# Patient Record
Sex: Female | Born: 1975 | Race: Black or African American | Hispanic: No | Marital: Single | Smoking: Never smoker
Health system: Southern US, Community
[De-identification: ages and names within clinical notes are randomized; demographics above are authoritative.]

## PROBLEM LIST (undated history)

## (undated) DIAGNOSIS — G4730 Sleep apnea, unspecified: Secondary | ICD-10-CM

## (undated) DIAGNOSIS — H05119 Granuloma of unspecified orbit: Secondary | ICD-10-CM

## (undated) DIAGNOSIS — R87629 Unspecified abnormal cytological findings in specimens from vagina: Secondary | ICD-10-CM

## (undated) DIAGNOSIS — I10 Essential (primary) hypertension: Secondary | ICD-10-CM

## (undated) DIAGNOSIS — T7840XA Allergy, unspecified, initial encounter: Secondary | ICD-10-CM

## (undated) DIAGNOSIS — H547 Unspecified visual loss: Secondary | ICD-10-CM

## (undated) DIAGNOSIS — G43909 Migraine, unspecified, not intractable, without status migrainosus: Secondary | ICD-10-CM

## (undated) HISTORY — PX: TRACHEOSTOMY: SUR1362

## (undated) HISTORY — DX: Migraine, unspecified, not intractable, without status migrainosus: G43.909

## (undated) HISTORY — DX: Allergy, unspecified, initial encounter: T78.40XA

## (undated) HISTORY — DX: Granuloma of unspecified orbit: H05.119

## (undated) HISTORY — PX: CSF SHUNT: SHX92

## (undated) HISTORY — PX: BRAIN SURGERY: SHX531

## (undated) HISTORY — DX: Unspecified abnormal cytological findings in specimens from vagina: R87.629

## (undated) HISTORY — DX: Essential (primary) hypertension: I10

## (undated) HISTORY — DX: Unspecified visual loss: H54.7

## (undated) HISTORY — DX: Sleep apnea, unspecified: G47.30

---

## 2015-05-15 DIAGNOSIS — G932 Benign intracranial hypertension: Secondary | ICD-10-CM | POA: Insufficient documentation

## 2015-08-14 DIAGNOSIS — H5440 Blindness, one eye, unspecified eye: Secondary | ICD-10-CM | POA: Insufficient documentation

## 2015-09-21 DIAGNOSIS — G932 Benign intracranial hypertension: Secondary | ICD-10-CM | POA: Insufficient documentation

## 2015-10-25 DIAGNOSIS — Z982 Presence of cerebrospinal fluid drainage device: Secondary | ICD-10-CM | POA: Insufficient documentation

## 2015-11-21 ENCOUNTER — Ambulatory Visit (INDEPENDENT_AMBULATORY_CARE_PROVIDER_SITE_OTHER): Payer: Self-pay | Admitting: Physician Assistant

## 2015-11-21 ENCOUNTER — Encounter: Payer: Self-pay | Admitting: Physician Assistant

## 2015-11-21 VITALS — BP 138/90 | HR 71 | Temp 97.0°F | Ht 70.0 in | Wt 362.1 lb

## 2015-11-21 DIAGNOSIS — J309 Allergic rhinitis, unspecified: Secondary | ICD-10-CM

## 2015-11-21 DIAGNOSIS — J0100 Acute maxillary sinusitis, unspecified: Secondary | ICD-10-CM

## 2015-11-21 DIAGNOSIS — G932 Benign intracranial hypertension: Secondary | ICD-10-CM

## 2015-11-21 DIAGNOSIS — Z982 Presence of cerebrospinal fluid drainage device: Secondary | ICD-10-CM

## 2015-11-21 DIAGNOSIS — G43809 Other migraine, not intractable, without status migrainosus: Secondary | ICD-10-CM

## 2015-11-21 DIAGNOSIS — Z6841 Body Mass Index (BMI) 40.0 and over, adult: Secondary | ICD-10-CM

## 2015-11-21 DIAGNOSIS — I10 Essential (primary) hypertension: Secondary | ICD-10-CM

## 2015-11-21 MED ORDER — PROPRANOLOL HCL ER BEADS 120 MG PO CP24: 120.0000 mg | ORAL_CAPSULE | Freq: Every day | ORAL | 11 refills | Status: DC

## 2015-11-21 MED ORDER — AMITRIPTYLINE HCL 50 MG PO TABS: 50.0000 mg | ORAL_TABLET | Freq: Every day | ORAL | 11 refills | Status: DC

## 2015-11-21 MED ORDER — AMOXICILLIN 500 MG PO CAPS: 1000.0000 mg | ORAL_CAPSULE | Freq: Two times a day (BID) | ORAL | 0 refills | Status: DC

## 2015-11-21 NOTE — Progress Notes (Signed)
Oct 4 surg and neuro   BP 138/90   Pulse 71   Temp 97 F (36.1 C) (Oral)   Ht 5\' 10"  (1.778 m)   Wt (!) 362 lb 2 oz (164.3 kg)   BMI 51.96 kg/m    Subjective:    Patient ID: Vanessa [name], female    DOB: [birth year], 40 y.o.   MRN: [id number]  Vanessa [name] is a 40 y.o. female presenting on 11/21/2015 for Medication refills and Discuss recent surgery (shunt placement)  HPI Patient here to be established as new patient at [facility].  This patient is known to me from [facility].  She returns to the neurosurgeon and neurologist on October 4 for postop visit. Shunt was place October 24, 2015. Feeling much better overall. Blood pressure was very good upon leaving the hospital.  Her headaches have decreased in number and in severity. She states that she still has some discomfort over the healing lesions on her scalp from the surgery.   She is having pain over the left maxillary sinus N/A. She had a tooth removed several weeks ago the pain had improved. She is having a blocked feeling in that side and up to her ear. She denies any other fever chills nausea vomiting. She denies any cough or wheezing. She has no nausea vomiting or diarrhea.  All of her medical conditions and medications are reviewed today and brought up-to-date. We'll refill anything that she needs refilling at this time. There are no other complaints  Relevant past medical, surgical, family and social history reviewed and updated as indicated. Interim medical history since our last visit reviewed. Allergies and medications reviewed and updated.   Data reviewed from any sources in EPIC.  Review of Systems  Constitutional: Negative.  Negative for activity change, fatigue and fever.  HENT: Positive for facial swelling, sinus pressure and sore throat.   Eyes: Negative.   Respiratory: Negative.  Negative for cough.   Cardiovascular: Negative.  Negative for chest pain.  Gastrointestinal:  Negative.  Negative for abdominal pain.  Endocrine: Negative.   Genitourinary: Negative.  Negative for dysuria.  Musculoskeletal: Negative.   Skin: Negative.   Neurological: Negative.     Per HPI unless specifically indicated above  Social History   Social History  . Marital status: Single    Spouse name: N/A  . Number of children: N/A  . Years of education: N/A   Occupational History  . Not on file.   Social History Main Topics  . Smoking status: Never Smoker  . Smokeless tobacco: Never Used  . Alcohol use No  . Drug use: No  . Sexual activity: Not on file   Other Topics Concern  . Not on file   Social History Narrative  . No narrative on file    Past Surgical History:  Procedure Laterality Date  . CSF SHUNT      Family History  Problem Relation Age of Onset  . Pulmonary Hypertension Mother   . Congestive Heart Failure Mother   . Hypertension Sister       Medication List       Accurate as of 11/21/15  3:15 PM. Always use your most recent med list.          acetaZOLAMIDE 250 MG tablet Commonly known as:  DIAMOX Take 750 mg by mouth 2 (two) times daily.   amitriptyline 50 MG tablet Commonly known as:  ELAVIL Take 1 tablet (50 mg total) by mouth daily.  amLODipine 10 MG tablet Commonly known as:  NORVASC Take 10 mg by mouth daily.   amoxicillin 500 MG capsule Commonly known as:  AMOXIL Take 2 capsules (1,000 mg total) by mouth 2 (two) times daily.   diclofenac 50 MG tablet Commonly known as:  CATAFLAM Take 50 mg by mouth 3 (three) times daily as needed.   gabapentin 300 MG capsule Commonly known as:  NEURONTIN Take 300 mg by mouth at bedtime.   lisinopril 40 MG tablet Commonly known as:  PRINIVIL,ZESTRIL Take 40 mg by mouth daily.   loratadine 10 MG tablet Commonly known as:  CLARITIN Take 10 mg by mouth daily.   propranolol 120 MG 24 hr capsule Commonly known as:  INNOPRAN XL Take 1 capsule (120 mg total) by mouth daily.     zaleplon 5 MG capsule Commonly known as:  SONATA Take 5 mg by mouth at bedtime as needed for sleep.          Objective:    BP 138/90   Pulse 71   Temp 97 F (36.1 C) (Oral)   Ht 5\' 10"  (1.778 m)   Wt (!) 362 lb 2 oz (164.3 kg)   BMI 51.96 kg/m   Allergies  Allergen Reactions  . Peanut Oil Anaphylaxis and Other (See Comments)    update  . Iodine-131 Other (See Comments)    update   Wt Readings from Last 3 Encounters:  11/21/15 (!) 362 lb 2 oz (164.3 kg)    Physical Exam  Constitutional: She is oriented to person, place, and time. She appears well-developed and well-nourished.  HENT:  Head: Normocephalic and atraumatic.  Right Ear: Tympanic membrane and external ear normal. No middle ear effusion.  Left Ear: Tympanic membrane and external ear normal.  No middle ear effusion.  Nose: Mucosal edema present. No rhinorrhea. Right sinus exhibits no maxillary sinus tenderness. Left sinus exhibits maxillary sinus tenderness.  Mouth/Throat: Uvula is midline. Posterior oropharyngeal erythema present.  Eyes: Conjunctivae and EOM are normal. Pupils are equal, round, and reactive to light. Right eye exhibits no discharge. Left eye exhibits no discharge.  Neck: Normal range of motion.  Cardiovascular: Normal rate, regular rhythm and normal heart sounds.   Pulmonary/Chest: Effort normal and breath sounds normal. No respiratory distress. She has no wheezes.  Abdominal: Soft.  Lymphadenopathy:    She has no cervical adenopathy.  Neurological: She is alert and oriented to person, place, and time.  Skin: Skin is warm and dry.  Psychiatric: She has a normal mood and affect.  Nursing note and vitals reviewed.       Assessment & Plan:   1. IIH (idiopathic intracranial hypertension) Continue to care with her neurologist and neurosurgeon.  2. S/P VP shunt  3. Essential hypertension DASH diet information is given. Continue all maintenance medications  4. Allergic rhinitis,  unspecified allergic rhinitis type  5. Acute maxillary sinusitis, recurrence not specified - amoxicillin (AMOXIL) 500 MG capsule; Take 2 capsules (1,000 mg total) by mouth 2 (two) times daily.  Dispense: 40 capsule; Refill: 0  6. Other migraine without status migrainosus, not intractable - propranolol (INNOPRAN XL) 120 MG 24 hr capsule; Take 1 capsule (120 mg total) by mouth daily.  Dispense: 30 capsule; Refill: 11 - amitriptyline (ELAVIL) 50 MG tablet; Take 1 tablet (50 mg total) by mouth daily.  Dispense: 30 tablet; Refill: 11  7. Morbid obesity with BMI of 50.0-59.9, adult (HCC) DASH diet information is given   Continue all other maintenance medications  as listed above. Educational handout given for DASH diet  Follow up plan: Return in about 3 months (around 02/20/2016) for recheck.  Terald Sleeper PA-C [facility] [address]  [place], [place] [address] [phone]   11/21/2015, 3:15 PM

## 2015-11-21 NOTE — Patient Instructions (Signed)
DASH Eating Plan  DASH stands for "Dietary Approaches to Stop Hypertension." The DASH eating plan is a healthy eating plan that has been shown to reduce high blood pressure (hypertension). Additional health benefits may include reducing the risk of type 2 diabetes mellitus, heart disease, and stroke. The DASH eating plan may also help with weight loss.  WHAT DO I NEED TO KNOW ABOUT THE DASH EATING PLAN?  For the DASH eating plan, you will follow these general guidelines:  · Choose foods with a percent daily value for sodium of less than 5% (as listed on the food label).  · Use salt-free seasonings or herbs instead of table salt or sea salt.  · Check with your health care provider or pharmacist before using salt substitutes.  · Eat lower-sodium products, often labeled as "lower sodium" or "no salt added."  · Eat fresh foods.  · Eat more vegetables, fruits, and low-fat dairy products.  · Choose whole grains. Look for the word "whole" as the first word in the ingredient list.  · Choose fish and skinless chicken or turkey more often than red meat. Limit fish, poultry, and meat to 6 oz (170 g) each day.  · Limit sweets, desserts, sugars, and sugary drinks.  · Choose heart-healthy fats.  · Limit cheese to 1 oz (28 g) per day.  · Eat more home-cooked food and less restaurant, buffet, and fast food.  · Limit fried foods.  · Cook foods using methods other than frying.  · Limit canned vegetables. If you do use them, rinse them well to decrease the sodium.  · When eating at a restaurant, ask that your food be prepared with less salt, or no salt if possible.  WHAT FOODS CAN I EAT?  Seek help from a dietitian for individual calorie needs.  Grains  Whole grain or whole wheat bread. Brown rice. Whole grain or whole wheat pasta. Quinoa, bulgur, and whole grain cereals. Low-sodium cereals. Corn or whole wheat flour tortillas. Whole grain cornbread. Whole grain crackers. Low-sodium crackers.  Vegetables  Fresh or frozen vegetables  (raw, steamed, roasted, or grilled). Low-sodium or reduced-sodium tomato and vegetable juices. Low-sodium or reduced-sodium tomato sauce and paste. Low-sodium or reduced-sodium canned vegetables.   Fruits  All fresh, canned (in natural juice), or frozen fruits.  Meat and Other Protein Products  Ground beef (85% or leaner), grass-fed beef, or beef trimmed of fat. Skinless chicken or turkey. Ground chicken or turkey. Pork trimmed of fat. All fish and seafood. Eggs. Dried beans, peas, or lentils. Unsalted nuts and seeds. Unsalted canned beans.  Dairy  Low-fat dairy products, such as skim or 1% milk, 2% or reduced-fat cheeses, low-fat ricotta or cottage cheese, or plain low-fat yogurt. Low-sodium or reduced-sodium cheeses.  Fats and Oils  Tub margarines without trans fats. Light or reduced-fat mayonnaise and salad dressings (reduced sodium). Avocado. Safflower, olive, or canola oils. Natural peanut or almond butter.  Other  Unsalted popcorn and pretzels.  The items listed above may not be a complete list of recommended foods or beverages. Contact your dietitian for more options.  WHAT FOODS ARE NOT RECOMMENDED?  Grains  White bread. White pasta. White rice. Refined cornbread. Bagels and croissants. Crackers that contain trans fat.  Vegetables  Creamed or fried vegetables. Vegetables in a cheese sauce. Regular canned vegetables. Regular canned tomato sauce and paste. Regular tomato and vegetable juices.  Fruits  Dried fruits. Canned fruit in light or heavy syrup. Fruit juice.  Meat and Other Protein   Products  Fatty cuts of meat. Ribs, chicken wings, bacon, sausage, bologna, salami, chitterlings, fatback, hot dogs, bratwurst, and packaged luncheon meats. Salted nuts and seeds. Canned beans with salt.  Dairy  Whole or 2% milk, cream, half-and-half, and cream cheese. Whole-fat or sweetened yogurt. Full-fat cheeses or blue cheese. Nondairy creamers and whipped toppings. Processed cheese, cheese spreads, or cheese  curds.  Condiments  Onion and garlic salt, seasoned salt, table salt, and sea salt. Canned and packaged gravies. Worcestershire sauce. Tartar sauce. Barbecue sauce. Teriyaki sauce. Soy sauce, including reduced sodium. Steak sauce. Fish sauce. Oyster sauce. Cocktail sauce. Horseradish. Ketchup and mustard. Meat flavorings and tenderizers. Bouillon cubes. Hot sauce. Tabasco sauce. Marinades. Taco seasonings. Relishes.  Fats and Oils  Butter, stick margarine, lard, shortening, ghee, and bacon fat. Coconut, palm kernel, or palm oils. Regular salad dressings.  Other  Pickles and olives. Salted popcorn and pretzels.  The items listed above may not be a complete list of foods and beverages to avoid. Contact your dietitian for more information.  WHERE CAN I FIND MORE INFORMATION?  National Heart, Lung, and Blood Institute: www.nhlbi.nih.gov/health/health-topics/topics/dash/     This information is not intended to replace advice given to you by your health care provider. Make sure you discuss any questions you have with your health care provider.     Document Released: 01/31/2011 Document Revised: 03/04/2014 Document Reviewed: 12/16/2012  Elsevier Interactive Patient Education ©2016 Elsevier Inc.

## 2015-12-05 ENCOUNTER — Telehealth: Payer: Self-pay | Admitting: Physician Assistant

## 2015-12-05 DIAGNOSIS — J0100 Acute maxillary sinusitis, unspecified: Secondary | ICD-10-CM

## 2015-12-05 MED ORDER — AMOXICILLIN 500 MG PO CAPS: 1000.0000 mg | ORAL_CAPSULE | Freq: Two times a day (BID) | ORAL | 0 refills | Status: DC

## 2015-12-05 NOTE — Telephone Encounter (Signed)
Patient wants to know if there is something she can take for the cough along with amoxicillin. Patient states she has tried Robitussin and mucinex and it has not helped. Please advise

## 2015-12-05 NOTE — Telephone Encounter (Signed)
Amoxicillin 500 mg 2 tabs po BID 10 days, #40.

## 2015-12-05 NOTE — Telephone Encounter (Signed)
Patient aware that Rx has been sent to pharmacy 

## 2015-12-05 NOTE — Telephone Encounter (Signed)
Patient called with complaints of cough, nasal congestion and dyspnea.  Patient has taken robitussin, dayquil and Nyquil with no relief.  Patient would like for something to be sent to pharmacy in [facility]

## 2016-02-21 ENCOUNTER — Ambulatory Visit: Payer: Self-pay | Admitting: Physician Assistant

## 2016-02-28 ENCOUNTER — Ambulatory Visit: Payer: Self-pay | Admitting: Physician Assistant

## 2016-03-04 ENCOUNTER — Encounter: Payer: Self-pay | Admitting: Physician Assistant

## 2016-03-04 ENCOUNTER — Ambulatory Visit (INDEPENDENT_AMBULATORY_CARE_PROVIDER_SITE_OTHER): Payer: Self-pay | Admitting: Physician Assistant

## 2016-03-04 VITALS — BP 130/79 | HR 87 | Temp 98.0°F | Ht 70.0 in | Wt 347.0 lb

## 2016-03-04 DIAGNOSIS — E282 Polycystic ovarian syndrome: Secondary | ICD-10-CM | POA: Insufficient documentation

## 2016-03-04 DIAGNOSIS — G4459 Other complicated headache syndrome: Secondary | ICD-10-CM

## 2016-03-04 DIAGNOSIS — I10 Essential (primary) hypertension: Secondary | ICD-10-CM | POA: Insufficient documentation

## 2016-03-04 DIAGNOSIS — E1159 Type 2 diabetes mellitus with other circulatory complications: Secondary | ICD-10-CM | POA: Insufficient documentation

## 2016-03-04 DIAGNOSIS — G932 Benign intracranial hypertension: Secondary | ICD-10-CM

## 2016-03-04 MED ORDER — OXYCODONE HCL 5 MG PO TABS: 5.0000 mg | ORAL_TABLET | Freq: Four times a day (QID) | ORAL | 0 refills | Status: AC | PRN

## 2016-03-04 MED ORDER — GABAPENTIN 300 MG PO CAPS: 300.0000 mg | ORAL_CAPSULE | Freq: Every day | ORAL | 11 refills | Status: DC

## 2016-03-04 MED ORDER — AMLODIPINE BESYLATE 10 MG PO TABS: 10.0000 mg | ORAL_TABLET | Freq: Every day | ORAL | 11 refills | Status: DC

## 2016-03-04 MED ORDER — METFORMIN HCL 500 MG PO TABS: 500.0000 mg | ORAL_TABLET | Freq: Two times a day (BID) | ORAL | 11 refills | Status: DC

## 2016-03-04 MED ORDER — LORATADINE 10 MG PO TABS: 10.0000 mg | ORAL_TABLET | Freq: Every day | ORAL | 11 refills | Status: DC

## 2016-03-04 NOTE — Patient Instructions (Signed)
DASH Eating Plan DASH stands for "Dietary Approaches to Stop Hypertension." The DASH eating plan is a healthy eating plan that has been shown to reduce high blood pressure (hypertension). Additional health benefits may include reducing the risk of type 2 diabetes mellitus, heart disease, and stroke. The DASH eating plan may also help with weight loss. What do I need to know about the DASH eating plan? For the DASH eating plan, you will follow these general guidelines:  Choose foods with less than 150 milligrams of sodium per serving (as listed on the food label).  Use salt-free seasonings or herbs instead of table salt or sea salt.  Check with your health care provider or pharmacist before using salt substitutes.  Eat lower-sodium products. These are often labeled as "low-sodium" or "no salt added."  Eat fresh foods. Avoid eating a lot of canned foods.  Eat more vegetables, fruits, and low-fat dairy products.  Choose whole grains. Look for the word "whole" as the first word in the ingredient list.  Choose fish and skinless chicken or turkey more often than red meat. Limit fish, poultry, and meat to 6 oz (170 g) each day.  Limit sweets, desserts, sugars, and sugary drinks.  Choose heart-healthy fats.  Eat more home-cooked food and less restaurant, buffet, and fast food.  Limit fried foods.  Do not fry foods. Cook foods using methods such as baking, boiling, grilling, and broiling instead.  When eating at a restaurant, ask that your food be prepared with less salt, or no salt if possible. What foods can I eat? Seek help from a dietitian for individual calorie needs. Grains  Whole grain or whole wheat bread. Brown rice. Whole grain or whole wheat pasta. Quinoa, bulgur, and whole grain cereals. Low-sodium cereals. Corn or whole wheat flour tortillas. Whole grain cornbread. Whole grain crackers. Low-sodium crackers. Vegetables  Fresh or frozen vegetables (raw, steamed, roasted, or  grilled). Low-sodium or reduced-sodium tomato and vegetable juices. Low-sodium or reduced-sodium tomato sauce and paste. Low-sodium or reduced-sodium canned vegetables. Fruits  All fresh, canned (in natural juice), or frozen fruits. Meat and Other Protein Products  Ground beef (85% or leaner), grass-fed beef, or beef trimmed of fat. Skinless chicken or turkey. Ground chicken or turkey. Pork trimmed of fat. All fish and seafood. Eggs. Dried beans, peas, or lentils. Unsalted nuts and seeds. Unsalted canned beans. Dairy  Low-fat dairy products, such as skim or 1% milk, 2% or reduced-fat cheeses, low-fat ricotta or cottage cheese, or plain low-fat yogurt. Low-sodium or reduced-sodium cheeses. Fats and Oils  Tub margarines without trans fats. Light or reduced-fat mayonnaise and salad dressings (reduced sodium). Avocado. Safflower, olive, or canola oils. Natural peanut or almond butter. Other  Unsalted popcorn and pretzels. The items listed above may not be a complete list of recommended foods or beverages. Contact your dietitian for more options.  What foods are not recommended? Grains  White bread. White pasta. White rice. Refined cornbread. Bagels and croissants. Crackers that contain trans fat. Vegetables  Creamed or fried vegetables. Vegetables in a cheese sauce. Regular canned vegetables. Regular canned tomato sauce and paste. Regular tomato and vegetable juices. Fruits  Canned fruit in light or heavy syrup. Fruit juice. Meat and Other Protein Products  Fatty cuts of meat. Ribs, chicken wings, bacon, sausage, bologna, salami, chitterlings, fatback, hot dogs, bratwurst, and packaged luncheon meats. Salted nuts and seeds. Canned beans with salt. Dairy  Whole or 2% milk, cream, half-and-half, and cream cheese. Whole-fat or sweetened yogurt. Full-fat cheeses   or blue cheese. Nondairy creamers and whipped toppings. Processed cheese, cheese spreads, or cheese curds. Condiments  Onion and garlic  salt, seasoned salt, table salt, and sea salt. Canned and packaged gravies. Worcestershire sauce. Tartar sauce. Barbecue sauce. Teriyaki sauce. Soy sauce, including reduced sodium. Steak sauce. Fish sauce. Oyster sauce. Cocktail sauce. Horseradish. Ketchup and mustard. Meat flavorings and tenderizers. Bouillon cubes. Hot sauce. Tabasco sauce. Marinades. Taco seasonings. Relishes. Fats and Oils  Butter, stick margarine, lard, shortening, ghee, and bacon fat. Coconut, palm kernel, or palm oils. Regular salad dressings. Other  Pickles and olives. Salted popcorn and pretzels. The items listed above may not be a complete list of foods and beverages to avoid. Contact your dietitian for more information.  Where can I find more information? National Heart, Lung, and Blood Institute: www.nhlbi.nih.gov/health/health-topics/topics/dash/ This information is not intended to replace advice given to you by your health care provider. Make sure you discuss any questions you have with your health care provider. Document Released: 01/31/2011 Document Revised: 07/20/2015 Document Reviewed: 12/16/2012 Elsevier Interactive Patient Education  2017 Elsevier Inc.  

## 2016-03-04 NOTE — Progress Notes (Signed)
BP 130/79   Pulse 87   Temp 98 F (36.7 C) (Oral)   Ht 5\' 10"  (1.778 m)   Wt (!) 347 lb (157.4 kg)   BMI 49.79 kg/m    Subjective:    Patient ID: Vanessa [name], female    DOB: Dec 08, 1975, 41 y.o.   MRN: [id number]  HPI: Vanessa [name] is a 41 y.o. female presenting on 03/04/2016 for Follow-up (Since seen last in hospital twice )  This patient comes in for periodic recheck on medications and conditions. All medications are reviewed today. There are no reports of any problems with the medications. All of the medical conditions are reviewed and updated.  Lab work is reviewed and will be ordered as medically necessary.   Patient developed a Staphylococcus infection in the shunt from her head. She has this in place for her pseudotumor cerebri and extra fluid.  She had to be hospitalized for it and is still on Ancef through a PICC line. She has 1 more dose to take. After this is commonly plan to replace the shunt. She'll be seen her neurologist and neurosurgeon in coming weeks. All of her medications are reviewed today.  Relevant past medical, surgical, family and social history reviewed and updated as indicated. Allergies and medications reviewed and updated.  Past Medical History:  Diagnosis Date  . Hypertension   . Migraines   . Pseudotumor (inflammatory) of orbit   . Vision loss     Past Surgical History:  Procedure Laterality Date  . CSF SHUNT      Review of Systems  Constitutional: Positive for fatigue. Negative for activity change and fever.  HENT: Negative.   Eyes: Negative.   Respiratory: Negative.  Negative for cough.   Cardiovascular: Negative.  Negative for chest pain.  Gastrointestinal: Negative.  Negative for abdominal pain.  Endocrine: Negative.   Genitourinary: Negative.  Negative for dysuria.  Musculoskeletal: Negative.   Skin: Negative.   Neurological: Positive for headaches. Negative for weakness.  Hematological: Negative.   Psychiatric/Behavioral:  Negative.     Allergies as of 03/04/2016      Reactions   Metrizamide Other (See Comments), Itching, Photosensitivity, Shortness Of Breath, Swelling   update Migraine instantly   Peanut Oil Anaphylaxis, Other (See Comments)   update   Iodine-131 Other (See Comments)   update      Medication List       Accurate as of 03/04/16  9:24 PM. Always use your most recent med list.          acetaZOLAMIDE 250 MG tablet Commonly known as:  DIAMOX Take 750 mg by mouth 2 (two) times daily.   amitriptyline 50 MG tablet Commonly known as:  ELAVIL Take 1 tablet (50 mg total) by mouth daily.   amLODipine 10 MG tablet Commonly known as:  NORVASC Take 1 tablet (10 mg total) by mouth daily.   ceFAZolin 1 g injection Commonly known as:  ANCEF Inject into the vein.   diphenhydrAMINE 25 mg capsule Commonly known as:  BENADRYL Take by mouth.   gabapentin 300 MG capsule Commonly known as:  NEURONTIN Take 1 capsule (300 mg total) by mouth at bedtime.   lisinopril 40 MG tablet Commonly known as:  PRINIVIL,ZESTRIL Take 40 mg by mouth daily.   loratadine 10 MG tablet Commonly known as:  CLARITIN Take 1 tablet (10 mg total) by mouth daily.   metFORMIN 500 MG tablet Commonly known as:  GLUCOPHAGE Take 1 tablet (500 mg total) by mouth  2 (two) times daily with a meal.   ondansetron 4 MG tablet Commonly known as:  ZOFRAN Take by mouth.   oxyCODONE 5 MG immediate release tablet Commonly known as:  Oxy IR/ROXICODONE Take 1 tablet (5 mg total) by mouth every 6 (six) hours as needed for severe pain.   zaleplon 5 MG capsule Commonly known as:  SONATA Take 5 mg by mouth at bedtime as needed for sleep.          Objective:    BP 130/79   Pulse 87   Temp 98 F (36.7 C) (Oral)   Ht 5\' 10"  (1.778 m)   Wt (!) 347 lb (157.4 kg)   BMI 49.79 kg/m   Allergies  Allergen Reactions  . Metrizamide Other (See Comments), Itching, Photosensitivity, Shortness Of Breath and Swelling     update Migraine instantly  . Peanut Oil Anaphylaxis and Other (See Comments)    update  . Iodine-131 Other (See Comments)    update    Physical Exam  Constitutional: She is oriented to person, place, and time. She appears well-developed and well-nourished.  HENT:  Head: Normocephalic and atraumatic.  Right Ear: Tympanic membrane, external ear and ear canal normal.  Left Ear: Tympanic membrane, external ear and ear canal normal.  Nose: Nose normal. No rhinorrhea.  Mouth/Throat: Oropharynx is clear and moist and mucous membranes are normal. No oropharyngeal exudate or posterior oropharyngeal erythema.  Eyes: Conjunctivae and EOM are normal. Pupils are equal, round, and reactive to light.  Neck: Normal range of motion. Neck supple.  Cardiovascular: Normal rate, regular rhythm, normal heart sounds and intact distal pulses.   Pulmonary/Chest: Effort normal and breath sounds normal.  Abdominal: Soft. Bowel sounds are normal.  Neurological: She is alert and oriented to person, place, and time. She has normal reflexes.  Skin: Skin is warm and dry. No rash noted.  Psychiatric: She has a normal mood and affect. Her behavior is normal. Judgment and thought content normal.  Nursing note and vitals reviewed.   No results found for this or any previous visit.    Assessment & Plan:   1. IIH (idiopathic intracranial hypertension)  2. Pseudotumor cerebri  3. Morbid obesity ([place])  4. Essential hypertension - amLODipine (NORVASC) 10 MG tablet; Take 1 tablet (10 mg total) by mouth daily.  Dispense: 30 tablet; Refill: 11  5. Other complicated headache syndrome - ceFAZolin (ANCEF) 1 g injection; Inject into the vein. - ondansetron (ZOFRAN) 4 MG tablet; Take by mouth. - gabapentin (NEURONTIN) 300 MG capsule; Take 1 capsule (300 mg total) by mouth at bedtime.  Dispense: 30 capsule; Refill: 11 - oxyCODONE (OXY IR/ROXICODONE) 5 MG immediate release tablet; Take 1 tablet (5 mg total) by mouth every  6 (six) hours as needed for severe pain.  Dispense: 75 tablet; Refill: 0  6. PCOS (polycystic ovarian syndrome) - metFORMIN (GLUCOPHAGE) 500 MG tablet; Take 1 tablet (500 mg total) by mouth 2 (two) times daily with a meal.  Dispense: 60 tablet; Refill: 11   Continue all other maintenance medications as listed above.  Follow up plan: Return in about 3 months (around 06/02/2016) for recheck.  No orders of the defined types were placed in this encounter.   Educational handout given for migraine  Terald Sleeper PA-C [facility] [address]  [place], [place] [address] [phone]   03/04/2016, 9:24 PM

## 2016-03-19 ENCOUNTER — Ambulatory Visit: Payer: Self-pay | Admitting: Physician Assistant

## 2016-04-24 ENCOUNTER — Ambulatory Visit (INDEPENDENT_AMBULATORY_CARE_PROVIDER_SITE_OTHER): Payer: Self-pay | Admitting: Physician Assistant

## 2016-04-24 ENCOUNTER — Encounter: Payer: Self-pay | Admitting: Physician Assistant

## 2016-04-24 VITALS — BP 152/88 | HR 88 | Temp 97.7°F | Ht 70.0 in | Wt 336.4 lb

## 2016-04-24 DIAGNOSIS — K047 Periapical abscess without sinus: Secondary | ICD-10-CM

## 2016-04-24 DIAGNOSIS — I10 Essential (primary) hypertension: Secondary | ICD-10-CM

## 2016-04-24 DIAGNOSIS — Z930 Tracheostomy status: Secondary | ICD-10-CM

## 2016-04-24 DIAGNOSIS — L03211 Cellulitis of face: Secondary | ICD-10-CM

## 2016-04-24 MED ORDER — OXYCODONE HCL 5 MG PO TABS: 5.0000 mg | ORAL_TABLET | Freq: Four times a day (QID) | ORAL | 0 refills | Status: DC | PRN

## 2016-04-24 NOTE — Patient Instructions (Addendum)
How to Minimize Scarring After Surgery Scarring is a risk of any surgery that involves cutting the skin (an incision). However, every person scars differently. Factors that affect how you scar include:  Which surgery technique was used.  Where the incision was made on your body.  Your overall health.  Your age.  Your skin. You can reduce scarring by following instructions from your health care provider for care at home after surgery.This includes keeping your incision clean, moist, and protected from the sun. How to minimize scarring after surgery Right After Surgery   Follow instructions from your health care provider about how to take care of your incision. Make sure you:  Wash your hands with soap and water before you change your bandage (dressing). If soap and water are not available, use hand sanitizer.  Change your dressing one time each dayor as told by your health care provider.  Keep your incision clean by gently washing it with soap and water as told by your health care provider. This will help to prevent infection.  If directed, apply antibiotic ointment or petroleum jelly to the incision to keep it moist until it heals fully. You may need to moisten two times per day for about 2 weeks.  Leave stitches (sutures), skin glue, or adhesive strips in place. These skin closures may need to be in place for 2 weeks or longer. If adhesive strip edges start to loosen and curl up, you may trim the loose edges. Do not remove adhesive strips completely unless your health care provider tells you to do that.  Avoid touching or manipulating your incision unless needed. Wash your hands thoroughly before and after you touch your incision.  Get sutures taken out at the scheduled time.  Follow all restrictions, such as limits on exercise or work. What you should do and should not do will depend on where your incision is located. After Your Skin Has Healed   Keep your scar protected from  the sun. Cover the scar with sunscreen that has an SPF (sun protection factor) of 30 or higher. Do not put sunscreen on your scar until it has healed.  Gently massage the scar using a circular motion. This will help to minimize the appearance of the scar. Do this only after the incision has closed and all of the sutures have been removed.  Remember that the scar may appear lighter or darker than your normal skin color. This difference in color should even out with time.  If your scar does not fade or go away with time and you do not like how it looks, consider talking with a plastic surgeon or a dermatologist.  Keep all follow-up visits as told by your health care provider. This is important. Contact a health care provider if:  Your sutures come out before your health care provider said they would.  You have more redness, swelling, or pain around your incision.  You have more fluid or blood coming from your incision.  Your incision feels warm to the touch.  You have pus or a bad smell coming from your incision.  You have a fever.  You think that you are having a reaction to the antibiotic ointment. Get help right away if:  You have bleeding from the incision that does not stop. This information is not intended to replace advice given to you by your health care provider. Make sure you discuss any questions you have with your health care provider. Document Released: 08/01/2009 Document Revised:  07/20/2015 Document Reviewed: 09/14/2014 Elsevier Interactive Patient Education  2017 [employer]. Scarring in

## 2016-04-29 DIAGNOSIS — L03211 Cellulitis of face: Secondary | ICD-10-CM | POA: Insufficient documentation

## 2016-04-29 DIAGNOSIS — Z930 Tracheostomy status: Secondary | ICD-10-CM | POA: Insufficient documentation

## 2016-04-29 NOTE — Progress Notes (Signed)
BP (!) 152/88   Pulse 88   Temp 97.7 F (36.5 C) (Oral)   Ht 5\' 10"  (1.778 m)   Wt (!) 336 lb 6.4 oz (152.6 kg)   BMI 48.27 kg/m    Subjective:    Patient ID: Vanessa [name], female    DOB: 1975-09-28, 41 y.o.   MRN: [id number]  HPI: Vanessa [name] is a 41 y.o. female presenting on 04/24/2016 for Hospitalization Follow-up (Patient was in [facility] for 13 days due to an infected wisdom tooth that was causing facial swelling. Patient had to have a trach for 1 week. ) Patient reports she is doing much better since her admission to [facility]. She is still following up with infectious disease and pulmonology. She states that her trach is supposed to heal on its own. She has completed all of her medications as directed. In addition she will  be seen her neurologist in follow-up very soon too.   Relevant past medical, surgical, family and social history reviewed and updated as indicated. Allergies and medications reviewed and updated.  Past Medical History:  Diagnosis Date  . Hypertension   . Migraines   . Pseudotumor (inflammatory) of orbit   . Vision loss     Past Surgical History:  Procedure Laterality Date  . CSF SHUNT      Review of Systems  Constitutional: Negative.  Negative for activity change, fatigue and fever.  HENT: Negative for trouble swallowing and voice change.   Eyes: Negative.   Respiratory: Negative.  Negative for cough, choking, chest tightness and wheezing.   Cardiovascular: Negative.  Negative for chest pain and palpitations.  Gastrointestinal: Negative.  Negative for abdominal pain.  Endocrine: Negative.   Genitourinary: Negative.  Negative for dysuria.  Musculoskeletal: Negative.   Skin: Negative.   Neurological: Negative.     Allergies as of 04/24/2016      Reactions   Metrizamide Other (See Comments), Itching, Photosensitivity, Shortness Of Breath, Swelling   update Migraine instantly   Peanut Oil Anaphylaxis, Other (See Comments)   update     Iodine-131 Other (See Comments)   update      Medication List       Accurate as of 04/24/16 11:59 PM. Always use your most recent med list.          acetaZOLAMIDE 250 MG tablet Commonly known as:  DIAMOX Take 750 mg by mouth 2 (two) times daily.   amitriptyline 50 MG tablet Commonly known as:  ELAVIL Take 1 tablet (50 mg total) by mouth daily.   amLODipine 10 MG tablet Commonly known as:  NORVASC Take 1 tablet (10 mg total) by mouth daily.   diphenhydrAMINE 25 mg capsule Commonly known as:  BENADRYL Take by mouth.   gabapentin 300 MG capsule Commonly known as:  NEURONTIN Take 1 capsule (300 mg total) by mouth at bedtime.   lisinopril 40 MG tablet Commonly known as:  PRINIVIL,ZESTRIL Take 40 mg by mouth daily.   loratadine 10 MG tablet Commonly known as:  CLARITIN Take 1 tablet (10 mg total) by mouth daily.   metFORMIN 500 MG tablet Commonly known as:  GLUCOPHAGE Take 1 tablet (500 mg total) by mouth 2 (two) times daily with a meal.   ondansetron 4 MG tablet Commonly known as:  ZOFRAN Take by mouth.   oxyCODONE 5 MG immediate release tablet Commonly known as:  ROXICODONE Take 1 tablet (5 mg total) by mouth every 6 (six) hours as needed for severe pain.  zaleplon 5 MG capsule Commonly known as:  SONATA Take 5 mg by mouth at bedtime as needed for sleep.          Objective:    BP (!) 152/88   Pulse 88   Temp 97.7 F (36.5 C) (Oral)   Ht 5\' 10"  (1.778 m)   Wt (!) 336 lb 6.4 oz (152.6 kg)   BMI 48.27 kg/m   Allergies  Allergen Reactions  . Metrizamide Other (See Comments), Itching, Photosensitivity, Shortness Of Breath and Swelling    update Migraine instantly  . Peanut Oil Anaphylaxis and Other (See Comments)    update  . Iodine-131 Other (See Comments)    update    Physical Exam  Constitutional: She is oriented to person, place, and time. She appears well-developed and well-nourished.  HENT:  Head: Normocephalic and atraumatic.     Tracheostomy to close on its own  Eyes: Conjunctivae and EOM are normal. Pupils are equal, round, and reactive to light.  Cardiovascular: Normal rate, regular rhythm, normal heart sounds and intact distal pulses.   Pulmonary/Chest: Effort normal and breath sounds normal.  Abdominal: Soft. Bowel sounds are normal.  Neurological: She is alert and oriented to person, place, and time. She has normal reflexes.  Skin: Skin is warm and dry. No rash noted.  Psychiatric: She has a normal mood and affect. Her behavior is normal. Judgment and thought content normal.    No results found for this or any previous visit.    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 Dental abscess Complete course of antibiotic  3. Cellulitis diffuse, face See above  4. Essential hypertension Continue medications   Continue all other maintenance medications as listed above.  Follow up plan: Return in about 6 months (around 10/22/2016) for recheck.  Educational handout given for scarring information  Terald Sleeper PA-C [place] [address]  [place], [place] [address] [phone]   04/29/2016, 8:16 AM

## 2016-06-17 ENCOUNTER — Ambulatory Visit (INDEPENDENT_AMBULATORY_CARE_PROVIDER_SITE_OTHER): Payer: Self-pay | Admitting: Physician Assistant

## 2016-06-17 ENCOUNTER — Encounter: Payer: Self-pay | Admitting: Physician Assistant

## 2016-06-17 VITALS — BP 149/86 | HR 88 | Temp 97.9°F | Ht 70.0 in | Wt 349.0 lb

## 2016-06-17 DIAGNOSIS — E8881 Metabolic syndrome: Secondary | ICD-10-CM | POA: Insufficient documentation

## 2016-06-17 DIAGNOSIS — G932 Benign intracranial hypertension: Secondary | ICD-10-CM

## 2016-06-17 DIAGNOSIS — I10 Essential (primary) hypertension: Secondary | ICD-10-CM

## 2016-06-17 DIAGNOSIS — Z6841 Body Mass Index (BMI) 40.0 and over, adult: Secondary | ICD-10-CM

## 2016-06-17 MED ORDER — LISINOPRIL 20 MG PO TABS: 40.0000 mg | ORAL_TABLET | Freq: Every day | ORAL | 3 refills | Status: DC

## 2016-06-17 MED ORDER — OXYCODONE HCL 5 MG PO TABS: 5.0000 mg | ORAL_TABLET | Freq: Four times a day (QID) | ORAL | 0 refills | Status: DC | PRN

## 2016-06-17 MED ORDER — POTASSIUM CHLORIDE CRYS ER 20 MEQ PO TBCR: 20.0000 meq | EXTENDED_RELEASE_TABLET | Freq: Every day | ORAL | 3 refills | Status: DC

## 2016-06-17 NOTE — Patient Instructions (Signed)
Idiopathic Intracranial Hypertension Idiopathic intracranial hypertension (IIH) is a condition that increases pressure around the brain. The fluid that surrounds the brain and spinal cord (cerebrospinal fluid, CSF) increases and causes the pressure. Idiopathic means that the cause of this condition is not known. IIH affects the brain and spinal cord (is a neurological disorder). If this condition is not treated, it can cause vision loss or blindness. What increases the risk? You are more likely to develop this condition if:  You are severely overweight (obese).  You are a woman who has not gone through menopause.  You take certain medicines, such as birth control or steroids.  What are the signs or symptoms? Symptoms of IIH include:  Headaches. This is the most common symptom.  Pain in the shoulders or neck.  Nausea and vomiting.  A "rushing water" or pulsing sound within the ears (pulsatile tinnitus).  Double vision.  Blurred vision.  Brief episodes of complete vision loss.  How is this diagnosed? This condition may be diagnosed based on:  Your symptoms.  Your medical history.  CT scan of the brain.  MRI of the brain.  Magnetic resonance venogram (MRV) to check veins in the brain.  Diagnostic lumbar puncture. This is a procedure to remove and examine a sample of cerebrospinal fluid. This procedure can determine whether too much fluid may be causing IIH.  A thorough eye exam to check for swelling or nerve damage in the eyes.  How is this treated? Treatment for this condition depends on your symptoms. The goal of treatment is to decrease the pressure around your brain. Common treatments include:  Medicines to decrease the production of spinal fluid and lower the pressure within your skull.  Medicines to prevent or treat headaches.  Surgery to place drains (shunts) in your brain to remove excess fluid.  Lumbar puncture to remove excess cerebrospinal  fluid.  Follow these instructions at home:  If you are overweight or obese, work with your health care provider to lose weight.  Take over-the-counter and prescription medicines only as told by your health care provider.  Do not drive or use heavy machinery while taking medicines that can make you sleepy.  Keep all follow-up visits as told by your health care provider. This is important. Contact a health care provider if:  You have changes in your vision, such as: ? Double vision. ? Not being able to see colors (color vision). Get help right away if:  You have any of the following symptoms and they get worse or do not get better. ? Headaches. ? Nausea. ? Vomiting. ? Vision changes or difficulty seeing. Summary  Idiopathic intracranial hypertension (IIH) is a condition that increases pressure around the brain. The cause is not known (is idiopathic).  The most common symptom of IIH is headaches.  Treatment may include medicines or surgery to relieve the pressure on your brain. This information is not intended to replace advice given to you by your health care provider. Make sure you discuss any questions you have with your health care provider. Document Released: 04/22/2001 Document Revised: 01/03/2016 Document Reviewed: 01/03/2016 Elsevier Interactive Patient Education  2017 [employer] Inc.  

## 2016-06-18 NOTE — Progress Notes (Signed)
BP (!) 149/86   Pulse 88   Temp 97.9 F (36.6 C) (Oral)   Ht 5\' 10"  (1.778 m)   Wt (!) 349 lb (158.3 kg)   BMI 50.08 kg/m    Subjective:    Patient ID: Vanessa [name], female    DOB: [birth year], 41 y.o.   MRN: [id number]  HPI: Vanessa [name] is a 41 y.o. 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 Patient has been followed by her neurologist and neurosurgeon. The shunt that was placed for her pseudotumor cerebri has gotten infected and she became septic. It was removed completely to eliminate all the infection. She is planning to have it replaced or have some other procedure performed by them. She'll be seeing them again in about one month. At this time she still had severe headaches. There is still vision loss. Earlier in the month she did have a disability hearing. I do think that she would qualify quite easily due to her complicated medical history..   All medications are reviewed today. There are no reports of any problems with the medications. All of the medical conditions are reviewed and updated.  Lab work is reviewed and will be ordered as medically necessary. There are no new problems reported with today's visit.   Relevant past medical, surgical, family and social history reviewed and updated as indicated. Allergies and medications reviewed and updated.  Past Medical History:  Diagnosis Date  . Hypertension   . Migraines   . Pseudotumor (inflammatory) of orbit   . Vision loss     Past Surgical History:  Procedure Laterality Date  . CSF SHUNT      Review of Systems  Constitutional: Negative.  Negative for activity change, chills, fatigue, fever and unexpected weight change.  HENT: Negative.   Eyes: Positive for pain.  Respiratory: Negative.  Negative for  cough, shortness of breath and wheezing.   Cardiovascular: Negative.  Negative for chest pain and leg swelling.  Gastrointestinal: Negative.  Negative for abdominal pain.  Endocrine: Negative.   Genitourinary: Negative.  Negative for dysuria.  Musculoskeletal: Negative.   Skin: Negative.   Neurological: Positive for headaches. Negative for dizziness, tremors, syncope and speech difficulty.    Allergies as of 06/17/2016      Reactions   Metrizamide Other (See Comments), Itching, Photosensitivity, Shortness Of Breath, Swelling   update Migraine instantly   Peanut Oil Anaphylaxis, Other (See Comments)   update   Iodine-131 Other (See Comments)   update      Medication List       Accurate as of 06/17/16 11:59 PM. Always use your most recent med list.          acetaZOLAMIDE 250 MG tablet Commonly known as:  DIAMOX Take 750 mg by mouth 2 (two) times daily.   amitriptyline 50 MG tablet Commonly known as:  ELAVIL Take 1 tablet (50 mg total) by mouth daily.   amLODipine 10 MG tablet Commonly known as:  NORVASC Take 1 tablet (10 mg total) by mouth daily.   diphenhydrAMINE 25 mg capsule Commonly known as:  BENADRYL Take by mouth.   gabapentin 300 MG capsule Commonly known as:  NEURONTIN Take 1 capsule (300 mg total) by mouth at bedtime.   lisinopril 20 MG tablet Commonly known as:  PRINIVIL,ZESTRIL Take 2 tablets (40 mg total) by mouth  daily.   loratadine 10 MG tablet Commonly known as:  CLARITIN Take 1 tablet (10 mg total) by mouth daily.   metFORMIN 500 MG tablet Commonly known as:  GLUCOPHAGE Take 1 tablet (500 mg total) by mouth 2 (two) times daily with a meal.   ondansetron 4 MG tablet Commonly known as:  ZOFRAN Take by mouth.   oxyCODONE 5 MG immediate release tablet Commonly known as:  ROXICODONE Take 1 tablet (5 mg total) by mouth every 6 (six) hours as needed for severe pain.   potassium chloride SA 20 MEQ tablet Commonly known as:   K-DUR,KLOR-CON Take 1 tablet (20 mEq total) by mouth daily.   zaleplon 5 MG capsule Commonly known as:  SONATA Take 5 mg by mouth at bedtime as needed for sleep.          Objective:    BP (!) 149/86   Pulse 88   Temp 97.9 F (36.6 C) (Oral)   Ht 5\' 10"  (1.778 m)   Wt (!) 349 lb (158.3 kg)   BMI 50.08 kg/m   Allergies  Allergen Reactions  . Metrizamide Other (See Comments), Itching, Photosensitivity, Shortness Of Breath and Swelling    update Migraine instantly  . Peanut Oil Anaphylaxis and Other (See Comments)    update  . Iodine-131 Other (See Comments)    update    Physical Exam  Constitutional: She is oriented to person, place, and time. She appears well-developed and well-nourished.  HENT:  Head: Normocephalic and atraumatic.  Eyes: Conjunctivae and EOM are normal. Pupils are equal, round, and reactive to light.  Cardiovascular: Normal rate, regular rhythm, normal heart sounds and intact distal pulses.   Pulmonary/Chest: Effort normal and breath sounds normal.  Abdominal: Soft. Bowel sounds are normal.  Neurological: She is alert and oriented to person, place, and time. She has normal reflexes.  Skin: Skin is warm and dry. No rash noted.  Psychiatric: She has a normal mood and affect. Her behavior is normal. Judgment and thought content normal.    No results found for this or any previous visit.    Assessment & Plan:   1. Pseudotumor cerebri - oxyCODONE (ROXICODONE) 5 MG immediate release tablet; Take 1 tablet (5 mg total) by mouth every 6 (six) hours as needed for severe pain.  Dispense: 60 tablet; Refill: 0  2. IIH (idiopathic intracranial hypertension)  3. Essential hypertension - lisinopril (PRINIVIL,ZESTRIL) 20 MG tablet; Take 2 tablets (40 mg total) by mouth daily.  Dispense: 180 tablet; Refill: 3 - potassium chloride SA (K-DUR,KLOR-CON) 20 MEQ tablet; Take 1 tablet (20 mEq total) by mouth daily.  Dispense: 30 tablet; Refill: 3  4. Morbid obesity  with BMI of 50.0-59.9, adult ([place])  5. Metabolic syndrome   Current Outpatient Prescriptions:  .  acetaZOLAMIDE (DIAMOX) 250 MG tablet, Take 750 mg by mouth 2 (two) times daily., Disp: , Rfl:  .  amitriptyline (ELAVIL) 50 MG tablet, Take 1 tablet (50 mg total) by mouth daily., Disp: 30 tablet, Rfl: 11 .  amLODipine (NORVASC) 10 MG tablet, Take 1 tablet (10 mg total) by mouth daily., Disp: 30 tablet, Rfl: 11 .  diphenhydrAMINE (BENADRYL) 25 mg capsule, Take by mouth., Disp: , Rfl:  .  gabapentin (NEURONTIN) 300 MG capsule, Take 1 capsule (300 mg total) by mouth at bedtime., Disp: 30 capsule, Rfl: 11 .  lisinopril (PRINIVIL,ZESTRIL) 20 MG tablet, Take 2 tablets (40 mg total) by mouth daily., Disp: 180 tablet, Rfl: 3 .  loratadine (CLARITIN) 10  MG tablet, Take 1 tablet (10 mg total) by mouth daily., Disp: 30 tablet, Rfl: 11 .  metFORMIN (GLUCOPHAGE) 500 MG tablet, Take 1 tablet (500 mg total) by mouth 2 (two) times daily with a meal., Disp: 60 tablet, Rfl: 11 .  ondansetron (ZOFRAN) 4 MG tablet, Take by mouth., Disp: , Rfl:  .  oxyCODONE (ROXICODONE) 5 MG immediate release tablet, Take 1 tablet (5 mg total) by mouth every 6 (six) hours as needed for severe pain., Disp: 60 tablet, Rfl: 0 .  zaleplon (SONATA) 5 MG capsule, Take 5 mg by mouth at bedtime as needed for sleep., Disp: , Rfl:  .  potassium chloride SA (K-DUR,KLOR-CON) 20 MEQ tablet, Take 1 tablet (20 mEq total) by mouth daily., Disp: 30 tablet, Rfl: 3  Continue all other maintenance medications as listed above.  Follow up plan: Return in about 2 months (around 08/17/2016) for recheck.  Educational handout given for bronchitis   Terald Sleeper PA-C [place] [address]  [place], [place] [address] [phone]   06/18/2016, 10:17 AM

## 2016-07-17 ENCOUNTER — Telehealth: Payer: Self-pay | Admitting: Physician Assistant

## 2016-07-17 MED ORDER — DOXYCYCLINE HYCLATE 100 MG PO TABS: 100.0000 mg | ORAL_TABLET | Freq: Two times a day (BID) | ORAL | 0 refills | Status: DC

## 2016-07-17 MED ORDER — BENZONATATE 200 MG PO CAPS: 200.0000 mg | ORAL_CAPSULE | Freq: Two times a day (BID) | ORAL | 0 refills | Status: DC | PRN

## 2016-07-17 NOTE — Telephone Encounter (Signed)
What symptoms do you have? Bronchitis. Wants antibiotic and cough syrup  How long have you been sick? Two days  Have you been seen for this problem? In the past  If your provider decides to give you a prescription, which pharmacy would you like for it to be sent to? [facility] in [place].   Patient informed that this information will be sent to the clinical staff for review and that they should receive a follow up call.

## 2016-07-17 NOTE — Telephone Encounter (Signed)
Patient aware.

## 2016-08-14 ENCOUNTER — Other Ambulatory Visit: Payer: Self-pay | Admitting: Physician Assistant

## 2016-08-14 DIAGNOSIS — G932 Benign intracranial hypertension: Secondary | ICD-10-CM

## 2016-08-14 MED ORDER — OXYCODONE HCL 5 MG PO TABS: 5.0000 mg | ORAL_TABLET | Freq: Four times a day (QID) | ORAL | 0 refills | Status: DC | PRN

## 2016-08-19 ENCOUNTER — Ambulatory Visit: Payer: Self-pay | Admitting: Physician Assistant

## 2016-08-20 ENCOUNTER — Telehealth: Payer: Self-pay | Admitting: Physician Assistant

## 2016-08-20 ENCOUNTER — Encounter: Payer: Self-pay | Admitting: Physician Assistant

## 2016-08-20 NOTE — Telephone Encounter (Signed)
Detailed message left for patient to please call us back to reschedule appointment.

## 2016-09-30 ENCOUNTER — Ambulatory Visit (INDEPENDENT_AMBULATORY_CARE_PROVIDER_SITE_OTHER): Payer: Self-pay | Admitting: Physician Assistant

## 2016-09-30 ENCOUNTER — Encounter: Payer: Self-pay | Admitting: Physician Assistant

## 2016-09-30 DIAGNOSIS — G4459 Other complicated headache syndrome: Secondary | ICD-10-CM

## 2016-09-30 DIAGNOSIS — I10 Essential (primary) hypertension: Secondary | ICD-10-CM

## 2016-09-30 DIAGNOSIS — G932 Benign intracranial hypertension: Secondary | ICD-10-CM

## 2016-09-30 MED ORDER — OXYCODONE HCL 5 MG PO TABS: 5.0000 mg | ORAL_TABLET | Freq: Four times a day (QID) | ORAL | 0 refills | Status: DC | PRN

## 2016-09-30 MED ORDER — AMLODIPINE BESYLATE 10 MG PO TABS: 10.0000 mg | ORAL_TABLET | Freq: Every day | ORAL | 11 refills | Status: DC

## 2016-09-30 MED ORDER — GABAPENTIN 400 MG PO CAPS: 400.0000 mg | ORAL_CAPSULE | Freq: Two times a day (BID) | ORAL | 11 refills | Status: DC

## 2016-09-30 NOTE — Patient Instructions (Signed)
In a few days you may receive a survey in the mail or online from [employer] regarding your visit with us today. Please take a moment to fill this out. Your feedback is very important to our whole office. It can help us better understand your needs as well as improve your experience and satisfaction. Thank you for taking your time to complete it. We care about you.  Dyllin Gulley, PA-C  

## 2016-10-01 NOTE — Progress Notes (Signed)
BP (!) 162/89   Pulse 97   Temp (!) 97.4 F (36.3 C) (Oral)   Ht 5\' 10"  (1.778 m)   Wt (!) 357 lb 12.8 oz (162.3 kg)   BMI 51.34 kg/m    Subjective:    Patient ID: Vanessa [name], female    DOB: 1976/01/03, 41 y.o.   MRN: [id number]  HPI: Vanessa [name] is a 41 y.o. female presenting on 09/30/2016 for Follow-up (3 month)  This patient comes in for periodic recheck on medications and conditions including Pseudotumor cerebri with complicated headache syndrome. She was supposed to have a procedure performed by the neurologist and neurosurgeon. She was trying to get a with the cost of it through [facility]. There is been a delay in the funds coming through. She's had a great increase in her headaches about 2 weeks ago. She had been doing okay up until then. She has been out of amlodipine for about 1 week. I told her that having her blood pressure go up advanced to her headache. We will get her medications sent in. She had done very well with only taking 60 tablets of oxycodone over the past 2-3 months. We will refill the medication today..   All medications are reviewed today. There are no reports of any problems with the medications. All of the medical conditions are reviewed and updated.  Lab work is reviewed and will be ordered as medically necessary. There are no new problems reported with today's visit.   Relevant past medical, surgical, family and social history reviewed and updated as indicated. Allergies and medications reviewed and updated.  Past Medical History:  Diagnosis Date  . Hypertension   . Migraines   . Pseudotumor (inflammatory) of orbit   . Vision loss     Past Surgical History:  Procedure Laterality Date  . CSF SHUNT      Review of Systems  Constitutional: Negative.  Negative for activity change, fatigue and fever.  HENT: Negative.   Eyes: Negative.   Respiratory: Negative.  Negative for cough, shortness of breath and wheezing.   Cardiovascular:  Negative.  Negative for chest pain, palpitations and leg swelling.  Gastrointestinal: Negative.  Negative for abdominal pain.  Endocrine: Negative.   Genitourinary: Negative.  Negative for dysuria.  Musculoskeletal: Negative.   Skin: Negative.   Neurological: Positive for dizziness and headaches. Negative for tremors, syncope and weakness.    Allergies as of 09/30/2016      Reactions   Metrizamide Other (See Comments), Itching, Photosensitivity, Shortness Of Breath, Swelling   update Migraine instantly   Peanut Oil Anaphylaxis, Other (See Comments)   update   Iodine-131 Other (See Comments)   update      Medication List       Accurate as of 09/30/16 11:59 PM. Always use your most recent med list.          acetaZOLAMIDE 250 MG tablet Commonly known as:  DIAMOX Take 750 mg by mouth 2 (two) times daily.   amitriptyline 50 MG tablet Commonly known as:  ELAVIL Take 1 tablet (50 mg total) by mouth daily.   amLODipine 10 MG tablet Commonly known as:  NORVASC Take 1 tablet (10 mg total) by mouth daily.   diphenhydrAMINE 25 mg capsule Commonly known as:  BENADRYL Take by mouth.   gabapentin 400 MG capsule Commonly known as:  NEURONTIN Take 1 capsule (400 mg total) by mouth 2 (two) times daily.   lisinopril 20 MG tablet Commonly known as:  PRINIVIL,ZESTRIL Take 2 tablets (40 mg total) by mouth daily.   loratadine 10 MG tablet Commonly known as:  CLARITIN Take 1 tablet (10 mg total) by mouth daily.   metFORMIN 500 MG tablet Commonly known as:  GLUCOPHAGE Take 1 tablet (500 mg total) by mouth 2 (two) times daily with a meal.   ondansetron 4 MG tablet Commonly known as:  ZOFRAN Take by mouth.   oxyCODONE 5 MG immediate release tablet Commonly known as:  ROXICODONE Take 1 tablet (5 mg total) by mouth every 6 (six) hours as needed for severe pain.   potassium chloride SA 20 MEQ tablet Commonly known as:  K-DUR,KLOR-CON Take 1 tablet (20 mEq total) by mouth daily.     zaleplon 5 MG capsule Commonly known as:  SONATA Take 5 mg by mouth at bedtime as needed for sleep.          Objective:    BP (!) 162/89   Pulse 97   Temp (!) 97.4 F (36.3 C) (Oral)   Ht 5\' 10"  (1.778 m)   Wt (!) 357 lb 12.8 oz (162.3 kg)   BMI 51.34 kg/m   Allergies  Allergen Reactions  . Metrizamide Other (See Comments), Itching, Photosensitivity, Shortness Of Breath and Swelling    update Migraine instantly  . Peanut Oil Anaphylaxis and Other (See Comments)    update  . Iodine-131 Other (See Comments)    update    Physical Exam  Constitutional: She is oriented to person, place, and time. She appears well-developed and well-nourished.  Obese   HENT:  Head: Normocephalic and atraumatic.  Eyes: Pupils are equal, round, and reactive to light. Conjunctivae and EOM are normal.  Cardiovascular: Normal rate, regular rhythm, normal heart sounds and intact distal pulses.   Pulmonary/Chest: Effort normal and breath sounds normal.  Abdominal: Soft. Bowel sounds are normal.  Neurological: She is alert and oriented to person, place, and time. She has normal reflexes.  Skin: Skin is warm and dry. No rash noted.  Psychiatric: She has a normal mood and affect. Her behavior is normal. Judgment and thought content normal.    No results found for this or any previous visit.    Assessment & Plan:   1. Essential hypertension - amLODipine (NORVASC) 10 MG tablet; Take 1 tablet (10 mg total) by mouth daily.  Dispense: 30 tablet; Refill: 11  2. Other complicated headache syndrome - gabapentin (NEURONTIN) 400 MG capsule; Take 1 capsule (400 mg total) by mouth 2 (two) times daily.  Dispense: 60 capsule; Refill: 11  3. Pseudotumor cerebri - oxyCODONE (ROXICODONE) 5 MG immediate release tablet; Take 1 tablet (5 mg total) by mouth every 6 (six) hours as needed for severe pain.  Dispense: 60 tablet; Refill: 0   Current Outpatient Prescriptions:  .  acetaZOLAMIDE (DIAMOX) 250 MG  tablet, Take 750 mg by mouth 2 (two) times daily., Disp: , Rfl:  .  amitriptyline (ELAVIL) 50 MG tablet, Take 1 tablet (50 mg total) by mouth daily., Disp: 30 tablet, Rfl: 11 .  amLODipine (NORVASC) 10 MG tablet, Take 1 tablet (10 mg total) by mouth daily., Disp: 30 tablet, Rfl: 11 .  diphenhydrAMINE (BENADRYL) 25 mg capsule, Take by mouth., Disp: , Rfl:  .  gabapentin (NEURONTIN) 400 MG capsule, Take 1 capsule (400 mg total) by mouth 2 (two) times daily., Disp: 60 capsule, Rfl: 11 .  lisinopril (PRINIVIL,ZESTRIL) 20 MG tablet, Take 2 tablets (40 mg total) by mouth daily., Disp: 180 tablet, Rfl: 3 .  loratadine (CLARITIN) 10 MG tablet, Take 1 tablet (10 mg total) by mouth daily., Disp: 30 tablet, Rfl: 11 .  metFORMIN (GLUCOPHAGE) 500 MG tablet, Take 1 tablet (500 mg total) by mouth 2 (two) times daily with a meal., Disp: 60 tablet, Rfl: 11 .  ondansetron (ZOFRAN) 4 MG tablet, Take by mouth., Disp: , Rfl:  .  oxyCODONE (ROXICODONE) 5 MG immediate release tablet, Take 1 tablet (5 mg total) by mouth every 6 (six) hours as needed for severe pain., Disp: 60 tablet, Rfl: 0 .  potassium chloride SA (K-DUR,KLOR-CON) 20 MEQ tablet, Take 1 tablet (20 mEq total) by mouth daily., Disp: 30 tablet, Rfl: 3 .  zaleplon (SONATA) 5 MG capsule, Take 5 mg by mouth at bedtime as needed for sleep., Disp: , Rfl:   Continue all other maintenance medications as listed above.  Follow up plan: Return in about 2 months (around 11/30/2016) for recheck.  Educational handout given for [place] PA-C [place] [address]  [place], [place] [address] [phone]   10/01/2016, 9:58 AM

## 2016-10-22 ENCOUNTER — Ambulatory Visit: Payer: Self-pay | Admitting: Physician Assistant

## 2016-12-02 ENCOUNTER — Encounter: Payer: Self-pay | Admitting: Physician Assistant

## 2016-12-02 ENCOUNTER — Ambulatory Visit (INDEPENDENT_AMBULATORY_CARE_PROVIDER_SITE_OTHER): Payer: Self-pay | Admitting: Physician Assistant

## 2016-12-02 VITALS — BP 182/102 | HR 89 | Temp 98.7°F | Ht 70.0 in | Wt 357.4 lb

## 2016-12-02 DIAGNOSIS — G932 Benign intracranial hypertension: Secondary | ICD-10-CM

## 2016-12-02 DIAGNOSIS — I10 Essential (primary) hypertension: Secondary | ICD-10-CM

## 2016-12-02 MED ORDER — ACETAZOLAMIDE 250 MG PO TABS: 750.0000 mg | ORAL_TABLET | Freq: Two times a day (BID) | ORAL | 5 refills | Status: DC

## 2016-12-02 MED ORDER — OXYCODONE HCL 5 MG PO TABS: 5.0000 mg | ORAL_TABLET | Freq: Four times a day (QID) | ORAL | 0 refills | Status: DC | PRN

## 2016-12-02 NOTE — Progress Notes (Signed)
BP (!) 182/102   Pulse 89   Temp 98.7 F (37.1 C) (Oral)   Ht 5\' 10"  (1.778 m)   Wt (!) 357 lb 6.4 oz (162.1 kg)   BMI 51.28 kg/m    Subjective:    Patient ID: Vanessa [name], female    DOB: Oct 06, 1975, 41 y.o.   MRN: [id number]  HPI: Vanessa [name] is a 41 y.o. female presenting on 12/02/2016 for Follow-up (2 month ) This patient comes in for periodic recheck on medications and conditions including pseudotumor cerebri, headache, hypertension. She will be having her procedure reperformed through [facility]. They may be able to open up the vessel, if not she will have to have a shunt placed again. When she tried to get her Diamox at her retail pharmacy it was over $300. I have printed a prescription for her to take to a local pharmacy to check on cash price.   All medications are reviewed today. There are no reports of any problems with the medications. All of the medical conditions are reviewed and updated.  Lab work is reviewed and will be ordered as medically necessary. There are no new problems reported with today's visit.    Relevant past medical, surgical, family and social history reviewed and updated as indicated. Allergies and medications reviewed and updated.  Past Medical History:  Diagnosis Date  . Hypertension   . Migraines   . Pseudotumor (inflammatory) of orbit   . Vision loss     Past Surgical History:  Procedure Laterality Date  . CSF SHUNT      Review of Systems  Constitutional: Positive for fatigue. Negative for activity change and fever.  HENT: Negative.   Eyes: Negative.   Respiratory: Negative.  Negative for cough.   Cardiovascular: Negative.  Negative for chest pain, palpitations and leg swelling.  Gastrointestinal: Negative.  Negative for abdominal pain.  Endocrine: Negative.   Genitourinary: Negative.  Negative for dysuria.  Musculoskeletal: Negative.   Skin: Negative.   Neurological: Positive for headaches.    Allergies  as of 12/02/2016      Reactions   Metrizamide Other (See Comments), Itching, Photosensitivity, Shortness Of Breath, Swelling   update Migraine instantly   Peanut Oil Anaphylaxis, Other (See Comments)   update   Iodine-131 Other (See Comments)   update      Medication List       Accurate as of 12/02/16 12:25 PM. Always use your most recent med list.          acetaZOLAMIDE 250 MG tablet Commonly known as:  DIAMOX Take 3 tablets (750 mg total) by mouth 2 (two) times daily.   amitriptyline 50 MG tablet Commonly known as:  ELAVIL Take 1 tablet (50 mg total) by mouth daily.   amLODipine 10 MG tablet Commonly known as:  NORVASC Take 1 tablet (10 mg total) by mouth daily.   diphenhydrAMINE 25 mg capsule Commonly known as:  BENADRYL Take by mouth.   gabapentin 400 MG capsule Commonly known as:  NEURONTIN Take 1 capsule (400 mg total) by mouth 2 (two) times daily.   lisinopril 20 MG tablet Commonly known as:  PRINIVIL,ZESTRIL Take 2 tablets (40 mg total) by mouth daily.   loratadine 10 MG tablet Commonly known as:  CLARITIN Take 1 tablet (10 mg total) by mouth daily.   metFORMIN 500 MG tablet Commonly known as:  GLUCOPHAGE Take 1 tablet (500 mg total) by mouth 2 (two) times daily with a meal.  ondansetron 4 MG tablet Commonly known as:  ZOFRAN Take by mouth.   oxyCODONE 5 MG immediate release tablet Commonly known as:  ROXICODONE Take 1 tablet (5 mg total) by mouth every 6 (six) hours as needed for severe pain.   potassium chloride SA 20 MEQ tablet Commonly known as:  K-DUR,KLOR-CON Take 1 tablet (20 mEq total) by mouth daily.   zaleplon 5 MG capsule Commonly known as:  SONATA Take 5 mg by mouth at bedtime as needed for sleep.          Objective:    BP (!) 182/102   Pulse 89   Temp 98.7 F (37.1 C) (Oral)   Ht 5\' 10"  (1.778 m)   Wt (!) 357 lb 6.4 oz (162.1 kg)   BMI 51.28 kg/m   Allergies  Allergen Reactions  . Metrizamide Other (See Comments),  Itching, Photosensitivity, Shortness Of Breath and Swelling    update Migraine instantly  . Peanut Oil Anaphylaxis and Other (See Comments)    update  . Iodine-131 Other (See Comments)    update    Physical Exam  Constitutional: She is oriented to person, place, and time. She appears well-developed and well-nourished.  HENT:  Head: Normocephalic and atraumatic.  Right Ear: Tympanic membrane, external ear and ear canal normal.  Left Ear: Tympanic membrane, external ear and ear canal normal.  Nose: Nose normal. No rhinorrhea.  Mouth/Throat: Oropharynx is clear and moist and mucous membranes are normal. No oropharyngeal exudate or posterior oropharyngeal erythema.  Eyes: Pupils are equal, round, and reactive to light. Conjunctivae and EOM are normal.  Neck: Normal range of motion. Neck supple.  Cardiovascular: Normal rate, regular rhythm, normal heart sounds and intact distal pulses.   Pulmonary/Chest: Effort normal and breath sounds normal.  Abdominal: Soft. Bowel sounds are normal.  Neurological: She is alert and oriented to person, place, and time. She has normal reflexes.  Skin: Skin is warm and dry. No rash noted.  Psychiatric: She has a normal mood and affect. Her behavior is normal. Judgment and thought content normal.  Nursing note and vitals reviewed.   No results found for this or any previous visit.    Assessment & Plan:   1. Pseudotumor cerebri - oxyCODONE (ROXICODONE) 5 MG immediate release tablet; Take 1 tablet (5 mg total) by mouth every 6 (six) hours as needed for severe pain.  Dispense: 60 tablet; Refill: 0  2. Essential hypertension  3. IIH (idiopathic intracranial hypertension) Take prescription to [facility] [place] for cash price Her local pharmacy which is a retail chain, cost was $300 acetaZOLAMIDE (DIAMOX) 250 MG tablet; Take 3 tablets (750 mg total) by mouth 2 (two) times daily.  Dispense: 90 tablet; Refill: 5    Current Outpatient  Prescriptions:  .  acetaZOLAMIDE (DIAMOX) 250 MG tablet, Take 3 tablets (750 mg total) by mouth 2 (two) times daily., Disp: 90 tablet, Rfl: 5 .  amitriptyline (ELAVIL) 50 MG tablet, Take 1 tablet (50 mg total) by mouth daily., Disp: 30 tablet, Rfl: 11 .  amLODipine (NORVASC) 10 MG tablet, Take 1 tablet (10 mg total) by mouth daily., Disp: 30 tablet, Rfl: 11 .  diphenhydrAMINE (BENADRYL) 25 mg capsule, Take by mouth., Disp: , Rfl:  .  gabapentin (NEURONTIN) 400 MG capsule, Take 1 capsule (400 mg total) by mouth 2 (two) times daily., Disp: 60 capsule, Rfl: 11 .  lisinopril (PRINIVIL,ZESTRIL) 20 MG tablet, Take 2 tablets (40 mg total) by mouth daily., Disp: 180  tablet, Rfl: 3 .  loratadine (CLARITIN) 10 MG tablet, Take 1 tablet (10 mg total) by mouth daily., Disp: 30 tablet, Rfl: 11 .  metFORMIN (GLUCOPHAGE) 500 MG tablet, Take 1 tablet (500 mg total) by mouth 2 (two) times daily with a meal., Disp: 60 tablet, Rfl: 11 .  ondansetron (ZOFRAN) 4 MG tablet, Take by mouth., Disp: , Rfl:  .  oxyCODONE (ROXICODONE) 5 MG immediate release tablet, Take 1 tablet (5 mg total) by mouth every 6 (six) hours as needed for severe pain., Disp: 60 tablet, Rfl: 0 .  potassium chloride SA (K-DUR,KLOR-CON) 20 MEQ tablet, Take 1 tablet (20 mEq total) by mouth daily., Disp: 30 tablet, Rfl: 3 .  zaleplon (SONATA) 5 MG capsule, Take 5 mg by mouth at bedtime as needed for sleep., Disp: , Rfl:  Continue all other maintenance medications as listed above.  Follow up plan: Return in about 3 months (around 03/04/2017) for recheck.  Educational handout given for [place] PA-C [place] [address]  [place], [place] [address] [phone]   12/02/2016, 12:25 PM

## 2016-12-02 NOTE — Patient Instructions (Signed)
In a few days you may receive a survey in the mail or online from [employer] regarding your visit with us today. Please take a moment to fill this out. Your feedback is very important to our whole office. It can help us better understand your needs as well as improve your experience and satisfaction. Thank you for taking your time to complete it. We care about you.  Evanee Lubrano, PA-C  

## 2017-02-07 ENCOUNTER — Other Ambulatory Visit: Payer: Self-pay | Admitting: Physician Assistant

## 2017-02-07 DIAGNOSIS — G932 Benign intracranial hypertension: Secondary | ICD-10-CM

## 2017-02-07 MED ORDER — OXYCODONE HCL 5 MG PO TABS: 5.0000 mg | ORAL_TABLET | Freq: Four times a day (QID) | ORAL | 0 refills | Status: DC | PRN

## 2017-03-04 ENCOUNTER — Ambulatory Visit (INDEPENDENT_AMBULATORY_CARE_PROVIDER_SITE_OTHER): Payer: Self-pay | Admitting: Physician Assistant

## 2017-03-04 ENCOUNTER — Encounter: Payer: Self-pay | Admitting: Physician Assistant

## 2017-03-04 VITALS — BP 151/90 | HR 85 | Temp 97.3°F | Ht 70.0 in | Wt 355.4 lb

## 2017-03-04 DIAGNOSIS — G932 Benign intracranial hypertension: Secondary | ICD-10-CM

## 2017-03-04 DIAGNOSIS — I10 Essential (primary) hypertension: Secondary | ICD-10-CM

## 2017-03-04 MED ORDER — OXYCODONE HCL 10 MG PO TABS
10.0000 mg | ORAL_TABLET | Freq: Four times a day (QID) | ORAL | 0 refills | Status: DC | PRN
Start: 2017-03-04 — End: 2017-04-28

## 2017-03-04 MED ORDER — ACETAZOLAMIDE 250 MG PO TABS: 500.0000 mg | ORAL_TABLET | Freq: Two times a day (BID) | ORAL | 11 refills | Status: DC

## 2017-03-04 MED ORDER — LORATADINE 10 MG PO TABS: 10.0000 mg | ORAL_TABLET | Freq: Every day | ORAL | 11 refills | Status: DC

## 2017-03-04 NOTE — Progress Notes (Signed)
BP (!) 151/90   Pulse 85   Temp (!) 97.3 F (36.3 C) (Oral)   Ht 5\' 10"  (1.778 m)   Wt (!) 355 lb 6.4 oz (161.2 kg)   BMI 50.99 kg/m    Subjective:    Patient ID: Vanessa [name], female    DOB: July 29, 1975, 42 y.o.   MRN: [id number]  HPI: Vanessa [name] is a 42 y.o. female presenting on 03/04/2017 for Follow-up (3 month )  This patient comes in for periodic recheck on medications and conditions including hypertension, pseudotumor cerebri, idiopathic intracranial hypertension. She will be having the shunt placed again next week.  Some of her medications do need refilling.  She does have [place].  She is able to get her medications.  All medications are reviewed today. There are no reports of any problems with the medications. All of the medical conditions are reviewed and updated.  Lab work is reviewed and will be ordered as medically necessary. There are no new problems reported with today's visit.   Relevant past medical, surgical, family and social history reviewed and updated as indicated. Allergies and medications reviewed and updated.  Past Medical History:  Diagnosis Date  . Hypertension   . Migraines   . Pseudotumor (inflammatory) of orbit   . Vision loss     Past Surgical History:  Procedure Laterality Date  . CSF SHUNT      Review of Systems  Constitutional: Negative.  Negative for activity change, fatigue and fever.  HENT: Negative.   Eyes: Negative.   Respiratory: Negative.  Negative for cough, shortness of breath and wheezing.   Cardiovascular: Negative.  Negative for chest pain, palpitations and leg swelling.  Gastrointestinal: Negative.  Negative for abdominal pain.  Endocrine: Negative.   Genitourinary: Negative.  Negative for dysuria.  Musculoskeletal: Negative.   Skin: Negative.   Neurological: Positive for headaches.    Allergies as of 03/04/2017      Reactions   Metrizamide Other (See Comments), Itching, Photosensitivity, Shortness Of  Breath, Swelling   update Migraine instantly   Mushroom Extract Complex Itching   Throat itching with cough   Peanut Oil Anaphylaxis, Other (See Comments)   update   Peanut-containing Drug Products Itching   Itching throat with cough   Iodine-131 Other (See Comments)   update      Medication List        Accurate as of 03/04/17 11:01 AM. Always use your most recent med list.          acetaZOLAMIDE 250 MG tablet Commonly known as:  DIAMOX Take 2 tablets (500 mg total) by mouth 2 (two) times daily.   amitriptyline 50 MG tablet Commonly known as:  ELAVIL Take 1 tablet (50 mg total) by mouth daily.   amLODipine 10 MG tablet Commonly known as:  NORVASC Take 1 tablet (10 mg total) by mouth daily.   diphenhydrAMINE 25 mg capsule Commonly known as:  BENADRYL Take by mouth.   gabapentin 400 MG capsule Commonly known as:  NEURONTIN Take 1 capsule (400 mg total) by mouth 2 (two) times daily.   lisinopril 20 MG tablet Commonly known as:  PRINIVIL,ZESTRIL Take 2 tablets (40 mg total) by mouth daily.   loratadine 10 MG tablet Commonly known as:  CLARITIN Take 1 tablet (10 mg total) by mouth daily.   metFORMIN 500 MG tablet Commonly known as:  GLUCOPHAGE Take 1 tablet (500 mg total) by mouth 2 (two) times daily with a meal.   ondansetron  4 MG tablet Commonly known as:  ZOFRAN Take by mouth.   Oxycodone HCl 10 MG Tabs Commonly known as:  ROXICODONE Take 1 tablet (10 mg total) by mouth every 6 (six) hours as needed for severe pain.   potassium chloride SA 20 MEQ tablet Commonly known as:  K-DUR,KLOR-CON Take 1 tablet (20 mEq total) by mouth daily.   zaleplon 5 MG capsule Commonly known as:  SONATA Take 5 mg by mouth at bedtime as needed for sleep.          Objective:    BP (!) 151/90   Pulse 85   Temp (!) 97.3 F (36.3 C) (Oral)   Ht 5\' 10"  (1.778 m)   Wt (!) 355 lb 6.4 oz (161.2 kg)   BMI 50.99 kg/m   Allergies  Allergen Reactions  . Metrizamide Other  (See Comments), Itching, Photosensitivity, Shortness Of Breath and Swelling    update Migraine instantly  . Mushroom Extract Complex Itching    Throat itching with cough  . Peanut Oil Anaphylaxis and Other (See Comments)    update  . Peanut-Containing Drug Products Itching    Itching throat with cough  . Iodine-131 Other (See Comments)    update    Physical Exam  Constitutional: She is oriented to person, place, and time. She appears well-developed and well-nourished.  HENT:  Head: Normocephalic and atraumatic.  Eyes: Conjunctivae and EOM are normal. Pupils are equal, round, and reactive to light.  Cardiovascular: Normal rate, regular rhythm, normal heart sounds and intact distal pulses.  Pulmonary/Chest: Effort normal and breath sounds normal.  Abdominal: Soft. Bowel sounds are normal.  Neurological: She is alert and oriented to person, place, and time. She has normal reflexes.  Skin: Skin is warm and dry. No rash noted.  Psychiatric: She has a normal mood and affect. Her behavior is normal. Judgment and thought content normal.  Nursing note and vitals reviewed.   No results found for this or any previous visit.    Assessment & Plan:   1. IIH (idiopathic intracranial hypertension) - acetaZOLAMIDE (DIAMOX) 250 MG tablet; Take 2 tablets (500 mg total) by mouth 2 (two) times daily.  Dispense: 120 tablet; Refill: 11  2. Pseudotumor cerebri - Oxycodone HCl 10 MG TABS; Take 1 tablet (10 mg total) by mouth every 6 (six) hours as needed for severe pain.  Dispense: 120 tablet; Refill: 0    Current Outpatient Medications:  .  acetaZOLAMIDE (DIAMOX) 250 MG tablet, Take 2 tablets (500 mg total) by mouth 2 (two) times daily., Disp: 120 tablet, Rfl: 11 .  amitriptyline (ELAVIL) 50 MG tablet, Take 1 tablet (50 mg total) by mouth daily., Disp: 30 tablet, Rfl: 11 .  amLODipine (NORVASC) 10 MG tablet, Take 1 tablet (10 mg total) by mouth daily., Disp: 30 tablet, Rfl: 11 .  diphenhydrAMINE  (BENADRYL) 25 mg capsule, Take by mouth., Disp: , Rfl:  .  gabapentin (NEURONTIN) 400 MG capsule, Take 1 capsule (400 mg total) by mouth 2 (two) times daily., Disp: 60 capsule, Rfl: 11 .  lisinopril (PRINIVIL,ZESTRIL) 20 MG tablet, Take 2 tablets (40 mg total) by mouth daily., Disp: 180 tablet, Rfl: 3 .  loratadine (CLARITIN) 10 MG tablet, Take 1 tablet (10 mg total) by mouth daily., Disp: 30 tablet, Rfl: 11 .  metFORMIN (GLUCOPHAGE) 500 MG tablet, Take 1 tablet (500 mg total) by mouth 2 (two) times daily with a meal., Disp: 60 tablet, Rfl: 11 .  ondansetron (ZOFRAN) 4 MG tablet, Take  by mouth., Disp: , Rfl:  .  Oxycodone HCl 10 MG TABS, Take 1 tablet (10 mg total) by mouth every 6 (six) hours as needed for severe pain., Disp: 120 tablet, Rfl: 0 .  potassium chloride SA (K-DUR,KLOR-CON) 20 MEQ tablet, Take 1 tablet (20 mEq total) by mouth daily., Disp: 30 tablet, Rfl: 3 .  zaleplon (SONATA) 5 MG capsule, Take 5 mg by mouth at bedtime as needed for sleep., Disp: , Rfl:  Continue all other maintenance medications as listed above.  Follow up plan: Return in about 3 months (around 06/02/2017) for recheck.  Educational handout given for [place] PA-C [place] [address]  [place], [place] [address] [phone]   03/04/2017, 11:01 AM

## 2017-03-04 NOTE — Patient Instructions (Signed)
In a few days you may receive a survey in the mail or online from [employer] regarding your visit with us today. Please take a moment to fill this out. Your feedback is very important to our whole office. It can help us better understand your needs as well as improve your experience and satisfaction. Thank you for taking your time to complete it. We care about you.  Kynzie Polgar, PA-C  

## 2017-03-14 MED ORDER — GENERIC EXTERNAL MEDICATION
Status: DC
Start: ? — End: 2017-03-14

## 2017-03-14 MED ORDER — AMLODIPINE BESYLATE 10 MG PO TABS
10.00 | ORAL_TABLET | ORAL | Status: DC
Start: ? — End: 2017-03-14

## 2017-03-14 MED ORDER — HYDROCODONE-ACETAMINOPHEN 5-325 MG PO TABS
ORAL_TABLET | ORAL | Status: DC
Start: ? — End: 2017-03-14

## 2017-03-14 MED ORDER — METFORMIN HCL 500 MG PO TABS
500.00 | ORAL_TABLET | ORAL | Status: DC
Start: 2017-03-12 — End: 2017-03-14

## 2017-03-14 MED ORDER — ONDANSETRON HCL 4 MG/2ML IJ SOLN
4.00 | INTRAMUSCULAR | Status: DC
Start: ? — End: 2017-03-14

## 2017-03-14 MED ORDER — DIPHENHYDRAMINE HCL 50 MG/ML IJ SOLN
12.50 | INTRAMUSCULAR | Status: DC
Start: ? — End: 2017-03-14

## 2017-03-14 MED ORDER — SODIUM CHLORIDE 0.9 % IV SOLN
INTRAVENOUS | Status: DC
Start: ? — End: 2017-03-14

## 2017-03-14 MED ORDER — DOCUSATE SODIUM 100 MG PO CAPS
100.00 | ORAL_CAPSULE | ORAL | Status: DC
Start: 2017-03-12 — End: 2017-03-14

## 2017-03-14 MED ORDER — ACETAZOLAMIDE 250 MG PO TABS
750.00 | ORAL_TABLET | ORAL | Status: DC
Start: 2017-03-12 — End: 2017-03-14

## 2017-03-14 MED ORDER — FENTANYL CITRATE (PF) 2500 MCG/50ML IJ SOLN
INTRAMUSCULAR | Status: DC
Start: ? — End: 2017-03-14

## 2017-03-14 MED ORDER — SENNA-DOCUSATE SODIUM 8.6-50 MG PO TABS
ORAL_TABLET | ORAL | Status: DC
Start: 2017-03-12 — End: 2017-03-14

## 2017-04-28 ENCOUNTER — Telehealth: Payer: Self-pay | Admitting: Physician Assistant

## 2017-04-28 ENCOUNTER — Other Ambulatory Visit: Payer: Self-pay | Admitting: Physician Assistant

## 2017-04-28 ENCOUNTER — Ambulatory Visit (INDEPENDENT_AMBULATORY_CARE_PROVIDER_SITE_OTHER): Payer: Self-pay | Admitting: Physician Assistant

## 2017-04-28 ENCOUNTER — Encounter: Payer: Self-pay | Admitting: Physician Assistant

## 2017-04-28 VITALS — BP 158/96 | HR 88 | Temp 97.9°F | Ht 70.0 in | Wt 362.8 lb

## 2017-04-28 DIAGNOSIS — G932 Benign intracranial hypertension: Secondary | ICD-10-CM

## 2017-04-28 DIAGNOSIS — G4459 Other complicated headache syndrome: Secondary | ICD-10-CM

## 2017-04-28 DIAGNOSIS — N760 Acute vaginitis: Secondary | ICD-10-CM

## 2017-04-28 DIAGNOSIS — B9689 Other specified bacterial agents as the cause of diseases classified elsewhere: Secondary | ICD-10-CM

## 2017-04-28 DIAGNOSIS — Z982 Presence of cerebrospinal fluid drainage device: Secondary | ICD-10-CM

## 2017-04-28 MED ORDER — OXYCODONE HCL 10 MG PO TABS: 10.0000 mg | ORAL_TABLET | Freq: Four times a day (QID) | ORAL | 0 refills | Status: DC | PRN

## 2017-04-28 MED ORDER — FLUCONAZOLE 150 MG PO TABS: 150.0000 mg | ORAL_TABLET | Freq: Once | ORAL | 0 refills | Status: AC

## 2017-04-28 NOTE — Telephone Encounter (Signed)
Sent!

## 2017-04-28 NOTE — Telephone Encounter (Signed)
See below, I contacted [facility] and they have cancelled this prescription.  Can you resend to [facility] please, I've added them as the pharmacy.

## 2017-04-28 NOTE — Progress Notes (Signed)
BP (!) 158/96   Pulse 88   Temp 97.9 F (36.6 C) (Oral)   Ht 5\' 10"  (1.778 m)   Wt (!) 362 lb 12.8 oz (164.6 kg)   BMI 52.06 kg/m    Subjective:    Patient ID: Vanessa [name], female    DOB: 1975-10-02, 42 y.o.   MRN: [id number]  HPI: Vanessa [name] is a 42 y.o. female presenting on 04/28/2017 for Medication Refill and Er follow up Patient comes in today for recheck on medications.  She also had treatment for BV started yesterday by the urgent care.  She had her shunt placement a couple weeks ago at [facility].  She reports that she has had no complications from this one.  She does need referral to neurology in [place].  She is currently on [place].  She does have a history of idiopathic intracranial hypertension, blindness of right eye, shunt placement, history of infected shunt placement.   Past Medical History:  Diagnosis Date  . Hypertension   . Migraines   . Pseudotumor (inflammatory) of orbit   . Vision loss    Relevant past medical, surgical, family and social history reviewed and updated as indicated. Interim medical history since our last visit reviewed. Allergies and medications reviewed and updated. DATA REVIEWED: CHART IN EPIC  Family History reviewed for pertinent findings.  Review of Systems  Constitutional: Negative.   HENT: Negative.   Eyes: Negative.   Respiratory: Negative.   Gastrointestinal: Positive for abdominal pain. Negative for diarrhea.  Genitourinary: Negative.     Allergies as of 04/28/2017      Reactions   Metrizamide Other (See Comments), Itching, Photosensitivity, Shortness Of Breath, Swelling   update Migraine instantly   Mushroom Extract Complex Itching   Throat itching with cough   Peanut Oil Anaphylaxis, Other (See Comments)   update   Peanut-containing Drug Products Itching   Itching throat with cough   Iodine-131 Other (See Comments)   update      Medication List        Accurate as of 04/28/17  9:48 AM. Always use your  most recent med list.          acetaZOLAMIDE 250 MG tablet Commonly known as:  DIAMOX Take 2 tablets (500 mg total) by mouth 2 (two) times daily.   amitriptyline 50 MG tablet Commonly known as:  ELAVIL Take 1 tablet (50 mg total) by mouth daily.   amLODipine 10 MG tablet Commonly known as:  NORVASC Take 1 tablet (10 mg total) by mouth daily.   diphenhydrAMINE 25 mg capsule Commonly known as:  BENADRYL Take by mouth.   fluconazole 150 MG tablet Commonly known as:  DIFLUCAN Take 1 tablet (150 mg total) by mouth once for 1 dose.   gabapentin 400 MG capsule Commonly known as:  NEURONTIN Take 1 capsule (400 mg total) by mouth 2 (two) times daily.   lisinopril 20 MG tablet Commonly known as:  PRINIVIL,ZESTRIL Take 2 tablets (40 mg total) by mouth daily.   loratadine 10 MG tablet Commonly known as:  CLARITIN Take 1 tablet (10 mg total) by mouth daily.   metFORMIN 500 MG tablet Commonly known as:  GLUCOPHAGE Take 1 tablet (500 mg total) by mouth 2 (two) times daily with a meal.   metroNIDAZOLE 500 MG tablet Commonly known as:  FLAGYL   ondansetron 4 MG tablet Commonly known as:  ZOFRAN Take by mouth.   Oxycodone HCl 10 MG Tabs Take 1 tablet (10 mg  total) by mouth every 6 (six) hours as needed.   potassium chloride SA 20 MEQ tablet Commonly known as:  K-DUR,KLOR-CON Take 1 tablet (20 mEq total) by mouth daily.   zaleplon 5 MG capsule Commonly known as:  SONATA Take 5 mg by mouth at bedtime as needed for sleep.          Objective:    BP (!) 158/96   Pulse 88   Temp 97.9 F (36.6 C) (Oral)   Ht 5\' 10"  (1.778 m)   Wt (!) 362 lb 12.8 oz (164.6 kg)   BMI 52.06 kg/m   Allergies  Allergen Reactions  . Metrizamide Other (See Comments), Itching, Photosensitivity, Shortness Of Breath and Swelling    update Migraine instantly  . Mushroom Extract Complex Itching    Throat itching with cough  . Peanut Oil Anaphylaxis and Other (See Comments)    update  .  Peanut-Containing Drug Products Itching    Itching throat with cough  . Iodine-131 Other (See Comments)    update    Wt Readings from Last 3 Encounters:  04/28/17 (!) 362 lb 12.8 oz (164.6 kg)  03/04/17 (!) 355 lb 6.4 oz (161.2 kg)  12/02/16 (!) 357 lb 6.4 oz (162.1 kg)    Physical Exam  Constitutional: She is oriented to person, place, and time. She appears well-developed and well-nourished. She appears distressed.  HENT:  Head: Normocephalic and atraumatic.  Eyes: Conjunctivae and EOM are normal. Pupils are equal, round, and reactive to light.  Cardiovascular: Normal rate, regular rhythm, normal heart sounds and intact distal pulses.  Pulmonary/Chest: Effort normal and breath sounds normal.  Abdominal: Soft. Bowel sounds are normal.  Neurological: She is alert and oriented to person, place, and time. She has normal reflexes.  Skin: Skin is warm and dry. No rash noted.  Psychiatric: She has a normal mood and affect. Her behavior is normal. Judgment and thought content normal.    No results found for this or any previous visit.    Assessment & Plan:   1. IIH (idiopathic intracranial hypertension) - Ambulatory referral to Neurology  2. S/P VP shunt - Ambulatory referral to Neurology  3. Other complicated headache syndrome - Ambulatory referral to Neurology  4. BV (bacterial vaginosis)  5. Pseudotumor cerebri - Oxycodone HCl 10 MG TABS; Take 1 tablet (10 mg total) by mouth every 6 (six) hours as needed.  Dispense: 120 tablet; Refill: 0   Continue all other maintenance medications as listed above.  Follow up plan: Recheck 3 months  Educational handout given for [place] PA-C [place] [address]  [place], [place] [address] [phone]   04/28/2017, 9:48 AM

## 2017-04-28 NOTE — Telephone Encounter (Signed)
She never told me And I said Vanessa [name] in our conversation.

## 2017-04-28 NOTE — Telephone Encounter (Signed)
Patient aware that prescription sent to [facility]

## 2017-05-21 DIAGNOSIS — Z0289 Encounter for other administrative examinations: Secondary | ICD-10-CM

## 2017-05-30 ENCOUNTER — Telehealth: Payer: Self-pay | Admitting: Physician Assistant

## 2017-05-30 NOTE — Telephone Encounter (Signed)
Pt aware FMLA papers have been faxed

## 2017-06-03 ENCOUNTER — Encounter: Payer: Self-pay | Admitting: Physician Assistant

## 2017-06-03 ENCOUNTER — Ambulatory Visit (INDEPENDENT_AMBULATORY_CARE_PROVIDER_SITE_OTHER): Payer: Self-pay | Admitting: Physician Assistant

## 2017-06-03 VITALS — BP 158/93 | HR 83 | Temp 97.2°F | Ht 70.0 in | Wt 360.2 lb

## 2017-06-03 DIAGNOSIS — G932 Benign intracranial hypertension: Secondary | ICD-10-CM

## 2017-06-03 DIAGNOSIS — I10 Essential (primary) hypertension: Secondary | ICD-10-CM

## 2017-06-03 MED ORDER — HYDROCHLOROTHIAZIDE 25 MG PO TABS: 25.0000 mg | ORAL_TABLET | Freq: Every day | ORAL | 3 refills | Status: DC

## 2017-06-03 MED ORDER — OXYCODONE HCL 10 MG PO TABS: 10.0000 mg | ORAL_TABLET | Freq: Four times a day (QID) | ORAL | 0 refills | Status: DC | PRN

## 2017-06-03 NOTE — Progress Notes (Signed)
BP (!) 158/93   Pulse 83   Temp (!) 97.2 F (36.2 C) (Oral)   Ht 5\' 10"  (1.778 m)   Wt (!) 360 lb 3.2 oz (163.4 kg)   BMI 51.68 kg/m    Subjective:    Patient ID: Vanessa [name], female    DOB: 07-24-1975, 42 y.o.   MRN: [id number]  HPI: Vanessa [name] is a 42 y.o. female presenting on 06/03/2017 for Follow-up (3 month )  This patient comes in for 84-month recheck on her medications.  She is several months status post shunt placement.  The neurosurgeon has released her at this time.  She has a neurology appointment in May.  The ophthalmologist has found her right eye to have some glaucoma.  She has been fitted for new glasses.  She states that she only has a few headaches now at this time.  She still has some elevated blood pressure.  We will make some adjustments to this.  And we are going to reduce her oxycodone to only 2 daily.  She will try to stretch those out as best she can.  We will plan to recheck her in a couple of months.   Past Medical History:  Diagnosis Date  . Hypertension   . Migraines   . Pseudotumor (inflammatory) of orbit   . Vision loss    Relevant past medical, surgical, family and social history reviewed and updated as indicated. Interim medical history since our last visit reviewed. Allergies and medications reviewed and updated. DATA REVIEWED: CHART IN EPIC  Family History reviewed for pertinent findings.  Review of Systems  Constitutional: Negative.   HENT: Negative.   Eyes: Negative.   Respiratory: Negative.   Gastrointestinal: Negative.   Genitourinary: Negative.     Allergies as of 06/03/2017      Reactions   Metrizamide Other (See Comments), Itching, Photosensitivity, Shortness Of Breath, Swelling   update Migraine instantly   Mushroom Extract Complex Itching   Throat itching with cough   Peanut Oil Anaphylaxis, Other (See Comments)   update   Peanut-containing Drug Products Itching   Itching throat with cough   Diflucan In Dextrose  [fluconazole In Dextrose] Hives   Iodine-131 Other (See Comments)   update      Medication List        Accurate as of 06/03/17  2:01 PM. Always use your most recent med list.          acetaZOLAMIDE 250 MG tablet Commonly known as:  DIAMOX Take 2 tablets (500 mg total) by mouth 2 (two) times daily.   amitriptyline 50 MG tablet Commonly known as:  ELAVIL Take 1 tablet (50 mg total) by mouth daily.   amLODipine 10 MG tablet Commonly known as:  NORVASC Take 1 tablet (10 mg total) by mouth daily.   diphenhydrAMINE 25 mg capsule Commonly known as:  BENADRYL Take by mouth.   gabapentin 400 MG capsule Commonly known as:  NEURONTIN Take 1 capsule (400 mg total) by mouth 2 (two) times daily.   hydrochlorothiazide 25 MG tablet Commonly known as:  HYDRODIURIL Take 1 tablet (25 mg total) by mouth daily.   lisinopril 20 MG tablet Commonly known as:  PRINIVIL,ZESTRIL Take 2 tablets (40 mg total) by mouth daily.   loratadine 10 MG tablet Commonly known as:  CLARITIN Take 1 tablet (10 mg total) by mouth daily.   metFORMIN 500 MG tablet Commonly known as:  GLUCOPHAGE Take 1 tablet (500 mg total) by mouth 2 (  two) times daily with a meal.   ondansetron 4 MG tablet Commonly known as:  ZOFRAN Take by mouth.   Oxycodone HCl 10 MG Tabs Take 1 tablet (10 mg total) by mouth every 6 (six) hours as needed.   potassium chloride SA 20 MEQ tablet Commonly known as:  K-DUR,KLOR-CON Take 1 tablet (20 mEq total) by mouth daily.          Objective:    BP (!) 158/93   Pulse 83   Temp (!) 97.2 F (36.2 C) (Oral)   Ht 5\' 10"  (1.778 m)   Wt (!) 360 lb 3.2 oz (163.4 kg)   BMI 51.68 kg/m   Allergies  Allergen Reactions  . Metrizamide Other (See Comments), Itching, Photosensitivity, Shortness Of Breath and Swelling    update Migraine instantly  . Mushroom Extract Complex Itching    Throat itching with cough  . Peanut Oil Anaphylaxis and Other (See Comments)    update  .  Peanut-Containing Drug Products Itching    Itching throat with cough  . Diflucan In Dextrose [Fluconazole In Dextrose] Hives  . Iodine-131 Other (See Comments)    update    Wt Readings from Last 3 Encounters:  06/03/17 (!) 360 lb 3.2 oz (163.4 kg)  04/28/17 (!) 362 lb 12.8 oz (164.6 kg)  03/04/17 (!) 355 lb 6.4 oz (161.2 kg)    Physical Exam  Constitutional: She is oriented to person, place, and time. She appears well-developed and well-nourished.  HENT:  Head: Normocephalic and atraumatic.  Eyes: Pupils are equal, round, and reactive to light. Conjunctivae and EOM are normal.  Cardiovascular: Normal rate, regular rhythm, normal heart sounds and intact distal pulses.  Pulmonary/Chest: Effort normal and breath sounds normal.  Abdominal: Soft. Bowel sounds are normal.  Neurological: She is alert and oriented to person, place, and time. She has normal reflexes.  Skin: Skin is warm and dry. No rash noted.  Psychiatric: She has a normal mood and affect. Her behavior is normal. Judgment and thought content normal.    No results found for this or any previous visit.    Assessment & Plan:   1. Pseudotumor cerebri - Oxycodone HCl 10 MG TABS; Take 1 tablet (10 mg total) by mouth every 6 (six) hours as needed.  Dispense: 120 tablet; Refill: 0  2. Essential hypertension - hydrochlorothiazide (HYDRODIURIL) 25 MG tablet; Take 1 tablet (25 mg total) by mouth daily.  Dispense: 90 tablet; Refill: 3    Continue all other maintenance medications as listed above.  Follow up plan: Return in about 2 months (around 08/03/2017) for recheck.  Educational handout given for [place] PA-C [facility] [address]  [place], [place] [address] [phone]   06/03/2017, 2:01 PM

## 2017-08-04 ENCOUNTER — Ambulatory Visit (INDEPENDENT_AMBULATORY_CARE_PROVIDER_SITE_OTHER): Payer: Self-pay | Admitting: Physician Assistant

## 2017-08-04 ENCOUNTER — Encounter: Payer: Self-pay | Admitting: Physician Assistant

## 2017-08-04 VITALS — BP 139/77 | HR 89 | Temp 97.7°F | Ht 70.0 in | Wt 355.2 lb

## 2017-08-04 DIAGNOSIS — G4459 Other complicated headache syndrome: Secondary | ICD-10-CM

## 2017-08-04 DIAGNOSIS — M542 Cervicalgia: Secondary | ICD-10-CM

## 2017-08-04 DIAGNOSIS — E8881 Metabolic syndrome: Secondary | ICD-10-CM

## 2017-08-04 DIAGNOSIS — E282 Polycystic ovarian syndrome: Secondary | ICD-10-CM

## 2017-08-04 DIAGNOSIS — G43809 Other migraine, not intractable, without status migrainosus: Secondary | ICD-10-CM

## 2017-08-04 DIAGNOSIS — I10 Essential (primary) hypertension: Secondary | ICD-10-CM

## 2017-08-04 DIAGNOSIS — L905 Scar conditions and fibrosis of skin: Secondary | ICD-10-CM

## 2017-08-04 DIAGNOSIS — G932 Benign intracranial hypertension: Secondary | ICD-10-CM

## 2017-08-04 LAB — BAYER DCA HB A1C WAIVED: HB A1C (BAYER DCA - WAIVED): 8.2 % — ABNORMAL HIGH (ref <7.0)

## 2017-08-04 MED ORDER — AMITRIPTYLINE HCL 50 MG PO TABS: 50.0000 mg | ORAL_TABLET | Freq: Every day | ORAL | 11 refills | Status: DC

## 2017-08-04 MED ORDER — LISINOPRIL 40 MG PO TABS: 40.0000 mg | ORAL_TABLET | Freq: Every day | ORAL | 3 refills | Status: DC

## 2017-08-04 MED ORDER — OXYCODONE HCL 10 MG PO TABS: 10.0000 mg | ORAL_TABLET | Freq: Four times a day (QID) | ORAL | 0 refills | Status: DC | PRN

## 2017-08-04 MED ORDER — AMLODIPINE BESYLATE 10 MG PO TABS: 10.0000 mg | ORAL_TABLET | Freq: Every day | ORAL | 11 refills | Status: DC

## 2017-08-04 MED ORDER — POTASSIUM CHLORIDE CRYS ER 20 MEQ PO TBCR: 20.0000 meq | EXTENDED_RELEASE_TABLET | Freq: Every day | ORAL | 3 refills | Status: DC

## 2017-08-04 MED ORDER — METFORMIN HCL 500 MG PO TABS: 500.0000 mg | ORAL_TABLET | Freq: Two times a day (BID) | ORAL | 11 refills | Status: DC

## 2017-08-04 MED ORDER — GABAPENTIN 400 MG PO CAPS: 400.0000 mg | ORAL_CAPSULE | Freq: Two times a day (BID) | ORAL | 11 refills | Status: DC

## 2017-08-04 MED ORDER — ACETAZOLAMIDE 250 MG PO TABS: 500.0000 mg | ORAL_TABLET | Freq: Two times a day (BID) | ORAL | 11 refills | Status: DC

## 2017-08-04 MED ORDER — HYDROCHLOROTHIAZIDE 25 MG PO TABS: 25.0000 mg | ORAL_TABLET | Freq: Every day | ORAL | 3 refills | Status: DC

## 2017-08-04 MED ORDER — DEXTROMETHORPHAN-GUAIFENESIN 10-100 MG/5ML PO LIQD: 5.0000 mL | Freq: Four times a day (QID) | ORAL | 1 refills | Status: DC | PRN

## 2017-08-04 MED ORDER — LORATADINE 10 MG PO TABS: 10.0000 mg | ORAL_TABLET | Freq: Every day | ORAL | 11 refills | Status: DC

## 2017-08-04 NOTE — Progress Notes (Signed)
 BP 139/77   Pulse 89   Temp 97.7 F (36.5 C) (Oral)   Ht 5' 10" (1.778 m)   Wt (!) 355 lb 3.2 oz (161.1 kg)   BMI 50.97 kg/m    Subjective:    Patient ID: Vanessa [name], female    DOB: 01/28/1976, 42 y.o.   MRN: [id number]  HPI: Vanessa [name] is a 42 y.o. female presenting on 08/04/2017 for Hypertension (2 month follow up )  This patient comes in for periodic recheck on medications and conditions including **hypertension, idiopathic, migraine, PCOS, pseudotumor cerebri, [facility] and scar tissue from her recent surgery.  She does need to go to ear nose and throat because it bothers her neck.  Feeling well overall her numbers seem to be improving.  And she is losing weight.  She has been still followed by neurosurgeon and neurology.  She also is bulging.  She commended her on all of these efforts and we will recheck her sleep.  All*.   All medications are reviewed today. There are no reports of any problems with the medications. All of the medical conditions are reviewed and updated.  Lab work is reviewed and will be ordered as medically necessary. There are no new problems reported with today's visit.   Past Medical History:  Diagnosis Date  . Hypertension   . Migraines   . Pseudotumor (inflammatory) of orbit   . Vision loss    Relevant past medical, surgical, family and social history reviewed and updated as indicated. Interim medical history since our last visit reviewed. Allergies and medications reviewed and updated. DATA REVIEWED: CHART IN EPIC  Family History reviewed for pertinent findings.  Review of Systems  Constitutional: Negative.  Negative for activity change, fatigue and fever.  HENT: Negative.   Eyes: Negative.   Respiratory: Negative.  Negative for cough.   Cardiovascular: Negative.  Negative for chest pain.  Gastrointestinal: Negative.  Negative for abdominal pain.  Endocrine: Negative.   Genitourinary: Negative.  Negative for dysuria.    Musculoskeletal: Negative.   Skin: Negative.   Neurological: Negative.     Allergies as of 08/04/2017      Reactions   Metrizamide Other (See Comments), Itching, Photosensitivity, Shortness Of Breath, Swelling   update Migraine instantly   Mushroom Extract Complex Itching   Throat itching with cough   Peanut Oil Anaphylaxis, Other (See Comments)   update   Peanut-containing Drug Products Itching   Itching throat with cough   Diflucan In Dextrose [fluconazole In Dextrose] Hives   Iodine-131 Other (See Comments)   update      Medication List        Accurate as of 08/04/17  1:51 PM. Always use your most recent med list.          acetaZOLAMIDE 250 MG tablet Commonly known as:  DIAMOX Take 2 tablets (500 mg total) by mouth 2 (two) times daily.   amitriptyline 50 MG tablet Commonly known as:  ELAVIL Take 1 tablet (50 mg total) by mouth daily.   amLODipine 10 MG tablet Commonly known as:  NORVASC Take 1 tablet (10 mg total) by mouth daily.   dextromethorphan-guaiFENesin 10-100 MG/5ML liquid Commonly known as:  TUSSIN DM Take 5 mLs by mouth every 6 (six) hours as needed for cough.   diphenhydrAMINE 25 mg capsule Commonly known as:  BENADRYL Take by mouth.   FLUoxetine 20 MG capsule Commonly known as:  PROZAC TAKE 1 CAPSULE (20 MG) BY ORAL ROUTE ONCE DAILY   IN THE EVENING FOR 30 DAYS   gabapentin 400 MG capsule Commonly known as:  NEURONTIN Take 1 capsule (400 mg total) by mouth 2 (two) times daily.   hydrochlorothiazide 25 MG tablet Commonly known as:  HYDRODIURIL Take 1 tablet (25 mg total) by mouth daily.   lisinopril 40 MG tablet Commonly known as:  PRINIVIL,ZESTRIL Take 1 tablet (40 mg total) by mouth daily.   loratadine 10 MG tablet Commonly known as:  CLARITIN Take 1 tablet (10 mg total) by mouth daily.   metFORMIN 500 MG tablet Commonly known as:  GLUCOPHAGE Take 1 tablet (500 mg total) by mouth 2 (two) times daily with a meal.   ondansetron 4 MG  tablet Commonly known as:  ZOFRAN Take by mouth.   Oxycodone HCl 10 MG Tabs Take 1 tablet (10 mg total) by mouth every 6 (six) hours as needed.   potassium chloride SA 20 MEQ tablet Commonly known as:  K-DUR,KLOR-CON Take 1 tablet (20 mEq total) by mouth daily.          Objective:    BP 139/77   Pulse 89   Temp 97.7 F (36.5 C) (Oral)   Ht 5' 10" (1.778 m)   Wt (!) 355 lb 3.2 oz (161.1 kg)   BMI 50.97 kg/m   Allergies  Allergen Reactions  . Metrizamide Other (See Comments), Itching, Photosensitivity, Shortness Of Breath and Swelling    update Migraine instantly  . Mushroom Extract Complex Itching    Throat itching with cough  . Peanut Oil Anaphylaxis and Other (See Comments)    update  . Peanut-Containing Drug Products Itching    Itching throat with cough  . Diflucan In Dextrose [Fluconazole In Dextrose] Hives  . Iodine-131 Other (See Comments)    update    Wt Readings from Last 3 Encounters:  08/04/17 (!) 355 lb 3.2 oz (161.1 kg)  06/03/17 (!) 360 lb 3.2 oz (163.4 kg)  04/28/17 (!) 362 lb 12.8 oz (164.6 kg)    Physical Exam  Constitutional: She is oriented to person, place, and time. She appears well-developed and well-nourished.  HENT:  Head: Normocephalic and atraumatic.  Eyes: Pupils are equal, round, and reactive to light. Conjunctivae and EOM are normal.  Cardiovascular: Normal rate, regular rhythm, normal heart sounds and intact distal pulses.  Pulmonary/Chest: Effort normal and breath sounds normal.  Abdominal: Soft. Bowel sounds are normal.  Neurological: She is alert and oriented to person, place, and time. She has normal reflexes.  Skin: Skin is warm and dry. No rash noted.  Psychiatric: She has a normal mood and affect. Her behavior is normal. Judgment and thought content normal.    Results for orders placed or performed in visit on 08/04/17  Bayer DCA Hb A1c Waived  Result Value Ref Range   HB A1C (BAYER DCA - WAIVED) 8.2 (H) <7.0 %        Assessment & Plan:   1. Essential hypertension - lisinopril (PRINIVIL,ZESTRIL) 40 MG tablet; Take 1 tablet (40 mg total) by mouth daily.  Dispense: 90 tablet; Refill: 3 - hydrochlorothiazide (HYDRODIURIL) 25 MG tablet; Take 1 tablet (25 mg total) by mouth daily.  Dispense: 90 tablet; Refill: 3 - amLODipine (NORVASC) 10 MG tablet; Take 1 tablet (10 mg total) by mouth daily.  Dispense: 30 tablet; Refill: 11 - potassium chloride SA (K-DUR,KLOR-CON) 20 MEQ tablet; Take 1 tablet (20 mEq total) by mouth daily.  Dispense: 30 tablet; Refill: 3 - CMP14+EGFR - Bayer DCA Hb A1c Waived    2. IIH (idiopathic intracranial hypertension) - acetaZOLAMIDE (DIAMOX) 250 MG tablet; Take 2 tablets (500 mg total) by mouth 2 (two) times daily.  Dispense: 120 tablet; Refill: 11  3. Other migraine without status migrainosus, not intractable - amitriptyline (ELAVIL) 50 MG tablet; Take 1 tablet (50 mg total) by mouth daily.  Dispense: 30 tablet; Refill: 11  4. Other complicated headache syndrome - gabapentin (NEURONTIN) 400 MG capsule; Take 1 capsule (400 mg total) by mouth 2 (two) times daily.  Dispense: 60 capsule; Refill: 11  5. PCOS (polycystic ovarian syndrome) - metFORMIN (GLUCOPHAGE) 500 MG tablet; Take 1 tablet (500 mg total) by mouth 2 (two) times daily with a meal.  Dispense: 60 tablet; Refill: 11  6. Pseudotumor cerebri - Oxycodone HCl 10 MG TABS; Take 1 tablet (10 mg total) by mouth every 6 (six) hours as needed.  Dispense: 120 tablet; Refill: 0  7. Metabolic syndrome - GXQ11+HERD - Bayer DCA Hb A1c Waived  8. Scar tissue - Ambulatory referral to ENT  9. Neck pain - Ambulatory referral to ENT   Continue all other maintenance medications as listed above.  Follow up plan: Return in about 3 months (around 11/04/2017).  Educational handout given for [place] PA-C [place] [address]  [place], [place] [address] [phone]   08/04/2017, 1:51 PM

## 2017-08-05 LAB — CMP14+EGFR
ALK PHOS: 50 IU/L (ref 39–117)
ALT: 21 IU/L (ref 0–32)
AST: 18 IU/L (ref 0–40)
Albumin/Globulin Ratio: 1.2 (ref 1.2–2.2)
Albumin: 4 g/dL (ref 3.5–5.5)
BILIRUBIN TOTAL: 0.5 mg/dL (ref 0.0–1.2)
BUN/Creatinine Ratio: 10 (ref 9–23)
BUN: 9 mg/dL (ref 6–24)
CHLORIDE: 104 mmol/L (ref 96–106)
CO2: 20 mmol/L (ref 20–29)
CREATININE: 0.93 mg/dL (ref 0.57–1.00)
Calcium: 8.9 mg/dL (ref 8.7–10.2)
GFR calc Af Amer: 88 mL/min/{1.73_m2} (ref >59)
GFR calc non Af Amer: 77 mL/min/{1.73_m2} (ref >59)
GLUCOSE: 143 mg/dL — AB (ref 65–99)
Globulin, Total: 3.4 g/dL (ref 1.5–4.5)
Potassium: 3.6 mmol/L (ref 3.5–5.2)
Sodium: 139 mmol/L (ref 134–144)
Total Protein: 7.4 g/dL (ref 6.0–8.5)

## 2017-08-06 ENCOUNTER — Other Ambulatory Visit: Payer: Self-pay | Admitting: Physician Assistant

## 2017-08-06 DIAGNOSIS — E282 Polycystic ovarian syndrome: Secondary | ICD-10-CM

## 2017-08-06 MED ORDER — METFORMIN HCL 500 MG PO TABS: 1000.0000 mg | ORAL_TABLET | Freq: Two times a day (BID) | ORAL | 11 refills | Status: DC

## 2017-11-04 ENCOUNTER — Ambulatory Visit: Payer: Self-pay | Admitting: Physician Assistant

## 2017-11-05 ENCOUNTER — Ambulatory Visit (INDEPENDENT_AMBULATORY_CARE_PROVIDER_SITE_OTHER): Payer: Medicaid - Out of State | Admitting: Physician Assistant

## 2017-11-05 ENCOUNTER — Encounter: Payer: Self-pay | Admitting: Physician Assistant

## 2017-11-05 VITALS — BP 138/85 | HR 90 | Temp 96.9°F | Ht 70.0 in | Wt 361.8 lb

## 2017-11-05 DIAGNOSIS — G4733 Obstructive sleep apnea (adult) (pediatric): Secondary | ICD-10-CM | POA: Diagnosis not present

## 2017-11-05 DIAGNOSIS — G4459 Other complicated headache syndrome: Secondary | ICD-10-CM | POA: Diagnosis not present

## 2017-11-05 DIAGNOSIS — G932 Benign intracranial hypertension: Secondary | ICD-10-CM

## 2017-11-05 MED ORDER — GABAPENTIN 400 MG PO CAPS: 400.0000 mg | ORAL_CAPSULE | Freq: Two times a day (BID) | ORAL | 11 refills | Status: DC

## 2017-11-05 MED ORDER — GABAPENTIN 400 MG PO CAPS: 400.0000 mg | ORAL_CAPSULE | Freq: Two times a day (BID) | ORAL | 5 refills | Status: DC

## 2017-11-05 MED ORDER — OXYCODONE HCL 10 MG PO TABS: 10.0000 mg | ORAL_TABLET | Freq: Four times a day (QID) | ORAL | 0 refills | Status: DC | PRN

## 2017-11-05 NOTE — Progress Notes (Signed)
BP 138/85   Pulse 90   Temp (!) 96.9 F (36.1 C) (Oral)   Ht 5\' 10"  (1.778 m)   Wt (!) 361 lb 12.8 oz (164.1 kg)   BMI 51.91 kg/m    Subjective:    Patient ID: Vanessa [name], female    DOB: 1976-01-01, 42 y.o.   MRN: [id number]  HPI: Vanessa [name] is a 42 y.o. female presenting on 11/05/2017 for Hypertension (3 month follow up )  Patient comes in for 10-month recheck on her chronic medical conditions.  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  She has had corrective surgery a couple of times now.  She does feel like her headaches have gotten better.  She has been able to only use one prescription of oxycodone over the past 3 months.  She does need a refill today.  She is taking her medication appropriately and doing very well with that.  Her blood pressure is starting to improve also she got diagnosed with sleep apnea and is wearing a CPAP now.  She has had some visual changes and will be going to a neuro-ophthalmologist in the near future.   Past Medical History:  Diagnosis Date  . Hypertension   . Migraines   . Pseudotumor (inflammatory) of orbit   . Vision loss    Relevant past medical, surgical, family and social history reviewed and updated as indicated. Interim medical history since our last visit reviewed. Allergies and medications reviewed and updated. DATA REVIEWED: CHART IN EPIC  Family History reviewed for pertinent findings.  Review of Systems  Constitutional: Negative.   HENT: Negative.   Eyes: Negative.   Respiratory: Negative.   Gastrointestinal: Negative.   Genitourinary: Negative.   Neurological: Positive for headaches. Negative for seizures, syncope, weakness and numbness.    Allergies as of 11/05/2017      Reactions   Metrizamide Other (See Comments), Itching, Photosensitivity, Shortness Of Breath, Swelling   update Migraine instantly   Mushroom Extract Complex Itching   Throat itching with cough   Peanut Oil  Anaphylaxis, Other (See Comments)   update   Peanut-containing Drug Products Itching   Itching throat with cough   Diflucan In Dextrose [fluconazole In Dextrose] Hives   Iodine-131 Other (See Comments)   update      Medication List        Accurate as of 11/05/17  9:26 AM. Always use your most recent med list.          acetaZOLAMIDE 250 MG tablet Commonly known as:  DIAMOX Take 2 tablets (500 mg total) by mouth 2 (two) times daily.   amitriptyline 50 MG tablet Commonly known as:  ELAVIL Take 1 tablet (50 mg total) by mouth daily.   amLODipine 10 MG tablet Commonly known as:  NORVASC Take 1 tablet (10 mg total) by mouth daily.   dextromethorphan-guaiFENesin 10-100 MG/5ML liquid Commonly known as:  ROBITUSSIN-DM Take 5 mLs by mouth every 6 (six) hours as needed for cough.   diphenhydrAMINE 25 mg capsule Commonly known as:  BENADRYL Take by mouth.   FLUoxetine 20 MG capsule Commonly known as:  PROZAC TAKE 1 CAPSULE (20 MG) BY ORAL ROUTE ONCE DAILY IN THE EVENING FOR 30 DAYS   gabapentin 400 MG capsule Commonly known as:  NEURONTIN Take 1 capsule (400 mg total) by mouth 2 (two) times daily.   hydrochlorothiazide 25 MG tablet Commonly known as:  HYDRODIURIL Take 1 tablet (25 mg total) by mouth daily.  lisinopril 40 MG tablet Commonly known as:  PRINIVIL,ZESTRIL Take 1 tablet (40 mg total) by mouth daily.   loratadine 10 MG tablet Commonly known as:  CLARITIN Take 1 tablet (10 mg total) by mouth daily.   metFORMIN 500 MG tablet Commonly known as:  GLUCOPHAGE Take 2 tablets (1,000 mg total) by mouth 2 (two) times daily with a meal.   ondansetron 4 MG tablet Commonly known as:  ZOFRAN Take by mouth.   Oxycodone HCl 10 MG Tabs Take 1 tablet (10 mg total) by mouth every 6 (six) hours as needed.   potassium chloride SA 20 MEQ tablet Commonly known as:  K-DUR,KLOR-CON Take 1 tablet (20 mEq total) by mouth daily.          Objective:    BP 138/85    Pulse 90   Temp (!) 96.9 F (36.1 C) (Oral)   Ht 5\' 10"  (1.778 m)   Wt (!) 361 lb 12.8 oz (164.1 kg)   BMI 51.91 kg/m   Allergies  Allergen Reactions  . Metrizamide Other (See Comments), Itching, Photosensitivity, Shortness Of Breath and Swelling    update Migraine instantly  . Mushroom Extract Complex Itching    Throat itching with cough  . Peanut Oil Anaphylaxis and Other (See Comments)    update  . Peanut-Containing Drug Products Itching    Itching throat with cough  . Diflucan In Dextrose [Fluconazole In Dextrose] Hives  . Iodine-131 Other (See Comments)    update    Wt Readings from Last 3 Encounters:  11/05/17 (!) 361 lb 12.8 oz (164.1 kg)  08/04/17 (!) 355 lb 3.2 oz (161.1 kg)  06/03/17 (!) 360 lb 3.2 oz (163.4 kg)    Physical Exam  Constitutional: She is oriented to person, place, and time. She appears well-developed and well-nourished.  HENT:  Head: Normocephalic and atraumatic.  Eyes: Pupils are equal, round, and reactive to light. Conjunctivae and EOM are normal.  Cardiovascular: Normal rate, regular rhythm, normal heart sounds and intact distal pulses.  Pulmonary/Chest: Effort normal and breath sounds normal.  Abdominal: Soft. Bowel sounds are normal.  Neurological: She is alert and oriented to person, place, and time. She has normal reflexes.  Skin: Skin is warm and dry. No rash noted.  Psychiatric: She has a normal mood and affect. Her behavior is normal. Judgment and thought content normal.        Assessment & Plan:   1. Other complicated headache syndrome - Oxycodone HCl 10 MG TABS; Take 1 tablet (10 mg total) by mouth every 6 (six) hours as needed.  Dispense: 120 tablet; Refill: 0 - gabapentin (NEURONTIN) 400 MG capsule; Take 1 capsule (400 mg total) by mouth 2 (two) times daily.  Dispense: 60 capsule; Refill: 5  2. Pseudotumor cerebri - Oxycodone HCl 10 MG TABS; Take 1 tablet (10 mg total) by mouth every 6 (six) hours as needed.  Dispense: 120  tablet; Refill: 0  3. Obstructive sleep apnea syndrome Continue medications for all other health problems and use CPAP regularly   Continue all other maintenance medications as listed above.  Follow up plan: Recheck in 3 months  Educational handout given for [place] PA-C [facility] [address]  [place], [place] [address] [phone]   11/05/2017, 9:26 AM

## 2017-11-10 ENCOUNTER — Encounter: Payer: Self-pay | Admitting: Physician Assistant

## 2017-12-04 ENCOUNTER — Other Ambulatory Visit: Payer: Self-pay | Admitting: *Deleted

## 2017-12-04 DIAGNOSIS — G4459 Other complicated headache syndrome: Secondary | ICD-10-CM

## 2017-12-04 DIAGNOSIS — E282 Polycystic ovarian syndrome: Secondary | ICD-10-CM

## 2017-12-04 DIAGNOSIS — G932 Benign intracranial hypertension: Secondary | ICD-10-CM

## 2017-12-04 DIAGNOSIS — G43809 Other migraine, not intractable, without status migrainosus: Secondary | ICD-10-CM

## 2017-12-04 DIAGNOSIS — I10 Essential (primary) hypertension: Secondary | ICD-10-CM

## 2017-12-04 MED ORDER — HYDROCHLOROTHIAZIDE 25 MG PO TABS: 25.0000 mg | ORAL_TABLET | Freq: Every day | ORAL | 0 refills | Status: DC

## 2017-12-04 MED ORDER — AMLODIPINE BESYLATE 10 MG PO TABS: 10.0000 mg | ORAL_TABLET | Freq: Every day | ORAL | 0 refills | Status: DC

## 2017-12-04 MED ORDER — LORATADINE 10 MG PO TABS: 10.0000 mg | ORAL_TABLET | Freq: Every day | ORAL | 0 refills | Status: DC

## 2017-12-04 MED ORDER — POTASSIUM CHLORIDE CRYS ER 20 MEQ PO TBCR: 20.0000 meq | EXTENDED_RELEASE_TABLET | Freq: Every day | ORAL | 0 refills | Status: DC

## 2017-12-04 MED ORDER — LISINOPRIL 40 MG PO TABS: 40.0000 mg | ORAL_TABLET | Freq: Every day | ORAL | 0 refills | Status: DC

## 2017-12-04 NOTE — Telephone Encounter (Signed)
Fax from [facility] - pt has started with mail order

## 2017-12-04 NOTE — Telephone Encounter (Signed)
Gabapentin refill x6 on 11/05/17 Amitriptyline was refilled on [id number] Metformin was refilled on 08/06/2017 for the year Diamox is filled on 08/04/2017 for the year

## 2017-12-08 MED ORDER — GABAPENTIN 400 MG PO CAPS: 400.0000 mg | ORAL_CAPSULE | Freq: Two times a day (BID) | ORAL | 5 refills | Status: DC

## 2017-12-08 MED ORDER — ACETAZOLAMIDE 250 MG PO TABS: 500.0000 mg | ORAL_TABLET | Freq: Two times a day (BID) | ORAL | 11 refills | Status: DC

## 2017-12-08 MED ORDER — AMITRIPTYLINE HCL 50 MG PO TABS: 50.0000 mg | ORAL_TABLET | Freq: Every day | ORAL | 11 refills | Status: DC

## 2017-12-08 MED ORDER — METFORMIN HCL 500 MG PO TABS: 1000.0000 mg | ORAL_TABLET | Freq: Two times a day (BID) | ORAL | 11 refills | Status: DC

## 2017-12-27 ENCOUNTER — Other Ambulatory Visit: Payer: Self-pay | Admitting: Physician Assistant

## 2017-12-27 DIAGNOSIS — I10 Essential (primary) hypertension: Secondary | ICD-10-CM

## 2018-01-07 ENCOUNTER — Other Ambulatory Visit: Payer: Self-pay | Admitting: Physician Assistant

## 2018-01-07 ENCOUNTER — Telehealth: Payer: Self-pay | Admitting: *Deleted

## 2018-01-07 MED ORDER — ATORVASTATIN CALCIUM 10 MG PO TABS: 10.0000 mg | ORAL_TABLET | Freq: Every day | ORAL | 3 refills | Status: DC

## 2018-01-07 NOTE — Telephone Encounter (Signed)
I have [facility] at[facility]atin 10 mg [facility], please let patient know she should be expecting it.

## 2018-01-07 NOTE — Telephone Encounter (Signed)
Aware.  Script was done.

## 2018-01-07 NOTE — Telephone Encounter (Signed)
TC from [facility] Medication consideration Statin therapy - is patient intolerant to statins or allergic to statins as to not being prescribed one since she has Dx of DM Please advise and call [facility] for their documentation

## 2018-01-14 ENCOUNTER — Encounter: Payer: Self-pay | Admitting: Physician Assistant

## 2018-01-14 ENCOUNTER — Ambulatory Visit (INDEPENDENT_AMBULATORY_CARE_PROVIDER_SITE_OTHER): Payer: Self-pay | Admitting: Physician Assistant

## 2018-01-14 VITALS — BP 142/84 | HR 100 | Temp 97.6°F | Ht 70.0 in | Wt 360.4 lb

## 2018-01-14 DIAGNOSIS — G4459 Other complicated headache syndrome: Secondary | ICD-10-CM

## 2018-01-14 DIAGNOSIS — G932 Benign intracranial hypertension: Secondary | ICD-10-CM

## 2018-01-14 DIAGNOSIS — G43809 Other migraine, not intractable, without status migrainosus: Secondary | ICD-10-CM

## 2018-01-14 DIAGNOSIS — I10 Essential (primary) hypertension: Secondary | ICD-10-CM

## 2018-01-14 DIAGNOSIS — M542 Cervicalgia: Secondary | ICD-10-CM

## 2018-01-14 MED ORDER — OXYCODONE HCL 10 MG PO TABS: 10.0000 mg | ORAL_TABLET | Freq: Four times a day (QID) | ORAL | 0 refills | Status: DC | PRN

## 2018-01-14 MED ORDER — METHYLPREDNISOLONE ACETATE 80 MG/ML IJ SUSP
80.0000 mg | Freq: Once | INTRAMUSCULAR | Status: AC
  Administered 2018-01-14: 80 mg via INTRAMUSCULAR

## 2018-01-14 MED ORDER — RIZATRIPTAN BENZOATE 10 MG PO TABS: 10.0000 mg | ORAL_TABLET | ORAL | 2 refills | Status: DC | PRN

## 2018-01-14 MED ORDER — CYCLOBENZAPRINE HCL 10 MG PO TABS: 10.0000 mg | ORAL_TABLET | Freq: Three times a day (TID) | ORAL | 0 refills | Status: DC | PRN

## 2018-01-17 DIAGNOSIS — G43909 Migraine, unspecified, not intractable, without status migrainosus: Secondary | ICD-10-CM | POA: Insufficient documentation

## 2018-01-17 NOTE — Progress Notes (Signed)
BP (!) 142/84   Pulse 100   Temp 97.6 F (36.4 C) (Oral)   Ht '5\' 10"'$  (1.778 m)   Wt (!) 360 lb 6.4 oz (163.5 kg)   BMI 51.71 kg/m    Subjective:    Patient ID: Vanessa [name], female    DOB: [birth year], 42 y.o.   MRN: [id number]  HPI: Vanessa [name] is a 42 y.o. female presenting on 01/14/2018 for Headache (x 3 weeks)  This patient comes in for periodic recheck on medications and conditions including pseudotumor cerebri, IIH, migraine, cervical pain.   All medications are reviewed today. There are no reports of any problems with the medications. All of the medical conditions are reviewed and updated.  Lab work is reviewed and will be ordered as medically necessary. There are no new problems reported with today's visit.   Past Medical History:  Diagnosis Date  . Hypertension   . Migraines   . Pseudotumor (inflammatory) of orbit   . Vision loss    Relevant past medical, surgical, family and social history reviewed and updated as indicated. Interim medical history since our last visit reviewed. Allergies and medications reviewed and updated. DATA REVIEWED: CHART IN EPIC  Family History reviewed for pertinent findings.  Review of Systems  Constitutional: Positive for fatigue.  HENT: Negative.   Eyes: Negative.   Respiratory: Negative.   Cardiovascular: Negative.   Gastrointestinal: Negative.   Genitourinary: Negative.   Neurological: Positive for headaches. Negative for dizziness, syncope and weakness.    Allergies as of 01/14/2018      Reactions   Metrizamide Other (See Comments), Itching, Photosensitivity, Shortness Of Breath, Swelling   update Migraine instantly   Mushroom Extract Complex Itching   Throat itching with cough   Peanut Oil Anaphylaxis, Other (See Comments)   update   Peanut-containing Drug Products Itching   Itching throat with cough   Diflucan In Dextrose [fluconazole In Dextrose] Hives   Iodine-131 Other (See Comments)   update        Medication List        Accurate as of 01/14/18 11:59 PM. Always use your most recent med list.          acetaZOLAMIDE 250 MG tablet Commonly known as:  DIAMOX Take 2 tablets (500 mg total) by mouth 2 (two) times daily.   amitriptyline 50 MG tablet Commonly known as:  ELAVIL Take 1 tablet (50 mg total) by mouth daily.   amLODipine 10 MG tablet Commonly known as:  NORVASC Take 1 tablet (10 mg total) by mouth daily.   atorvastatin 10 MG tablet Commonly known as:  LIPITOR Take 1 tablet (10 mg total) by mouth daily.   cyclobenzaprine 10 MG tablet Commonly known as:  FLEXERIL Take 1 tablet (10 mg total) by mouth 3 (three) times daily as needed for muscle spasms.   dextromethorphan-guaiFENesin 10-100 MG/5ML liquid Commonly known as:  ROBITUSSIN-DM Take 5 mLs by mouth every 6 (six) hours as needed for cough.   diphenhydrAMINE 25 mg capsule Commonly known as:  BENADRYL Take by mouth.   FLUoxetine 20 MG capsule Commonly known as:  PROZAC TAKE 1 CAPSULE (20 MG) BY ORAL ROUTE ONCE DAILY IN THE EVENING FOR 30 DAYS   gabapentin 400 MG capsule Commonly known as:  NEURONTIN Take 1 capsule (400 mg total) by mouth 2 (two) times daily.   hydrochlorothiazide 25 MG tablet Commonly known as:  HYDRODIURIL Take 1 tablet (25 mg total) by mouth daily.   lisinopril 40 MG  tablet Commonly known as:  PRINIVIL,ZESTRIL Take 1 tablet (40 mg total) by mouth daily.   loratadine 10 MG tablet Commonly known as:  CLARITIN Take 1 tablet (10 mg total) by mouth daily.   metFORMIN 500 MG tablet Commonly known as:  GLUCOPHAGE Take 2 tablets (1,000 mg total) by mouth 2 (two) times daily with a meal.   ondansetron 4 MG tablet Commonly known as:  ZOFRAN Take by mouth.   Oxycodone HCl 10 MG Tabs Take 1 tablet (10 mg total) by mouth every 6 (six) hours as needed.   potassium chloride SA 20 MEQ tablet Commonly known as:  K-DUR,KLOR-CON Take 1 tablet (20 mEq total) by mouth daily.    rizatriptan 10 MG tablet Commonly known as:  MAXALT Take 1 tablet (10 mg total) by mouth as needed for migraine. May repeat in 2 hours if needed   topiramate 25 MG tablet Commonly known as:  TOPAMAX          Objective:    BP (!) 142/84   Pulse 100   Temp 97.6 F (36.4 C) (Oral)   Ht '5\' 10"'$  (1.778 m)   Wt (!) 360 lb 6.4 oz (163.5 kg)   BMI 51.71 kg/m   Allergies  Allergen Reactions  . Metrizamide Other (See Comments), Itching, Photosensitivity, Shortness Of Breath and Swelling    update Migraine instantly  . Mushroom Extract Complex Itching    Throat itching with cough  . Peanut Oil Anaphylaxis and Other (See Comments)    update  . Peanut-Containing Drug Products Itching    Itching throat with cough  . Diflucan In Dextrose [Fluconazole In Dextrose] Hives  . Iodine-131 Other (See Comments)    update    Wt Readings from Last 3 Encounters:  01/14/18 (!) 360 lb 6.4 oz (163.5 kg)  11/05/17 (!) 361 lb 12.8 oz (164.1 kg)  08/04/17 (!) 355 lb 3.2 oz (161.1 kg)    Physical Exam  Constitutional: She is oriented to person, place, and time. She appears well-developed and well-nourished.  HENT:  Head: Normocephalic and atraumatic.  Eyes: Pupils are equal, round, and reactive to light. Conjunctivae and EOM are normal.  Cardiovascular: Normal rate, regular rhythm, normal heart sounds and intact distal pulses.  Pulmonary/Chest: Effort normal and breath sounds normal.  Abdominal: Soft. Bowel sounds are normal.  Neurological: She is alert and oriented to person, place, and time. She has normal reflexes.  Skin: Skin is warm and dry. No rash noted.  Psychiatric: She has a normal mood and affect. Her behavior is normal. Judgment and thought content normal.    Results for orders placed or performed in visit on 08/04/17  CMP14+EGFR  Result Value Ref Range   Glucose 143 (H) 65 - 99 mg/dL   BUN 9 6 - 24 mg/dL   Creatinine, Ser 0.93 0.57 - 1.00 mg/dL   GFR calc non Af Amer 77 >59  mL/min/1.73   GFR calc Af Amer 88 >59 mL/min/1.73   BUN/Creatinine Ratio 10 9 - 23   Sodium 139 134 - 144 mmol/L   Potassium 3.6 3.5 - 5.2 mmol/L   Chloride 104 96 - 106 mmol/L   CO2 20 20 - 29 mmol/L   Calcium 8.9 8.7 - 10.2 mg/dL   Total Protein 7.4 6.0 - 8.5 g/dL   Albumin 4.0 3.5 - 5.5 g/dL   Globulin, Total 3.4 1.5 - 4.5 g/dL   Albumin/Globulin Ratio 1.2 1.2 - 2.2   Bilirubin Total 0.5 0.0 - 1.2 mg/dL  Alkaline Phosphatase 50 39 - 117 IU/L   AST 18 0 - 40 IU/L   ALT 21 0 - 32 IU/L  Bayer DCA Hb A1c Waived  Result Value Ref Range   HB A1C (BAYER DCA - WAIVED) 8.2 (H) <7.0 %      Assessment & Plan:   1. Pseudotumor cerebri - Oxycodone HCl 10 MG TABS; Take 1 tablet (10 mg total) by mouth every 6 (six) hours as needed.  Dispense: 120 tablet; Refill: 0  2. IIH (idiopathic intracranial hypertension)  3. Essential hypertension  4. Other migraine without status migrainosus, not intractable - topiramate (TOPAMAX) 25 MG tablet - rizatriptan (MAXALT) 10 MG tablet; Take 1 tablet (10 mg total) by mouth as needed for migraine. May repeat in 2 hours if needed  Dispense: 10 tablet; Refill: 2 - methylPREDNISolone acetate (DEPO-MEDROL) injection 80 mg  5. Cervical pain - methylPREDNISolone acetate (DEPO-MEDROL) injection 80 mg - cyclobenzaprine (FLEXERIL) 10 MG tablet; Take 1 tablet (10 mg total) by mouth 3 (three) times daily as needed for muscle spasms.  Dispense: 30 tablet; Refill: 0  6. Other complicated headache syndrome - Oxycodone HCl 10 MG TABS; Take 1 tablet (10 mg total) by mouth every 6 (six) hours as needed.  Dispense: 120 tablet; Refill: 0   Continue all other maintenance medications as listed above.  Follow up plan: Return in about 4 weeks (around 02/11/2018) for recheck.  Educational handout given for [place] PA-C [facility] [address]  [place], [place] [address] [phone]   01/17/2018, 4:31 PM

## 2018-02-11 ENCOUNTER — Ambulatory Visit: Payer: Medicaid - Out of State | Admitting: Physician Assistant

## 2018-02-16 ENCOUNTER — Other Ambulatory Visit: Payer: Self-pay | Admitting: Physician Assistant

## 2018-02-16 DIAGNOSIS — I10 Essential (primary) hypertension: Secondary | ICD-10-CM

## 2018-03-31 ENCOUNTER — Ambulatory Visit (INDEPENDENT_AMBULATORY_CARE_PROVIDER_SITE_OTHER): Payer: Self-pay | Admitting: Physician Assistant

## 2018-03-31 ENCOUNTER — Encounter: Payer: Self-pay | Admitting: Physician Assistant

## 2018-03-31 VITALS — BP 155/88 | HR 100 | Temp 97.1°F | Ht 70.0 in | Wt 364.2 lb

## 2018-03-31 DIAGNOSIS — M542 Cervicalgia: Secondary | ICD-10-CM

## 2018-03-31 DIAGNOSIS — G4459 Other complicated headache syndrome: Secondary | ICD-10-CM

## 2018-03-31 DIAGNOSIS — G932 Benign intracranial hypertension: Secondary | ICD-10-CM

## 2018-03-31 DIAGNOSIS — G43809 Other migraine, not intractable, without status migrainosus: Secondary | ICD-10-CM

## 2018-03-31 DIAGNOSIS — I10 Essential (primary) hypertension: Secondary | ICD-10-CM

## 2018-03-31 DIAGNOSIS — E119 Type 2 diabetes mellitus without complications: Secondary | ICD-10-CM

## 2018-03-31 MED ORDER — LISINOPRIL 40 MG PO TABS: 40.0000 mg | ORAL_TABLET | Freq: Every day | ORAL | 0 refills | Status: DC

## 2018-03-31 MED ORDER — FLUOXETINE HCL 20 MG PO CAPS: ORAL_CAPSULE | ORAL | 11 refills | Status: DC

## 2018-03-31 MED ORDER — HYDROCHLOROTHIAZIDE 25 MG PO TABS: 25.0000 mg | ORAL_TABLET | Freq: Every day | ORAL | 3 refills | Status: DC

## 2018-03-31 MED ORDER — RIZATRIPTAN BENZOATE 10 MG PO TABS: 10.0000 mg | ORAL_TABLET | ORAL | 11 refills | Status: DC | PRN

## 2018-03-31 MED ORDER — AMLODIPINE BESYLATE 10 MG PO TABS: 10.0000 mg | ORAL_TABLET | Freq: Every day | ORAL | 3 refills | Status: DC

## 2018-03-31 MED ORDER — OXYCODONE HCL 10 MG PO TABS: 10.0000 mg | ORAL_TABLET | Freq: Four times a day (QID) | ORAL | 0 refills | Status: DC | PRN

## 2018-03-31 MED ORDER — CYCLOBENZAPRINE HCL 10 MG PO TABS: 10.0000 mg | ORAL_TABLET | Freq: Three times a day (TID) | ORAL | 0 refills | Status: DC | PRN

## 2018-03-31 MED ORDER — GABAPENTIN 400 MG PO CAPS: 400.0000 mg | ORAL_CAPSULE | Freq: Two times a day (BID) | ORAL | 5 refills | Status: DC

## 2018-03-31 MED ORDER — LORATADINE 10 MG PO TABS: 10.0000 mg | ORAL_TABLET | Freq: Every day | ORAL | 0 refills | Status: DC

## 2018-03-31 MED ORDER — POTASSIUM CHLORIDE CRYS ER 20 MEQ PO TBCR: 20.0000 meq | EXTENDED_RELEASE_TABLET | Freq: Every day | ORAL | 3 refills | Status: DC

## 2018-03-31 NOTE — Progress Notes (Addendum)
Glucometer #1, test strips 100, lancets 100. Prn refills DX; diabetes mellitus without complications    BP (!) 098/11   Pulse 100   Temp (!) 97.1 F (36.2 C) (Oral)   Ht '5\' 10"'$  (1.778 m)   Wt (!) 364 lb 3.2 oz (165.2 kg)   BMI 52.26 kg/m    Subjective:    Patient ID: Vanessa [name], female    DOB: [birth year], 43 y.o.   MRN: [id number]  HPI: Vanessa [name] is a 43 y.o. female presenting on 03/31/2018 for Hypertension (3 month ) and Medication Refill  This patient comes in for periodic recheck on medications and conditions including hypertension, migraine, diabetes, pseudotumor cerebri.  She is still seeing neurology and opthalmalogy.   All medications are reviewed today. There are no reports of any problems with the medications. All of the medical conditions are reviewed and updated.  Lab work is reviewed and will be ordered as medically necessary. There are no new problems reported with today's visit.  This patient returns for a 3 month recheck on narcotic use for chronic headache and medication refills  Patient currently taking oxycodone and gabapentin. Behavior- normal Medication side effects- none Any concerns- no  PMP AWARE website reviewed: Yes Any suspicious activity on PMP Aware: Yes   Past Medical History:  Diagnosis Date  . Hypertension   . Migraines   . Pseudotumor (inflammatory) of orbit   . Vision loss    Relevant past medical, surgical, family and social history reviewed and updated as indicated. Interim medical history since our last visit reviewed. Allergies and medications reviewed and updated. DATA REVIEWED: CHART IN EPIC  Family History reviewed for pertinent findings.  Review of Systems  Constitutional: Negative.  Negative for activity change, fatigue and fever.  HENT: Negative.   Eyes: Negative.   Respiratory: Negative.  Negative for cough.   Cardiovascular: Negative.  Negative for chest pain.  Gastrointestinal: Negative.  Negative for abdominal  pain.  Endocrine: Negative.   Genitourinary: Negative.  Negative for dysuria.  Musculoskeletal: Negative.   Skin: Negative.   Neurological: Negative.     Allergies as of 03/31/2018      Reactions   Metrizamide Other (See Comments), Itching, Photosensitivity, Shortness Of Breath, Swelling   update Migraine instantly   Mushroom Extract Complex Itching   Throat itching with cough   Peanut Oil Anaphylaxis, Other (See Comments)   update   Peanut-containing Drug Products Itching   Itching throat with cough   Diflucan In Dextrose [fluconazole In Dextrose] Hives   Iodine-131 Other (See Comments)   update      Medication List       Accurate as of March 31, 2018 11:59 PM. Always use your most recent med list.        acetaZOLAMIDE 250 MG tablet Commonly known as:  DIAMOX Take 2 tablets (500 mg total) by mouth 2 (two) times daily.   amitriptyline 50 MG tablet Commonly known as:  ELAVIL Take 1 tablet (50 mg total) by mouth daily.   amLODipine 10 MG tablet Commonly known as:  NORVASC Take 1 tablet (10 mg total) by mouth daily.   atorvastatin 10 MG tablet Commonly known as:  LIPITOR Take 1 tablet (10 mg total) by mouth daily.   cyclobenzaprine 10 MG tablet Commonly known as:  FLEXERIL Take 1 tablet (10 mg total) by mouth 3 (three) times daily as needed for muscle spasms.   FLUoxetine 20 MG capsule Commonly known as:  PROZAC TAKE 1 CAPSULE (20  MG) BY ORAL ROUTE ONCE DAILY IN THE EVENING FOR 30 DAYS   gabapentin 400 MG capsule Commonly known as:  NEURONTIN Take 1 capsule (400 mg total) by mouth 2 (two) times daily.   hydrochlorothiazide 25 MG tablet Commonly known as:  HYDRODIURIL Take 1 tablet (25 mg total) by mouth daily.   lisinopril 40 MG tablet Commonly known as:  PRINIVIL,ZESTRIL Take 1 tablet (40 mg total) by mouth daily.   loratadine 10 MG tablet Commonly known as:  CLARITIN Take 1 tablet (10 mg total) by mouth daily.   metFORMIN 500 MG tablet Commonly  known as:  GLUCOPHAGE Take 2 tablets (1,000 mg total) by mouth 2 (two) times daily with a meal.   ondansetron 4 MG tablet Commonly known as:  ZOFRAN Take by mouth.   Oxycodone HCl 10 MG Tabs Take 1 tablet (10 mg total) by mouth every 6 (six) hours as needed.   potassium chloride SA 20 MEQ tablet Commonly known as:  K-DUR,KLOR-CON Take 1 tablet (20 mEq total) by mouth daily.   rizatriptan 10 MG tablet Commonly known as:  MAXALT Take 1 tablet (10 mg total) by mouth as needed for migraine. May repeat in 2 hours if needed   topiramate 25 MG tablet Commonly known as:  TOPAMAX            Durable Medical Equipment  (From admission, onward)         Start     Ordered   03/31/18 0000  DME Other see comment    Comments:  Glucometer #1, test strips 100, lancets 100. Prn refills DX; diabetes mellitus without complications   55/[birth year]/41 1539             Objective:    BP (!) 155/88   Pulse 100   Temp (!) 97.1 F (36.2 C) (Oral)   Ht '5\' 10"'$  (1.778 m)   Wt (!) 364 lb 3.2 oz (165.2 kg)   BMI 52.26 kg/m   Allergies  Allergen Reactions  . Metrizamide Other (See Comments), Itching, Photosensitivity, Shortness Of Breath and Swelling    update Migraine instantly  . Mushroom Extract Complex Itching    Throat itching with cough  . Peanut Oil Anaphylaxis and Other (See Comments)    update  . Peanut-Containing Drug Products Itching    Itching throat with cough  . Diflucan In Dextrose [Fluconazole In Dextrose] Hives  . Iodine-131 Other (See Comments)    update    Wt Readings from Last 3 Encounters:  03/31/18 (!) 364 lb 3.2 oz (165.2 kg)  01/14/18 (!) 360 lb 6.4 oz (163.5 kg)  11/05/17 (!) 361 lb 12.8 oz (164.1 kg)    Physical Exam Constitutional:      Appearance: She is well-developed.  HENT:     Head: Normocephalic and atraumatic.  Eyes:     Conjunctiva/sclera: Conjunctivae normal.     Pupils: Pupils are equal, round, and reactive to light.  Cardiovascular:      Rate and Rhythm: Normal rate and regular rhythm.     Heart sounds: Normal heart sounds.  Pulmonary:     Effort: Pulmonary effort is normal.     Breath sounds: Normal breath sounds.  Abdominal:     General: Bowel sounds are normal.     Palpations: Abdomen is soft.  Skin:    General: Skin is warm and dry.     Findings: No rash.  Neurological:     Mental Status: She is alert and oriented to person,  place, and time.     Deep Tendon Reflexes: Reflexes are normal and symmetric.  Psychiatric:        Behavior: Behavior normal.        Thought Content: Thought content normal.        Judgment: Judgment normal.     Results for orders placed or performed in visit on 08/04/17  CMP14+EGFR  Result Value Ref Range   Glucose 143 (H) 65 - 99 mg/dL   BUN 9 6 - 24 mg/dL   Creatinine, Ser 0.93 0.57 - 1.00 mg/dL   GFR calc non Af Amer 77 >59 mL/min/1.73   GFR calc Af Amer 88 >59 mL/min/1.73   BUN/Creatinine Ratio 10 9 - 23   Sodium 139 134 - 144 mmol/L   Potassium 3.6 3.5 - 5.2 mmol/L   Chloride 104 96 - 106 mmol/L   CO2 20 20 - 29 mmol/L   Calcium 8.9 8.7 - 10.2 mg/dL   Total Protein 7.4 6.0 - 8.5 g/dL   Albumin 4.0 3.5 - 5.5 g/dL   Globulin, Total 3.4 1.5 - 4.5 g/dL   Albumin/Globulin Ratio 1.2 1.2 - 2.2   Bilirubin Total 0.5 0.0 - 1.2 mg/dL   Alkaline Phosphatase 50 39 - 117 IU/L   AST 18 0 - 40 IU/L   ALT 21 0 - 32 IU/L  Bayer DCA Hb A1c Waived  Result Value Ref Range   HB A1C (BAYER DCA - WAIVED) 8.2 (H) <7.0 %      Assessment & Plan:   1. Essential hypertension - potassium chloride SA (K-DUR,KLOR-CON) 20 MEQ tablet; Take 1 tablet (20 mEq total) by mouth daily.  Dispense: 90 tablet; Refill: 3 - lisinopril (PRINIVIL,ZESTRIL) 40 MG tablet; Take 1 tablet (40 mg total) by mouth daily.  Dispense: 90 tablet; Refill: 0 - amLODipine (NORVASC) 10 MG tablet; Take 1 tablet (10 mg total) by mouth daily.  Dispense: 90 tablet; Refill: 3 - hydrochlorothiazide (HYDRODIURIL) 25 MG tablet; Take 1  tablet (25 mg total) by mouth daily.  Dispense: 90 tablet; Refill: 3  2. Other complicated headache syndrome - gabapentin (NEURONTIN) 400 MG capsule; Take 1 capsule (400 mg total) by mouth 2 (two) times daily.  Dispense: 60 capsule; Refill: 5 - Oxycodone HCl 10 MG TABS; Take 1 tablet (10 mg total) by mouth every 6 (six) hours as needed.  Dispense: 120 tablet; Refill: 0  3. Other migraine without status migrainosus, not intractable - rizatriptan (MAXALT) 10 MG tablet; Take 1 tablet (10 mg total) by mouth as needed for migraine. May repeat in 2 hours if needed  Dispense: 10 tablet; Refill: 11  4. Pseudotumor cerebri - Oxycodone HCl 10 MG TABS; Take 1 tablet (10 mg total) by mouth every 6 (six) hours as needed.  Dispense: 120 tablet; Refill: 0  5. Cervical pain - cyclobenzaprine (FLEXERIL) 10 MG tablet; Take 1 tablet (10 mg total) by mouth 3 (three) times daily as needed for muscle spasms.  Dispense: 60 tablet; Refill: 0  6. Diabetes mellitus without complication ([place]) - DME Other see comment   Continue all other maintenance medications as listed above.  Follow up plan: Return in about 3 months (around 06/29/2018) for recheck.  Educational handout given for [place] PA-C [facility] [address]  [place], [place] [address] [phone]   04/03/2018, 12:19 PM

## 2018-04-03 ENCOUNTER — Telehealth: Payer: Self-pay | Admitting: Physician Assistant

## 2018-04-03 NOTE — Telephone Encounter (Signed)
Returned patient's phone call.  Patient states that her blood sugars have been in the 300-400 range since 03/31/2018 with an average of 339.  She has been taking metformin and wants to know what else she can do to get her BS down

## 2018-04-03 NOTE — Telephone Encounter (Signed)
PT has called states when testing her sugar level it has been 300-400 level lowest it has been is 285.   PT is taking Metformin currently   Pt is wanting to know what else can be done to get sugar levels down.   Pharmacy: [facility]

## 2018-04-03 NOTE — Telephone Encounter (Signed)
Patient aware of recommendations.  

## 2018-04-03 NOTE — Telephone Encounter (Signed)
Diet modification, increase water intake. Make an appointment for reevaluation and adjustment of medications.

## 2018-04-06 ENCOUNTER — Other Ambulatory Visit: Payer: Self-pay | Admitting: Physician Assistant

## 2018-04-06 ENCOUNTER — Telehealth: Payer: Self-pay | Admitting: Physician Assistant

## 2018-04-06 MED ORDER — SITAGLIPTIN PHOSPHATE 50 MG PO TABS: 50.0000 mg | ORAL_TABLET | Freq: Every day | ORAL | 2 refills | Status: DC

## 2018-04-06 NOTE — Telephone Encounter (Signed)
Patient aware.

## 2018-04-06 NOTE — Telephone Encounter (Signed)
lmtcb-cb 2/10

## 2018-04-06 NOTE — Telephone Encounter (Signed)
Add [place] one daily. Script sent to pharmacy. A1c will need to be rechecked on regular time

## 2018-04-06 NOTE — Telephone Encounter (Signed)
Patient states that's her BS have been fluctuating between 138-400 range with increasing water intake and low carb diet.  Higher first thing in the morning.  Patient also states that she went to the hospital because BS were so elevated and that when checked at hospital it was 138 and her machine said 179.  Patient bought new glucometer and checked in front of pharmacist and BS was 309 at that time. Patient wants to know what to do

## 2018-04-15 ENCOUNTER — Other Ambulatory Visit: Payer: Self-pay | Admitting: Physician Assistant

## 2018-04-15 DIAGNOSIS — G4459 Other complicated headache syndrome: Secondary | ICD-10-CM

## 2018-04-17 ENCOUNTER — Other Ambulatory Visit: Payer: Self-pay | Admitting: Physician Assistant

## 2018-04-17 MED ORDER — GLUCOSE BLOOD VI STRP: ORAL_STRIP | 12 refills | Status: DC

## 2018-04-17 NOTE — Telephone Encounter (Signed)
Test strips sent.  Patient aware  

## 2018-04-17 NOTE — Telephone Encounter (Signed)
What is the name of the medication? One Touch Ultra 2 test strips. Having to check blood sugar more  Have you contacted your pharmacy to request a refill? NO  Which pharmacy would you like this sent to? [facility] [place]   Patient notified that their request is being sent to the clinical staff for review and that they should receive a call once it is complete. If they do not receive a call within 24 hours they can check with their pharmacy or our office.

## 2018-05-05 ENCOUNTER — Other Ambulatory Visit: Payer: Self-pay | Admitting: Physician Assistant

## 2018-05-05 DIAGNOSIS — G4459 Other complicated headache syndrome: Secondary | ICD-10-CM

## 2018-05-05 MED ORDER — GABAPENTIN 400 MG PO CAPS: 400.0000 mg | ORAL_CAPSULE | Freq: Two times a day (BID) | ORAL | 5 refills | Status: DC

## 2018-05-15 ENCOUNTER — Other Ambulatory Visit: Payer: Self-pay | Admitting: Physician Assistant

## 2018-05-15 DIAGNOSIS — E282 Polycystic ovarian syndrome: Secondary | ICD-10-CM

## 2018-05-22 ENCOUNTER — Telehealth: Payer: Self-pay | Admitting: Physician Assistant

## 2018-05-22 ENCOUNTER — Other Ambulatory Visit: Payer: Self-pay | Admitting: Physician Assistant

## 2018-05-22 MED ORDER — CETIRIZINE HCL 10 MG PO TABS: 10.0000 mg | ORAL_TABLET | Freq: Every day | ORAL | 11 refills | Status: DC

## 2018-05-22 MED ORDER — MONTELUKAST SODIUM 10 MG PO TABS: 10.0000 mg | ORAL_TABLET | Freq: Every day | ORAL | 3 refills | Status: DC

## 2018-05-22 MED ORDER — CEFDINIR 300 MG PO CAPS: 300.0000 mg | ORAL_CAPSULE | Freq: Two times a day (BID) | ORAL | 0 refills | Status: DC

## 2018-05-22 NOTE — Telephone Encounter (Signed)
Omnicef, zyrtec and singulair sent for allergy and infection

## 2018-05-22 NOTE — Telephone Encounter (Signed)
[facility]  SWITCHBOARD  SICK CALL SCREENING   05/22/2018  Vanessa [name]    OAC:[id number]    DOB:[birth year]  Best Contact Telephone Number: [phone]    3. Symptoms: Cough, chest tightness like she needs to cough up something  2. Symptom Onset: 05/20/2018  3. Have you traveled any over the past 14 days? NO  4.   Have you been in recent contact with someone that has tested positive for COVID-19? NO  5.   Which pharmacy would you use today if given a prescription? [facility]   Follow-Up Plan Vanessa [name] was informed that this information will be given to a clinical staff member to review and that they should receive a follow-up telephone call within 24 hours. she was advised to call back if she develops any new symptoms or if her current symptoms worsen.   Screened by: Sandra Cockayne

## 2018-06-14 ENCOUNTER — Other Ambulatory Visit: Payer: Self-pay | Admitting: Physician Assistant

## 2018-06-14 DIAGNOSIS — E282 Polycystic ovarian syndrome: Secondary | ICD-10-CM

## 2018-06-27 ENCOUNTER — Other Ambulatory Visit: Payer: Self-pay | Admitting: Physician Assistant

## 2018-06-27 DIAGNOSIS — G43809 Other migraine, not intractable, without status migrainosus: Secondary | ICD-10-CM

## 2018-06-29 ENCOUNTER — Other Ambulatory Visit: Payer: Self-pay

## 2018-06-29 ENCOUNTER — Encounter: Payer: Self-pay | Admitting: Physician Assistant

## 2018-06-29 ENCOUNTER — Ambulatory Visit (INDEPENDENT_AMBULATORY_CARE_PROVIDER_SITE_OTHER): Payer: Medicaid - Out of State | Admitting: Physician Assistant

## 2018-06-29 VITALS — BP 122/74 | HR 91 | Temp 97.9°F | Ht 70.0 in | Wt 362.8 lb

## 2018-06-29 DIAGNOSIS — E282 Polycystic ovarian syndrome: Secondary | ICD-10-CM

## 2018-06-29 DIAGNOSIS — G4459 Other complicated headache syndrome: Secondary | ICD-10-CM | POA: Diagnosis not present

## 2018-06-29 DIAGNOSIS — G932 Benign intracranial hypertension: Secondary | ICD-10-CM

## 2018-06-29 DIAGNOSIS — L03115 Cellulitis of right lower limb: Secondary | ICD-10-CM

## 2018-06-29 DIAGNOSIS — E119 Type 2 diabetes mellitus without complications: Secondary | ICD-10-CM

## 2018-06-29 DIAGNOSIS — I10 Essential (primary) hypertension: Secondary | ICD-10-CM

## 2018-06-29 LAB — BAYER DCA HB A1C WAIVED: HB A1C (BAYER DCA - WAIVED): 7.4 % — ABNORMAL HIGH (ref <7.0)

## 2018-06-29 MED ORDER — OXYCODONE HCL 10 MG PO TABS: 10.0000 mg | ORAL_TABLET | Freq: Four times a day (QID) | ORAL | 0 refills | Status: DC | PRN

## 2018-06-29 MED ORDER — HALOBETASOL PROPIONATE 0.05 % EX CREA: TOPICAL_CREAM | Freq: Two times a day (BID) | CUTANEOUS | 5 refills | Status: DC

## 2018-06-29 MED ORDER — FUROSEMIDE 20 MG PO TABS: 20.0000 mg | ORAL_TABLET | Freq: Every day | ORAL | 6 refills | Status: DC

## 2018-06-29 MED ORDER — METFORMIN HCL 500 MG PO TABS: 1000.0000 mg | ORAL_TABLET | Freq: Two times a day (BID) | ORAL | 3 refills | Status: DC

## 2018-06-29 MED ORDER — LISINOPRIL 40 MG PO TABS: 40.0000 mg | ORAL_TABLET | Freq: Every day | ORAL | 1 refills | Status: DC

## 2018-06-29 MED ORDER — GABAPENTIN 100 MG PO CAPS: 100.0000 mg | ORAL_CAPSULE | Freq: Two times a day (BID) | ORAL | 3 refills | Status: DC

## 2018-06-29 MED ORDER — CEPHALEXIN 500 MG PO CAPS: 500.0000 mg | ORAL_CAPSULE | Freq: Four times a day (QID) | ORAL | 0 refills | Status: DC

## 2018-06-29 MED ORDER — SITAGLIPTIN PHOSPHATE 50 MG PO TABS: 50.0000 mg | ORAL_TABLET | Freq: Every day | ORAL | 1 refills | Status: DC

## 2018-07-01 LAB — CMP14+EGFR
ALT: 21 IU/L (ref 0–32)
AST: 20 IU/L (ref 0–40)
Albumin/Globulin Ratio: 1.3 (ref 1.2–2.2)
Albumin: 4.3 g/dL (ref 3.8–4.8)
Alkaline Phosphatase: 47 IU/L (ref 39–117)
BUN/Creatinine Ratio: 16 (ref 9–23)
BUN: 17 mg/dL (ref 6–24)
Bilirubin Total: 0.3 mg/dL (ref 0.0–1.2)
CO2: 16 mmol/L — ABNORMAL LOW (ref 20–29)
Calcium: 9 mg/dL (ref 8.7–10.2)
Chloride: 105 mmol/L (ref 96–106)
Creatinine, Ser: 1.09 mg/dL — ABNORMAL HIGH (ref 0.57–1.00)
GFR calc Af Amer: 72 mL/min/{1.73_m2} (ref >59)
GFR calc non Af Amer: 63 mL/min/{1.73_m2} (ref >59)
Globulin, Total: 3.2 g/dL (ref 1.5–4.5)
Glucose: 127 mg/dL — ABNORMAL HIGH (ref 65–99)
Potassium: 3.7 mmol/L (ref 3.5–5.2)
Sodium: 137 mmol/L (ref 134–144)
Total Protein: 7.5 g/dL (ref 6.0–8.5)

## 2018-07-01 NOTE — Progress Notes (Signed)
BP 122/74   Pulse 91   Temp 97.9 F (36.6 C) (Oral)   Ht '5\' 10"'$  (1.778 m)   Wt (!) 362 lb 12.8 oz (164.6 kg)   BMI 52.06 kg/m    Subjective:    Patient ID: Vanessa [name], female    DOB: 20-Jun-1975, 43 y.o.   MRN: [id number]  HPI: Vanessa [name] is a 43 y.o. female presenting on 06/29/2018 for Diabetes  This patient comes in for chronic recheck on all of her medical conditions.  She does have diabetes and she has been trying very diligently to work on diet and exercise.  We will have an A1c performed today.  She states she is not seeing any high sugars when she checks them  She continues with chronic headaches.  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  She has not been able to see her neurologist because her insurance is not credentialed with him.  She is still taking her medications.  Her blood pressure has been much improved.  She is taking her medication and watching her sodium.  She is also having a flareup of of cellulitis on her right leg.  She has known eczema.  But it some redness and weeping.  PAIN ASSESSMENT: Cause of pain- headache  This patient returns for a 3 month recheck on narcotic use for the above named conditions  Current medications-oxycodone 10 mg 1 4 times a day as needed for severe pain Medication side effects-no Any concerns-no   Pain on scale of 1-10- 8 Frequency-3-4 times per week What makes pain Better-rest, dark room Effects on ADL -moderate some days Any change in general medical condition-no  Effectiveness of current meds-good Adverse reactions form pain meds-no PMP AWARE website reviewed: Yes Any suspicious activity on PMP Aware: No MME daily dose: 30. intermittently*  Contract on file 06/29/2018 Last UDS  06/29/2018  History of overdose or risk of abuse no   Past Medical History:  Diagnosis Date  . Hypertension   . Migraines   . Pseudotumor (inflammatory) of orbit   . Vision loss    Relevant past medical, surgical, family and  social history reviewed and updated as indicated. Interim medical history since our last visit reviewed. Allergies and medications reviewed and updated. DATA REVIEWED: CHART IN EPIC  Family History reviewed for pertinent findings.  Review of Systems  Constitutional: Negative.   HENT: Negative.   Eyes: Negative.   Respiratory: Negative.   Gastrointestinal: Negative.   Genitourinary: Negative.   Musculoskeletal: Positive for arthralgias.  Skin: Positive for color change and rash.  Neurological: Positive for headaches. Negative for syncope and weakness.    Allergies as of 06/29/2018      Reactions   Metrizamide Other (See Comments), Itching, Photosensitivity, Shortness Of Breath, Swelling   update Migraine instantly   Mushroom Extract Complex Itching   Throat itching with cough   Peanut Oil Anaphylaxis, Other (See Comments)   update   Peanut-containing Drug Products Itching   Itching throat with cough   Diflucan In Dextrose [fluconazole In Dextrose] Hives   Iodine-131 Other (See Comments)   update      Medication List       Accurate as of Jun 29, 2018 11:59 PM. If you have any questions, ask your nurse or doctor.        STOP taking these medications   cefdinir 300 MG capsule Commonly known as:  OMNICEF Stopped by:  Terald Sleeper, PA-C   hydrochlorothiazide 25 MG tablet Commonly  known as:  HYDRODIURIL Stopped by:  Terald Sleeper, PA-C   loratadine 10 MG tablet Commonly known as:  CLARITIN Stopped by:  Terald Sleeper, PA-C   ondansetron 4 MG tablet Commonly known as:  ZOFRAN Stopped by:  Terald Sleeper, PA-C     TAKE these medications   acetaZOLAMIDE 250 MG tablet Commonly known as:  DIAMOX Take 2 tablets (500 mg total) by mouth 2 (two) times daily.   amitriptyline 50 MG tablet Commonly known as:  ELAVIL Take 1 tablet (50 mg total) by mouth daily.   amLODipine 10 MG tablet Commonly known as:  NORVASC Take 1 tablet (10 mg total) by mouth daily.    atorvastatin 10 MG tablet Commonly known as:  LIPITOR Take 1 tablet (10 mg total) by mouth daily.   cephALEXin 500 MG capsule Commonly known as:  KEFLEX Take 1 capsule (500 mg total) by mouth 4 (four) times daily. Started by:  Terald Sleeper, PA-C   cetirizine 10 MG tablet Commonly known as:  ZYRTEC Take 1 tablet (10 mg total) by mouth daily.   cyclobenzaprine 10 MG tablet Commonly known as:  FLEXERIL Take 1 tablet (10 mg total) by mouth 3 (three) times daily as needed for muscle spasms.   FLUoxetine 20 MG capsule Commonly known as:  PROZAC TAKE 1 CAPSULE (20 MG) BY ORAL ROUTE ONCE DAILY IN THE EVENING FOR 30 DAYS   furosemide 20 MG tablet Commonly known as:  LASIX Take 1 tablet (20 mg total) by mouth daily. Started by:  Terald Sleeper, PA-C   gabapentin 100 MG capsule Commonly known as:  NEURONTIN Take 1-3 capsules (100-300 mg total) by mouth 2 (two) times daily. What changed:    medication strength  how much to take Changed by:  Terald Sleeper, PA-C   glucose blood test strip Commonly known as:  ONE TOUCH ULTRA TEST Use as instructed   halobetasol 0.05 % cream Commonly known as:  ULTRAVATE Apply topically 2 (two) times daily. Started by:  Terald Sleeper, PA-C   lisinopril 40 MG tablet Commonly known as:  ZESTRIL Take 1 tablet (40 mg total) by mouth daily.   metFORMIN 500 MG tablet Commonly known as:  GLUCOPHAGE Take 2 tablets (1,000 mg total) by mouth 2 (two) times daily with a meal.   montelukast 10 MG tablet Commonly known as:  SINGULAIR Take 1 tablet (10 mg total) by mouth at bedtime.   ONE TOUCH ULTRA 2 w/Device Kit Test once daily Dx E11.9   Oxycodone HCl 10 MG Tabs Take 1 tablet (10 mg total) by mouth every 6 (six) hours as needed. What changed:  Another medication with the same name was added. Make sure you understand how and when to take each. Changed by:  Terald Sleeper, PA-C   Oxycodone HCl 10 MG Tabs Take 1 tablet (10 mg total) by mouth 4  (four) times daily as needed (pain/headache). What changed:  You were already taking a medication with the same name, and this prescription was added. Make sure you understand how and when to take each. Changed by:  Terald Sleeper, PA-C   potassium chloride SA 20 MEQ tablet Commonly known as:  K-DUR Take 1 tablet (20 mEq total) by mouth daily.   rizatriptan 10 MG tablet Commonly known as:  MAXALT TAKE 1 TABLET BY MOUTH AS NEEDED FOR MIGRAINE. MAY REPEAT IN 2 HOURS IF NEEDED   sitaGLIPtin 50 MG tablet Commonly known as:  Januvia  Take 1 tablet (50 mg total) by mouth daily. What changed:  how much to take Changed by:  Terald Sleeper, PA-C   topiramate 25 MG tablet Commonly known as:  TOPAMAX          Objective:    BP 122/74   Pulse 91   Temp 97.9 F (36.6 C) (Oral)   Ht '5\' 10"'$  (1.778 m)   Wt (!) 362 lb 12.8 oz (164.6 kg)   BMI 52.06 kg/m   Allergies  Allergen Reactions  . Metrizamide Other (See Comments), Itching, Photosensitivity, Shortness Of Breath and Swelling    update Migraine instantly  . Mushroom Extract Complex Itching    Throat itching with cough  . Peanut Oil Anaphylaxis and Other (See Comments)    update  . Peanut-Containing Drug Products Itching    Itching throat with cough  . Diflucan In Dextrose [Fluconazole In Dextrose] Hives  . Iodine-131 Other (See Comments)    update    Wt Readings from Last 3 Encounters:  06/29/18 (!) 362 lb 12.8 oz (164.6 kg)  03/31/18 (!) 364 lb 3.2 oz (165.2 kg)  01/14/18 (!) 360 lb 6.4 oz (163.5 kg)    Physical Exam Constitutional:      Appearance: She is well-developed.  HENT:     Head: Normocephalic and atraumatic.  Eyes:     Conjunctiva/sclera: Conjunctivae normal.     Pupils: Pupils are equal, round, and reactive to light.  Cardiovascular:     Rate and Rhythm: Normal rate and regular rhythm.     Heart sounds: Normal heart sounds.  Pulmonary:     Effort: Pulmonary effort is normal.     Breath sounds: Normal  breath sounds.  Abdominal:     General: Bowel sounds are normal.     Palpations: Abdomen is soft.  Skin:    General: Skin is warm and dry.     Findings: Rash present. Rash is crusting and scaling.       Neurological:     Mental Status: She is alert and oriented to person, place, and time.     Deep Tendon Reflexes: Reflexes are normal and symmetric.  Psychiatric:        Behavior: Behavior normal.        Thought Content: Thought content normal.        Judgment: Judgment normal.     Results for orders placed or performed in visit on 06/29/18  CMP14+EGFR  Result Value Ref Range   Glucose 127 (H) 65 - 99 mg/dL   BUN 17 6 - 24 mg/dL   Creatinine, Ser 1.09 (H) 0.57 - 1.00 mg/dL   GFR calc non Af Amer 63 >59 mL/min/1.73   GFR calc Af Amer 72 >59 mL/min/1.73   BUN/Creatinine Ratio 16 9 - 23   Sodium 137 134 - 144 mmol/L   Potassium 3.7 3.5 - 5.2 mmol/L   Chloride 105 96 - 106 mmol/L   CO2 16 (L) 20 - 29 mmol/L   Calcium 9.0 8.7 - 10.2 mg/dL   Total Protein 7.5 6.0 - 8.5 g/dL   Albumin 4.3 3.8 - 4.8 g/dL   Globulin, Total 3.2 1.5 - 4.5 g/dL   Albumin/Globulin Ratio 1.3 1.2 - 2.2   Bilirubin Total 0.3 0.0 - 1.2 mg/dL   Alkaline Phosphatase 47 39 - 117 IU/L   AST 20 0 - 40 IU/L   ALT 21 0 - 32 IU/L  Bayer DCA Hb A1c Waived  Result Value Ref Range  HB A1C (BAYER DCA - WAIVED) 7.4 (H) <7.0 %      Assessment & Plan:   1. Other complicated headache syndrome - gabapentin (NEURONTIN) 100 MG capsule; Take 1-3 capsules (100-300 mg total) by mouth 2 (two) times daily.  Dispense: 180 capsule; Refill: 3 - ToxASSURE Select 13 (MW), Urine - Oxycodone HCl 10 MG TABS; Take 1 tablet (10 mg total) by mouth every 6 (six) hours as needed.  Dispense: 120 tablet; Refill: 0 - Oxycodone HCl 10 MG TABS; Take 1 tablet (10 mg total) by mouth 4 (four) times daily as needed (pain/headache).  Dispense: 120 tablet; Refill: 0  2. PCOS (polycystic ovarian syndrome) - metFORMIN (GLUCOPHAGE) 500 MG tablet;  Take 2 tablets (1,000 mg total) by mouth 2 (two) times daily with a meal.  Dispense: 180 tablet; Refill: 3  3. Essential hypertension - lisinopril (ZESTRIL) 40 MG tablet; Take 1 tablet (40 mg total) by mouth daily.  Dispense: 90 tablet; Refill: 1 - CMP14+EGFR - Bayer DCA Hb A1c Waived  4. Pseudotumor cerebri - Oxycodone HCl 10 MG TABS; Take 1 tablet (10 mg total) by mouth every 6 (six) hours as needed.  Dispense: 120 tablet; Refill: 0  5. Diabetes mellitus without complication ([place]) - Bayer DCA Hb A1c Waived  6. Cellulitis of right lower extremity - cephALEXin (KEFLEX) 500 MG capsule; Take 1 capsule (500 mg total) by mouth 4 (four) times daily.  Dispense: 40 capsule; Refill: 0 - halobetasol (ULTRAVATE) 0.05 % cream; Apply topically 2 (two) times daily.  Dispense: 50 g; Refill: 5   Continue all other maintenance medications as listed above.  Follow up plan: No follow-ups on file.  Educational handout given for [place] PA-C [facility] [address]  [place], [place] [address] [phone]   07/01/2018, 4:16 PM

## 2018-07-02 LAB — TOXASSURE SELECT 13 (MW), URINE

## 2018-07-09 ENCOUNTER — Other Ambulatory Visit: Payer: Self-pay | Admitting: Physician Assistant

## 2018-07-15 ENCOUNTER — Telehealth: Payer: Self-pay | Admitting: *Deleted

## 2018-07-15 NOTE — Telephone Encounter (Signed)
Pharmacy aware

## 2018-07-15 NOTE — Telephone Encounter (Signed)
VM from [facility] regarding duplicate therapy Loratidine and Cetirizine Please advise Call back number [phone]

## 2018-07-15 NOTE — Telephone Encounter (Signed)
She has hives and takes loratadine in the AM, zyrtec in the PM

## 2018-07-16 ENCOUNTER — Other Ambulatory Visit: Payer: Self-pay | Admitting: *Deleted

## 2018-07-16 MED ORDER — GLUCOSE BLOOD VI STRP: ORAL_STRIP | 3 refills | Status: DC

## 2018-07-16 MED ORDER — ONETOUCH DELICA LANCETS 30G MISC: 1.0000 | Freq: Every day | 3 refills | Status: DC

## 2018-07-23 ENCOUNTER — Other Ambulatory Visit: Payer: Self-pay | Admitting: *Deleted

## 2018-07-27 ENCOUNTER — Other Ambulatory Visit: Payer: Self-pay | Admitting: *Deleted

## 2018-07-27 DIAGNOSIS — G43809 Other migraine, not intractable, without status migrainosus: Secondary | ICD-10-CM

## 2018-07-27 MED ORDER — RIZATRIPTAN BENZOATE 10 MG PO TABS: ORAL_TABLET | ORAL | 2 refills | Status: DC

## 2018-08-13 ENCOUNTER — Other Ambulatory Visit: Payer: Self-pay | Admitting: Physician Assistant

## 2018-09-02 ENCOUNTER — Encounter: Payer: Self-pay | Admitting: Physician Assistant

## 2018-09-02 ENCOUNTER — Ambulatory Visit (INDEPENDENT_AMBULATORY_CARE_PROVIDER_SITE_OTHER): Payer: Self-pay | Admitting: Physician Assistant

## 2018-09-02 ENCOUNTER — Other Ambulatory Visit: Payer: Self-pay

## 2018-09-02 DIAGNOSIS — Z8739 Personal history of other diseases of the musculoskeletal system and connective tissue: Secondary | ICD-10-CM

## 2018-09-02 DIAGNOSIS — M25571 Pain in right ankle and joints of right foot: Secondary | ICD-10-CM

## 2018-09-02 DIAGNOSIS — M109 Gout, unspecified: Secondary | ICD-10-CM

## 2018-09-02 MED ORDER — ALLOPURINOL 300 MG PO TABS: 300.0000 mg | ORAL_TABLET | Freq: Every day | ORAL | 6 refills | Status: DC

## 2018-09-02 MED ORDER — PREDNISONE 10 MG (21) PO TBPK: ORAL_TABLET | ORAL | 0 refills | Status: DC

## 2018-09-07 NOTE — Progress Notes (Signed)
Telephone visit  Subjective: CC: Foot pain and knee pain PCP: Terald Sleeper, PA-C IFO:Vanessa [name] is a 43 y.o. female calls for telephone consult today. Patient provides verbal consent for consult held via phone.  Patient is identified with 2 separate identifiers.  At this time the entire area is on COVID-19 social distancing and stay home orders are in place.  Patient is of higher risk and therefore we are performing this by a virtual method.  Location of patient: Home Location of provider: [facility] Others present for call: No  Patient reports she is having a flare of of her ankles.  Her right foot has been very swollen for about a week.  It hurts to walk on it.  Even her left knee is starting to hurt because she is walking differently because of her foot.  She also was on some stairs and twisted her knee and it did have a lot of more pain.  There is warmth and redness to the ankle.  She does have a family history of gout.    ROS: Per HPI  Allergies  Allergen Reactions  . Metrizamide Other (See Comments), Itching, Photosensitivity, Shortness Of Breath and Swelling    update Migraine instantly  . Mushroom Extract Complex Itching    Throat itching with cough  . Peanut Oil Anaphylaxis and Other (See Comments)    update  . Peanut-Containing Drug Products Itching    Itching throat with cough  . Diflucan In Dextrose [Fluconazole In Dextrose] Hives  . Iodine-131 Other (See Comments)    update   Past Medical History:  Diagnosis Date  . Hypertension   . Migraines   . Pseudotumor (inflammatory) of orbit   . Vision loss     Current Outpatient Medications:  .  acetaZOLAMIDE (DIAMOX) 250 MG tablet, Take 2 tablets (500 mg total) by mouth 2 (two) times daily., Disp: 120 tablet, Rfl: 11 .  allopurinol (ZYLOPRIM) 300 MG tablet, Take 1 tablet (300 mg total) by mouth daily., Disp: 30 tablet, Rfl: 6 .  amitriptyline (ELAVIL) 50 MG tablet, Take 1 tablet (50 mg total) by mouth  daily., Disp: 30 tablet, Rfl: 11 .  amLODipine (NORVASC) 10 MG tablet, Take 1 tablet (10 mg total) by mouth daily., Disp: 90 tablet, Rfl: 3 .  atorvastatin (LIPITOR) 10 MG tablet, Take 1 tablet (10 mg total) by mouth daily., Disp: 90 tablet, Rfl: 3 .  Blood Glucose Monitoring Suppl (ONE TOUCH ULTRA 2) w/Device KIT, Test once daily Dx E11.9, Disp: 1 each, Rfl: 0 .  cephALEXin (KEFLEX) 500 MG capsule, Take 1 capsule (500 mg total) by mouth 4 (four) times daily., Disp: 40 capsule, Rfl: 0 .  cyclobenzaprine (FLEXERIL) 10 MG tablet, Take 1 tablet (10 mg total) by mouth 3 (three) times daily as needed for muscle spasms., Disp: 60 tablet, Rfl: 0 .  FLUoxetine (PROZAC) 20 MG capsule, TAKE 1 CAPSULE (20 MG) BY ORAL ROUTE ONCE DAILY IN THE EVENING FOR 30 DAYS, Disp: 30 capsule, Rfl: 11 .  furosemide (LASIX) 20 MG tablet, Take 1 tablet (20 mg total) by mouth daily., Disp: 30 tablet, Rfl: 6 .  gabapentin (NEURONTIN) 100 MG capsule, Take 1-3 capsules (100-300 mg total) by mouth 2 (two) times daily., Disp: 180 capsule, Rfl: 3 .  glucose blood (ONE TOUCH ULTRA TEST) test strip, Test sugar daily Dx E11.9, Disp: 100 each, Rfl: 3 .  halobetasol (ULTRAVATE) 0.05 % cream, Apply topically 2 (two) times daily., Disp: 50 g,  Rfl: 5 .  lisinopril (ZESTRIL) 40 MG tablet, Take 1 tablet (40 mg total) by mouth daily., Disp: 90 tablet, Rfl: 1 .  loratadine (CLARITIN) 10 MG tablet, Take 1 tablet by mouth daily., Disp: 90 tablet, Rfl: 1 .  metFORMIN (GLUCOPHAGE) 500 MG tablet, Take 2 tablets (1,000 mg total) by mouth 2 (two) times daily with a meal., Disp: 180 tablet, Rfl: 3 .  montelukast (SINGULAIR) 10 MG tablet, Take 1 tablet by mouth at bedtime., Disp: 90 tablet, Rfl: 0 .  OneTouch Delica Lancets 64W MISC, 1 each by Does not apply route daily. Dx E11.9, Disp: 100 each, Rfl: 3 .  Oxycodone HCl 10 MG TABS, Take 1 tablet (10 mg total) by mouth every 6 (six) hours as needed., Disp: 120 tablet, Rfl: 0 .  Oxycodone HCl 10 MG TABS,  Take 1 tablet (10 mg total) by mouth 4 (four) times daily as needed (pain/headache)., Disp: 120 tablet, Rfl: 0 .  potassium chloride SA (K-DUR,KLOR-CON) 20 MEQ tablet, Take 1 tablet (20 mEq total) by mouth daily., Disp: 90 tablet, Rfl: 3 .  predniSONE (STERAPRED UNI-PAK 21 TAB) 10 MG (21) TBPK tablet, As directed x 6 days, Disp: 21 tablet, Rfl: 0 .  rizatriptan (MAXALT) 10 MG tablet, TAKE 1 TABLET BY MOUTH AS NEEDED FOR MIGRAINE. MAY REPEAT IN 2 HOURS IF NEEDED, Disp: 10 tablet, Rfl: 2 .  sitaGLIPtin (JANUVIA) 50 MG tablet, Take 1 tablet (50 mg total) by mouth daily., Disp: 90 tablet, Rfl: 1 .  topiramate (TOPAMAX) 25 MG tablet, , Disp: , Rfl:   Assessment/ Plan: 43 y.o. female   1. Acute right ankle pain - allopurinol (ZYLOPRIM) 300 MG tablet; Take 1 tablet (300 mg total) by mouth daily.  Dispense: 30 tablet; Refill: 6 - predniSONE (STERAPRED UNI-PAK 21 TAB) 10 MG (21) TBPK tablet; As directed x 6 days  Dispense: 21 tablet; Refill: 0  2. History of gout - allopurinol (ZYLOPRIM) 300 MG tablet; Take 1 tablet (300 mg total) by mouth daily.  Dispense: 30 tablet; Refill: 6 - predniSONE (STERAPRED UNI-PAK 21 TAB) 10 MG (21) TBPK tablet; As directed x 6 days  Dispense: 21 tablet; Refill: 0  3. Acute gout of right ankle, unspecified cause - allopurinol (ZYLOPRIM) 300 MG tablet; Take 1 tablet (300 mg total) by mouth daily.  Dispense: 30 tablet; Refill: 6 - predniSONE (STERAPRED UNI-PAK 21 TAB) 10 MG (21) TBPK tablet; As directed x 6 days  Dispense: 21 tablet; Refill: 0   No follow-ups on file.  Continue all other maintenance medications as listed above.  Start time: 5:20 PM End time: 5:29 PM  Meds ordered this encounter  Medications  . allopurinol (ZYLOPRIM) 300 MG tablet    Sig: Take 1 tablet (300 mg total) by mouth daily.    Dispense:  30 tablet    Refill:  6    Order Specific Question:   Supervising Provider    Answer:   Janora Norlander [[id number]]  . predniSONE (STERAPRED UNI-PAK  21 TAB) 10 MG (21) TBPK tablet    Sig: As directed x 6 days    Dispense:  21 tablet    Refill:  0    Order Specific Question:   Supervising Provider    Answer:   Janora Norlander [[id number]]    Particia Nearing PA-C [place] [phone]

## 2018-09-12 ENCOUNTER — Other Ambulatory Visit: Payer: Self-pay | Admitting: Physician Assistant

## 2018-09-12 DIAGNOSIS — G43809 Other migraine, not intractable, without status migrainosus: Secondary | ICD-10-CM

## 2018-10-01 ENCOUNTER — Other Ambulatory Visit: Payer: Self-pay

## 2018-10-02 ENCOUNTER — Encounter: Payer: Self-pay | Admitting: Physician Assistant

## 2018-10-02 ENCOUNTER — Ambulatory Visit (INDEPENDENT_AMBULATORY_CARE_PROVIDER_SITE_OTHER): Payer: Medicaid - Out of State | Admitting: Physician Assistant

## 2018-10-02 VITALS — BP 135/79 | HR 87 | Temp 97.7°F | Ht 70.0 in | Wt 359.4 lb

## 2018-10-02 DIAGNOSIS — E119 Type 2 diabetes mellitus without complications: Secondary | ICD-10-CM

## 2018-10-02 DIAGNOSIS — G4459 Other complicated headache syndrome: Secondary | ICD-10-CM

## 2018-10-02 DIAGNOSIS — I10 Essential (primary) hypertension: Secondary | ICD-10-CM

## 2018-10-02 DIAGNOSIS — G932 Benign intracranial hypertension: Secondary | ICD-10-CM

## 2018-10-02 LAB — BAYER DCA HB A1C WAIVED: HB A1C (BAYER DCA - WAIVED): 8.9 % — ABNORMAL HIGH (ref <7.0)

## 2018-10-02 MED ORDER — GABAPENTIN 300 MG PO CAPS: 300.0000 mg | ORAL_CAPSULE | Freq: Two times a day (BID) | ORAL | 5 refills | Status: DC

## 2018-10-02 MED ORDER — CEPHALEXIN 500 MG PO CAPS: 500.0000 mg | ORAL_CAPSULE | Freq: Four times a day (QID) | ORAL | 0 refills | Status: DC

## 2018-10-02 NOTE — Patient Instructions (Signed)
In a few days you may receive a survey in the mail or online from [employer] regarding your visit with us today. Please take a moment to fill this out. Your feedback is very important to our whole office. It can help us better understand your needs as well as improve your experience and satisfaction. Thank you for taking your time to complete it. We care about you.  Anshi Jalloh, PA-C  

## 2018-10-02 NOTE — Progress Notes (Signed)
BP 135/79   Pulse 87   Temp 97.7 F (36.5 C) (Temporal)   Ht '5\' 10"'$  (1.778 m)   Wt (!) 359 lb 6.4 oz (163 kg)   BMI 51.57 kg/m    Subjective:    Patient ID: Vanessa [name], female    DOB: October 13, 1975, 43 y.o.   MRN: [id number]  HPI: Vanessa [name] is a 43 y.o. 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  The patient states that she is going to have to get another neurologist because the one that she had gone to did not take her insurance anymore.  She is taking all of her medications as noted.  She does need lab work performed today.  She states that her morning sugars are usually 175 or under.  The goal was to get to 150.  All of her blood pressures have ran 140s over 80s.  She states that overall she is doing well with her chronic headache.  She does not plan on having any other surgeries in the near future.   PAIN ASSESSMENT: Cause of pain- headache  This patient returns for a 3 month recheck on narcotic use for the above named conditions  Current medications-oxycodone 10 mg 1 4 times a day as needed for severe pain Medication side effects-no Any concerns-no   Pain on scale of 1-10- 8 Frequency-3-4 times per week What makes pain Better-rest, dark room Effects on ADL -moderate some days Any change in general medical condition-no  Effectiveness of current meds-good Adverse reactions form pain meds-no PMP AWARE website reviewed: Yes Any suspicious activity on PMP Aware: No MME daily dose: 30. intermittently*  Contract on file 06/29/2018 Last UDS  06/29/2018  History of overdose or risk of abuse no  Past Medical History:  Diagnosis Date  . Hypertension   . Migraines   . Pseudotumor (inflammatory) of orbit   . Vision loss    Relevant past medical, surgical, family and social history reviewed and updated as  indicated. Interim medical history since our last visit reviewed. Allergies and medications reviewed and updated. DATA REVIEWED: CHART IN EPIC  Family History reviewed for pertinent findings.  Review of Systems  Constitutional: Negative.   HENT: Negative.   Eyes: Negative.   Respiratory: Negative.   Gastrointestinal: Negative.   Genitourinary: Negative.   Neurological: Positive for headaches. Negative for tremors, weakness, light-headedness and numbness.    Allergies as of 10/02/2018      Reactions   Metrizamide Other (See Comments), Itching, Photosensitivity, Shortness Of Breath, Swelling   update Migraine instantly   Mushroom Extract Complex Itching   Throat itching with cough   Peanut Oil Anaphylaxis, Other (See Comments)   update   Peanut-containing Drug Products Itching   Itching throat with cough   Diflucan In Dextrose [fluconazole In Dextrose] Hives   Iodine-131 Other (See Comments)   update      Medication List       Accurate as of October 02, 2018 11:16 AM. If you have any questions, ask your nurse or doctor.        STOP taking these medications   predniSONE 10 MG (21) Tbpk tablet Commonly known as: STERAPRED UNI-PAK 21 TAB Stopped by: Terald Sleeper, PA-C     TAKE these medications   acetaZOLAMIDE 250 MG tablet Commonly known as: DIAMOX Take 2 tablets (500 mg total) by mouth  2 (two) times daily.   allopurinol 300 MG tablet Commonly known as: ZYLOPRIM Take 1 tablet (300 mg total) by mouth daily.   amitriptyline 50 MG tablet Commonly known as: ELAVIL Take 1 tablet (50 mg total) by mouth daily.   amLODipine 10 MG tablet Commonly known as: NORVASC Take 1 tablet (10 mg total) by mouth daily.   atorvastatin 10 MG tablet Commonly known as: LIPITOR Take 1 tablet (10 mg total) by mouth daily.   cephALEXin 500 MG capsule Commonly known as: KEFLEX Take 1 capsule (500 mg total) by mouth 4 (four) times daily.   cyclobenzaprine 10 MG tablet Commonly known  as: FLEXERIL Take 1 tablet (10 mg total) by mouth 3 (three) times daily as needed for muscle spasms.   FLUoxetine 20 MG capsule Commonly known as: PROZAC TAKE 1 CAPSULE (20 MG) BY ORAL ROUTE ONCE DAILY IN THE EVENING FOR 30 DAYS   furosemide 20 MG tablet Commonly known as: LASIX Take 1 tablet (20 mg total) by mouth daily.   gabapentin 300 MG capsule Commonly known as: NEURONTIN Take 1 capsule (300 mg total) by mouth 2 (two) times daily. What changed:   medication strength  how much to take Changed by: Terald Sleeper, PA-C   glucose blood test strip Commonly known as: ONE TOUCH ULTRA TEST Test sugar daily Dx E11.9   halobetasol 0.05 % cream Commonly known as: ULTRAVATE Apply topically 2 (two) times daily.   hydrochlorothiazide 25 MG tablet Commonly known as: HYDRODIURIL   lisinopril 40 MG tablet Commonly known as: ZESTRIL Take 1 tablet (40 mg total) by mouth daily.   loratadine 10 MG tablet Commonly known as: CLARITIN Take 1 tablet by mouth daily.   metFORMIN 500 MG tablet Commonly known as: GLUCOPHAGE Take 2 tablets (1,000 mg total) by mouth 2 (two) times daily with a meal.   mirtazapine 15 MG tablet Commonly known as: REMERON   montelukast 10 MG tablet Commonly known as: SINGULAIR Take 1 tablet by mouth at bedtime.   ONE TOUCH ULTRA 2 w/Device Kit Test once daily Dx Q19.7   OneTouch Delica Lancets 58I Misc 1 each by Does not apply route daily. Dx E11.9   Oxycodone HCl 10 MG Tabs Take 1 tablet (10 mg total) by mouth every 6 (six) hours as needed.   Oxycodone HCl 10 MG Tabs Take 1 tablet (10 mg total) by mouth 4 (four) times daily as needed (pain/headache).   potassium chloride SA 20 MEQ tablet Commonly known as: K-DUR Take 1 tablet (20 mEq total) by mouth daily.   rizatriptan 10 MG tablet Commonly known as: MAXALT TAKE 1 TABLET BY MOUTH AS NEEDED FOR MIGRAINE. MAY REPEAT IN 2 HOURS IF NEEDED   sitaGLIPtin 50 MG tablet Commonly known as: Januvia  Take 1 tablet (50 mg total) by mouth daily.   topiramate 25 MG tablet Commonly known as: TOPAMAX          Objective:    BP 135/79   Pulse 87   Temp 97.7 F (36.5 C) (Temporal)   Ht '5\' 10"'$  (1.778 m)   Wt (!) 359 lb 6.4 oz (163 kg)   BMI 51.57 kg/m   Allergies  Allergen Reactions  . Metrizamide Other (See Comments), Itching, Photosensitivity, Shortness Of Breath and Swelling    update Migraine instantly  . Mushroom Extract Complex Itching    Throat itching with cough  . Peanut Oil Anaphylaxis and Other (See Comments)    update  . Peanut-Containing Drug Products  Itching    Itching throat with cough  . Diflucan In Dextrose [Fluconazole In Dextrose] Hives  . Iodine-131 Other (See Comments)    update    Wt Readings from Last 3 Encounters:  10/02/18 (!) 359 lb 6.4 oz (163 kg)  06/29/18 (!) 362 lb 12.8 oz (164.6 kg)  03/31/18 (!) 364 lb 3.2 oz (165.2 kg)    Physical Exam Constitutional:      Appearance: She is well-developed.  HENT:     Head: Normocephalic and atraumatic.  Eyes:     Conjunctiva/sclera: Conjunctivae normal.     Pupils: Pupils are equal, round, and reactive to light.  Cardiovascular:     Rate and Rhythm: Normal rate and regular rhythm.     Heart sounds: Normal heart sounds.  Pulmonary:     Effort: Pulmonary effort is normal.     Breath sounds: Normal breath sounds.  Abdominal:     General: Bowel sounds are normal.     Palpations: Abdomen is soft.  Skin:    General: Skin is warm and dry.     Findings: No rash.  Neurological:     Mental Status: She is alert and oriented to person, place, and time.     Deep Tendon Reflexes: Reflexes are normal and symmetric.  Psychiatric:        Behavior: Behavior normal.        Thought Content: Thought content normal.        Judgment: Judgment normal.     Results for orders placed or performed in visit on 06/29/18  ToxASSURE Select 13 (MW), Urine  Result Value Ref Range   Summary FINAL   CMP14+EGFR   Result Value Ref Range   Glucose 127 (H) 65 - 99 mg/dL   BUN 17 6 - 24 mg/dL   Creatinine, Ser 1.09 (H) 0.57 - 1.00 mg/dL   GFR calc non Af Amer 63 >59 mL/min/1.73   GFR calc Af Amer 72 >59 mL/min/1.73   BUN/Creatinine Ratio 16 9 - 23   Sodium 137 134 - 144 mmol/L   Potassium 3.7 3.5 - 5.2 mmol/L   Chloride 105 96 - 106 mmol/L   CO2 16 (L) 20 - 29 mmol/L   Calcium 9.0 8.7 - 10.2 mg/dL   Total Protein 7.5 6.0 - 8.5 g/dL   Albumin 4.3 3.8 - 4.8 g/dL   Globulin, Total 3.2 1.5 - 4.5 g/dL   Albumin/Globulin Ratio 1.3 1.2 - 2.2   Bilirubin Total 0.3 0.0 - 1.2 mg/dL   Alkaline Phosphatase 47 39 - 117 IU/L   AST 20 0 - 40 IU/L   ALT 21 0 - 32 IU/L  Bayer DCA Hb A1c Waived  Result Value Ref Range   HB A1C (BAYER DCA - WAIVED) 7.4 (H) <7.0 %      Assessment & Plan:   1. Pseudotumor cerebri Handing to get a new neurologist because of insurance  2. Other complicated headache syndrome - gabapentin (NEURONTIN) 300 MG capsule; Take 1 capsule (300 mg total) by mouth 2 (two) times daily.  Dispense: 90 capsule; Refill: 5  3. Essential hypertension Continue medications  4. Diabetes mellitus without complication ([place]) - Bayer DCA Hb A1c Waived   Continue all other maintenance medications as listed above.  Follow up plan: Return in about 2 months (around 12/02/2018).  Educational handout given for [place] PA-C [facility] [address]  [place], [place] [address] [phone]   10/02/2018, 11:16 AM

## 2018-10-05 ENCOUNTER — Other Ambulatory Visit: Payer: Self-pay | Admitting: *Deleted

## 2018-10-05 DIAGNOSIS — I10 Essential (primary) hypertension: Secondary | ICD-10-CM

## 2018-10-05 DIAGNOSIS — E119 Type 2 diabetes mellitus without complications: Secondary | ICD-10-CM

## 2018-11-11 ENCOUNTER — Other Ambulatory Visit: Payer: Self-pay | Admitting: Physician Assistant

## 2018-11-11 DIAGNOSIS — G43809 Other migraine, not intractable, without status migrainosus: Secondary | ICD-10-CM

## 2018-11-11 DIAGNOSIS — G932 Benign intracranial hypertension: Secondary | ICD-10-CM

## 2018-11-13 ENCOUNTER — Other Ambulatory Visit: Payer: Self-pay | Admitting: Physician Assistant

## 2018-11-13 DIAGNOSIS — G43809 Other migraine, not intractable, without status migrainosus: Secondary | ICD-10-CM

## 2018-12-04 ENCOUNTER — Telehealth: Payer: Self-pay | Admitting: Physician Assistant

## 2018-12-07 ENCOUNTER — Other Ambulatory Visit: Payer: Self-pay | Admitting: Physician Assistant

## 2018-12-07 ENCOUNTER — Ambulatory Visit: Payer: Medicaid - Out of State | Admitting: Physician Assistant

## 2018-12-07 NOTE — Telephone Encounter (Signed)
Spoke with the pharmacist at [facility] and the pt doesn't need PA she tried to fill the rx too soon. Her insurance only allows #10 of maxalt per 30 days so he next fill is October 17th. Pharmacist states she would call pt.

## 2018-12-08 ENCOUNTER — Ambulatory Visit (INDEPENDENT_AMBULATORY_CARE_PROVIDER_SITE_OTHER): Payer: Self-pay | Admitting: Physician Assistant

## 2018-12-08 ENCOUNTER — Other Ambulatory Visit: Payer: Self-pay

## 2018-12-08 ENCOUNTER — Encounter: Payer: Self-pay | Admitting: Physician Assistant

## 2018-12-08 VITALS — BP 114/77 | HR 98 | Temp 97.3°F | Ht 70.0 in | Wt 344.0 lb

## 2018-12-08 DIAGNOSIS — G932 Benign intracranial hypertension: Secondary | ICD-10-CM

## 2018-12-08 DIAGNOSIS — G4459 Other complicated headache syndrome: Secondary | ICD-10-CM

## 2018-12-08 DIAGNOSIS — I890 Lymphedema, not elsewhere classified: Secondary | ICD-10-CM

## 2018-12-08 DIAGNOSIS — L03115 Cellulitis of right lower limb: Secondary | ICD-10-CM

## 2018-12-08 MED ORDER — OXYCODONE HCL 10 MG PO TABS: 10.0000 mg | ORAL_TABLET | Freq: Four times a day (QID) | ORAL | 0 refills | Status: DC | PRN

## 2018-12-08 MED ORDER — DOXYCYCLINE HYCLATE 100 MG PO TABS: 100.0000 mg | ORAL_TABLET | Freq: Every day | ORAL | 3 refills | Status: DC

## 2018-12-08 NOTE — Patient Instructions (Signed)

## 2018-12-09 ENCOUNTER — Ambulatory Visit: Payer: Medicaid - Out of State | Admitting: Physician Assistant

## 2018-12-13 NOTE — Progress Notes (Signed)
BP 114/77   Pulse 98   Temp (!) 97.3 F (36.3 C) (Temporal)   Ht '5\' 10"'$  (1.778 m)   Wt (!) 344 lb (156 kg)   LMP 11/24/2018   SpO2 99%   BMI 49.36 kg/m    Subjective:    Patient ID: Vanessa [name], female    DOB: 1975-06-16, 43 y.o.   MRN: [id number]  HPI: Vanessa [name] is a 43 y.o. female presenting on 12/08/2018 for Hospitalization Follow-up  Patient is here for follow-up on her hospitalization for cellulitis that turned into sepsis.  She had mild kidney involvement but overall is feeling much better.  When she was first in the hospital her elevation of white blood cell count was there but it did improve if she was there.  Her kidney functions also improved.  She was in the hospital for about 11 days.  We have discussed her needing to follow-up with wound care.  And she is going to find a clinic in [place] that we can refer her to.  PAIN ASSESSMENT: Cause of pain-headache  This patient returns for a 3 month recheck on narcotic use for the above named conditions  Current medications-oxycodone 10 mg 1 4 times a day as needed for severe pain Medication side effects-no Any concerns-no  Pain on scale of 1-10-8 Frequency-3-4 times per week What makes pain Better-rest, dark room Effects on ADL -moderate some days Any change in general medical condition-no  Effectiveness of current meds-good Adverse reactions form pain meds-no PMP AWARE website reviewed:Yes Any suspicious activity on PMP Aware:No MME daily dose:30. intermittently*  Contract on file5/05/2018 Last UDS5/05/2018  Past Medical History:  Diagnosis Date  . Hypertension   . Migraines   . Pseudotumor (inflammatory) of orbit   . Vision loss    Relevant past medical, surgical, family and social history reviewed and updated as indicated. Interim medical history since our last visit reviewed. Allergies and medications reviewed and updated. DATA REVIEWED: CHART IN EPIC  Family History reviewed for  pertinent findings.  Review of Systems  Constitutional: Negative.  Negative for activity change, fatigue, fever and unexpected weight change.  HENT: Negative.   Eyes: Negative.   Respiratory: Negative.  Negative for cough.   Cardiovascular: Positive for leg swelling. Negative for chest pain and palpitations.  Gastrointestinal: Negative.  Negative for abdominal pain.  Endocrine: Negative.   Genitourinary: Negative.  Negative for dysuria.  Musculoskeletal: Positive for arthralgias and joint swelling.  Skin: Negative.   Neurological: Positive for headaches.    Allergies as of 12/08/2018      Reactions   Metrizamide Other (See Comments), Itching, Photosensitivity, Shortness Of Breath, Swelling   update Migraine instantly   Mushroom Extract Complex Itching   Throat itching with cough   Peanut Oil Anaphylaxis, Other (See Comments)   update   Peanut-containing Drug Products Itching   Itching throat with cough   Diflucan In Dextrose [fluconazole In Dextrose] Hives   Iodine-131 Other (See Comments)   update      Medication List       Accurate as of December 08, 2018 11:59 PM. If you have any questions, ask your nurse or doctor.        STOP taking these medications   amLODipine 10 MG tablet Commonly known as: NORVASC Stopped by: Terald Sleeper, PA-C   cephALEXin 500 MG capsule Commonly known as: KEFLEX Stopped by: Terald Sleeper, PA-C   hydrochlorothiazide 25 MG tablet Commonly known as: HYDRODIURIL Stopped by: [employer]  Adah Salvage, PA-C     TAKE these medications   acetaZOLAMIDE 250 MG tablet Commonly known as: DIAMOX Take 2 tablets (500 mg total) by mouth 2 (two) times daily.   allopurinol 300 MG tablet Commonly known as: ZYLOPRIM Take 1 tablet (300 mg total) by mouth daily.   amitriptyline 50 MG tablet Commonly known as: ELAVIL Take 1 tablet (50 mg total) by mouth daily.   amoxicillin-clavulanate 875-125 MG tablet Commonly known as: AUGMENTIN Take 1 tablet by mouth  2 (two) times daily.   atorvastatin 10 MG tablet Commonly known as: LIPITOR Take 1 tablet (10 mg total) by mouth daily.   cyclobenzaprine 10 MG tablet Commonly known as: FLEXERIL Take 1 tablet (10 mg total) by mouth 3 (three) times daily as needed for muscle spasms.   doxycycline 100 MG tablet Commonly known as: ADOXA Take 100 mg by mouth 2 (two) times daily.   doxycycline 100 MG tablet Commonly known as: VIBRA-TABS Take 1 tablet (100 mg total) by mouth daily. Started by: Terald Sleeper, PA-C   FLUoxetine 20 MG capsule Commonly known as: PROZAC TAKE 1 CAPSULE (20 MG) BY ORAL ROUTE ONCE DAILY IN THE EVENING FOR 30 DAYS   furosemide 20 MG tablet Commonly known as: LASIX Take 1 tablet (20 mg total) by mouth daily.   gabapentin 300 MG capsule Commonly known as: NEURONTIN Take 1 capsule (300 mg total) by mouth 2 (two) times daily.   glucose blood test strip Commonly known as: ONE TOUCH ULTRA TEST Test sugar daily Dx E11.9   halobetasol 0.05 % cream Commonly known as: ULTRAVATE Apply topically 2 (two) times daily.   lisinopril 40 MG tablet Commonly known as: ZESTRIL Take 1 tablet (40 mg total) by mouth daily.   loratadine 10 MG tablet Commonly known as: CLARITIN Take 1 tablet by mouth daily.   metFORMIN 500 MG tablet Commonly known as: GLUCOPHAGE Take 2 tablets (1,000 mg total) by mouth 2 (two) times daily with a meal.   mirtazapine 15 MG tablet Commonly known as: REMERON   montelukast 10 MG tablet Commonly known as: SINGULAIR Take 1 tablet by mouth at bedtime.   ONE TOUCH ULTRA 2 w/Device Kit Test once daily Dx Z30.8   OneTouch Delica Lancets 65H Misc 1 each by Does not apply route daily. Dx E11.9   Oxycodone HCl 10 MG Tabs Take 1 tablet (10 mg total) by mouth every 6 (six) hours as needed.   Oxycodone HCl 10 MG Tabs Take 1 tablet (10 mg total) by mouth 4 (four) times daily as needed (pain/headache).   potassium chloride SA 20 MEQ tablet Commonly known  as: KLOR-CON Take 1 tablet (20 mEq total) by mouth daily.   rizatriptan 10 MG tablet Commonly known as: MAXALT Take 1 tablet by mouth as needed for migraine, and may repeat in 2 hours if needed.   sitaGLIPtin 50 MG tablet Commonly known as: Januvia Take 1 tablet (50 mg total) by mouth daily.   topiramate 25 MG tablet Commonly known as: TOPAMAX          Objective:    BP 114/77   Pulse 98   Temp (!) 97.3 F (36.3 C) (Temporal)   Ht '5\' 10"'$  (1.778 m)   Wt (!) 344 lb (156 kg)   LMP 11/24/2018   SpO2 99%   BMI 49.36 kg/m   Allergies  Allergen Reactions  . Metrizamide Other (See Comments), Itching, Photosensitivity, Shortness Of Breath and Swelling    update Migraine instantly  .  Mushroom Extract Complex Itching    Throat itching with cough  . Peanut Oil Anaphylaxis and Other (See Comments)    update  . Peanut-Containing Drug Products Itching    Itching throat with cough  . Diflucan In Dextrose [Fluconazole In Dextrose] Hives  . Iodine-131 Other (See Comments)    update    Wt Readings from Last 3 Encounters:  12/08/18 (!) 344 lb (156 kg)  10/02/18 (!) 359 lb 6.4 oz (163 kg)  06/29/18 (!) 362 lb 12.8 oz (164.6 kg)    Physical Exam Constitutional:      Appearance: She is well-developed.  HENT:     Head: Normocephalic and atraumatic.     Right Ear: Tympanic membrane, ear canal and external ear normal.     Left Ear: Tympanic membrane, ear canal and external ear normal.     Nose: Nose normal. No rhinorrhea.     Mouth/Throat:     Pharynx: No oropharyngeal exudate or posterior oropharyngeal erythema.  Eyes:     Conjunctiva/sclera: Conjunctivae normal.     Pupils: Pupils are equal, round, and reactive to light.  Neck:     Musculoskeletal: Normal range of motion and neck supple.  Cardiovascular:     Rate and Rhythm: Normal rate and regular rhythm.     Heart sounds: Normal heart sounds.  Pulmonary:     Effort: Pulmonary effort is normal.     Breath sounds: Normal  breath sounds.  Abdominal:     General: Bowel sounds are normal.     Palpations: Abdomen is soft.  Skin:    General: Skin is warm and dry.     Findings: No rash.  Neurological:     Mental Status: She is alert and oriented to person, place, and time.     Deep Tendon Reflexes: Reflexes are normal and symmetric.  Psychiatric:        Behavior: Behavior normal.        Thought Content: Thought content normal.        Judgment: Judgment normal.     Results for orders placed or performed in visit on 10/02/18  Bayer DCA Hb A1c Waived  Result Value Ref Range   HB A1C (BAYER DCA - WAIVED) 8.9 (H) <7.0 %      Assessment & Plan:   1. Lymphedema of both lower extremities Continue diuretics Continue compression stockings   2. Cellulitis of right lower extremity - amoxicillin-clavulanate (AUGMENTIN) 875-125 MG tablet; Take 1 tablet by mouth 2 (two) times daily. - doxycycline (ADOXA) 100 MG tablet; Take 100 mg by mouth 2 (two) times daily. - doxycycline (VIBRA-TABS) 100 MG tablet; Take 1 tablet (100 mg total) by mouth daily.  Dispense: 30 tablet; Refill: 3  3. Pseudotumor cerebri - Oxycodone HCl 10 MG TABS; Take 1 tablet (10 mg total) by mouth every 6 (six) hours as needed.  Dispense: 120 tablet; Refill: 0  4. Other complicated headache syndrome - Oxycodone HCl 10 MG TABS; Take 1 tablet (10 mg total) by mouth every 6 (six) hours as needed.  Dispense: 120 tablet; Refill: 0 - Oxycodone HCl 10 MG TABS; Take 1 tablet (10 mg total) by mouth 4 (four) times daily as needed (pain/headache).  Dispense: 120 tablet; Refill: 0   Continue all other maintenance medications as listed above.  Follow up plan: Check in 1 month  Educational handout given for [place] PA-C [place] [facility] [address]  [place], [place] [address] [phone]  12/13/2018, 10:08 PM

## 2019-01-01 ENCOUNTER — Ambulatory Visit: Payer: Medicaid - Out of State | Admitting: Physician Assistant

## 2019-01-04 ENCOUNTER — Other Ambulatory Visit: Payer: Self-pay

## 2019-01-05 ENCOUNTER — Other Ambulatory Visit: Payer: Self-pay | Admitting: Physician Assistant

## 2019-01-05 ENCOUNTER — Encounter: Payer: Self-pay | Admitting: Physician Assistant

## 2019-01-05 ENCOUNTER — Ambulatory Visit (INDEPENDENT_AMBULATORY_CARE_PROVIDER_SITE_OTHER): Payer: Self-pay | Admitting: Physician Assistant

## 2019-01-05 VITALS — BP 130/84 | HR 84 | Temp 97.3°F | Ht 70.0 in | Wt 338.4 lb

## 2019-01-05 DIAGNOSIS — L03115 Cellulitis of right lower limb: Secondary | ICD-10-CM

## 2019-01-05 DIAGNOSIS — I10 Essential (primary) hypertension: Secondary | ICD-10-CM

## 2019-01-05 DIAGNOSIS — G43809 Other migraine, not intractable, without status migrainosus: Secondary | ICD-10-CM

## 2019-01-05 DIAGNOSIS — I890 Lymphedema, not elsewhere classified: Secondary | ICD-10-CM

## 2019-01-05 MED ORDER — OZEMPIC (0.25 OR 0.5 MG/DOSE) 2 MG/1.5ML ~~LOC~~ SOPN: 0.5000 mg | PEN_INJECTOR | SUBCUTANEOUS | 3 refills | Status: DC

## 2019-01-05 MED ORDER — NARATRIPTAN HCL 2.5 MG PO TABS: 2.5000 mg | ORAL_TABLET | ORAL | 5 refills | Status: DC | PRN

## 2019-01-05 MED ORDER — MONTELUKAST SODIUM 10 MG PO TABS: 10.0000 mg | ORAL_TABLET | Freq: Every day | ORAL | 3 refills | Status: DC

## 2019-01-05 MED ORDER — LISINOPRIL 40 MG PO TABS: 40.0000 mg | ORAL_TABLET | Freq: Every day | ORAL | 1 refills | Status: DC

## 2019-01-05 MED ORDER — SITAGLIPTIN PHOSPHATE 50 MG PO TABS: 50.0000 mg | ORAL_TABLET | Freq: Every day | ORAL | 1 refills | Status: DC

## 2019-01-05 MED ORDER — LORATADINE 10 MG PO TABS: 10.0000 mg | ORAL_TABLET | Freq: Every day | ORAL | 1 refills | Status: DC

## 2019-01-05 MED ORDER — FUROSEMIDE 20 MG PO TABS: 20.0000 mg | ORAL_TABLET | Freq: Every day | ORAL | 5 refills | Status: DC

## 2019-01-05 NOTE — Progress Notes (Signed)
Down 25 #     BP 130/84   Pulse 84   Temp (!) 97.3 F (36.3 C) (Temporal)   Ht _0  (1.778 m)   Wt (!) 338 lb 6.4 oz (153.5 kg)   LMP 01/05/2019   SpO2 100%   BMI 48.56 kg/m    Subjective:    Patient ID: Vanessa [name], female    DOB: Jan 30, 1976, 43 y.o.   MRN: [id number]  HPI: Vanessa [name] is a 43 y.o. female presenting on 01/05/2019 for Blood Pressure Check  Patient comes back for blood pressure check and recheck on her cellulitis that she had in her lower legs where she has lymphedema.  She was admitted for this.  She is doing much better.  She is trying to wear compressions.  She did not wear today to the office there is still a slight firmness in her right lower leg but it is much better.  The blood pressure is also much better.  The patient is under the care of endocrinology.  And she does need to have refills on some of her medications.  We will try to send these in.  I have warned her that they may not go through them and she will need to get back in touch with her endocrinologist  Patient does have migraines that are chronic.  In the past she has tried Maxalt and Imitrex.  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  We will try this.  She is still on Topamax.  The patient is down 25 pounds since the spring.  It is the Ozempic that she started she feels that is helping her the most.  She has had a great decrease in appetite and food intake.  She also reports that her sugars have been much better.  Past Medical History:  Diagnosis Date  . Hypertension   . Migraines   . Pseudotumor (inflammatory) of orbit   . Vision loss    Relevant past medical, surgical, family and social history reviewed and updated as indicated. Interim medical history since our last visit reviewed. Allergies and medications reviewed and updated. DATA REVIEWED: CHART IN EPIC  Family History reviewed for pertinent findings.  Review of Systems  Constitutional: Negative.  Negative for activity change, fatigue and  fever.  HENT: Negative.   Eyes: Negative.   Respiratory: Negative.  Negative for cough.   Cardiovascular: Positive for leg swelling. Negative for chest pain and palpitations.  Gastrointestinal: Negative.  Negative for abdominal pain.  Endocrine: Negative.   Genitourinary: Negative.  Negative for dysuria.  Musculoskeletal: Negative.   Skin: Negative.   Neurological: Positive for headaches.    Allergies as of 01/05/2019      Reactions   Metrizamide Other (See Comments), Itching, Photosensitivity, Shortness Of Breath, Swelling   update Migraine instantly   Mushroom Extract Complex Itching   Throat itching with cough   Peanut Oil Anaphylaxis, Other (See Comments)   update   Peanut-containing Drug Products Itching   Itching throat with cough   Diflucan In Dextrose [fluconazole In Dextrose] Hives   Iodine-131 Other (See Comments)   update      Medication List       Accurate as of January 05, 2019 11:59 PM. If you have any questions, ask your nurse or doctor.        STOP taking these medications   doxycycline 100 MG tablet Commonly known as: ADOXA Stopped by: Terald Sleeper, PA-C   rizatriptan 10 MG tablet Commonly known as: MAXALT  Stopped by: Terald Sleeper, PA-C     TAKE these medications   acetaZOLAMIDE 250 MG tablet Commonly known as: DIAMOX Take 2 tablets (500 mg total) by mouth 2 (two) times daily.   allopurinol 300 MG tablet Commonly known as: ZYLOPRIM Take 1 tablet (300 mg total) by mouth daily.   amitriptyline 50 MG tablet Commonly known as: ELAVIL Take 1 tablet (50 mg total) by mouth daily.   amoxicillin-clavulanate 875-125 MG tablet Commonly known as: AUGMENTIN Take 1 tablet by mouth 2 (two) times daily.   atorvastatin 10 MG tablet Commonly known as: LIPITOR Take 1 tablet (10 mg total) by mouth daily.   cyclobenzaprine 10 MG tablet Commonly known as: FLEXERIL Take 1 tablet (10 mg total) by mouth 3 (three) times daily as needed for muscle spasms.    doxycycline 100 MG tablet Commonly known as: VIBRA-TABS Take 1 tablet (100 mg total) by mouth daily.   FLUoxetine 20 MG capsule Commonly known as: PROZAC TAKE 1 CAPSULE (20 MG) BY ORAL ROUTE ONCE DAILY IN THE EVENING FOR 30 DAYS   furosemide 20 MG tablet Commonly known as: LASIX Take 1 tablet (20 mg total) by mouth daily.   gabapentin 300 MG capsule Commonly known as: NEURONTIN Take 1 capsule (300 mg total) by mouth 2 (two) times daily.   glucose blood test strip Commonly known as: ONE TOUCH ULTRA TEST Test sugar daily Dx E11.9   halobetasol 0.05 % cream Commonly known as: ULTRAVATE Apply topically 2 (two) times daily.   lisinopril 40 MG tablet Commonly known as: ZESTRIL Take 1 tablet (40 mg total) by mouth daily.   loratadine 10 MG tablet Commonly known as: CLARITIN Take 1 tablet (10 mg total) by mouth daily.   metFORMIN 500 MG tablet Commonly known as: GLUCOPHAGE Take 2 tablets (1,000 mg total) by mouth 2 (two) times daily with a meal.   mirtazapine 15 MG tablet Commonly known as: REMERON   montelukast 10 MG tablet Commonly known as: SINGULAIR Take 1 tablet (10 mg total) by mouth at bedtime.   naratriptan 2.5 MG tablet Commonly known as: AMERGE Take 1 tablet (2.5 mg total) by mouth as needed for migraine. Take one (1) tablet at onset of headache; if returns or does not resolve, may repeat after 4 hours; do not exceed five (5) mg in 24 hours. Started by: Terald Sleeper, PA-C   ONE TOUCH ULTRA 2 w/Device Kit Test once daily Dx Q73.4   OneTouch Delica Lancets 19F Misc 1 each by Does not apply route daily. Dx E11.9   Oxycodone HCl 10 MG Tabs Take 1 tablet (10 mg total) by mouth every 6 (six) hours as needed.   Oxycodone HCl 10 MG Tabs Take 1 tablet (10 mg total) by mouth 4 (four) times daily as needed (pain/headache).   Ozempic (0.25 or 0.5 MG/DOSE) 2 MG/1.5ML Sopn Generic drug: Semaglutide(0.25 or 0.5MG/DOS) Inject 0.5 mg into the skin once a week.  Started by: Terald Sleeper, PA-C   potassium chloride SA 20 MEQ tablet Commonly known as: KLOR-CON Take 1 tablet (20 mEq total) by mouth daily.   sitaGLIPtin 50 MG tablet Commonly known as: Januvia Take 1 tablet (50 mg total) by mouth daily.   topiramate 25 MG tablet Commonly known as: TOPAMAX          Objective:    BP 130/84   Pulse 84   Temp (!) 97.3 F (36.3 C) (Temporal)   Ht _0  (1.778 m)   Wt Marland Kitchen)  338 lb 6.4 oz (153.5 kg)   LMP 01/05/2019   SpO2 100%   BMI 48.56 kg/m   Allergies  Allergen Reactions  . Metrizamide Other (See Comments), Itching, Photosensitivity, Shortness Of Breath and Swelling    update Migraine instantly  . Mushroom Extract Complex Itching    Throat itching with cough  . Peanut Oil Anaphylaxis and Other (See Comments)    update  . Peanut-Containing Drug Products Itching    Itching throat with cough  . Diflucan In Dextrose [Fluconazole In Dextrose] Hives  . Iodine-131 Other (See Comments)    update    Wt Readings from Last 3 Encounters:  01/05/19 (!) 338 lb 6.4 oz (153.5 kg)  12/08/18 (!) 344 lb (156 kg)  10/02/18 (!) 359 lb 6.4 oz (163 kg)    Physical Exam Constitutional:      General: She is not in acute distress.    Appearance: Normal appearance. She is well-developed.  HENT:     Head: Normocephalic and atraumatic.  Cardiovascular:     Rate and Rhythm: Normal rate.  Pulmonary:     Effort: Pulmonary effort is normal.  Skin:    General: Skin is warm and dry.     Findings: No rash.  Neurological:     Mental Status: She is alert and oriented to person, place, and time.     Deep Tendon Reflexes: Reflexes are normal and symmetric.     Results for orders placed or performed in visit on 10/02/18  Bayer DCA Hb A1c Waived  Result Value Ref Range   HB A1C (BAYER DCA - WAIVED) 8.9 (H) <7.0 %      Assessment & Plan:   1. Essential hypertension - lisinopril (ZESTRIL) 40 MG tablet; Take 1 tablet (40 mg total) by mouth daily.   Dispense: 90 tablet; Refill: 1  2. Lymphedema of both lower extremities - furosemide (LASIX) 20 MG tablet; Take 1 tablet (20 mg total) by mouth daily.  Dispense: 30 tablet; Refill: 5  3. Cellulitis of right lower extremity  4. Other migraine without status migrainosus, not intractable - naratriptan (AMERGE) 2.5 MG tablet; Take 1 tablet (2.5 mg total) by mouth as needed for migraine. Take one (1) tablet at onset of headache; if returns or does not resolve, may repeat after 4 hours; do not exceed five (5) mg in 24 hours.  Dispense: 10 tablet; Refill: 5   Continue all other maintenance medications as listed above.  Follow up plan: Return in about 3 months (around 04/07/2019).  Educational handout given for [place] PA-C [place] [address]  [place], [place] [address] [phone]   01/07/2019, 3:02 PM

## 2019-01-06 NOTE — Telephone Encounter (Signed)
Pharmacy comment: Alternative Requested: NOT ON FORMULARY (NOT COVERED)

## 2019-01-06 NOTE — Telephone Encounter (Signed)
Let patient know, she will need to get in touch with her endocrinologist.  They are the prescriber, I was just sending until she got there.

## 2019-01-07 ENCOUNTER — Other Ambulatory Visit: Payer: Self-pay | Admitting: Physician Assistant

## 2019-01-07 NOTE — Telephone Encounter (Signed)
Let patient know, she will need to get in touch with her endocrinologist.  They are the prescriber, I was just sending until she got there.

## 2019-01-07 NOTE — Telephone Encounter (Signed)
Attempted to contact patient - NA °

## 2019-01-13 ENCOUNTER — Other Ambulatory Visit: Payer: Self-pay | Admitting: Physician Assistant

## 2019-01-13 DIAGNOSIS — G43809 Other migraine, not intractable, without status migrainosus: Secondary | ICD-10-CM

## 2019-02-05 ENCOUNTER — Other Ambulatory Visit: Payer: Self-pay | Admitting: Physician Assistant

## 2019-02-05 DIAGNOSIS — I890 Lymphedema, not elsewhere classified: Secondary | ICD-10-CM

## 2019-02-05 DIAGNOSIS — E282 Polycystic ovarian syndrome: Secondary | ICD-10-CM

## 2019-02-17 ENCOUNTER — Other Ambulatory Visit: Payer: Self-pay | Admitting: Physician Assistant

## 2019-02-17 DIAGNOSIS — I10 Essential (primary) hypertension: Secondary | ICD-10-CM

## 2019-02-17 NOTE — Telephone Encounter (Signed)
OV 04/07/19

## 2019-02-26 DIAGNOSIS — A419 Sepsis, unspecified organism: Secondary | ICD-10-CM

## 2019-02-26 HISTORY — DX: Sepsis, unspecified organism: A41.9

## 2019-03-06 ENCOUNTER — Other Ambulatory Visit: Payer: Self-pay | Admitting: Physician Assistant

## 2019-03-06 DIAGNOSIS — I10 Essential (primary) hypertension: Secondary | ICD-10-CM

## 2019-03-24 ENCOUNTER — Other Ambulatory Visit: Payer: Self-pay | Admitting: Physician Assistant

## 2019-03-24 ENCOUNTER — Ambulatory Visit (INDEPENDENT_AMBULATORY_CARE_PROVIDER_SITE_OTHER): Payer: Medicaid - Out of State | Admitting: Physician Assistant

## 2019-03-24 ENCOUNTER — Encounter: Payer: Self-pay | Admitting: Physician Assistant

## 2019-03-24 DIAGNOSIS — L03115 Cellulitis of right lower limb: Secondary | ICD-10-CM

## 2019-03-24 DIAGNOSIS — Z8619 Personal history of other infectious and parasitic diseases: Secondary | ICD-10-CM

## 2019-03-24 DIAGNOSIS — I890 Lymphedema, not elsewhere classified: Secondary | ICD-10-CM

## 2019-03-24 MED ORDER — DOXYCYCLINE MONOHYDRATE 100 MG PO TABS: 100.0000 mg | ORAL_TABLET | Freq: Two times a day (BID) | ORAL | 5 refills | Status: DC

## 2019-03-24 MED ORDER — DOXYCYCLINE HYCLATE 100 MG PO TABS: 100.0000 mg | ORAL_TABLET | Freq: Two times a day (BID) | ORAL | 5 refills | Status: DC

## 2019-03-24 NOTE — Progress Notes (Signed)
Telephone visit  Subjective: CC: Recheck from hospitalization for sepsis and cellulitis PCP: Vanessa Sleeper, PA-C UMP:NTIRW [name] is a 44 y.o. female calls for telephone consult today. Patient provides verbal consent for consult held via phone.  Patient is identified with 2 separate identifiers.  At this time the entire area is on COVID-19 social distancing and stay home orders are in place.  Patient is of higher risk and therefore we are performing this by a virtual method.  Location of patient: Home Location of provider: HOME Others present for call: N no     This patient is having a visit today that is actually a hospital follow-up from her admission to [facility] in [place]. About 2 weeks ago she began with redness in her lower leg.  She states that she did not have significant swelling at that time and then the infection but rather the infection and then more swelling.  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  She has never been to vascular for evaluation of this.  I do think this would be appropriate at this time.  She has had 2 admissions for cellulitis related to this.  This time "she quickly felt shivers and felt ill and went to the hospital.  She was admitted for cellulitis and sepsis.  She was started on vancomycin.  In about 4 days everything improved and her labs made improvement.  They kept her 1 more day and everything continue to be better.  She was sent out on doxycycline 100 mg 1 tablet twice daily.  She is still finishing that medication.  At this time I had like her to continue that medication at that that dose rather than just 1 daily.  And plan for referral to specialist.  She denies any fever or chills.  And she states that overall she is feeling much better.  The notes from that hospitalization have been sent to our office and I will review them.    ROS: Per HPI  Allergies  Allergen Reactions  .  Metrizamide Other (See Comments), Itching, Photosensitivity, Shortness Of Breath and Swelling    update Migraine instantly  . Mushroom Extract Complex Itching    Throat itching with cough  . Peanut Oil Anaphylaxis and Other (See Comments)    update  . Peanut-Containing Drug Products Itching    Itching throat with cough  . Diflucan In Dextrose [Fluconazole In Dextrose] Hives  . Iodine-131 Other (See Comments)    update   Past Medical History:  Diagnosis Date  . Hypertension   . Migraines   . Pseudotumor (inflammatory) of orbit   . Vision loss     Current Outpatient Medications:  .  acetaZOLAMIDE (DIAMOX) 250 MG tablet, Take 2 tablets (500 mg total) by mouth 2 (two) times daily., Disp: 120 tablet, Rfl: 10 .  allopurinol (ZYLOPRIM) 300 MG tablet, Take 1 tablet (300 mg total) by mouth daily., Disp: 30 tablet, Rfl: 6 .  amitriptyline (ELAVIL) 50 MG tablet, Take 1 tablet (50 mg total) by mouth daily., Disp: 30 tablet, Rfl: 10 .  atorvastatin (LIPITOR) 10 MG tablet, Take 1 tablet (10 mg total) by mouth daily., Disp: 90 tablet, Rfl: 2 .  Blood Glucose Monitoring Suppl (ONE TOUCH ULTRA 2) w/Device KIT, Test once daily Dx E11.9, Disp: 1 each, Rfl: 0 .  cyclobenzaprine (FLEXERIL) 10 MG tablet, Take 1 tablet (10 mg total) by mouth 3 (three) times daily as needed for  muscle spasms., Disp: 60 tablet, Rfl: 0 .  doxycycline (VIBRA-TABS) 100 MG tablet, Take 1 tablet (100 mg total) by mouth 2 (two) times daily., Disp: 60 tablet, Rfl: 5 .  FLUoxetine (PROZAC) 20 MG capsule, Take 1 capsule by mouth every evening., Disp: 30 capsule, Rfl: 0 .  furosemide (LASIX) 20 MG tablet, Take 1 tablet by mouth daily., Disp: 30 tablet, Rfl: 5 .  gabapentin (NEURONTIN) 300 MG capsule, Take 1 capsule (300 mg total) by mouth 2 (two) times daily., Disp: 90 capsule, Rfl: 5 .  glucose blood (ONE TOUCH ULTRA TEST) test strip, Test sugar daily Dx E11.9, Disp: 100 each, Rfl: 3 .  halobetasol (ULTRAVATE) 0.05 % cream, Apply  topically 2 (two) times daily., Disp: 50 g, Rfl: 5 .  JANUVIA 50 MG tablet, Take 1 tablet by mouth daily., Disp: 90 tablet, Rfl: 0 .  lisinopril (ZESTRIL) 40 MG tablet, Take 1 tablet by mouth daily., Disp: 90 tablet, Rfl: 0 .  loratadine (CLARITIN) 10 MG tablet, Take 1 tablet (10 mg total) by mouth daily., Disp: 90 tablet, Rfl: 1 .  metFORMIN (GLUCOPHAGE) 500 MG tablet, Take 2 tablets by mouth twice daily with a meal., Disp: 180 tablet, Rfl: 2 .  mirtazapine (REMERON) 15 MG tablet, , Disp: , Rfl:  .  montelukast (SINGULAIR) 10 MG tablet, Take 1 tablet by mouth at bedtime., Disp: 90 tablet, Rfl: 0 .  naratriptan (AMERGE) 2.5 MG tablet, Take 1 tablet (2.5 mg total) by mouth as needed for migraine. Take one (1) tablet at onset of headache; if returns or does not resolve, may repeat after 4 hours; do not exceed five (5) mg in 24 hours., Disp: 10 tablet, Rfl: 5 .  OneTouch Delica Lancets 16X MISC, 1 each by Does not apply route daily. Dx E11.9, Disp: 100 each, Rfl: 3 .  Oxycodone HCl 10 MG TABS, Take 1 tablet (10 mg total) by mouth every 6 (six) hours as needed., Disp: 120 tablet, Rfl: 0 .  Oxycodone HCl 10 MG TABS, Take 1 tablet (10 mg total) by mouth 4 (four) times daily as needed (pain/headache)., Disp: 120 tablet, Rfl: 0 .  potassium chloride SA (KLOR-CON) 20 MEQ tablet, Take 1 tablet by mouth daily., Disp: 90 tablet, Rfl: 0 .  Semaglutide,0.25 or 0.5MG/DOS, (OZEMPIC, 0.25 OR 0.5 MG/DOSE,) 2 MG/1.5ML SOPN, Inject 0.5 mg into the skin once a week., Disp: 4 pen, Rfl: 3 .  topiramate (TOPAMAX) 25 MG tablet, , Disp: , Rfl:   Assessment/ Plan: 44 y.o. female   1. Cellulitis of right lower extremity - doxycycline (VIBRA-TABS) 100 MG tablet; Take 1 tablet (100 mg total) by mouth 2 (two) times daily.  Dispense: 60 tablet; Refill: 5 - Ambulatory referral to Vascular Surgery  2. History of sepsis - Ambulatory referral to Vascular Surgery  3. Lymphedema of both lower extremities - Ambulatory referral  to Vascular Surgery   No follow-ups on file.  Continue all other maintenance medications as listed above.  Start time: 8:56 AM End time: 9:12 AM  Meds ordered this encounter  Medications  . doxycycline (VIBRA-TABS) 100 MG tablet    Sig: Take 1 tablet (100 mg total) by mouth 2 (two) times daily.    Dispense:  60 tablet    Refill:  5    Order Specific Question:   Supervising Provider    Answer:   Janora Norlander [[id number]]    Particia Nearing PA-C [place] [phone]

## 2019-03-24 NOTE — Telephone Encounter (Signed)
Pharmacy comment: Alternative Requested:INSURANCE WILL PAY FOR DOXYCYC MONO CAPS 100 MG, COULD YOU CHANGE TO THAT? OR AUTHORIZE HYCLATE WITH INSURANCE PLEASE? THANK YOU!  All Pharmacy Suggested Alternatives:   0 doxycycline (AVIDOXY) 100 MG tablet 0 doxycycline (MONDOXYNE NL) 100 MG capsule 0 doxycycline (MONODOX) 100 MG capsule  To prescribe one of the alternatives listed above, open the encounter and click Replace

## 2019-04-05 ENCOUNTER — Other Ambulatory Visit: Payer: Self-pay | Admitting: Physician Assistant

## 2019-04-05 ENCOUNTER — Telehealth: Payer: Self-pay | Admitting: *Deleted

## 2019-04-05 MED ORDER — DOXYCYCLINE HYCLATE 100 MG PO TABS: 100.0000 mg | ORAL_TABLET | Freq: Two times a day (BID) | ORAL | 5 refills | Status: DC

## 2019-04-05 NOTE — Telephone Encounter (Signed)
Prescription for doxycycline hyclate sent to the pharmacy

## 2019-04-05 NOTE — Telephone Encounter (Signed)
Doxycycline Monohydrate tabs will no longer be covered by patients insurance starting 04/26/19.  Formulary alternatives are docycycline hyclate caps and tabs, minocycline, tertracycline

## 2019-04-06 ENCOUNTER — Other Ambulatory Visit: Payer: Self-pay

## 2019-04-07 ENCOUNTER — Encounter: Payer: Self-pay | Admitting: Physician Assistant

## 2019-04-07 ENCOUNTER — Ambulatory Visit (INDEPENDENT_AMBULATORY_CARE_PROVIDER_SITE_OTHER): Payer: Self-pay | Admitting: Physician Assistant

## 2019-04-07 ENCOUNTER — Other Ambulatory Visit: Payer: Self-pay

## 2019-04-07 VITALS — BP 142/80 | HR 83 | Temp 99.6°F | Ht 70.0 in | Wt 342.0 lb

## 2019-04-07 DIAGNOSIS — G43809 Other migraine, not intractable, without status migrainosus: Secondary | ICD-10-CM

## 2019-04-07 DIAGNOSIS — G4459 Other complicated headache syndrome: Secondary | ICD-10-CM

## 2019-04-07 DIAGNOSIS — E119 Type 2 diabetes mellitus without complications: Secondary | ICD-10-CM

## 2019-04-07 DIAGNOSIS — G932 Benign intracranial hypertension: Secondary | ICD-10-CM

## 2019-04-07 DIAGNOSIS — I10 Essential (primary) hypertension: Secondary | ICD-10-CM

## 2019-04-07 LAB — BAYER DCA HB A1C WAIVED: HB A1C (BAYER DCA - WAIVED): 6.3 % (ref <7.0)

## 2019-04-07 MED ORDER — OXYCODONE HCL 10 MG PO TABS: 10.0000 mg | ORAL_TABLET | Freq: Four times a day (QID) | ORAL | 0 refills | Status: DC | PRN

## 2019-04-07 MED ORDER — SPIRONOLACTONE 50 MG PO TABS: 50.0000 mg | ORAL_TABLET | Freq: Every day | ORAL | 2 refills | Status: DC

## 2019-04-08 LAB — CBC WITH DIFFERENTIAL/PLATELET
Basophils Absolute: 0 10*3/uL (ref 0.0–0.2)
Basos: 1 %
EOS (ABSOLUTE): 0.1 10*3/uL (ref 0.0–0.4)
Eos: 2 %
Hematocrit: 39.3 % (ref 34.0–46.6)
Hemoglobin: 12.2 g/dL (ref 11.1–15.9)
Immature Grans (Abs): 0 10*3/uL (ref 0.0–0.1)
Immature Granulocytes: 0 %
Lymphocytes Absolute: 2 10*3/uL (ref 0.7–3.1)
Lymphs: 36 %
MCH: 26 pg — ABNORMAL LOW (ref 26.6–33.0)
MCHC: 31 g/dL — ABNORMAL LOW (ref 31.5–35.7)
MCV: 84 fL (ref 79–97)
Monocytes Absolute: 0.5 10*3/uL (ref 0.1–0.9)
Monocytes: 8 %
Neutrophils Absolute: 2.8 10*3/uL (ref 1.4–7.0)
Neutrophils: 53 %
Platelets: 212 10*3/uL (ref 150–450)
RBC: 4.69 x10E6/uL (ref 3.77–5.28)
RDW: 14.2 % (ref 11.7–15.4)
WBC: 5.4 10*3/uL (ref 3.4–10.8)

## 2019-04-08 LAB — CMP14+EGFR
ALT: 20 IU/L (ref 0–32)
AST: 16 IU/L (ref 0–40)
Albumin/Globulin Ratio: 1.2 (ref 1.2–2.2)
Albumin: 4.1 g/dL (ref 3.8–4.8)
Alkaline Phosphatase: 52 IU/L (ref 39–117)
BUN/Creatinine Ratio: 10 (ref 9–23)
BUN: 8 mg/dL (ref 6–24)
Bilirubin Total: 0.2 mg/dL (ref 0.0–1.2)
CO2: 21 mmol/L (ref 20–29)
Calcium: 9.1 mg/dL (ref 8.7–10.2)
Chloride: 101 mmol/L (ref 96–106)
Creatinine, Ser: 0.79 mg/dL (ref 0.57–1.00)
GFR calc Af Amer: 107 mL/min/{1.73_m2} (ref >59)
GFR calc non Af Amer: 93 mL/min/{1.73_m2} (ref >59)
Globulin, Total: 3.3 g/dL (ref 1.5–4.5)
Glucose: 123 mg/dL — ABNORMAL HIGH (ref 65–99)
Potassium: 3.7 mmol/L (ref 3.5–5.2)
Sodium: 138 mmol/L (ref 134–144)
Total Protein: 7.4 g/dL (ref 6.0–8.5)

## 2019-04-08 LAB — LIPID PANEL
Chol/HDL Ratio: 5.8 ratio — ABNORMAL HIGH (ref 0.0–4.4)
Cholesterol, Total: 210 mg/dL — ABNORMAL HIGH (ref 100–199)
HDL: 36 mg/dL — ABNORMAL LOW (ref >39)
LDL Chol Calc (NIH): 139 mg/dL — ABNORMAL HIGH (ref 0–99)
Triglycerides: 196 mg/dL — ABNORMAL HIGH (ref 0–149)
VLDL Cholesterol Cal: 35 mg/dL (ref 5–40)

## 2019-04-11 NOTE — Progress Notes (Signed)
Acute Office Visit  Subjective:    Patient ID: Vanessa [name], female    DOB: 1976-01-07, 44 y.o.   MRN: [id number]  Chief Complaint  Patient presents with  . Medical Management of Chronic Issues    78mreck/ Meds  . Leg Swelling    left knee, right ankle  . Hypertension  . Diabetes  . Hyperlipidemia  . Headache    Hypertension This is a chronic problem. The current episode started more than 1 year ago. The problem has been gradually improving since onset. The problem is controlled. Associated symptoms include headaches and peripheral edema. Pertinent negatives include no chest pain. Risk factors for coronary artery disease include diabetes mellitus, dyslipidemia, family history and obesity. Past treatments include ACE inhibitors and diuretics.  Diabetes She presents for her follow-up diabetic visit. She has type 2 diabetes mellitus. Hypoglycemia symptoms include headaches. Pertinent negatives for hypoglycemia include no confusion, dizziness or nervousness/anxiousness. Pertinent negatives for diabetes include no chest pain and no fatigue. There are no hypoglycemic complications. Risk factors for coronary artery disease include diabetes mellitus, dyslipidemia, hypertension, sedentary lifestyle and obesity. Her weight is decreasing steadily. She is following a diabetic diet. She rarely participates in exercise. An ACE inhibitor/angiotensin II receptor blocker is being taken. She sees a podiatrist.Eye exam is current.  Hyperlipidemia This is a chronic problem. The problem is uncontrolled. Associated symptoms include myalgias. Pertinent negatives include no chest pain. Current antihyperlipidemic treatment includes statins. Risk factors for coronary artery disease include diabetes mellitus, hypertension, dyslipidemia and obesity.  Headache  This is a chronic problem. The current episode started more than 1 year ago. The problem occurs intermittently. The problem has been gradually improving. The  pain is located in the bilateral region. The pain quality is similar to prior headaches. The quality of the pain is described as aching. Pertinent negatives include no abdominal pain, coughing, dizziness or fever. The treatment provided mild relief. Her past medical history is significant for hypertension.    Past Medical History:  Diagnosis Date  . Hypertension   . Migraines   . Pseudotumor (inflammatory) of orbit   . Sepsis ([place] 02/2019  . Vision loss     Past Surgical History:  Procedure Laterality Date  . CSF SHUNT      Family History  Problem Relation Age of Onset  . Pulmonary Hypertension Mother   . Congestive Heart Failure Mother   . Hypertension Sister     Social History   Socioeconomic History  . Marital status: Single    Spouse name: Not on file  . Number of children: Not on file  . Years of education: Not on file  . Highest education level: Not on file  Occupational History  . Not on file  Tobacco Use  . Smoking status: Never Smoker  . Smokeless tobacco: Never Used  Substance and Sexual Activity  . Alcohol use: No  . Drug use: No  . Sexual activity: Not on file  Other Topics Concern  . Not on file  Social History Narrative  . Not on file   Social Determinants of Health   Financial Resource Strain:   . Difficulty of Paying Living Expenses: Not on file  Food Insecurity:   . Worried About RCharity fundraiserin the Last Year: Not on file  . Ran Out of Food in the Last Year: Not on file  Transportation Needs:   . Lack of Transportation (Medical): Not on file  . Lack of Transportation (Non-Medical):  Not on file  Physical Activity:   . Days of Exercise per Week: Not on file  . Minutes of Exercise per Session: Not on file  Stress:   . Feeling of Stress : Not on file  Social Connections:   . Frequency of Communication with Friends and Family: Not on file  . Frequency of Social Gatherings with Friends and Family: Not on file  . Attends Religious  Services: Not on file  . Active Member of Clubs or Organizations: Not on file  . Attends Archivist Meetings: Not on file  . Marital Status: Not on file  Intimate Partner Violence:   . Fear of Current or Ex-Partner: Not on file  . Emotionally Abused: Not on file  . Physically Abused: Not on file  . Sexually Abused: Not on file    Outpatient Medications Prior to Visit  Medication Sig Dispense Refill  . acetaZOLAMIDE (DIAMOX) 250 MG tablet Take 2 tablets (500 mg total) by mouth 2 (two) times daily. 120 tablet 10  . allopurinol (ZYLOPRIM) 300 MG tablet Take 1 tablet (300 mg total) by mouth daily. 30 tablet 6  . amitriptyline (ELAVIL) 50 MG tablet Take 1 tablet (50 mg total) by mouth daily. 30 tablet 10  . atorvastatin (LIPITOR) 10 MG tablet Take 1 tablet (10 mg total) by mouth daily. 90 tablet 2  . Blood Glucose Monitoring Suppl (ONE TOUCH ULTRA 2) w/Device KIT Test once daily Dx E11.9 1 each 0  . cyclobenzaprine (FLEXERIL) 10 MG tablet Take 1 tablet (10 mg total) by mouth 3 (three) times daily as needed for muscle spasms. 60 tablet 0  . doxycycline (VIBRA-TABS) 100 MG tablet Take 1 tablet (100 mg total) by mouth 2 (two) times daily. 1 po bid 60 tablet 5  . FLUoxetine (PROZAC) 20 MG capsule Take 1 capsule by mouth every evening. 30 capsule 0  . gabapentin (NEURONTIN) 300 MG capsule Take 1 capsule (300 mg total) by mouth 2 (two) times daily. 90 capsule 5  . glucose blood (ONE TOUCH ULTRA TEST) test strip Test sugar daily Dx E11.9 100 each 3  . halobetasol (ULTRAVATE) 0.05 % cream Apply topically 2 (two) times daily. 50 g 5  . lisinopril (ZESTRIL) 40 MG tablet Take 1 tablet by mouth daily. 90 tablet 0  . loratadine (CLARITIN) 10 MG tablet Take 1 tablet (10 mg total) by mouth daily. 90 tablet 1  . metFORMIN (GLUCOPHAGE) 500 MG tablet Take 2 tablets by mouth twice daily with a meal. 180 tablet 2  . montelukast (SINGULAIR) 10 MG tablet Take 1 tablet by mouth at bedtime. 90 tablet 0  .  naratriptan (AMERGE) 2.5 MG tablet Take 1 tablet (2.5 mg total) by mouth as needed for migraine. Take one (1) tablet at onset of headache; if returns or does not resolve, may repeat after 4 hours; do not exceed five (5) mg in 24 hours. 10 tablet 5  . OneTouch Delica Lancets 31R MISC 1 each by Does not apply route daily. Dx E11.9 100 each 3  . Semaglutide,0.25 or 0.'5MG'$ /DOS, (OZEMPIC, 0.25 OR 0.5 MG/DOSE,) 2 MG/1.5ML SOPN Inject 0.5 mg into the skin once a week. 4 pen 3  . topiramate (TOPAMAX) 25 MG tablet     . furosemide (LASIX) 20 MG tablet Take 1 tablet by mouth daily. 30 tablet 5  . JANUVIA 50 MG tablet Take 1 tablet by mouth daily. 90 tablet 0  . mirtazapine (REMERON) 15 MG tablet     . Oxycodone  HCl 10 MG TABS Take 1 tablet (10 mg total) by mouth every 6 (six) hours as needed. 120 tablet 0  . Oxycodone HCl 10 MG TABS Take 1 tablet (10 mg total) by mouth 4 (four) times daily as needed (pain/headache). 120 tablet 0  . potassium chloride SA (KLOR-CON) 20 MEQ tablet Take 1 tablet by mouth daily. 90 tablet 0   No facility-administered medications prior to visit.    Allergies  Allergen Reactions  . Metrizamide Other (See Comments), Itching, Photosensitivity, Shortness Of Breath and Swelling    update Migraine instantly  . Mushroom Extract Complex Itching    Throat itching with cough  . Peanut Oil Anaphylaxis and Other (See Comments)    update  . Peanut-Containing Drug Products Itching    Itching throat with cough  . Diflucan In Dextrose [Fluconazole In Dextrose] Hives  . Iodine-131 Other (See Comments)    update    Review of Systems  Constitutional: Negative.  Negative for activity change, fatigue and fever.  HENT: Negative.   Eyes: Negative.   Respiratory: Negative.  Negative for cough.   Cardiovascular: Positive for leg swelling. Negative for chest pain.  Gastrointestinal: Negative.  Negative for abdominal pain.  Endocrine: Negative.   Genitourinary: Negative.  Negative for  dysuria.  Musculoskeletal: Positive for arthralgias and myalgias.  Skin: Negative.   Neurological: Positive for headaches. Negative for dizziness.  Psychiatric/Behavioral: Negative for confusion. The patient is not nervous/anxious.        Objective:    Physical Exam Constitutional:      General: She is not in acute distress.    Appearance: Normal appearance. She is well-developed.  HENT:     Head: Normocephalic and atraumatic.  Cardiovascular:     Rate and Rhythm: Normal rate.  Pulmonary:     Effort: Pulmonary effort is normal.  Skin:    General: Skin is warm and dry.     Findings: No rash.  Neurological:     Mental Status: She is alert and oriented to person, place, and time.     Deep Tendon Reflexes: Reflexes are normal and symmetric.    Diabetic Foot Exam - Simple   Simple Foot Form Diabetic Foot exam was performed with the following findings: Yes 04/07/2019  1:20 PM  Visual Inspection See comments: Yes Sensation Testing Intact to touch and monofilament testing bilaterally: Yes Pulse Check Posterior Tibialis and Dorsalis pulse intact bilaterally: Yes Comments thickened nails and calluses throughout, stasis dermatitis changes in distal legs       BP (!) 142/80   Pulse 83   Temp 99.6 F (37.6 C)   Ht '5\' 10"'$  (1.778 m)   Wt (!) 342 lb (155.1 kg)   LMP 04/01/2019   SpO2 98%   BMI 49.07 kg/m  Wt Readings from Last 3 Encounters:  04/07/19 (!) 342 lb (155.1 kg)  01/05/19 (!) 338 lb 6.4 oz (153.5 kg)  12/08/18 (!) 344 lb (156 kg)    Health Maintenance Due  Topic Date Due  . OPHTHALMOLOGY EXAM  05/18/1986    There are no preventive care reminders to display for this patient.   No results found for: TSH Lab Results  Component Value Date   WBC 5.4 04/07/2019   HGB 12.2 04/07/2019   HCT 39.3 04/07/2019   MCV 84 04/07/2019   PLT 212 04/07/2019   Lab Results  Component Value Date   NA 138 04/07/2019   K 3.7 04/07/2019   CO2 21 04/07/2019   GLUCOSE  123 (H) 04/07/2019   BUN 8 04/07/2019   CREATININE 0.79 04/07/2019   BILITOT 0.2 04/07/2019   ALKPHOS 52 04/07/2019   AST 16 04/07/2019   ALT 20 04/07/2019   PROT 7.4 04/07/2019   ALBUMIN 4.1 04/07/2019   CALCIUM 9.1 04/07/2019   Lab Results  Component Value Date   CHOL 210 (H) 04/07/2019   Lab Results  Component Value Date   HDL 36 (L) 04/07/2019   Lab Results  Component Value Date   LDLCALC 139 (H) 04/07/2019   Lab Results  Component Value Date   TRIG 196 (H) 04/07/2019   Lab Results  Component Value Date   CHOLHDL 5.8 (H) 04/07/2019   Lab Results  Component Value Date   HGBA1C 6.3 04/07/2019       Assessment & Plan:   Problem List Items Addressed This Visit      Cardiovascular and Mediastinum   Essential hypertension   Relevant Medications   spironolactone (ALDACTONE) 50 MG tablet   Migraine   Relevant Medications   spironolactone (ALDACTONE) 50 MG tablet   Oxycodone HCl 10 MG TABS   Oxycodone HCl 10 MG TABS     Endocrine   Diabetes mellitus without complication (HCC) - Primary   Relevant Orders   Bayer DCA Hb A1c Waived (Completed)   CBC with Differential/Platelet (Completed)   CMP14+EGFR (Completed)   Lipid panel (Completed)     Nervous and Auditory   IIH (idiopathic intracranial hypertension)   Pseudotumor cerebri   Relevant Medications   Oxycodone HCl 10 MG TABS     Other   Morbid obesity (HCC)   Other complicated headache syndrome   Relevant Medications   Oxycodone HCl 10 MG TABS   Oxycodone HCl 10 MG TABS      PLAN PODIATRY REFERRAL. Patient to call with provider in her area. Meds ordered this encounter  Medications  . spironolactone (ALDACTONE) 50 MG tablet    Sig: Take 1 tablet (50 mg total) by mouth daily.    Dispense:  30 tablet    Refill:  2    Order Specific Question:   Supervising Provider    Answer:   Janora Norlander [[id number]]  . Oxycodone HCl 10 MG TABS    Sig: Take 1 tablet (10 mg total) by mouth 4 (four) times  daily as needed (pain/headache).    Dispense:  120 tablet    Refill:  0    Fill on or after 05/07/19    Order Specific Question:   Supervising Provider    Answer:   Janora Norlander [[id number]]  . Oxycodone HCl 10 MG TABS    Sig: Take 1 tablet (10 mg total) by mouth every 6 (six) hours as needed.    Dispense:  120 tablet    Refill:  0    Order Specific Question:   Supervising Provider    Answer:   Janora Norlander [[id number]]     Terald Sleeper, PA-C   Terald Sleeper PA-C [facility] [address]  [place], [place] [address] [phone]

## 2019-04-14 ENCOUNTER — Other Ambulatory Visit: Payer: Self-pay | Admitting: Physician Assistant

## 2019-04-14 MED ORDER — ATORVASTATIN CALCIUM 20 MG PO TABS: 20.0000 mg | ORAL_TABLET | Freq: Every day | ORAL | 5 refills | Status: DC

## 2019-04-30 ENCOUNTER — Ambulatory Visit (INDEPENDENT_AMBULATORY_CARE_PROVIDER_SITE_OTHER): Payer: Medicare Other | Admitting: Physician Assistant

## 2019-04-30 ENCOUNTER — Encounter: Payer: Self-pay | Admitting: Physician Assistant

## 2019-04-30 ENCOUNTER — Other Ambulatory Visit: Payer: Self-pay

## 2019-04-30 DIAGNOSIS — E119 Type 2 diabetes mellitus without complications: Secondary | ICD-10-CM | POA: Diagnosis not present

## 2019-04-30 DIAGNOSIS — I10 Essential (primary) hypertension: Secondary | ICD-10-CM

## 2019-04-30 MED ORDER — LISINOPRIL 40 MG PO TABS: 40.0000 mg | ORAL_TABLET | Freq: Every day | ORAL | 3 refills | Status: DC

## 2019-05-06 NOTE — Progress Notes (Signed)
BP 115/72   Pulse 90   Temp 98.6 F (37 C)   Ht '5\' 10"'$  (1.778 m)   Wt (!) 333 lb 12.8 oz (151.4 kg)   LMP 04/30/2019 (Approximate)   SpO2 98%   BMI 47.90 kg/m    Subjective:    Patient ID: Vanessa [name], female    DOB: 03/25/1975, 44 y.o.   MRN: [id number]  Hypertension The current episode started more than 1 year ago. The problem is controlled. Pertinent negatives include no chest pain. There are no associated agents to hypertension. Risk factors for coronary artery disease include obesity and diabetes mellitus. The current treatment provides significant improvement.  Diabetes She presents for her follow-up diabetic visit. She has type 2 diabetes mellitus. There are no hypoglycemic associated symptoms. Pertinent negatives for diabetes include no chest pain and no fatigue. There are no hypoglycemic complications. Symptoms are improving. Current diabetic treatment includes oral agent (dual therapy). She is following a diabetic and generally healthy diet.      Past Medical History:  Diagnosis Date  . Hypertension   . Migraines   . Pseudotumor (inflammatory) of orbit   . Sepsis ([place]) 02/2019  . Vision loss    Relevant past medical, surgical, family and social history reviewed and updated as indicated. Interim medical history since our last visit reviewed. Allergies and medications reviewed and updated. DATA REVIEWED: CHART IN EPIC  Family History reviewed for pertinent findings.  Review of Systems  Constitutional: Negative.  Negative for activity change, fatigue and fever.  HENT: Negative.   Eyes: Negative.   Respiratory: Negative.  Negative for cough.   Cardiovascular: Negative.  Negative for chest pain.  Gastrointestinal: Negative.  Negative for abdominal pain.  Endocrine: Negative.   Genitourinary: Negative.  Negative for dysuria.  Musculoskeletal: Negative.   Skin: Negative.   Neurological: Negative.     Allergies as of 04/30/2019      Reactions   Metrizamide  Other (See Comments), Itching, Photosensitivity, Shortness Of Breath, Swelling   update Migraine instantly   Mushroom Extract Complex Itching   Throat itching with cough   Peanut Oil Anaphylaxis, Other (See Comments)   update   Peanut-containing Drug Products Itching   Itching throat with cough   Diflucan In Dextrose [fluconazole In Dextrose] Hives   Iodine-131 Other (See Comments)   update      Medication List       Accurate as of April 30, 2019 11:59 PM. If you have any questions, ask your nurse or doctor.        acetaZOLAMIDE 250 MG tablet Commonly known as: DIAMOX Take 2 tablets (500 mg total) by mouth 2 (two) times daily.   allopurinol 300 MG tablet Commonly known as: ZYLOPRIM Take 1 tablet (300 mg total) by mouth daily.   amitriptyline 50 MG tablet Commonly known as: ELAVIL Take 1 tablet (50 mg total) by mouth daily.   atorvastatin 20 MG tablet Commonly known as: LIPITOR Take 1 tablet (20 mg total) by mouth daily.   cyclobenzaprine 10 MG tablet Commonly known as: FLEXERIL Take 1 tablet (10 mg total) by mouth 3 (three) times daily as needed for muscle spasms.   doxycycline 100 MG tablet Commonly known as: VIBRA-TABS Take 1 tablet (100 mg total) by mouth 2 (two) times daily. 1 po bid   FLUoxetine 20 MG capsule Commonly known as: PROZAC Take 1 capsule by mouth every evening.   gabapentin 300 MG capsule Commonly known as: NEURONTIN Take 1 capsule (300 mg  total) by mouth 2 (two) times daily.   glucose blood test strip Commonly known as: ONE TOUCH ULTRA TEST Test sugar daily Dx E11.9   halobetasol 0.05 % cream Commonly known as: ULTRAVATE Apply topically 2 (two) times daily.   lisinopril 40 MG tablet Commonly known as: ZESTRIL Take 1 tablet (40 mg total) by mouth daily.   loratadine 10 MG tablet Commonly known as: CLARITIN Take 1 tablet (10 mg total) by mouth daily.   metFORMIN 500 MG tablet Commonly known as: GLUCOPHAGE Take 2 tablets by mouth  twice daily with a meal.   montelukast 10 MG tablet Commonly known as: SINGULAIR Take 1 tablet by mouth at bedtime.   naratriptan 2.5 MG tablet Commonly known as: AMERGE Take 1 tablet (2.5 mg total) by mouth as needed for migraine. Take one (1) tablet at onset of headache; if returns or does not resolve, may repeat after 4 hours; do not exceed five (5) mg in 24 hours.   ONE TOUCH ULTRA 2 w/Device Kit Test once daily Dx M78.6   OneTouch Delica Lancets 75Q Misc 1 each by Does not apply route daily. Dx E11.9   Oxycodone HCl 10 MG Tabs Take 1 tablet (10 mg total) by mouth 4 (four) times daily as needed (pain/headache).   Oxycodone HCl 10 MG Tabs Take 1 tablet (10 mg total) by mouth every 6 (six) hours as needed.   Ozempic (0.25 or 0.5 MG/DOSE) 2 MG/1.5ML Sopn Generic drug: Semaglutide(0.25 or 0.'5MG'$ /DOS) Inject 0.5 mg into the skin once a week.   spironolactone 50 MG tablet Commonly known as: Aldactone Take 1 tablet (50 mg total) by mouth daily.   topiramate 25 MG tablet Commonly known as: TOPAMAX          Objective:    BP 115/72   Pulse 90   Temp 98.6 F (37 C)   Ht '5\' 10"'$  (1.778 m)   Wt (!) 333 lb 12.8 oz (151.4 kg)   LMP 04/30/2019 (Approximate)   SpO2 98%   BMI 47.90 kg/m   Allergies  Allergen Reactions  . Metrizamide Other (See Comments), Itching, Photosensitivity, Shortness Of Breath and Swelling    update Migraine instantly  . Mushroom Extract Complex Itching    Throat itching with cough  . Peanut Oil Anaphylaxis and Other (See Comments)    update  . Peanut-Containing Drug Products Itching    Itching throat with cough  . Diflucan In Dextrose [Fluconazole In Dextrose] Hives  . Iodine-131 Other (See Comments)    update    Wt Readings from Last 3 Encounters:  04/30/19 (!) 333 lb 12.8 oz (151.4 kg)  04/07/19 (!) 342 lb (155.1 kg)  01/05/19 (!) 338 lb 6.4 oz (153.5 kg)    Physical Exam Constitutional:      General: She is not in acute distress.     Appearance: Normal appearance. She is well-developed.  HENT:     Head: Normocephalic and atraumatic.  Cardiovascular:     Rate and Rhythm: Normal rate.  Pulmonary:     Effort: Pulmonary effort is normal.  Skin:    General: Skin is warm and dry.     Findings: No rash.  Neurological:     Mental Status: She is alert and oriented to person, place, and time.     Deep Tendon Reflexes: Reflexes are normal and symmetric.     Results for orders placed or performed in visit on 04/07/19  Bayer DCA Hb A1c Waived  Result Value Ref Range  HB A1C (BAYER DCA - WAIVED) 6.3 <7.0 %  CBC with Differential/Platelet  Result Value Ref Range   WBC 5.4 3.4 - 10.8 x10E3/uL   RBC 4.69 3.77 - 5.28 x10E6/uL   Hemoglobin 12.2 11.1 - 15.9 g/dL   Hematocrit 39.3 34.0 - 46.6 %   MCV 84 79 - 97 fL   MCH 26.0 (L) 26.6 - 33.0 pg   MCHC 31.0 (L) 31.5 - 35.7 g/dL   RDW 14.2 11.7 - 15.4 %   Platelets 212 150 - 450 x10E3/uL   Neutrophils 53 Not Estab. %   Lymphs 36 Not Estab. %   Monocytes 8 Not Estab. %   Eos 2 Not Estab. %   Basos 1 Not Estab. %   Neutrophils Absolute 2.8 1.4 - 7.0 x10E3/uL   Lymphocytes Absolute 2.0 0.7 - 3.1 x10E3/uL   Monocytes Absolute 0.5 0.1 - 0.9 x10E3/uL   EOS (ABSOLUTE) 0.1 0.0 - 0.4 x10E3/uL   Basophils Absolute 0.0 0.0 - 0.2 x10E3/uL   Immature Granulocytes 0 Not Estab. %   Immature Grans (Abs) 0.0 0.0 - 0.1 x10E3/uL  CMP14+EGFR  Result Value Ref Range   Glucose 123 (H) 65 - 99 mg/dL   BUN 8 6 - 24 mg/dL   Creatinine, Ser 0.79 0.57 - 1.00 mg/dL   GFR calc non Af Amer 93 >59 mL/min/1.73   GFR calc Af Amer 107 >59 mL/min/1.73   BUN/Creatinine Ratio 10 9 - 23   Sodium 138 134 - 144 mmol/L   Potassium 3.7 3.5 - 5.2 mmol/L   Chloride 101 96 - 106 mmol/L   CO2 21 20 - 29 mmol/L   Calcium 9.1 8.7 - 10.2 mg/dL   Total Protein 7.4 6.0 - 8.5 g/dL   Albumin 4.1 3.8 - 4.8 g/dL   Globulin, Total 3.3 1.5 - 4.5 g/dL   Albumin/Globulin Ratio 1.2 1.2 - 2.2   Bilirubin Total 0.2 0.0  - 1.2 mg/dL   Alkaline Phosphatase 52 39 - 117 IU/L   AST 16 0 - 40 IU/L   ALT 20 0 - 32 IU/L  Lipid panel  Result Value Ref Range   Cholesterol, Total 210 (H) 100 - 199 mg/dL   Triglycerides 196 (H) 0 - 149 mg/dL   HDL 36 (L) >39 mg/dL   VLDL Cholesterol Cal 35 5 - 40 mg/dL   LDL Chol Calc (NIH) 139 (H) 0 - 99 mg/dL   Chol/HDL Ratio 5.8 (H) 0.0 - 4.4 ratio      Assessment & Plan:   1. Essential hypertension - lisinopril (ZESTRIL) 40 MG tablet; Take 1 tablet (40 mg total) by mouth daily.  Dispense: 90 tablet; Refill: 3  2. Morbid obesity ([place]) Continue medications  3. Diabetes mellitus without complication (HCC) conrinuw medications   Continue all other maintenance medications as listed above.  Follow up plan: Return in about 2 months (around 06/30/2019).  Educational handout given for [place] PA-C [facility] [address]  [place], [place] [address] [phone]   05/06/2019, 5:12 PM

## 2019-05-11 ENCOUNTER — Other Ambulatory Visit: Payer: Self-pay | Admitting: Physician Assistant

## 2019-05-11 DIAGNOSIS — G4459 Other complicated headache syndrome: Secondary | ICD-10-CM

## 2019-05-11 LAB — HM DIABETES EYE EXAM

## 2019-06-04 ENCOUNTER — Other Ambulatory Visit: Payer: Self-pay | Admitting: *Deleted

## 2019-06-04 MED ORDER — LORATADINE 10 MG PO TABS: 10.0000 mg | ORAL_TABLET | Freq: Every day | ORAL | 1 refills | Status: DC

## 2019-06-09 ENCOUNTER — Other Ambulatory Visit: Payer: Self-pay | Admitting: *Deleted

## 2019-06-09 ENCOUNTER — Encounter: Payer: Self-pay | Admitting: *Deleted

## 2019-06-09 MED ORDER — GLUCOSE BLOOD VI STRP: ORAL_STRIP | 3 refills | Status: DC

## 2019-07-05 ENCOUNTER — Other Ambulatory Visit: Payer: Self-pay | Admitting: *Deleted

## 2019-07-05 DIAGNOSIS — E785 Hyperlipidemia, unspecified: Secondary | ICD-10-CM | POA: Insufficient documentation

## 2019-07-05 MED ORDER — MONTELUKAST SODIUM 10 MG PO TABS: 10.0000 mg | ORAL_TABLET | Freq: Every day | ORAL | 1 refills | Status: DC

## 2019-07-05 NOTE — Progress Notes (Signed)
Assessment & Plan:  1. Diabetes mellitus without complication ([place]) Lab Results  Component Value Date   HGBA1C 6.9 07/06/2019   HGBA1C 6.3 04/07/2019   HGBA1C 8.9 (H) 10/02/2018    - Diabetes is at goal of A1c < 7. - Medications: continue current medications - Patient is currently taking a statin. Patient is taking an ACE-inhibitor/ARB.  - Last foot exam: 04/07/2019 - Last diabetic eye exam: 05/11/2019 - Urine Microalbumin/Creat Ratio: 07/06/2019 - CMP14+EGFR - Lipid panel - Bayer DCA Hb A1c Waived - Microalbumin / creatinine urine ratio  2. Hyperlipidemia associated with type 2 diabetes mellitus (HCC) - CMP14+EGFR - Lipid panel  3. Other migraine without status migrainosus, not intractable - Discussed that oxycodone should not be used for migraines. Encouraged patient to schedule appointment with her neurologist for follow-up. In the meantime, I have increased the Topamax from 25 mg to 50 mg once daily. I have also given her Nurtec QD PRN for abortive treatment. Today she was given depo-medrol, toradol, and phenergan to knock out the migraine she is currently experiencing. She did not drive herself to the office today.  - topiramate (TOPAMAX) 50 MG tablet; Take 1 tablet (50 mg total) by mouth daily.  Dispense: 30 tablet; Refill: 2 - Rimegepant Sulfate (NURTEC) 75 MG TBDP; Take 75 mg by mouth daily as needed.  Dispense: 10 tablet; Refill: 2 - methylPREDNISolone acetate (DEPO-MEDROL) injection 80 mg - ketorolac (TORADOL) injection 60 mg - promethazine (PHENERGAN) injection 25 mg  4. Essential hypertension - Elevated today but patient feels it is due to pain. Encouraged to monitor at home.  - spironolactone (ALDACTONE) 50 MG tablet; Take 1 tablet (50 mg total) by mouth daily.  Dispense: 90 tablet; Refill: 1  5. Chronic pain of left knee - Oxycodone HCl 10 MG TABS; Take 1 tablet (10 mg total) by mouth 2 (two) times daily as needed.  Dispense: 60 tablet; Refill: 0 - Compliance Drug  Analysis, Ur - Ambulatory referral to Orthopedic Surgery  6. Controlled substance agreement signed - Signed for oxycodone. Discussed this is not to be used for cellulitis or headaches, only knee pain which we are referring to ortho for.   7. Tick bite of left upper arm, initial encounter - Labs placed as future since tick bite was just a week ago.  - Lyme Ab/Western Blot Reflex; Future - Rocky mtn spotted fvr abs pnl(IgG+IgM); Future  8. Encounter for screening for HIV - HIV Antibody (routine testing w rflx)   Return in about 4 weeks (around 08/03/2019) for migraines, pain.  Hendricks Limes, MSN, APRN, FNP-C [facility]  Subjective:    Patient ID: Vanessa [name], female    DOB: Jan 15, 1976, 44 y.o.   MRN: [id number]  Patient Care Team: Loman Brooklyn, FNP as PCP - General (Family Medicine)   Chief Complaint:  Chief Complaint  Patient presents with  . Establish Care    jones pt   . Diabetes    3 month follow up  . Tick Removal    would like to be tested for Lymes    HPI: Vanessa [name] is a 44 y.o. female presenting on 07/06/2019 for Establish Care (jones pt ), Diabetes (3 month follow up), and Tick Removal (would like to be tested for Lymes)  BP elevated today. Patient feels it is related to the migraine she has had for the past week. She has taken her medications but is unable to keep them down most of the time due to  vomiting. She takes Topamax 25 mg QD for prevention which she reports has not been effective. She was put back on this in October 2020. She also takes amitriptyline 50 mg QD. Prior to this, she had not seen her neurologist since 2019. She has taken the Amerge as well. Patient reports her BP at home has not been > 145/83 in months until this migraine.   Patient reports she takes Oxycodone for cellulitis in the RLE, headaches, and left knee pain from where her ankle is turning on her. She would like a referral to ortho for her ankle. Patient  reports some days she does not take any Oxycodone, some days she takes 1-2. States 120 tablets lasts 2-3 months.   Diabetes: Patient presents for follow up of diabetes. Current symptoms include: none. Known diabetic complications: none. Medication compliance: yes. Is she  on ACE inhibitor or angiotensin II receptor blocker? Yes. Is she on a statin? Yes.   Lab Results  Component Value Date   HGBA1C 6.9 07/06/2019   HGBA1C 6.3 04/07/2019   HGBA1C 8.9 (H) 10/02/2018   Lab Results  Component Value Date   LDLCALC 132 (H) 07/06/2019   CREATININE 0.79 07/06/2019     Patient is requesting lab work since she pulled a tick off of her about a week ago. She has been having this migraine with nausea and vomiting.    Social history:  Relevant past medical, surgical, family and social history reviewed and updated as indicated. Interim medical history since our last visit reviewed.  Allergies and medications reviewed and updated.  DATA REVIEWED: CHART IN EPIC  ROS: Negative unless specifically indicated above in HPI.    Current Outpatient Medications:  .  acetaZOLAMIDE (DIAMOX) 250 MG tablet, Take 2 tablets (500 mg total) by mouth 2 (two) times daily., Disp: 120 tablet, Rfl: 10 .  allopurinol (ZYLOPRIM) 300 MG tablet, Take 1 tablet (300 mg total) by mouth daily., Disp: 30 tablet, Rfl: 6 .  amitriptyline (ELAVIL) 50 MG tablet, Take 1 tablet (50 mg total) by mouth daily., Disp: 30 tablet, Rfl: 10 .  cyclobenzaprine (FLEXERIL) 10 MG tablet, Take 1 tablet (10 mg total) by mouth 3 (three) times daily as needed for muscle spasms., Disp: 60 tablet, Rfl: 0 .  doxycycline (VIBRA-TABS) 100 MG tablet, Take 1 tablet (100 mg total) by mouth 2 (two) times daily. 1 po bid, Disp: 60 tablet, Rfl: 5 .  FLUoxetine (PROZAC) 20 MG capsule, Take 1 capsule by mouth every evening., Disp: 30 capsule, Rfl: 11 .  gabapentin (NEURONTIN) 300 MG capsule, Take 300 mg by mouth 2 (two) times daily., Disp: , Rfl:  .  glucose  blood (ONE TOUCH ULTRA TEST) test strip, Test sugar daily Dx E11.9, Disp: 100 each, Rfl: 3 .  halobetasol (ULTRAVATE) 0.05 % cream, Apply topically 2 (two) times daily., Disp: 50 g, Rfl: 5 .  lisinopril (ZESTRIL) 40 MG tablet, Take 1 tablet (40 mg total) by mouth daily., Disp: 90 tablet, Rfl: 3 .  loratadine (CLARITIN) 10 MG tablet, Take 1 tablet (10 mg total) by mouth daily., Disp: 90 tablet, Rfl: 1 .  montelukast (SINGULAIR) 10 MG tablet, Take 1 tablet (10 mg total) by mouth at bedtime., Disp: 90 tablet, Rfl: 1 .  naratriptan (AMERGE) 2.5 MG tablet, Take 1 tablet (2.5 mg total) by mouth as needed for migraine. Take one (1) tablet at onset of headache; if returns or does not resolve, may repeat after 4 hours; do not exceed five (5) mg  in 24 hours., Disp: 10 tablet, Rfl: 5 .  OneTouch Delica Lancets 12X MISC, 1 each by Does not apply route daily. Dx E11.9, Disp: 100 each, Rfl: 3 .  Oxycodone HCl 10 MG TABS, Take 1 tablet (10 mg total) by mouth 4 (four) times daily as needed (pain/headache)., Disp: 120 tablet, Rfl: 0 .  Oxycodone HCl 10 MG TABS, Take 1 tablet (10 mg total) by mouth 2 (two) times daily as needed., Disp: 60 tablet, Rfl: 0 .  Semaglutide,0.25 or 0.5MG/DOS, (OZEMPIC, 0.25 OR 0.5 MG/DOSE,) 2 MG/1.5ML SOPN, Inject 0.5 mg into the skin once a week., Disp: 4 pen, Rfl: 3 .  spironolactone (ALDACTONE) 50 MG tablet, Take 1 tablet (50 mg total) by mouth daily., Disp: 90 tablet, Rfl: 1 .  topiramate (TOPAMAX) 50 MG tablet, Take 1 tablet (50 mg total) by mouth daily., Disp: 30 tablet, Rfl: 2 .  atorvastatin (LIPITOR) 40 MG tablet, Take 1 tablet (40 mg total) by mouth daily., Disp: 30 tablet, Rfl: 2 .  Rimegepant Sulfate (NURTEC) 75 MG TBDP, Take 75 mg by mouth daily as needed., Disp: 10 tablet, Rfl: 2   Allergies  Allergen Reactions  . Metrizamide Other (See Comments), Itching, Photosensitivity, Shortness Of Breath and Swelling    update Migraine instantly  . Mushroom Extract Complex Itching     Throat itching with cough  . Peanut Oil Anaphylaxis and Other (See Comments)    update  . Peanut-Containing Drug Products Itching    Itching throat with cough  . Diflucan In Dextrose [Fluconazole In Dextrose] Hives  . Iodine-131 Other (See Comments)    update   Past Medical History:  Diagnosis Date  . Hypertension   . Migraines   . Pseudotumor (inflammatory) of orbit   . Sepsis ([place]) 02/2019  . Vision loss     Past Surgical History:  Procedure Laterality Date  . CSF SHUNT      Social History   Socioeconomic History  . Marital status: Single    Spouse name: Not on file  . Number of children: Not on file  . Years of education: Not on file  . Highest education level: Not on file  Occupational History  . Not on file  Tobacco Use  . Smoking status: Never Smoker  . Smokeless tobacco: Never Used  Substance and Sexual Activity  . Alcohol use: No  . Drug use: No  . Sexual activity: Not on file  Other Topics Concern  . Not on file  Social History Narrative  . Not on file   Social Determinants of Health   Financial Resource Strain:   . Difficulty of Paying Living Expenses:   Food Insecurity:   . Worried About Charity fundraiser in the Last Year:   . Arboriculturist in the Last Year:   Transportation Needs:   . Film/video editor (Medical):   Marland Kitchen Lack of Transportation (Non-Medical):   Physical Activity:   . Days of Exercise per Week:   . Minutes of Exercise per Session:   Stress:   . Feeling of Stress :   Social Connections:   . Frequency of Communication with Friends and Family:   . Frequency of Social Gatherings with Friends and Family:   . Attends Religious Services:   . Active Member of Clubs or Organizations:   . Attends Archivist Meetings:   Marland Kitchen Marital Status:   Intimate Partner Violence:   . Fear of Current or Ex-Partner:   .  Emotionally Abused:   Marland Kitchen Physically Abused:   . Sexually Abused:         Objective:    BP (!) 180/114    Pulse 88   Temp 98.1 F (36.7 C) (Temporal)   Ht _0  (1.778 m)   Wt (!) 351 lb 9.6 oz (159.5 kg)   SpO2 99%   BMI 50.45 kg/m   Wt Readings from Last 3 Encounters:  07/06/19 (!) 351 lb 9.6 oz (159.5 kg)  04/30/19 (!) 333 lb 12.8 oz (151.4 kg)  04/07/19 (!) 342 lb (155.1 kg)    Physical Exam Vitals reviewed.  Constitutional:      General: She is not in acute distress.    Appearance: Normal appearance. She is morbidly obese. She is not ill-appearing, toxic-appearing or diaphoretic.  HENT:     Head: Normocephalic and atraumatic.  Eyes:     General: No scleral icterus.       Right eye: No discharge.        Left eye: No discharge.     Conjunctiva/sclera: Conjunctivae normal.  Cardiovascular:     Rate and Rhythm: Normal rate and regular rhythm.     Heart sounds: Normal heart sounds. No murmur. No friction rub. No gallop.   Pulmonary:     Effort: Pulmonary effort is normal. No respiratory distress.     Breath sounds: Normal breath sounds. No stridor. No wheezing, rhonchi or rales.  Musculoskeletal:        General: Normal range of motion.     Cervical back: Normal range of motion.  Skin:    General: Skin is warm and dry.     Capillary Refill: Capillary refill takes less than 2 seconds.  Neurological:     General: No focal deficit present.     Mental Status: She is alert and oriented to person, place, and time. Mental status is at baseline.  Psychiatric:        Mood and Affect: Mood normal.        Behavior: Behavior normal.        Thought Content: Thought content normal.        Judgment: Judgment normal.     No results found for: TSH Lab Results  Component Value Date   WBC 5.4 04/07/2019   HGB 12.2 04/07/2019   HCT 39.3 04/07/2019   MCV 84 04/07/2019   PLT 212 04/07/2019   Lab Results  Component Value Date   NA 140 07/06/2019   K 4.2 07/06/2019   CO2 27 07/06/2019   GLUCOSE 99 07/06/2019   BUN 7 07/06/2019   CREATININE 0.79 07/06/2019   BILITOT 0.2  07/06/2019   ALKPHOS 51 07/06/2019   AST 23 07/06/2019   ALT 24 07/06/2019   PROT 7.6 07/06/2019   ALBUMIN 4.1 07/06/2019   CALCIUM 9.5 07/06/2019   Lab Results  Component Value Date   CHOL 197 07/06/2019   Lab Results  Component Value Date   HDL 39 (L) 07/06/2019   Lab Results  Component Value Date   LDLCALC 132 (H) 07/06/2019   Lab Results  Component Value Date   TRIG 143 07/06/2019   Lab Results  Component Value Date   CHOLHDL 5.1 (H) 07/06/2019   Lab Results  Component Value Date   HGBA1C 6.9 07/06/2019

## 2019-07-06 ENCOUNTER — Other Ambulatory Visit: Payer: Self-pay

## 2019-07-06 ENCOUNTER — Ambulatory Visit (INDEPENDENT_AMBULATORY_CARE_PROVIDER_SITE_OTHER): Payer: Medicare Other | Admitting: Family Medicine

## 2019-07-06 ENCOUNTER — Telehealth: Payer: Self-pay | Admitting: Pharmacist

## 2019-07-06 ENCOUNTER — Ambulatory Visit: Payer: Self-pay | Admitting: Physician Assistant

## 2019-07-06 VITALS — BP 180/114 | HR 88 | Temp 98.1°F | Ht 70.0 in | Wt 351.6 lb

## 2019-07-06 DIAGNOSIS — Z79899 Other long term (current) drug therapy: Secondary | ICD-10-CM

## 2019-07-06 DIAGNOSIS — E785 Hyperlipidemia, unspecified: Secondary | ICD-10-CM

## 2019-07-06 DIAGNOSIS — S40862A Insect bite (nonvenomous) of left upper arm, initial encounter: Secondary | ICD-10-CM

## 2019-07-06 DIAGNOSIS — G932 Benign intracranial hypertension: Secondary | ICD-10-CM

## 2019-07-06 DIAGNOSIS — G4459 Other complicated headache syndrome: Secondary | ICD-10-CM

## 2019-07-06 DIAGNOSIS — M25562 Pain in left knee: Secondary | ICD-10-CM

## 2019-07-06 DIAGNOSIS — E1169 Type 2 diabetes mellitus with other specified complication: Secondary | ICD-10-CM | POA: Diagnosis not present

## 2019-07-06 DIAGNOSIS — G8929 Other chronic pain: Secondary | ICD-10-CM

## 2019-07-06 DIAGNOSIS — G43809 Other migraine, not intractable, without status migrainosus: Secondary | ICD-10-CM

## 2019-07-06 DIAGNOSIS — E119 Type 2 diabetes mellitus without complications: Secondary | ICD-10-CM

## 2019-07-06 DIAGNOSIS — I10 Essential (primary) hypertension: Secondary | ICD-10-CM

## 2019-07-06 DIAGNOSIS — W57XXXA Bitten or stung by nonvenomous insect and other nonvenomous arthropods, initial encounter: Secondary | ICD-10-CM

## 2019-07-06 DIAGNOSIS — Z114 Encounter for screening for human immunodeficiency virus [HIV]: Secondary | ICD-10-CM

## 2019-07-06 LAB — BAYER DCA HB A1C WAIVED: HB A1C (BAYER DCA - WAIVED): 6.9 % (ref <7.0)

## 2019-07-06 MED ORDER — OXYCODONE HCL 10 MG PO TABS: 10.0000 mg | ORAL_TABLET | Freq: Two times a day (BID) | ORAL | 0 refills | Status: DC | PRN

## 2019-07-06 MED ORDER — KETOROLAC TROMETHAMINE 60 MG/2ML IM SOLN
60.0000 mg | Freq: Once | INTRAMUSCULAR | Status: AC
  Administered 2019-07-06: 60 mg via INTRAMUSCULAR

## 2019-07-06 MED ORDER — TOPIRAMATE 50 MG PO TABS: 50.0000 mg | ORAL_TABLET | Freq: Every day | ORAL | 2 refills | Status: DC

## 2019-07-06 MED ORDER — NURTEC 75 MG PO TBDP: 75.0000 mg | ORAL_TABLET | Freq: Every day | ORAL | 2 refills | Status: DC | PRN

## 2019-07-06 MED ORDER — PROMETHAZINE HCL 25 MG/ML IJ SOLN
25.0000 mg | Freq: Once | INTRAMUSCULAR | Status: AC
  Administered 2019-07-06: 25 mg via INTRAMUSCULAR

## 2019-07-06 MED ORDER — METHYLPREDNISOLONE ACETATE 80 MG/ML IJ SUSP
80.0000 mg | Freq: Once | INTRAMUSCULAR | Status: AC
  Administered 2019-07-06: 80 mg via INTRAMUSCULAR

## 2019-07-06 MED ORDER — SPIRONOLACTONE 50 MG PO TABS: 50.0000 mg | ORAL_TABLET | Freq: Every day | ORAL | 1 refills | Status: DC

## 2019-07-06 NOTE — Telephone Encounter (Signed)
  Patient with migraines on topamax daily for prophylaxis and naratriptan PRN for abortive therapy.  Counseled patient to take Nurtec 75 mg ODT (1 tablet daily as needed for migraine).  Discussed risks vs benefits.  Encouraged patient to f/u with Neuro as her headaches have been more frequent.  Nurtec 75mg  #4 tabs given [id number], EXP 10/22

## 2019-07-07 LAB — CMP14+EGFR
ALT: 24 IU/L (ref 0–32)
AST: 23 IU/L (ref 0–40)
Albumin/Globulin Ratio: 1.2 (ref 1.2–2.2)
Albumin: 4.1 g/dL (ref 3.8–4.8)
Alkaline Phosphatase: 51 IU/L (ref 39–117)
BUN/Creatinine Ratio: 9 (ref 9–23)
BUN: 7 mg/dL (ref 6–24)
Bilirubin Total: 0.2 mg/dL (ref 0.0–1.2)
CO2: 27 mmol/L (ref 20–29)
Calcium: 9.5 mg/dL (ref 8.7–10.2)
Chloride: 102 mmol/L (ref 96–106)
Creatinine, Ser: 0.79 mg/dL (ref 0.57–1.00)
GFR calc Af Amer: 106 mL/min/{1.73_m2} (ref >59)
GFR calc non Af Amer: 92 mL/min/{1.73_m2} (ref >59)
Globulin, Total: 3.5 g/dL (ref 1.5–4.5)
Glucose: 99 mg/dL (ref 65–99)
Potassium: 4.2 mmol/L (ref 3.5–5.2)
Sodium: 140 mmol/L (ref 134–144)
Total Protein: 7.6 g/dL (ref 6.0–8.5)

## 2019-07-07 LAB — MICROALBUMIN / CREATININE URINE RATIO
Creatinine, Urine: 84.7 mg/dL
Microalb/Creat Ratio: 23 mg/g creat (ref 0–29)
Microalbumin, Urine: 19.5 ug/mL

## 2019-07-07 LAB — LIPID PANEL
Chol/HDL Ratio: 5.1 ratio — ABNORMAL HIGH (ref 0.0–4.4)
Cholesterol, Total: 197 mg/dL (ref 100–199)
HDL: 39 mg/dL — ABNORMAL LOW (ref >39)
LDL Chol Calc (NIH): 132 mg/dL — ABNORMAL HIGH (ref 0–99)
Triglycerides: 143 mg/dL (ref 0–149)
VLDL Cholesterol Cal: 26 mg/dL (ref 5–40)

## 2019-07-07 LAB — HIV ANTIBODY (ROUTINE TESTING W REFLEX): HIV Screen 4th Generation wRfx: NONREACTIVE

## 2019-07-09 ENCOUNTER — Other Ambulatory Visit: Payer: Self-pay | Admitting: Family Medicine

## 2019-07-09 DIAGNOSIS — E1169 Type 2 diabetes mellitus with other specified complication: Secondary | ICD-10-CM

## 2019-07-09 LAB — COMPLIANCE DRUG ANALYSIS, UR

## 2019-07-09 MED ORDER — ATORVASTATIN CALCIUM 40 MG PO TABS: 40.0000 mg | ORAL_TABLET | Freq: Every day | ORAL | 2 refills | Status: DC

## 2019-07-12 ENCOUNTER — Encounter: Payer: Self-pay | Admitting: Family Medicine

## 2019-07-27 ENCOUNTER — Telehealth: Payer: Self-pay | Admitting: Family Medicine

## 2019-07-28 ENCOUNTER — Ambulatory Visit: Payer: Medicare Other | Admitting: Orthopaedic Surgery

## 2019-07-28 ENCOUNTER — Other Ambulatory Visit: Payer: Self-pay | Admitting: Family Medicine

## 2019-07-28 ENCOUNTER — Other Ambulatory Visit: Payer: Self-pay

## 2019-07-28 DIAGNOSIS — G43809 Other migraine, not intractable, without status migrainosus: Secondary | ICD-10-CM

## 2019-07-29 NOTE — Telephone Encounter (Signed)
Spoke to pt and she says she was able to get the Nurtec yesterday for $4 at the pharmacy.

## 2019-08-04 ENCOUNTER — Other Ambulatory Visit: Payer: Self-pay

## 2019-08-04 ENCOUNTER — Ambulatory Visit (INDEPENDENT_AMBULATORY_CARE_PROVIDER_SITE_OTHER): Payer: Medicare Other | Admitting: Family Medicine

## 2019-08-04 ENCOUNTER — Encounter: Payer: Self-pay | Admitting: Family Medicine

## 2019-08-04 VITALS — BP 124/70 | HR 87 | Temp 97.5°F | Ht 70.0 in | Wt 350.2 lb

## 2019-08-04 DIAGNOSIS — S40862A Insect bite (nonvenomous) of left upper arm, initial encounter: Secondary | ICD-10-CM | POA: Diagnosis not present

## 2019-08-04 DIAGNOSIS — G43809 Other migraine, not intractable, without status migrainosus: Secondary | ICD-10-CM

## 2019-08-04 DIAGNOSIS — W57XXXA Bitten or stung by nonvenomous insect and other nonvenomous arthropods, initial encounter: Secondary | ICD-10-CM

## 2019-08-04 DIAGNOSIS — I10 Essential (primary) hypertension: Secondary | ICD-10-CM | POA: Diagnosis not present

## 2019-08-04 DIAGNOSIS — M25562 Pain in left knee: Secondary | ICD-10-CM

## 2019-08-04 DIAGNOSIS — G8929 Other chronic pain: Secondary | ICD-10-CM

## 2019-08-04 MED ORDER — TOPIRAMATE 50 MG PO TABS: 50.0000 mg | ORAL_TABLET | Freq: Two times a day (BID) | ORAL | 2 refills | Status: DC

## 2019-08-04 NOTE — Patient Instructions (Addendum)
MyChart Username: [email or web address]  Password: [email or web address]   Increase Topamax from 50 mg once daily to 50 mg in the AM and 25 mg in the PM x1 week, then increase to 50 mg twice daily.

## 2019-08-04 NOTE — Progress Notes (Addendum)
Assessment & Plan:  1. Other migraine without status migrainosus, not intractable - Improving.  I have asked patient to send me a copy of the letter she has at home from her insurance company regarding the medications they want her to fail before they will cover Nurtec.  Topamax increased from 50 mg once daily to 50 mg twice daily.  I did advise that she first increase to 50 mg in the morning and 25 mg in the evening for the first week, then she could go up to 50 mg twice daily. - topiramate (TOPAMAX) 50 MG tablet; Take 1 tablet (50 mg total) by mouth 2 (two) times daily.  Dispense: 60 tablet; Refill: 2 - Ambulatory referral to Neurology  2. Chronic pain of left knee - Patient to keep appointment with orthopedic on the 16th.  I did refill her oxycodone today since she will be out before her appointment. - Oxycodone HCl 10 MG TABS; Take 1 tablet (10 mg total) by mouth 2 (two) times daily as needed.  Dispense: 60 tablet; Refill: 0  3. Essential hypertension - Well controlled on current regimen.   4. Tick bite of left upper arm, initial encounter - Reminded patient about her lab work and advise she can have this done at any time.   Return in about 4 weeks (around 09/01/2019) for migraines.  Hendricks Limes, MSN, APRN, FNP-C [facility]  Subjective:    Patient ID: Vanessa [name], female    DOB: 1976/01/03, 44 y.o.   MRN: [id number]  Patient Care Team: Loman Brooklyn, FNP as PCP - General (Family Medicine)   Chief Complaint:  Chief Complaint  Patient presents with  . Migraine    4 week follow up- Patient states that they are the same and she went to [place] x 2 weeks ago bc the pain.    HPI: Vanessa [name] is a 44 y.o. female presenting on 08/04/2019 for Migraine (4 week follow up- Patient states that they are the same and she went to [facility] x 2 weeks ago bc the pain.)  Patient is following up on her migraines.  She was given Nurtec  for abortive treatment which does work, but she reports her insurance is not going to cover it.  She reports she has a letter at home from [employer] company telling her about other medications she needs to try and fail before they will cover the Nurtec.  I am not sure which medications are recommended in this letter, but upon chart review I can tell she has failed abortive treatment with diclofenac, Amerge, and Maxalt.  Currently, because she is afraid she is going to run out of the Nurtec and not be able to get any more, she is only using it when the migraines get severe.  She did have to go to the ER 2 weeks ago because the pain became so severe.  Also at our last visit Topamax was increased from 25 mg to 50 mg once daily.  She does report the amount of migraine days per month did decrease with the increased dosage.  She was advised at her last appointment to schedule a follow-up with her prior neurologist, but she has been unable to do so as his appointments are so far out.  She is interested in a referral to a new neurologist. .  Last month patient was referred to orthopedics for knee pain.  She reports she has an appointment coming up on the 16th.  Also last month future labs were placed due to a tick bite.  Patient reports she never came back and got the labs, and she does not have time today.  New complaints: None  Social history:  Relevant past medical, surgical, family and social history reviewed and updated as indicated. Interim medical history since our last visit reviewed.  Allergies and medications reviewed and updated.  DATA REVIEWED: CHART IN EPIC  ROS: Negative unless specifically indicated above in HPI.    Current Outpatient Medications:  .  acetaZOLAMIDE (DIAMOX) 250 MG tablet, Take 2 tablets (500 mg total) by mouth 2 (two) times daily., Disp: 120 tablet, Rfl: 10 .  allopurinol (ZYLOPRIM) 300 MG tablet, Take 1 tablet (300 mg total) by mouth daily., Disp: 30 tablet, Rfl:  6 .  amitriptyline (ELAVIL) 50 MG tablet, Take 1 tablet (50 mg total) by mouth daily., Disp: 30 tablet, Rfl: 10 .  atorvastatin (LIPITOR) 40 MG tablet, Take 1 tablet (40 mg total) by mouth daily., Disp: 30 tablet, Rfl: 2 .  cyclobenzaprine (FLEXERIL) 10 MG tablet, Take 1 tablet (10 mg total) by mouth 3 (three) times daily as needed for muscle spasms., Disp: 60 tablet, Rfl: 0 .  doxycycline (VIBRA-TABS) 100 MG tablet, Take 1 tablet (100 mg total) by mouth 2 (two) times daily. 1 po bid, Disp: 60 tablet, Rfl: 5 .  FLUoxetine (PROZAC) 20 MG capsule, Take 1 capsule by mouth every evening., Disp: 30 capsule, Rfl: 11 .  gabapentin (NEURONTIN) 300 MG capsule, Take 300 mg by mouth 2 (two) times daily., Disp: , Rfl:  .  glucose blood (ONE TOUCH ULTRA TEST) test strip, Test sugar daily Dx E11.9, Disp: 100 each, Rfl: 3 .  halobetasol (ULTRAVATE) 0.05 % cream, Apply topically 2 (two) times daily., Disp: 50 g, Rfl: 5 .  lisinopril (ZESTRIL) 40 MG tablet, Take 1 tablet (40 mg total) by mouth daily., Disp: 90 tablet, Rfl: 3 .  loratadine (CLARITIN) 10 MG tablet, Take 1 tablet (10 mg total) by mouth daily., Disp: 90 tablet, Rfl: 1 .  montelukast (SINGULAIR) 10 MG tablet, Take 1 tablet (10 mg total) by mouth at bedtime., Disp: 90 tablet, Rfl: 1 .  naratriptan (AMERGE) 2.5 MG tablet, Take 1 tablet (2.5 mg total) by mouth as needed for migraine. Take one (1) tablet at onset of headache; if returns or does not resolve, may repeat after 4 hours; do not exceed five (5) mg in 24 hours., Disp: 10 tablet, Rfl: 5 .  OneTouch Delica Lancets 32D MISC, 1 each by Does not apply route daily. Dx E11.9, Disp: 100 each, Rfl: 3 .  Oxycodone HCl 10 MG TABS, Take 1 tablet (10 mg total) by mouth 4 (four) times daily as needed (pain/headache)., Disp: 120 tablet, Rfl: 0 .  Oxycodone HCl 10 MG TABS, Take 1 tablet (10 mg total) by mouth 2 (two) times daily as needed., Disp: 60 tablet, Rfl: 0 .  Rimegepant Sulfate (NURTEC) 75 MG TBDP, Take 75  mg by mouth daily as needed., Disp: 10 tablet, Rfl: 2 .  Semaglutide,0.25 or 0.5MG /DOS, (OZEMPIC, 0.25 OR 0.5 MG/DOSE,) 2 MG/1.5ML SOPN, Inject 0.5 mg into the skin once a week., Disp: 4 pen, Rfl: 3 .  spironolactone (ALDACTONE) 50 MG tablet, Take 1 tablet (50 mg total) by mouth daily., Disp: 90 tablet, Rfl: 1 .  topiramate (TOPAMAX) 50 MG tablet, Take 1 tablet (50 mg total) by mouth 2 (two) times daily., Disp: 60 tablet, Rfl: 2   Allergies  Allergen Reactions  .  Metrizamide Other (See Comments), Itching, Photosensitivity, Shortness Of Breath and Swelling    update Migraine instantly  . Mushroom Extract Complex Itching    Throat itching with cough  . Peanut Oil Anaphylaxis and Other (See Comments)    update  . Peanut-Containing Drug Products Itching    Itching throat with cough  . Diflucan In Dextrose [Fluconazole In Dextrose] Hives  . Iodine-131 Other (See Comments)    update   Past Medical History:  Diagnosis Date  . Hypertension   . Migraines   . Pseudotumor (inflammatory) of orbit   . Sepsis ([place]) 02/2019  . Vision loss     Past Surgical History:  Procedure Laterality Date  . CSF SHUNT      Social History   Socioeconomic History  . Marital status: Single    Spouse name: Not on file  . Number of children: Not on file  . Years of education: Not on file  . Highest education level: Not on file  Occupational History  . Not on file  Tobacco Use  . Smoking status: Never Smoker  . Smokeless tobacco: Never Used  Vaping Use  . Vaping Use: Never used  Substance and Sexual Activity  . Alcohol use: No  . Drug use: No  . Sexual activity: Not on file  Other Topics Concern  . Not on file  Social History Narrative  . Not on file   Social Determinants of Health   Financial Resource Strain:   . Difficulty of Paying Living Expenses:   Food Insecurity:   . Worried About Charity fundraiser in the Last Year:   . Arboriculturist in the Last Year:   Transportation Needs:    . Film/video editor (Medical):   Marland Kitchen Lack of Transportation (Non-Medical):   Physical Activity:   . Days of Exercise per Week:   . Minutes of Exercise per Session:   Stress:   . Feeling of Stress :   Social Connections:   . Frequency of Communication with Friends and Family:   . Frequency of Social Gatherings with Friends and Family:   . Attends Religious Services:   . Active Member of Clubs or Organizations:   . Attends Archivist Meetings:   Marland Kitchen Marital Status:   Intimate Partner Violence:   . Fear of Current or Ex-Partner:   . Emotionally Abused:   Marland Kitchen Physically Abused:   . Sexually Abused:         Objective:    BP 124/70   Pulse 87   Temp (!) 97.5 F (36.4 C) (Temporal)   Ht 5\' 10"  (1.778 m)   Wt (!) 350 lb 3.2 oz (158.8 kg)   SpO2 94%   BMI 50.25 kg/m   Wt Readings from Last 3 Encounters:  08/04/19 (!) 350 lb 3.2 oz (158.8 kg)  07/06/19 (!) 351 lb 9.6 oz (159.5 kg)  04/30/19 (!) 333 lb 12.8 oz (151.4 kg)    Physical Exam Vitals reviewed.  Constitutional:      General: She is not in acute distress.    Appearance: Normal appearance. She is morbidly obese. She is not ill-appearing, toxic-appearing or diaphoretic.  HENT:     Head: Normocephalic and atraumatic.  Eyes:     General: No scleral icterus.       Right eye: No discharge.        Left eye: No discharge.     Conjunctiva/sclera: Conjunctivae normal.  Cardiovascular:     Rate  and Rhythm: Normal rate and regular rhythm.     Heart sounds: Normal heart sounds. No murmur heard.  No friction rub. No gallop.   Pulmonary:     Effort: Pulmonary effort is normal. No respiratory distress.     Breath sounds: Normal breath sounds. No stridor. No wheezing, rhonchi or rales.  Musculoskeletal:        General: Normal range of motion.     Cervical back: Normal range of motion.  Skin:    General: Skin is warm and dry.     Capillary Refill: Capillary refill takes less than 2 seconds.  Neurological:      General: No focal deficit present.     Mental Status: She is alert and oriented to person, place, and time. Mental status is at baseline.  Psychiatric:        Mood and Affect: Mood normal.        Behavior: Behavior normal.        Thought Content: Thought content normal.        Judgment: Judgment normal.     No results found for: TSH Lab Results  Component Value Date   WBC 5.4 04/07/2019   HGB 12.2 04/07/2019   HCT 39.3 04/07/2019   MCV 84 04/07/2019   PLT 212 04/07/2019   Lab Results  Component Value Date   NA 140 07/06/2019   K 4.2 07/06/2019   CO2 27 07/06/2019   GLUCOSE 99 07/06/2019   BUN 7 07/06/2019   CREATININE 0.79 07/06/2019   BILITOT 0.2 07/06/2019   ALKPHOS 51 07/06/2019   AST 23 07/06/2019   ALT 24 07/06/2019   PROT 7.6 07/06/2019   ALBUMIN 4.1 07/06/2019   CALCIUM 9.5 07/06/2019   Lab Results  Component Value Date   CHOL 197 07/06/2019   Lab Results  Component Value Date   HDL 39 (L) 07/06/2019   Lab Results  Component Value Date   LDLCALC 132 (H) 07/06/2019   Lab Results  Component Value Date   TRIG 143 07/06/2019   Lab Results  Component Value Date   CHOLHDL 5.1 (H) 07/06/2019   Lab Results  Component Value Date   HGBA1C 6.9 07/06/2019

## 2019-08-07 ENCOUNTER — Encounter: Payer: Self-pay | Admitting: Family Medicine

## 2019-08-07 MED ORDER — OXYCODONE HCL 10 MG PO TABS: 10.0000 mg | ORAL_TABLET | Freq: Two times a day (BID) | ORAL | 0 refills | Status: DC | PRN

## 2019-08-09 ENCOUNTER — Other Ambulatory Visit: Payer: Self-pay | Admitting: Family Medicine

## 2019-08-09 DIAGNOSIS — M542 Cervicalgia: Secondary | ICD-10-CM

## 2019-08-10 MED ORDER — CYCLOBENZAPRINE HCL 10 MG PO TABS: 10.0000 mg | ORAL_TABLET | Freq: Three times a day (TID) | ORAL | 0 refills | Status: DC | PRN

## 2019-08-11 ENCOUNTER — Encounter: Payer: Self-pay | Admitting: Orthopaedic Surgery

## 2019-08-11 ENCOUNTER — Other Ambulatory Visit: Payer: Self-pay

## 2019-08-11 ENCOUNTER — Ambulatory Visit: Payer: Self-pay

## 2019-08-11 ENCOUNTER — Ambulatory Visit (INDEPENDENT_AMBULATORY_CARE_PROVIDER_SITE_OTHER): Payer: Medicare Other | Admitting: Orthopaedic Surgery

## 2019-08-11 VITALS — Ht 70.0 in | Wt 340.0 lb

## 2019-08-11 DIAGNOSIS — M170 Bilateral primary osteoarthritis of knee: Secondary | ICD-10-CM | POA: Diagnosis not present

## 2019-08-11 DIAGNOSIS — M25562 Pain in left knee: Secondary | ICD-10-CM

## 2019-08-11 DIAGNOSIS — M2142 Flat foot [pes planus] (acquired), left foot: Secondary | ICD-10-CM

## 2019-08-11 DIAGNOSIS — M2141 Flat foot [pes planus] (acquired), right foot: Secondary | ICD-10-CM | POA: Diagnosis not present

## 2019-08-11 DIAGNOSIS — G8929 Other chronic pain: Secondary | ICD-10-CM

## 2019-08-11 DIAGNOSIS — Z6841 Body Mass Index (BMI) 40.0 and over, adult: Secondary | ICD-10-CM

## 2019-08-11 MED ORDER — METHYLPREDNISOLONE ACETATE 40 MG/ML IJ SUSP
80.0000 mg | INTRAMUSCULAR | Status: AC | PRN
  Administered 2019-08-11: 80 mg via INTRA_ARTICULAR

## 2019-08-11 MED ORDER — BUPIVACAINE HCL 0.5 % IJ SOLN
2.0000 mL | INTRAMUSCULAR | Status: AC | PRN
  Administered 2019-08-11: 2 mL via INTRA_ARTICULAR

## 2019-08-11 MED ORDER — LIDOCAINE HCL 1 % IJ SOLN
2.0000 mL | INTRAMUSCULAR | Status: AC | PRN
  Administered 2019-08-11: 2 mL

## 2019-08-11 NOTE — Progress Notes (Signed)
Office Visit Note   Patient: Vanessa [name]           Date of Birth: [birth year]           MRN: [id number] Visit Date: 08/11/2019              Requested by: Loman Brooklyn, [place],  [place] [id number] PCP: Loman Brooklyn, FNP   Assessment & Plan: Visit Diagnoses:  1. Chronic pain of left knee   2. Bilateral primary osteoarthritis of knee   3. Bilateral pes planus   4. Morbid obesity with BMI of 50.0-59.9, adult [facility])     Plan: Mrs. Stice has advanced osteoarthritis in both of her knees by plain films.  Long discussion over 30 minutes regarding the the above.  I am sure that her weight has had an impact on both of her knees at such a young age.  She is working on that having lost about 30 pounds recently.  At one point she weighed 375 pounds and her goal is to lose at least 125 pounds.  Her present BMI is about 49.  I will inject her knee with cortisone and monitor response.  She is fully aware that treatment will be temporary.  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  Will need arch supports that she could purchase at one of the local pharmacies.  I do not think that the full arch supports that we have in the office will be supportive enough. Further discussion about weight loss and what potential impact it would have on her left ankle and left knee.  We will monitor her response to the above and then have her return.  Consider MRI scan of left ankle if not much improvement with arch supports.  Could consider custom arch supports in the future Follow-Up Instructions: Return if symptoms worsen or fail to improve.   Orders:  Orders Placed This Encounter  Procedures  . Large Joint Inj: L knee  . XR KNEE 3 VIEW LEFT   No orders of the defined types were placed in this encounter.     Procedures: Large Joint Inj: L knee on 08/11/2019 11:34 AM Indications: pain and diagnostic evaluation Details: 25 G 1.5 in needle,  anteromedial approach  Arthrogram: No  Medications: 2 mL lidocaine 1 %; 2 mL bupivacaine 0.5 %; 80 mg methylPREDNISolone acetate 40 MG/ML Procedure, treatment alternatives, risks and benefits explained, specific risks discussed. Consent was given by the patient. Patient was prepped and draped in the usual sterile fashion.       Clinical Data: No additional findings.   Subjective: Chief Complaint  Patient presents with  . Left Knee - Pain  Patient presents today for left knee pain. She states that the pain started in November of 2020, after a stay in [facility]. She was hospitalized for cellulitis and sepsis in her right leg. When she left the hospital she was having to bear more weight on the left leg, which is when her pain started. She feels like her left ankle rolls outward and she has more wear on the lateral side of her shoes. Her left knee pain is all throughout, and worse upon sitting and standing. She takes oxycodone as prescribed by her PCP Dr.Joyce. She also states that her knee swells. No grinding.  HPI  Review of Systems   Objective: Vital Signs: Ht 5\' 10"  (1.778 m)   Wt (!) 340 lb (154.2 kg)  BMI 48.78 kg/m   Physical Exam Constitutional:      Appearance: She is well-developed.  Eyes:     Pupils: Pupils are equal, round, and reactive to light.  Pulmonary:     Effort: Pulmonary effort is normal.  Skin:    General: Skin is warm and dry.  Neurological:     Mental Status: She is alert and oriented to person, place, and time.  Psychiatric:        Behavior: Behavior normal.     Ortho Exam awake alert and oriented x3.  Comfortable sitting.  Large knees.  Predominant medial joint pain left knee.  Difficult to determine if there is an effusion.  Some patellar crepitation and minimal lateral joint pain.  No popliteal mass.  Some venous stasis changes distally.  No calf pain.  Pain behind the medial malleolus left ankle with significant pes planus.  Good  capillary refill to toes.  No pain around the Achilles or lateral ankle.  No ankle instability.  Posterior tibial tendon appears to be functional. Specialty Comments:  No specialty comments available.  Imaging: XR KNEE 3 VIEW LEFT  Result Date: 08/11/2019 Films of the left knee were obtained in 3 projections standing.  There is significant decrease in the medial joint space with slight varus.  There are degenerative changes in all 3 compartments and but tickly the patellofemoral joint where there is lateral facet narrowing, subchondral sclerosis and peripheral osteophytes.  No ectopic calcification.  Films are consistent with advanced osteoarthritis    PMFS History: Patient Active Problem List   Diagnosis Date Noted  . Bilateral primary osteoarthritis of knee 08/11/2019  . Bilateral pes planus 08/11/2019  . Hyperlipidemia associated with type 2 diabetes mellitus ([place]) 07/05/2019  . Diabetes mellitus without complication ([place]) 57/47/3403  . Migraine 01/17/2018  . Other complicated headache syndrome 09/30/2016  . Metabolic syndrome 70/96/4383  . Essential hypertension 03/04/2016  . PCOS (polycystic ovarian syndrome) 03/04/2016  . Morbid obesity with BMI of 50.0-59.9, adult ([place]) 11/21/2015  . 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 IIH (idiopathic intracranial hypertension) 09/21/2015  . Blindness of right eye 08/14/2015  . Morbid obesity ([place]) 08/14/2015  . 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   Past Medical History:  Diagnosis Date  . Hypertension   . Migraines   . Pseudotumor (inflammatory) of orbit   . Sepsis ([place]) 02/2019  . Vision loss     Family History  Problem Relation Age of Onset  . Pulmonary Hypertension Mother   . Congestive Heart Failure Mother   . Hypertension Sister     Past Surgical History:  Procedure Laterality Date  . CSF SHUNT     Social History   Occupational History  . Not on file  Tobacco Use  . Smoking status: Never Smoker  . Smokeless tobacco: Never Used   Vaping Use  . Vaping Use: Never used  Substance and Sexual Activity  . Alcohol use: No  . Drug use: No  . Sexual activity: Not on file

## 2019-09-01 ENCOUNTER — Ambulatory Visit: Payer: Medicare Other | Admitting: Family Medicine

## 2019-09-07 ENCOUNTER — Other Ambulatory Visit: Payer: Self-pay | Admitting: *Deleted

## 2019-09-07 DIAGNOSIS — E1169 Type 2 diabetes mellitus with other specified complication: Secondary | ICD-10-CM

## 2019-09-07 MED ORDER — ATORVASTATIN CALCIUM 40 MG PO TABS: 40.0000 mg | ORAL_TABLET | Freq: Every day | ORAL | 3 refills | Status: DC

## 2019-09-16 ENCOUNTER — Telehealth: Payer: Self-pay | Admitting: *Deleted

## 2019-09-16 ENCOUNTER — Other Ambulatory Visit: Payer: Self-pay | Admitting: Family Medicine

## 2019-09-16 DIAGNOSIS — M542 Cervicalgia: Secondary | ICD-10-CM

## 2019-09-16 NOTE — Telephone Encounter (Addendum)
PA came in for pt NURTEC Key: [id number] Sent to plan

## 2019-09-16 NOTE — Telephone Encounter (Signed)
Last OV 08/04/19. Last RF 08/10/19. Next OV 09/23/19

## 2019-09-16 NOTE — Telephone Encounter (Signed)
NURTEC TAB 75MG  ODT is approved through 02/25/2020.   [facility] was unable to take call for VM and speak to tech.  I called pt and made her aware

## 2019-09-23 ENCOUNTER — Other Ambulatory Visit: Payer: Self-pay

## 2019-09-23 ENCOUNTER — Encounter: Payer: Self-pay | Admitting: Family Medicine

## 2019-09-23 ENCOUNTER — Ambulatory Visit (INDEPENDENT_AMBULATORY_CARE_PROVIDER_SITE_OTHER): Payer: Medicare Other | Admitting: Family Medicine

## 2019-09-23 VITALS — BP 159/87 | HR 89 | Temp 97.3°F | Ht 70.0 in | Wt 356.6 lb

## 2019-09-23 DIAGNOSIS — G43819 Other migraine, intractable, without status migrainosus: Secondary | ICD-10-CM

## 2019-09-23 DIAGNOSIS — D229 Melanocytic nevi, unspecified: Secondary | ICD-10-CM | POA: Diagnosis not present

## 2019-09-23 DIAGNOSIS — M25562 Pain in left knee: Secondary | ICD-10-CM | POA: Diagnosis not present

## 2019-09-23 DIAGNOSIS — G8929 Other chronic pain: Secondary | ICD-10-CM

## 2019-09-23 MED ORDER — OXYCODONE HCL 10 MG PO TABS: 10.0000 mg | ORAL_TABLET | Freq: Two times a day (BID) | ORAL | 0 refills | Status: DC | PRN

## 2019-09-23 NOTE — Progress Notes (Signed)
Assessment & Plan:  1. Other migraine without status migrainosus, intractable - No further changes in therapy until patient is seen by neurology per her request. Appointment scheduled for 10/05/2019.   2. Chronic pain of left knee - Controlled with Oxycodone. Patient will continue to see ortho for injections and further management. She is working on weight loss to help with the pain. Controlled substance agreement in place. UDS May 2021 as expected. PDMP reviewed with no concerning findings.  - Oxycodone HCl 10 MG TABS; Take 1 tablet (10 mg total) by mouth 2 (two) times daily as needed.  Dispense: 60 tablet; Refill: 0 - Oxycodone HCl 10 MG TABS; Take 1 tablet (10 mg total) by mouth 2 (two) times daily as needed.  Dispense: 60 tablet; Refill: 0 - Oxycodone HCl 10 MG TABS; Take 1 tablet (10 mg total) by mouth 2 (two) times daily as needed.  Dispense: 60 tablet; Refill: 0  3. Change in mole - Ambulatory referral to Dermatology  4. Morbid obesity ([place]) - Ambulatory referral to Nutrition and Diabetic Education   Return in about 3 months (around 12/24/2019) for follow-up of chronic medication conditions.  Hendricks Limes, MSN, APRN, FNP-C [facility]  Subjective:    Patient ID: Vanessa [name], female    DOB: [birth year], 44 y.o.   MRN: [id number]  Patient Care Team: Loman Brooklyn, FNP as PCP - General (Family Medicine)   Chief Complaint:  Chief Complaint  Patient presents with  . Migraine    4 week re check - patient states that they have improved.    HPI: Vanessa [name] is a 44 y.o. female presenting on 09/23/2019 for Migraine (4 week re check - patient states that they have improved.)  Patient is here for a 4-week follow-up of her migraines.  At her last visit Topamax was increased from 50 mg once daily to 50 mg twice daily.  She reports today that the increase in Topamax has decreased her migraines over the past month.  She does have a prescription for  Nurtec which she has had to use twice in the past week, but states she has only had to use 4-5 in the past month, which is a significant improvement from previous abortive therapy use.  Patient was previously using oxycodone for her migraines as well as knee pain.  She reports she is no longer using the oxycodone for migraines at all since she has the Nurtec and she tries not to use it for her knee or ankle pain as she finds it causes her to have headaches.  She does require the oxycodone on days that she does a lot of walking.  She is seeing an orthopedic now and has received a cortisone shot in the left knee which has been effective so far.  New complaints: Patient is requesting a referral to [facility] in [place] due to two moles she is concerned about.  One is on her back and her bra irritates it.  The other is on the right side of her face.  Patient is also requesting a referral to a nutritionist at [facility] or in [place] as she is trying very hard to lose some weight to further help her pain.   Social history:  Relevant past medical, surgical, family and social history reviewed and updated as indicated. Interim medical history since our last visit reviewed.  Allergies and medications reviewed and updated.  DATA REVIEWED: CHART IN EPIC  ROS: Negative unless specifically  indicated above in HPI.    Current Outpatient Medications:  .  acetaZOLAMIDE (DIAMOX) 250 MG tablet, Take 2 tablets (500 mg total) by mouth 2 (two) times daily., Disp: 120 tablet, Rfl: 10 .  allopurinol (ZYLOPRIM) 300 MG tablet, Take 1 tablet (300 mg total) by mouth daily., Disp: 30 tablet, Rfl: 6 .  amitriptyline (ELAVIL) 50 MG tablet, Take 1 tablet (50 mg total) by mouth daily., Disp: 30 tablet, Rfl: 10 .  atorvastatin (LIPITOR) 40 MG tablet, Take 1 tablet (40 mg total) by mouth daily., Disp: 30 tablet, Rfl: 3 .  cyclobenzaprine (FLEXERIL) 10 MG tablet, TAKE 1 TABLET BY MOUTH  THREE TIMES A DAY AS NEEDED FOR MUSCLE SPASMS, Disp: 60 tablet, Rfl: 2 .  doxycycline (VIBRA-TABS) 100 MG tablet, Take 1 tablet (100 mg total) by mouth 2 (two) times daily. 1 po bid, Disp: 60 tablet, Rfl: 5 .  FLUoxetine (PROZAC) 20 MG capsule, Take 1 capsule by mouth every evening., Disp: 30 capsule, Rfl: 11 .  gabapentin (NEURONTIN) 300 MG capsule, Take 300 mg by mouth 2 (two) times daily., Disp: , Rfl:  .  glucose blood (ONE TOUCH ULTRA TEST) test strip, Test sugar daily Dx E11.9, Disp: 100 each, Rfl: 3 .  halobetasol (ULTRAVATE) 0.05 % cream, Apply topically 2 (two) times daily., Disp: 50 g, Rfl: 5 .  lisinopril (ZESTRIL) 40 MG tablet, Take 1 tablet (40 mg total) by mouth daily., Disp: 90 tablet, Rfl: 3 .  loratadine (CLARITIN) 10 MG tablet, Take 1 tablet (10 mg total) by mouth daily., Disp: 90 tablet, Rfl: 1 .  montelukast (SINGULAIR) 10 MG tablet, Take 1 tablet (10 mg total) by mouth at bedtime., Disp: 90 tablet, Rfl: 1 .  naratriptan (AMERGE) 2.5 MG tablet, Take 1 tablet (2.5 mg total) by mouth as needed for migraine. Take one (1) tablet at onset of headache; if returns or does not resolve, may repeat after 4 hours; do not exceed five (5) mg in 24 hours., Disp: 10 tablet, Rfl: 5 .  OneTouch Delica Lancets 81X MISC, 1 each by Does not apply route daily. Dx E11.9, Disp: 100 each, Rfl: 3 .  Oxycodone HCl 10 MG TABS, Take 1 tablet (10 mg total) by mouth 2 (two) times daily as needed., Disp: 60 tablet, Rfl: 0 .  Rimegepant Sulfate (NURTEC) 75 MG TBDP, Take 75 mg by mouth daily as needed., Disp: 10 tablet, Rfl: 2 .  Semaglutide,0.25 or 0.5MG /DOS, (OZEMPIC, 0.25 OR 0.5 MG/DOSE,) 2 MG/1.5ML SOPN, Inject 0.5 mg into the skin once a week., Disp: 4 pen, Rfl: 3 .  spironolactone (ALDACTONE) 50 MG tablet, Take 1 tablet (50 mg total) by mouth daily., Disp: 90 tablet, Rfl: 1 .  topiramate (TOPAMAX) 50 MG tablet, Take 1 tablet (50 mg total) by mouth 2 (two) times daily., Disp: 60 tablet, Rfl: 2   Allergies   Allergen Reactions  . Metrizamide Other (See Comments), Itching, Photosensitivity, Shortness Of Breath and Swelling    update Migraine instantly  . Mushroom Extract Complex Itching    Throat itching with cough  . Peanut Oil Anaphylaxis and Other (See Comments)    update  . Peanut-Containing Drug Products Itching    Itching throat with cough  . Diflucan In Dextrose [Fluconazole In Dextrose] Hives  . Iodine-131 Other (See Comments)    update   Past Medical History:  Diagnosis Date  . Hypertension   . Migraines   . Pseudotumor (inflammatory) of orbit   . Sepsis ([place]) 02/2019  .  Vision loss     Past Surgical History:  Procedure Laterality Date  . CSF SHUNT      Social History   Socioeconomic History  . Marital status: Single    Spouse name: Not on file  . Number of children: Not on file  . Years of education: Not on file  . Highest education level: Not on file  Occupational History  . Not on file  Tobacco Use  . Smoking status: Never Smoker  . Smokeless tobacco: Never Used  Vaping Use  . Vaping Use: Never used  Substance and Sexual Activity  . Alcohol use: No  . Drug use: No  . Sexual activity: Not on file  Other Topics Concern  . Not on file  Social History Narrative  . Not on file   Social Determinants of Health   Financial Resource Strain:   . Difficulty of Paying Living Expenses:   Food Insecurity:   . Worried About Charity fundraiser in the Last Year:   . Arboriculturist in the Last Year:   Transportation Needs:   . Film/video editor (Medical):   Marland Kitchen Lack of Transportation (Non-Medical):   Physical Activity:   . Days of Exercise per Week:   . Minutes of Exercise per Session:   Stress:   . Feeling of Stress :   Social Connections:   . Frequency of Communication with Friends and Family:   . Frequency of Social Gatherings with Friends and Family:   . Attends Religious Services:   . Active Member of Clubs or Organizations:   . Attends Theatre manager Meetings:   Marland Kitchen Marital Status:   Intimate Partner Violence:   . Fear of Current or Ex-Partner:   . Emotionally Abused:   Marland Kitchen Physically Abused:   . Sexually Abused:         Objective:    BP (!) 155/88   Pulse 89   Temp (!) 97.3 F (36.3 C) (Temporal)   Ht 5\' 10"  (1.778 m)   Wt (!) 356 lb 9.6 oz (161.8 kg)   SpO2 100%   BMI 51.17 kg/m   Wt Readings from Last 3 Encounters:  09/23/19 (!) 356 lb 9.6 oz (161.8 kg)  08/11/19 (!) 340 lb (154.2 kg)  08/04/19 (!) 350 lb 3.2 oz (158.8 kg)    Physical Exam Vitals reviewed.  Constitutional:      General: She is not in acute distress.    Appearance: Normal appearance. She is morbidly obese. She is not ill-appearing, toxic-appearing or diaphoretic.  HENT:     Head: Normocephalic and atraumatic.  Eyes:     General: No scleral icterus.       Right eye: No discharge.        Left eye: No discharge.     Conjunctiva/sclera: Conjunctivae normal.  Cardiovascular:     Rate and Rhythm: Normal rate and regular rhythm.     Heart sounds: Normal heart sounds. No murmur heard.  No friction rub. No gallop.   Pulmonary:     Effort: Pulmonary effort is normal. No respiratory distress.     Breath sounds: Normal breath sounds. No stridor. No wheezing, rhonchi or rales.  Musculoskeletal:        General: Normal range of motion.     Cervical back: Normal range of motion.  Skin:    General: Skin is warm and dry.     Capillary Refill: Capillary refill takes less than 2 seconds.  Neurological:  General: No focal deficit present.     Mental Status: She is alert and oriented to person, place, and time. Mental status is at baseline.  Psychiatric:        Mood and Affect: Mood normal.        Behavior: Behavior normal.        Thought Content: Thought content normal.        Judgment: Judgment normal.     No results found for: TSH Lab Results  Component Value Date   WBC 5.4 04/07/2019   HGB 12.2 04/07/2019   HCT 39.3 04/07/2019    MCV 84 04/07/2019   PLT 212 04/07/2019   Lab Results  Component Value Date   NA 140 07/06/2019   K 4.2 07/06/2019   CO2 27 07/06/2019   GLUCOSE 99 07/06/2019   BUN 7 07/06/2019   CREATININE 0.79 07/06/2019   BILITOT 0.2 07/06/2019   ALKPHOS 51 07/06/2019   AST 23 07/06/2019   ALT 24 07/06/2019   PROT 7.6 07/06/2019   ALBUMIN 4.1 07/06/2019   CALCIUM 9.5 07/06/2019   Lab Results  Component Value Date   CHOL 197 07/06/2019   Lab Results  Component Value Date   HDL 39 (L) 07/06/2019   Lab Results  Component Value Date   LDLCALC 132 (H) 07/06/2019   Lab Results  Component Value Date   TRIG 143 07/06/2019   Lab Results  Component Value Date   CHOLHDL 5.1 (H) 07/06/2019   Lab Results  Component Value Date   HGBA1C 6.9 07/06/2019

## 2019-09-23 NOTE — Patient Instructions (Signed)
[facility] [place] [facility]  [facility])

## 2019-10-04 ENCOUNTER — Other Ambulatory Visit: Payer: Self-pay | Admitting: *Deleted

## 2019-10-04 DIAGNOSIS — G932 Benign intracranial hypertension: Secondary | ICD-10-CM

## 2019-10-04 DIAGNOSIS — G43809 Other migraine, not intractable, without status migrainosus: Secondary | ICD-10-CM

## 2019-10-05 ENCOUNTER — Ambulatory Visit (INDEPENDENT_AMBULATORY_CARE_PROVIDER_SITE_OTHER): Payer: Medicare Other | Admitting: Neurology

## 2019-10-05 ENCOUNTER — Encounter: Payer: Self-pay | Admitting: Neurology

## 2019-10-05 DIAGNOSIS — H547 Unspecified visual loss: Secondary | ICD-10-CM

## 2019-10-05 DIAGNOSIS — G932 Benign intracranial hypertension: Secondary | ICD-10-CM | POA: Diagnosis not present

## 2019-10-05 DIAGNOSIS — Z982 Presence of cerebrospinal fluid drainage device: Secondary | ICD-10-CM

## 2019-10-05 DIAGNOSIS — R51 Headache with orthostatic component, not elsewhere classified: Secondary | ICD-10-CM

## 2019-10-05 DIAGNOSIS — R519 Headache, unspecified: Secondary | ICD-10-CM

## 2019-10-05 DIAGNOSIS — G43709 Chronic migraine without aura, not intractable, without status migrainosus: Secondary | ICD-10-CM | POA: Diagnosis not present

## 2019-10-05 DIAGNOSIS — G060 Intracranial abscess and granuloma: Secondary | ICD-10-CM

## 2019-10-05 MED ORDER — AJOVY 225 MG/1.5ML ~~LOC~~ SOAJ: 225.0000 mg | SUBCUTANEOUS | 3 refills | Status: DC

## 2019-10-05 MED ORDER — AMITRIPTYLINE HCL 50 MG PO TABS: 50.0000 mg | ORAL_TABLET | Freq: Every day | ORAL | 1 refills | Status: DC

## 2019-10-05 MED ORDER — ACETAZOLAMIDE 250 MG PO TABS: 500.0000 mg | ORAL_TABLET | Freq: Two times a day (BID) | ORAL | 1 refills | Status: DC

## 2019-10-05 NOTE — Patient Instructions (Addendum)
[facility]: encouraged compliance healthy diet and exercise MRI of the brain wo contrast (she is severly allergic to contrast) xr shunt series Ajovy monthly Reduce amitriptyline by 1/2 tab and when feeling better on the Ajovy stop amitriptyline.    There is increased risk for stroke in women with migraine with aura and a contraindication for the combined contraceptive pill for use by women who have migraine with aura. The risk for women with migraine without aura is lower. However other risk factors like smoking are far more likely to increase stroke risk than migraine. There is a recommendation for no smoking and for the use of OCPs without estrogen such as progestogen only pills particularly for women with migraine with aura.Marland Kitchen People who have migraine headaches with auras may be 3 times more likely to have a stroke caused by a blood clot, compared to migraine patients who don't see auras. Women who take hormone-replacement therapy may be 30 percent more likely to suffer a clot-based stroke than women not taking medication containing estrogen. Other risk factors like smoking and high blood pressure may be  much more important.  Fremanezumab injection What is this medicine? FREMANEZUMAB (fre ma NEZ ue mab) is used to prevent migraine headaches. This medicine may be used for other purposes; ask your health care provider or pharmacist if you have questions. COMMON BRAND NAME(S): AJOVY What should I tell my health care provider before I take this medicine? They need to know if you have any of these conditions:  an unusual or allergic reaction to fremanezumab, other medicines, foods, dyes, or preservatives  pregnant or trying to get pregnant  breast-feeding How should I use this medicine? This medicine is for injection under the skin. You will be taught how to prepare and give this medicine. Use exactly as directed. Take your medicine at regular intervals. Do not take  your medicine more often than directed. It is important that you put your used needles and syringes in a special sharps container. Do not put them in a trash can. If you do not have a sharps container, call your pharmacist or healthcare provider to get one. Talk to your pediatrician regarding the use of this medicine in children. Special care may be needed. Overdosage: If you think you have taken too much of this medicine contact a poison control center or emergency room at once. NOTE: This medicine is only for you. Do not share this medicine with others. What if I miss a dose? If you miss a dose, take it as soon as you can. If it is almost time for your next dose, take only that dose. Do not take double or extra doses. What may interact with this medicine? Interactions are not expected. This list may not describe all possible interactions. Give your health care provider a list of all the medicines, herbs, non-prescription drugs, or dietary supplements you use. Also tell them if you smoke, drink alcohol, or use illegal drugs. Some items may interact with your medicine. What should I watch for while using this medicine? Tell your doctor or healthcare professional if your symptoms do not start to get better or if they get worse. What side effects may I notice from receiving this medicine? Side effects that you should report to your doctor or health care professional as soon as possible:  allergic reactions like skin rash, itching or hives, swelling of the face, lips, or tongue Side effects that usually do not require medical attention (report  these to your doctor or health care professional if they continue or are bothersome):  pain, redness, or irritation at site where injected This list may not describe all possible side effects. Call your doctor for medical advice about side effects. You may report side effects to FDA at 1-800-FDA-1088. Where should I keep my medicine? Keep out of the reach of  children. You will be instructed on how to store this medicine. Throw away any unused medicine after the expiration date on the label. NOTE: This sheet is a summary. It may not cover all possible information. If you have questions about this medicine, talk to your doctor, pharmacist, or health care provider.  2020 Elsevier/Gold Standard (2016-11-11 17:22:56)

## 2019-10-05 NOTE — Progress Notes (Signed)
[facility]    Provider:  Dr Jaynee Eagles Requesting Provider: Loman Brooklyn, FNP Primary Care Provider:  Loman Brooklyn, FNP  CC:  IDIOPATHIC INTRACRANIAL HYPERTENSION and migraines  HPI:  Vanessa [name] is a 44 y.o. female here as requested by Loman Brooklyn, FNP for migraines.  Patient has a past medical history of IDIOPATHIC INTRACRANIAL HYPERTENSION,  congenital blindness OD, migraines, morbid obesity, hypertension, vision loss, PCOS, diabetes, hyperlipidemia, congenital blindness of right eye, OD glaucoma. 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  She has been evaluated in the past by Dr. Jola Schmidt) as well as Dr. Octavia Bruckner Martin(neuro-Ophthalmology) and now she sees a neuro- ophthalmologist elsewhere.  Last time she saw neurosurgery was in 2019 and at that time the shunt was in good working condition. Headaches improved. She is still getting migraines however. Some days it feels like head is going to explode. She has not lost weight. She has daily headaches and migraines 2x a week. She wakes up with headaches, not using her cpap. Positional worsening headache, vision changes and blurry vision.The migraines are throbbing, she may smell finny things prior to  migraine. Migraines can last 3 days, severe, at least 15 migraine days a month for over a year, light/sound sensitivity, nausea, no vomiting. Daily headaches. Congenital blindness right eye. Headaches are worse in the morning and with position. No weakness, numbness. No other focal neurologic deficits, associated symptoms, inciting events or modifiable factors.  Last seen by neurosurgery in April 2019.  Reviewed notes, labs and imaging from outside physicians, which showed:  Reviewed imaging from January 2019 CT of the head, interval placement of a left frontal approach VP shunt catheter, slitlike appearance of the lateral and third ventricles.  Evidence of a prior right frontal burr hole site.  XR shunt series showed left VP shunt catheter tubing coursing along the left hemithorax and into the abdomen and terminates into the left lower quadrant, no shunt kinking or discontinuity.   From a thorough review of notes patient's been on multiple medications that can be used in iih and migraine management including acetazolamide, propranolol, amitriptyline, amlodipine, Benadryl, gabapentin, lisinopril, Zofran, promethazine,topamax, naratriptan, maxalt, sumatriptan  Review of Systems: Patient complains of symptoms per HPI as well as the following symptoms: Headache, vision loss. Pertinent negatives and positives per HPI. All others negative.   Social History   Socioeconomic History  . Marital status: Single    Spouse name: Not on file  . Number of children: Not on file  . Years of education: Not on file  . Highest education level: Not on file  Occupational History  . Not on file  Tobacco Use  . Smoking status: Never Smoker  . Smokeless tobacco: Never Used  Vaping Use  . Vaping Use: Never used  Substance and Sexual Activity  . Alcohol use: No  .  Drug use: No  . Sexual activity: Not on file  Other Topics Concern  . Not on file  Social History Narrative   Lives with parents   Right handed    Caffeine: maybe 2 cups/day   Social Determinants of Health   Financial Resource Strain:   . Difficulty of Paying Living Expenses:   Food Insecurity:   . Worried About Charity fundraiser in the Last Year:   . Arboriculturist in the Last Year:   Transportation Needs:   . Film/video editor (Medical):   Marland Kitchen Lack of Transportation (Non-Medical):   Physical Activity:   . Days of Exercise per Week:   . Minutes of Exercise per Session:   Stress:   . Feeling of Stress :   Social Connections:   . Frequency of Communication with Friends and Family:   . Frequency of Social Gatherings with Friends and Family:   . Attends Religious Services:   . Active Member of Clubs or Organizations:   . Attends Archivist Meetings:   Marland Kitchen Marital Status:   Intimate Partner Violence:   . Fear of Current or Ex-Partner:   . Emotionally Abused:   Marland Kitchen Physically Abused:   . Sexually Abused:     Family History  Problem Relation Age of Onset  . Pulmonary Hypertension Mother   . Congestive Heart Failure Mother   . Hypertension Sister   . Migraines Neg Hx     Past Medical History:  Diagnosis Date  . Hypertension   . Migraines   . Pseudotumor (inflammatory) of orbit   . Sepsis ([place]) 02/2019  . Vision loss     Patient Active Problem List   Diagnosis Date Noted  . Chronic migraine without aura without status migrainosus, not intractable 10/05/2019  . Bilateral primary osteoarthritis of knee 08/11/2019  . Bilateral pes planus 08/11/2019  . Hyperlipidemia associated with type 2 diabetes mellitus ([place]) 07/05/2019  . Diabetes mellitus without complication ([place]) 78/29/5621  . Migraine 01/17/2018  . Other complicated headache syndrome 09/30/2016  . Metabolic syndrome 30/86/5784  . Essential hypertension 03/04/2016  . PCOS (polycystic ovarian syndrome) 03/04/2016  . Morbid obesity with BMI of 50.0-59.9, adult ([place]) 11/21/2015  . S/P ventriculoperitoneal shunt 10/25/2015  . IIH (idiopathic  intracranial hypertension) 09/21/2015  . Blindness of right eye 08/14/2015  . Morbid obesity ([place]) 08/14/2015  . 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    Past Surgical History:  Procedure Laterality Date  . CSF SHUNT      Current Outpatient Medications  Medication Sig Dispense Refill  . acetaZOLAMIDE (DIAMOX) 250 MG tablet Take 2 tablets (500 mg total) by mouth 2 (two) times daily. 360 tablet 1  . allopurinol (ZYLOPRIM) 300 MG tablet Take 1 tablet (300 mg total) by mouth daily. 30 tablet 6  . amitriptyline (ELAVIL) 50 MG tablet Take 1 tablet (50 mg total) by mouth daily. 90 tablet 1  . atorvastatin (LIPITOR) 40 MG tablet Take 1 tablet (40 mg total) by mouth daily. 30 tablet 3  . cyclobenzaprine (FLEXERIL) 10 MG tablet TAKE 1 TABLET BY MOUTH THREE TIMES A DAY AS NEEDED FOR MUSCLE SPASMS 60 tablet 2  . doxycycline (VIBRA-TABS) 100 MG tablet Take 1 tablet (100 mg total) by mouth 2 (two) times daily. 1 po bid 60 tablet 5  . FLUoxetine (PROZAC) 20 MG capsule Take 1 capsule by mouth every evening. 30 capsule 11  . gabapentin (NEURONTIN) 300 MG capsule Take 300 mg by mouth 2 (two) times  daily.    . glucose blood (ONE TOUCH ULTRA TEST) test strip Test sugar daily Dx E11.9 100 each 3  . halobetasol (ULTRAVATE) 0.05 % cream Apply topically 2 (two) times daily. 50 g 5  . lisinopril (ZESTRIL) 40 MG tablet Take 1 tablet (40 mg total) by mouth daily. 90 tablet 3  . loratadine (CLARITIN) 10 MG tablet Take 1 tablet (10 mg total) by mouth daily. 90 tablet 1  . montelukast (SINGULAIR) 10 MG tablet Take 1 tablet (10 mg total) by mouth at bedtime. 90 tablet 1  . naratriptan (AMERGE) 2.5 MG tablet Take 1 tablet (2.5 mg total) by mouth as needed for migraine. Take one (1) tablet at onset of headache; if returns or does not resolve, may repeat after 4 hours; do not exceed five (5) mg in 24 hours. 10 tablet 5  . OneTouch Delica Lancets 24Q MISC 1 each by Does not apply route daily. Dx E11.9 100 each 3  . Oxycodone HCl  10 MG TABS Take 1 tablet (10 mg total) by mouth 2 (two) times daily as needed. 60 tablet 0  . [START ON 10/23/2019] Oxycodone HCl 10 MG TABS Take 1 tablet (10 mg total) by mouth 2 (two) times daily as needed. 60 tablet 0  . [START ON 11/22/2019] Oxycodone HCl 10 MG TABS Take 1 tablet (10 mg total) by mouth 2 (two) times daily as needed. 60 tablet 0  . Rimegepant Sulfate (NURTEC) 75 MG TBDP Take 75 mg by mouth daily as needed. 10 tablet 2  . Semaglutide,0.25 or 0.5MG /DOS, (OZEMPIC, 0.25 OR 0.5 MG/DOSE,) 2 MG/1.5ML SOPN Inject 0.5 mg into the skin once a week. 4 pen 3  . spironolactone (ALDACTONE) 50 MG tablet Take 1 tablet (50 mg total) by mouth daily. 90 tablet 1  . topiramate (TOPAMAX) 50 MG tablet Take 1 tablet (50 mg total) by mouth 2 (two) times daily. 60 tablet 2  . Fremanezumab-vfrm (AJOVY) 225 MG/1.5ML SOAJ Inject 225 mg into the skin every 30 (thirty) days. 90 day supply. Patient has copay card; she can have medication for $5 regardless of insurance approval or copay amount. 4.5 mL 3   No current facility-administered medications for this visit.    Allergies as of 10/05/2019 - Review Complete 10/05/2019  Allergen Reaction Noted  . Metrizamide Other (See Comments), Itching, Photosensitivity, Shortness Of Breath, and Swelling 10/03/2014  . Mushroom extract complex Itching 12/18/2016  . Peanut oil Anaphylaxis and Other (See Comments) 01/18/2015  . Peanut-containing drug products Itching 12/18/2016  . Diflucan in dextrose [fluconazole in dextrose] Hives 06/03/2017  . Iodine-131 Other (See Comments) 01/18/2015    Vitals: BP (!) 142/85 (BP Location: Right Arm, Patient Position: Sitting, Cuff Size: Large)   Pulse 94   Ht 5\' 10"  (1.778 m)   Wt (!) 361 lb (163.7 kg)   BMI 51.80 kg/m  Last Weight:  Wt Readings from Last 1 Encounters:  10/05/19 (!) 361 lb (163.7 kg)   Last Height:   Ht Readings from Last 1 Encounters:  10/05/19 5\' 10"  (1.778 m)     Physical exam: Exam: Gen: NAD,  conversant, well nourised, morbidly obese, well groomed                     CV: RRR, no MRG. No Carotid Bruits. No peripheral edema, warm, nontender Eyes: Conjunctivae clear without exudates or hemorrhage  Neuro: Detailed Neurologic Exam  Speech:    Speech is normal; fluent and spontaneous with normal comprehension.  Cognition:    The patient is oriented to person, place, and time;     recent and remote memory intact;     language fluent;     normal attention, concentration,     fund of knowledge Cranial Nerves:    The pupils are equal, round, and reactive to light. Attempted fundoscopy could not visualize. Right eye blind with exotropia third nerve palsy right. Left eye visual fields are full to finger confrontation. Extraocular movements are intact left. Trigeminal sensation is intact and the muscles of mastication are normal. The face is symmetric. The palate elevates in the midline. Hearing intact. Voice is normal. Shoulder shrug is normal. The tongue has normal motion without fasciculations.   Coordination:    No ataxia or dysmetria  Gait:    Wide based due to large body habitus  Motor Observation:    No asymmetry, no atrophy, and no involuntary movements noted. Tone:    Normal muscle tone.    Posture:    Posture is normal. normal erect    Strength:    Strength is V/V in the upper and lower limbs.      Sensation: intact to LT     Reflex Exam:  DTR's:    Deep tendon reflexes in the upper and lower extremities are symmetrical bilaterally.   Toes:    The toes are downgoing bilaterally.   Clonus:    Clonus is absent.    Assessment/Plan:  44 y.o. female here as requested by Loman Brooklyn, FNP for migraines.  Patient has a past medical history of IDIOPATHIC INTRACRANIAL HYPERTENSION,  congenital blindness OD, migraines, morbid obesity, hypertension, vision loss, PCOS, diabetes, hyperlipidemia, congenital blindness of right eye, OD glaucoma.  Patient has a very  complicated history of a diagnosis of IDIOPATHIC INTRACRANIAL HYPERTENSION in April 2017 status post VP shunt with infection and abscess, removal of shunt with long-term antibiotics and replacement on the left.  Patient has not been evaluated since 2019 and reports for worsening headaches and vision changes.  Patient needs a thorough evaluation and she also needs migraine management in conjunction with her IDIOPATHIC INTRACRANIAL HYPERTENSION.   - XR SHUNT SERIES (AP and lateral skull, AP cervical spine, AP chest, AP abdomen). - MRI of the brain wo contrast(alergy to contrast, declines): To evaluate for infectious process, abscess, edema or other especially given history of brain abscess and MRSA shunt infection due to worsening headache and vision changes. - Healthy weight and wellness center - OSA: not compliant, encouraged compliance - Encouraged healthy diet and exercise MRI of the brain wo contrast (she is severly allergic to contrast) Ajovy for migraines Reduce amitriptyline by 1/2 tab and when feeling better on the Ajovy stop amitriptyline.  Continue to follow with neuroophthalmology  Discussed: To prevent or relieve headaches, try the following: Cool Compress. Lie down and place a cool compress on your head.  Avoid headache triggers. If certain foods or odors seem to have triggered your migraines in the past, avoid them. A headache diary might help you identify triggers.  Include physical activity in your daily routine. Try a daily walk or other moderate aerobic exercise.  Manage stress. Find healthy ways to cope with the stressors, such as delegating tasks on your to-do list.  Practice relaxation techniques. Try deep breathing, yoga, massage and visualization.  Eat regularly. Eating regularly scheduled meals and maintaining a healthy diet might help prevent headaches. Also, drink plenty of fluids.  Follow a regular sleep schedule. Sleep deprivation might contribute  to headaches Consider  biofeedback. With this mind-body technique, you learn to control certain bodily functions -- such as muscle tension, heart rate and blood pressure -- to prevent headaches or reduce headache pain.    Proceed to emergency room if you experience new or worsening symptoms or symptoms do not resolve, if you have new neurologic symptoms or if headache is severe, or for any concerning symptom.   Provided education and documentation from American headache Society toolbox including articles on: chronic migraine medication overuse headache, chronic migraines, prevention of migraines, behavioral and other nonpharmacologic treatments for headache.   Orders Placed This Encounter  Procedures  . MR BRAIN WO CONTRAST  . Ambulatory referral to [place] ordered this encounter  Medications  . Fremanezumab-vfrm (AJOVY) 225 MG/1.5ML SOAJ    Sig: Inject 225 mg into the skin every 30 (thirty) days. 90 day supply. Patient has copay card; she can have medication for $5 regardless of insurance approval or copay amount.    Dispense:  4.5 mL    Refill:  3    Cc: Loman Brooklyn, FNP,  Loman Brooklyn, FNP  Vanessa Ill, MD  [facility] [address] [place], [place] [address]  Phone [phone] Fax [phone]  I spent more than 120 minutes of face-to-face and non-face-to-face time with patient on the  1. Morbid obesity ([place])   2. IIH (idiopathic intracranial hypertension)   3. S/P ventriculoperitoneal shunt   4. Chronic migraine without aura without status migrainosus, not intractable   5. Positional headache   6. Vision loss   7. Worsening headaches   8. Cerebral abscess    diagnosis.  This included previsit chart review, lab review, study review, order entry, electronic health record documentation, patient education on the different diagnostic and therapeutic options, counseling and coordination of care, risks and benefits of management, compliance,  or risk factor reduction

## 2019-10-07 ENCOUNTER — Telehealth: Payer: Self-pay | Admitting: Neurology

## 2019-10-07 ENCOUNTER — Encounter (INDEPENDENT_AMBULATORY_CARE_PROVIDER_SITE_OTHER): Payer: Self-pay

## 2019-10-07 NOTE — Telephone Encounter (Signed)
[employer] medicare/mcd [place] order sent to [facility]. They will reach out to the patient to schedule.

## 2019-10-11 ENCOUNTER — Other Ambulatory Visit: Payer: Self-pay | Admitting: Family Medicine

## 2019-10-11 DIAGNOSIS — E1169 Type 2 diabetes mellitus with other specified complication: Secondary | ICD-10-CM

## 2019-10-11 NOTE — Addendum Note (Signed)
Addended by: Antonietta Barcelona D on: 10/11/2019 12:38 PM   Modules accepted: Orders

## 2019-10-12 ENCOUNTER — Other Ambulatory Visit: Payer: Self-pay | Admitting: Neurology

## 2019-10-14 ENCOUNTER — Telehealth: Payer: Self-pay | Admitting: Family Medicine

## 2019-10-14 ENCOUNTER — Other Ambulatory Visit: Payer: Self-pay | Admitting: Neurology

## 2019-10-14 DIAGNOSIS — T8509XA Other mechanical complication of ventricular intracranial (communicating) shunt, initial encounter: Secondary | ICD-10-CM

## 2019-10-14 DIAGNOSIS — E119 Type 2 diabetes mellitus without complications: Secondary | ICD-10-CM

## 2019-10-14 NOTE — Telephone Encounter (Signed)
  REFERRAL REQUEST Telephone Note 10/14/2019  What type of referral do you need? [facility]  Why do you need this referral? See previous DX  Have you been seen at our office for this problem? YES - patient was going somewhere else but it did not work out so she would like to be Referred to [facility]. (Advise that they may need an appointment with their PCP before a referral can be done)  Is there a particular doctor or location that you prefer? [place].  Patient notified that referrals can take up to a week or longer to process. If they haven't heard anything within a week they should call back and speak with the referral department.

## 2019-10-15 NOTE — Telephone Encounter (Signed)
Referral placed.

## 2019-10-18 ENCOUNTER — Telehealth: Payer: Self-pay

## 2019-10-18 NOTE — Telephone Encounter (Signed)
Tamra, pharmacist at [facility] in [place], is having trouble with pt's copay card for ajovy. Please call pharmacy.

## 2019-10-19 NOTE — Telephone Encounter (Signed)
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 I spoke with the pharmacist. They will initiate a PA.

## 2019-10-20 ENCOUNTER — Other Ambulatory Visit: Payer: Self-pay | Admitting: Neurology

## 2019-10-20 ENCOUNTER — Other Ambulatory Visit: Payer: Self-pay | Admitting: *Deleted

## 2019-10-20 DIAGNOSIS — G43709 Chronic migraine without aura, not intractable, without status migrainosus: Secondary | ICD-10-CM

## 2019-10-20 MED ORDER — EMGALITY 120 MG/ML ~~LOC~~ SOAJ: 120.0000 mg | SUBCUTANEOUS | 11 refills | Status: DC

## 2019-10-20 NOTE — Telephone Encounter (Signed)
I spoke with the pt and discussed the plan to change from Ajovy to [facility] due to insurance requirement. The patient verbalized understanding and we discussed the medication and the instructions for administration. Pt aware there will be detailed instructions with each pen. She understandings the pens should be refrigerated. Next dose due on 11/05/19. Insurance auth pending. Pt also stated she hasn't been able to reach her neurosurgeon to obtain info on the shunt so she can have MRI. I was able to locate some information to give to the MRI team to see if this is what they are looking for. Pt verbalized appreciation. I let her know we would call if we need further information.

## 2019-10-20 NOTE — Telephone Encounter (Signed)
Completed Emgality PA on Cover My Meds. Key: [id number]. Awaiting determination from [facility] rx part D.

## 2019-10-20 NOTE — Telephone Encounter (Signed)
Completed Ajovy PA on Cover My Meds. Key: [id number]. Awaiting [facility] rx part d determination.

## 2019-10-20 NOTE — Telephone Encounter (Signed)
Per Cover My Meds, Vanessa [name] has been denied due to insurance's requirement to try and fail or have contraindication to [place] and Emgality.   [id number]  If we should choose to appeal, fax within 60 days to [phone].

## 2019-10-20 NOTE — Telephone Encounter (Signed)
APPROVED  Request Reference Number: [id number]. EMGALITY INJ 120MG /ML is approved through 02/25/2020

## 2019-10-20 NOTE — Telephone Encounter (Signed)
Emgality prescription placed

## 2019-10-20 NOTE — Telephone Encounter (Addendum)
Noted I faxed the approval information to pt's pharmacy with request to cancel Ajovy. Received a receipt of confirmation.

## 2019-10-26 ENCOUNTER — Ambulatory Visit (INDEPENDENT_AMBULATORY_CARE_PROVIDER_SITE_OTHER): Payer: Medicare Other | Admitting: Family Medicine

## 2019-11-09 ENCOUNTER — Ambulatory Visit (INDEPENDENT_AMBULATORY_CARE_PROVIDER_SITE_OTHER): Payer: Medicare Other | Admitting: Family Medicine

## 2019-11-16 ENCOUNTER — Other Ambulatory Visit: Payer: Self-pay | Admitting: *Deleted

## 2019-11-16 DIAGNOSIS — I10 Essential (primary) hypertension: Secondary | ICD-10-CM

## 2019-11-16 MED ORDER — LISINOPRIL 40 MG PO TABS: 40.0000 mg | ORAL_TABLET | Freq: Every day | ORAL | 0 refills | Status: DC

## 2019-11-24 ENCOUNTER — Other Ambulatory Visit: Payer: Self-pay

## 2019-11-28 ENCOUNTER — Ambulatory Visit
Admission: RE | Admit: 2019-11-28 | Discharge: 2019-11-28 | Disposition: A | Discharge status: Home / Self Care | Payer: Medicare Other | Source: Ambulatory Visit | Attending: Neurology | Admitting: Neurology

## 2019-11-28 ENCOUNTER — Other Ambulatory Visit: Payer: Self-pay

## 2019-11-28 DIAGNOSIS — G932 Benign intracranial hypertension: Secondary | ICD-10-CM

## 2019-11-28 DIAGNOSIS — H547 Unspecified visual loss: Secondary | ICD-10-CM

## 2019-11-28 DIAGNOSIS — G060 Intracranial abscess and granuloma: Secondary | ICD-10-CM

## 2019-11-28 DIAGNOSIS — R51 Headache with orthostatic component, not elsewhere classified: Secondary | ICD-10-CM

## 2019-11-28 DIAGNOSIS — R519 Headache, unspecified: Secondary | ICD-10-CM

## 2019-11-28 DIAGNOSIS — Z982 Presence of cerebrospinal fluid drainage device: Secondary | ICD-10-CM

## 2019-12-06 ENCOUNTER — Other Ambulatory Visit: Payer: Self-pay | Admitting: Family Medicine

## 2019-12-21 ENCOUNTER — Other Ambulatory Visit: Payer: Self-pay

## 2019-12-21 ENCOUNTER — Ambulatory Visit
Admission: RE | Admit: 2019-12-21 | Discharge: 2019-12-21 | Disposition: A | Discharge status: Home / Self Care | Payer: Medicare Other | Source: Ambulatory Visit | Attending: Neurology | Admitting: Neurology

## 2019-12-21 ENCOUNTER — Ambulatory Visit: Payer: Medicare Other | Admitting: Nutrition

## 2019-12-21 DIAGNOSIS — R51 Headache with orthostatic component, not elsewhere classified: Secondary | ICD-10-CM

## 2019-12-21 DIAGNOSIS — G932 Benign intracranial hypertension: Secondary | ICD-10-CM

## 2019-12-21 NOTE — Telephone Encounter (Signed)
Received request for Atorvastatin 20 mg, denied request because of incorrect dosage, should be 40 mg

## 2019-12-24 ENCOUNTER — Encounter: Payer: Self-pay | Admitting: Family Medicine

## 2019-12-24 ENCOUNTER — Ambulatory Visit (INDEPENDENT_AMBULATORY_CARE_PROVIDER_SITE_OTHER): Payer: Medicare Other | Admitting: Family Medicine

## 2019-12-24 ENCOUNTER — Ambulatory Visit: Payer: Medicare Other | Admitting: Family Medicine

## 2019-12-24 ENCOUNTER — Other Ambulatory Visit: Payer: Self-pay

## 2019-12-24 VITALS — BP 151/91 | HR 91 | Temp 98.5°F | Ht 70.0 in | Wt 368.2 lb

## 2019-12-24 DIAGNOSIS — M25571 Pain in right ankle and joints of right foot: Secondary | ICD-10-CM

## 2019-12-24 DIAGNOSIS — E119 Type 2 diabetes mellitus without complications: Secondary | ICD-10-CM | POA: Diagnosis not present

## 2019-12-24 DIAGNOSIS — G43809 Other migraine, not intractable, without status migrainosus: Secondary | ICD-10-CM

## 2019-12-24 DIAGNOSIS — M542 Cervicalgia: Secondary | ICD-10-CM

## 2019-12-24 DIAGNOSIS — I10 Essential (primary) hypertension: Secondary | ICD-10-CM

## 2019-12-24 DIAGNOSIS — E1169 Type 2 diabetes mellitus with other specified complication: Secondary | ICD-10-CM | POA: Diagnosis not present

## 2019-12-24 DIAGNOSIS — Z8739 Personal history of other diseases of the musculoskeletal system and connective tissue: Secondary | ICD-10-CM

## 2019-12-24 DIAGNOSIS — M25562 Pain in left knee: Secondary | ICD-10-CM

## 2019-12-24 DIAGNOSIS — M109 Gout, unspecified: Secondary | ICD-10-CM

## 2019-12-24 DIAGNOSIS — J302 Other seasonal allergic rhinitis: Secondary | ICD-10-CM

## 2019-12-24 DIAGNOSIS — E785 Hyperlipidemia, unspecified: Secondary | ICD-10-CM

## 2019-12-24 DIAGNOSIS — F5101 Primary insomnia: Secondary | ICD-10-CM

## 2019-12-24 DIAGNOSIS — G8929 Other chronic pain: Secondary | ICD-10-CM

## 2019-12-24 LAB — CBC WITH DIFFERENTIAL/PLATELET
Basophils Absolute: 0 10*3/uL (ref 0.0–0.2)
Basos: 0 %
EOS (ABSOLUTE): 0.1 10*3/uL (ref 0.0–0.4)
Eos: 2 %
Hematocrit: 38 % (ref 34.0–46.6)
Hemoglobin: 12 g/dL (ref 11.1–15.9)
Immature Grans (Abs): 0 10*3/uL (ref 0.0–0.1)
Immature Granulocytes: 0 %
Lymphocytes Absolute: 2.1 10*3/uL (ref 0.7–3.1)
Lymphs: 33 %
MCH: 26.7 pg (ref 26.6–33.0)
MCHC: 31.6 g/dL (ref 31.5–35.7)
MCV: 84 fL (ref 79–97)
Monocytes Absolute: 0.4 10*3/uL (ref 0.1–0.9)
Monocytes: 7 %
Neutrophils Absolute: 3.6 10*3/uL (ref 1.4–7.0)
Neutrophils: 58 %
Platelets: 202 10*3/uL (ref 150–450)
RBC: 4.5 x10E6/uL (ref 3.77–5.28)
RDW: 14.1 % (ref 11.7–15.4)
WBC: 6.3 10*3/uL (ref 3.4–10.8)

## 2019-12-24 LAB — LIPID PANEL
Chol/HDL Ratio: 4.8 ratio — ABNORMAL HIGH (ref 0.0–4.4)
Cholesterol, Total: 186 mg/dL (ref 100–199)
HDL: 39 mg/dL — ABNORMAL LOW (ref >39)
LDL Chol Calc (NIH): 122 mg/dL — ABNORMAL HIGH (ref 0–99)
Triglycerides: 139 mg/dL (ref 0–149)
VLDL Cholesterol Cal: 25 mg/dL (ref 5–40)

## 2019-12-24 LAB — CMP14+EGFR
ALT: 29 IU/L (ref 0–32)
AST: 24 IU/L (ref 0–40)
Albumin/Globulin Ratio: 1.4 (ref 1.2–2.2)
Albumin: 4.1 g/dL (ref 3.8–4.8)
Alkaline Phosphatase: 59 IU/L (ref 44–121)
BUN/Creatinine Ratio: 9 (ref 9–23)
BUN: 7 mg/dL (ref 6–24)
Bilirubin Total: 0.3 mg/dL (ref 0.0–1.2)
CO2: 22 mmol/L (ref 20–29)
Calcium: 9.3 mg/dL (ref 8.7–10.2)
Chloride: 103 mmol/L (ref 96–106)
Creatinine, Ser: 0.78 mg/dL (ref 0.57–1.00)
GFR calc Af Amer: 108 mL/min/{1.73_m2} (ref >59)
GFR calc non Af Amer: 93 mL/min/{1.73_m2} (ref >59)
Globulin, Total: 2.9 g/dL (ref 1.5–4.5)
Glucose: 184 mg/dL — ABNORMAL HIGH (ref 65–99)
Potassium: 4.2 mmol/L (ref 3.5–5.2)
Sodium: 140 mmol/L (ref 134–144)
Total Protein: 7 g/dL (ref 6.0–8.5)

## 2019-12-24 LAB — BAYER DCA HB A1C WAIVED: HB A1C (BAYER DCA - WAIVED): 7.9 % — ABNORMAL HIGH (ref <7.0)

## 2019-12-24 MED ORDER — ALLOPURINOL 300 MG PO TABS: 300.0000 mg | ORAL_TABLET | Freq: Every day | ORAL | 6 refills | Status: DC

## 2019-12-24 MED ORDER — OXYCODONE HCL 10 MG PO TABS: 10.0000 mg | ORAL_TABLET | Freq: Two times a day (BID) | ORAL | 0 refills | Status: DC | PRN

## 2019-12-24 MED ORDER — ATORVASTATIN CALCIUM 40 MG PO TABS: 40.0000 mg | ORAL_TABLET | Freq: Every day | ORAL | 0 refills | Status: DC

## 2019-12-24 MED ORDER — FLUOXETINE HCL 20 MG PO CAPS
20.0000 mg | ORAL_CAPSULE | Freq: Every evening | ORAL | 11 refills | Status: DC
Start: 2019-12-24 — End: 2020-11-27

## 2019-12-24 MED ORDER — LISINOPRIL 40 MG PO TABS: 40.0000 mg | ORAL_TABLET | Freq: Every day | ORAL | 0 refills | Status: DC

## 2019-12-24 MED ORDER — ZALEPLON 5 MG PO CAPS: 5.0000 mg | ORAL_CAPSULE | Freq: Every evening | ORAL | 0 refills | Status: DC | PRN

## 2019-12-24 MED ORDER — SPIRONOLACTONE 50 MG PO TABS: 50.0000 mg | ORAL_TABLET | Freq: Every day | ORAL | 1 refills | Status: DC

## 2019-12-24 MED ORDER — OZEMPIC (0.25 OR 0.5 MG/DOSE) 2 MG/1.5ML ~~LOC~~ SOPN: 0.5000 mg | PEN_INJECTOR | SUBCUTANEOUS | 2 refills | Status: DC

## 2019-12-24 MED ORDER — FUROSEMIDE 40 MG PO TABS: 40.0000 mg | ORAL_TABLET | Freq: Every day | ORAL | 3 refills | Status: DC

## 2019-12-24 MED ORDER — MONTELUKAST SODIUM 10 MG PO TABS: 10.0000 mg | ORAL_TABLET | Freq: Every day | ORAL | 1 refills | Status: DC

## 2019-12-24 MED ORDER — OZEMPIC (0.25 OR 0.5 MG/DOSE) 2 MG/1.5ML ~~LOC~~ SOPN: 1.0000 mg | PEN_INJECTOR | SUBCUTANEOUS | 2 refills | Status: DC

## 2019-12-24 MED ORDER — CYCLOBENZAPRINE HCL 10 MG PO TABS: ORAL_TABLET | ORAL | 2 refills | Status: DC

## 2019-12-24 MED ORDER — NURTEC 75 MG PO TBDP: 75.0000 mg | ORAL_TABLET | Freq: Every day | ORAL | 2 refills | Status: DC | PRN

## 2019-12-24 NOTE — Patient Instructions (Addendum)
Increase Ozempic to 1 mg weekly. Increase Lasix to 40 mg daily. If BP not 130/80s, let me know and we will add amlodipine 5 mg    DASH Eating Plan DASH stands for "Dietary Approaches to Stop Hypertension." The DASH eating plan is a healthy eating plan that has been shown to reduce high blood pressure (hypertension). It may also reduce your risk for type 2 diabetes, heart disease, and stroke. The DASH eating plan may also help with weight loss. What are tips for following this plan?  General guidelines  Avoid eating more than 2,300 mg (milligrams) of salt (sodium) a day. If you have hypertension, you may need to reduce your sodium intake to 1,500 mg a day.  Limit alcohol intake to no more than 1 drink a day for nonpregnant women and 2 drinks a day for men. One drink equals 12 oz of beer, 5 oz of wine, or 1 oz of hard liquor.  Work with your health care provider to maintain a healthy body weight or to lose weight. Ask what an ideal weight is for you.  Get at least 30 minutes of exercise that causes your heart to beat faster (aerobic exercise) most days of the week. Activities may include walking, swimming, or biking.  Work with your health care provider or diet and nutrition specialist (dietitian) to adjust your eating plan to your individual calorie needs. Reading food labels   Check food labels for the amount of sodium per serving. Choose foods with less than 5 percent of the Daily Value of sodium. Generally, foods with less than 300 mg of sodium per serving fit into this eating plan.  To find whole grains, look for the word "whole" as the first word in the ingredient list. Shopping  Buy products labeled as "low-sodium" or "no salt added."  Buy fresh foods. Avoid canned foods and premade or frozen meals. Cooking  Avoid adding salt when cooking. Use salt-free seasonings or herbs instead of table salt or sea salt. Check with your health care provider or pharmacist before using salt  substitutes.  Do not fry foods. Cook foods using healthy methods such as baking, boiling, grilling, and broiling instead.  Cook with heart-healthy oils, such as olive, canola, soybean, or sunflower oil. Meal planning  Eat a balanced diet that includes: ? 5 or more servings of fruits and vegetables each day. At each meal, try to fill half of your plate with fruits and vegetables. ? Up to 6-8 servings of whole grains each day. ? Less than 6 oz of lean meat, poultry, or fish each day. A 3-oz serving of meat is about the same size as a deck of cards. One egg equals 1 oz. ? 2 servings of low-fat dairy each day. ? A serving of nuts, seeds, or beans 5 times each week. ? Heart-healthy fats. Healthy fats called Omega-3 fatty acids are found in foods such as flaxseeds and coldwater fish, like sardines, salmon, and mackerel.  Limit how much you eat of the following: ? Canned or prepackaged foods. ? Food that is high in trans fat, such as fried foods. ? Food that is high in saturated fat, such as fatty meat. ? Sweets, desserts, sugary drinks, and other foods with added sugar. ? Full-fat dairy products.  Do not salt foods before eating.  Try to eat at least 2 vegetarian meals each week.  Eat more home-cooked food and less restaurant, buffet, and fast food.  When eating at a restaurant, ask that your  food be prepared with less salt or no salt, if possible. What foods are recommended? The items listed may not be a complete list. Talk with your dietitian about what dietary choices are best for you. Grains Whole-grain or whole-wheat bread. Whole-grain or whole-wheat pasta. Brown rice. [place]. Bulgur. Whole-grain and low-sodium cereals. Pita bread. Low-fat, low-sodium crackers. Whole-wheat flour tortillas. Vegetables Fresh or frozen vegetables (raw, steamed, roasted, or grilled). Low-sodium or reduced-sodium tomato and vegetable juice. Low-sodium or reduced-sodium tomato sauce and tomato  paste. Low-sodium or reduced-sodium canned vegetables. Fruits All fresh, dried, or frozen fruit. Canned fruit in natural juice (without added sugar). Meat and other protein foods Skinless chicken or [place]. Ground chicken or [place]. Pork with fat trimmed off. Fish and seafood. Egg whites. Dried beans, peas, or lentils. Unsalted nuts, nut butters, and seeds. Unsalted canned beans. Lean cuts of beef with fat trimmed off. Low-sodium, lean deli meat. Dairy Low-fat (1%) or fat-free (skim) milk. Fat-free, low-fat, or reduced-fat cheeses. Nonfat, low-sodium ricotta or cottage cheese. Low-fat or nonfat yogurt. Low-fat, low-sodium cheese. Fats and oils Soft margarine without trans fats. Vegetable oil. Low-fat, reduced-fat, or light mayonnaise and salad dressings (reduced-sodium). Canola, safflower, olive, soybean, and sunflower oils. Avocado. Seasoning and other foods Herbs. Spices. Seasoning mixes without salt. Unsalted popcorn and pretzels. Fat-free sweets. What foods are not recommended? The items listed may not be a complete list. Talk with your dietitian about what dietary choices are best for you. Grains Baked goods made with fat, such as croissants, muffins, or some breads. Dry pasta or rice meal packs. Vegetables Creamed or fried vegetables. Vegetables in a cheese sauce. Regular canned vegetables (not low-sodium or reduced-sodium). Regular canned tomato sauce and paste (not low-sodium or reduced-sodium). Regular tomato and vegetable juice (not low-sodium or reduced-sodium). Angie Fava. Olives. Fruits Canned fruit in a light or heavy syrup. Fried fruit. Fruit in cream or butter sauce. Meat and other protein foods Fatty cuts of meat. Ribs. Fried meat. Berniece Salines. Sausage. Bologna and other processed lunch meats. Salami. Fatback. Hotdogs. Bratwurst. Salted nuts and seeds. Canned beans with added salt. Canned or smoked fish. Whole eggs or egg yolks. Chicken or [place] with skin. Dairy Whole or 2% milk,  cream, and half-and-half. Whole or full-fat cream cheese. Whole-fat or sweetened yogurt. Full-fat cheese. Nondairy creamers. Whipped toppings. Processed cheese and cheese spreads. Fats and oils Butter. Stick margarine. Lard. Shortening. Ghee. Bacon fat. Tropical oils, such as coconut, palm kernel, or palm oil. Seasoning and other foods Salted popcorn and pretzels. Onion salt, garlic salt, seasoned salt, table salt, and sea salt. Worcestershire sauce. Tartar sauce. Barbecue sauce. Teriyaki sauce. Soy sauce, including reduced-sodium. Steak sauce. Canned and packaged gravies. Fish sauce. Oyster sauce. Cocktail sauce. Horseradish that you find on the shelf. Ketchup. Mustard. Meat flavorings and tenderizers. Bouillon cubes. Hot sauce and Tabasco sauce. Premade or packaged marinades. Premade or packaged taco seasonings. Relishes. Regular salad dressings. Where to find more information:  National Heart, Lung, and [place]: https://wilson-eaton.com/  American Heart Association: www.heart.org Summary  The DASH eating plan is a healthy eating plan that has been shown to reduce high blood pressure (hypertension). It may also reduce your risk for type 2 diabetes, heart disease, and stroke.  With the DASH eating plan, you should limit salt (sodium) intake to 2,300 mg a day. If you have hypertension, you may need to reduce your sodium intake to 1,500 mg a day.  When on the DASH eating plan, aim to eat more fresh fruits and vegetables,  whole grains, lean proteins, low-fat dairy, and heart-healthy fats.  Work with your health care provider or diet and nutrition specialist (dietitian) to adjust your eating plan to your individual calorie needs. This information is not intended to replace advice given to you by your health care provider. Make sure you discuss any questions you have with your health care provider. Document Revised: 01/24/2017 Document Reviewed: 02/05/2016 Elsevier Patient Education  2020 [employer].

## 2019-12-24 NOTE — Progress Notes (Signed)
Subjective:  Patient ID: Vanessa [name], female    DOB: 25-Oct-1975  Age: 44 y.o. MRN: [id number]  CC: Medical Management of Chronic Issues   HPI Vanessa [name] presents forFollow-up of diabetes, HTN, HLD, chronic pain.   1. T2DM Compliant with meds - Ozempic 0.5 mg weekly Checking CBGs? Yes  Fasting avg - 105-155  Postprandial average - 105-155 Exercising regularly? - walks with weights daily for about 10 minutes. She has been slowly increase her time each week Watching carbohydrate intake? - Yes, but he has been drinking sodas for headaches Neuropathy ? - No Hypoglycemic events - No   Pertinent ROS:  Polyuria - No Polydipsia - No Vision problems - Yes, followed by neuro for ICH. Had eye exam about 8 months ago.   Does not tolerate metformin. Has done well on Januvia in the past.  2. HTN Complaint with meds - Yes Current Medications - lisinopril 40 mg, lasix 20 mg Checking BP at home ranging 145/80 Exercising Regularly - Yes Watching Salt intake - Yes Pertinent ROS:  Headache - baseline for her Fatigue - No Visual Disturbances - No Chest pain - No Dyspnea - No Palpitations - No LE edema - yes, she does not feel like her fluid pill is working as well  Family, social, and smoking history reviewed.   BP Readings from Last 3 Encounters:  12/24/19 (!) 151/91  10/05/19 (!) 142/85  09/23/19 (!) 159/87   3. Chronic Opioid use Indication for chronic opioid: knee pain Taking as needed BID Medication and dose: Oxycodone HCI 10 mg BID prn # pills per month: 60 Last UDS date: 07/06/19 Opioid Treatment Agreement signed (Y/N): yes Opioid Treatment Agreement last reviewed with patient:  07/06/19 [facility] reviewed this encounter (include red flags):  no red flags  4. Insomnia Difficulty falling and staying asleep for about 6 months. She gets a few hours of sleep at night. She wakes up frequently. She has taken sonata in the past with good results. She has tried lifestyle  changes and melatonin without relief.   Medications as noted below. Taking them regularly without complication/adverse reaction being reported today.   She needs refills on all of her medications today.   History Vanessa [name] has a past medical history of Hypertension, Migraines, Pseudotumor (inflammatory) of orbit, Sepsis ([place]) (02/2019), and Vision loss.   She has a past surgical history that includes CSF shunt.   Her family history includes Congestive Heart Failure in her mother; Hypertension in her sister; Pulmonary Hypertension in her mother.She reports that she has never smoked. She has never used smokeless tobacco. She reports that she does not drink alcohol and does not use drugs.  Current Outpatient Medications on File Prior to Visit  Medication Sig Dispense Refill  . allopurinol (ZYLOPRIM) 300 MG tablet Take 1 tablet (300 mg total) by mouth daily. 30 tablet 6  . amitriptyline (ELAVIL) 50 MG tablet Take 1 tablet (50 mg total) by mouth daily. 90 tablet 1  . atorvastatin (LIPITOR) 40 MG tablet TAKE 1 TABLET BY MOUTH EVERY DAY 90 tablet 0  . cyclobenzaprine (FLEXERIL) 10 MG tablet TAKE 1 TABLET BY MOUTH THREE TIMES A DAY AS NEEDED FOR MUSCLE SPASMS 60 tablet 2  . FLUoxetine (PROZAC) 20 MG capsule Take 1 capsule by mouth every evening. 30 capsule 11  . furosemide (LASIX) 20 MG tablet Take 20 mg by mouth daily.    . Galcanezumab-gnlm (EMGALITY) 120 MG/ML SOAJ Inject 120 mg into the skin every 30 (thirty) days. 1.12  mL 11  . glucose blood (ONE TOUCH ULTRA TEST) test strip Test sugar daily Dx E11.9 100 each 3  . halobetasol (ULTRAVATE) 0.05 % cream Apply topically 2 (two) times daily. 50 g 5  . lisinopril (ZESTRIL) 40 MG tablet Take 1 tablet (40 mg total) by mouth daily. 90 tablet 0  . loratadine (CLARITIN) 10 MG tablet Take 1 tablet by mouth daily. 90 tablet 1  . montelukast (SINGULAIR) 10 MG tablet Take 1 tablet (10 mg total) by mouth at bedtime. 90 tablet 1  . OneTouch Delica Lancets 53I  MISC 1 each by Does not apply route daily. Dx E11.9 100 each 3  . Oxycodone HCl 10 MG TABS Take 1 tablet (10 mg total) by mouth 2 (two) times daily as needed. 60 tablet 0  . Oxycodone HCl 10 MG TABS Take 1 tablet (10 mg total) by mouth 2 (two) times daily as needed. 60 tablet 0  . Oxycodone HCl 10 MG TABS Take 1 tablet (10 mg total) by mouth 2 (two) times daily as needed. 60 tablet 0  . Rimegepant Sulfate (NURTEC) 75 MG TBDP Take 75 mg by mouth daily as needed. 10 tablet 2  . Semaglutide,0.25 or 0.5MG /DOS, (OZEMPIC, 0.25 OR 0.5 MG/DOSE,) 2 MG/1.5ML SOPN Inject 0.5 mg into the skin once a week. 4 pen 3  . spironolactone (ALDACTONE) 50 MG tablet Take 1 tablet (50 mg total) by mouth daily. 90 tablet 1  . acetaZOLAMIDE (DIAMOX) 250 MG tablet Take 2 tablets (500 mg total) by mouth 2 (two) times daily. (Patient not taking: Reported on 12/24/2019) 360 tablet 1   No current facility-administered medications on file prior to visit.    ROS Review of Systems  Per HPI.   Objective:  BP (!) 151/91   Pulse 91   Temp 98.5 F (36.9 C) (Temporal)   Ht 5\' 10"  (1.778 m)   Wt (!) 368 lb 4 oz (167 kg)   BMI 52.84 kg/m   BP Readings from Last 3 Encounters:  12/24/19 (!) 151/91  10/05/19 (!) 142/85  09/23/19 (!) 159/87    Wt Readings from Last 3 Encounters:  12/24/19 (!) 368 lb 4 oz (167 kg)  10/05/19 (!) 361 lb (163.7 kg)  09/23/19 (!) 356 lb 9.6 oz (161.8 kg)    Physical Exam Vitals and nursing note reviewed.  Constitutional:      Appearance: Normal appearance.  HENT:     Head: Normocephalic and atraumatic.  Cardiovascular:     Rate and Rhythm: Normal rate and regular rhythm.     Pulses: Normal pulses.     Heart sounds: Normal heart sounds. No murmur heard.   Pulmonary:     Effort: Pulmonary effort is normal. No respiratory distress.     Breath sounds: Normal breath sounds.  Abdominal:     General: Bowel sounds are normal. There is no distension.     Palpations: Abdomen is soft.      Tenderness: There is no abdominal tenderness. There is no guarding.  Musculoskeletal:     Right lower leg: No edema.     Left lower leg: No edema.  Skin:    General: Skin is warm and dry.  Neurological:     General: No focal deficit present.     Mental Status: She is alert and oriented to person, place, and time.  Psychiatric:        Mood and Affect: Mood normal.        Behavior: Behavior normal.  Lab Results  Component Value Date   HGBA1C 6.9 07/06/2019   HGBA1C 6.3 04/07/2019   HGBA1C 8.9 (H) 10/02/2018    Lab Results  Component Value Date   WBC 5.4 04/07/2019   HGB 12.2 04/07/2019   HCT 39.3 04/07/2019   PLT 212 04/07/2019   GLUCOSE 99 07/06/2019   CHOL 197 07/06/2019   TRIG 143 07/06/2019   HDL 39 (L) 07/06/2019   LDLCALC 132 (H) 07/06/2019   ALT 24 07/06/2019   AST 23 07/06/2019   NA 140 07/06/2019   K 4.2 07/06/2019   CL 102 07/06/2019   CREATININE 0.79 07/06/2019   BUN 7 07/06/2019   CO2 27 07/06/2019   HGBA1C 6.9 07/06/2019     Assessment & Plan:  Vanessa [name] was seen today for medical management of chronic issues.  Diagnoses and all orders for this visit:  Diabetes mellitus without complication (HCC) Q1F 7.9 today. Increase Ozempic to 1 mg weekly. Follow up in 3 months. -     CBC with Differential/Platelet -     CMP14+EGFR -     Lipid panel -     Bayer DCA Hb A1c Waived -     Semaglutide,0.25 or 0.5MG /DOS, (OZEMPIC, 0.25 OR 0.5 MG/DOSE,) 2 MG/1.5ML SOPN; Inject 1 mg into the skin once a week.  Essential hypertension No well controlled. Increase Lasix to 40 mg since she is reporting that her fluid pill is not working well.  Patient will notify me if BP not at goal over the next week and will add amlodipine if needed. Follow up in 3 months.  -     CBC with Differential/Platelet -     CMP14+EGFR -     Lipid panel -     Bayer DCA Hb A1c Waived -     lisinopril (ZESTRIL) 40 MG tablet; Take 1 tablet (40 mg total) by mouth daily. -     spironolactone  (ALDACTONE) 50 MG tablet; Take 1 tablet (50 mg total) by mouth daily. -     furosemide (LASIX) 40 MG tablet; Take 1 tablet (40 mg total) by mouth daily.  Hyperlipidemia associated with type 2 diabetes mellitus ([place]) Continue current regimen for now. Lab work pending. -     CBC with Differential/Platelet -     CMP14+EGFR -     Lipid panel -     Bayer DCA Hb A1c Waived -     atorvastatin (LIPITOR) 40 MG tablet; Take 1 tablet (40 mg total) by mouth daily.  Acute right ankle pain -     allopurinol (ZYLOPRIM) 300 MG tablet; Take 1 tablet (300 mg total) by mouth daily.  History of gout -     allopurinol (ZYLOPRIM) 300 MG tablet; Take 1 tablet (300 mg total) by mouth daily.  Acute gout of right ankle, unspecified cause -     allopurinol (ZYLOPRIM) 300 MG tablet; Take 1 tablet (300 mg total) by mouth daily.  Cervical pain Taking as needed.  -     cyclobenzaprine (FLEXERIL) 10 MG tablet; TAKE 1 TABLET BY MOUTH THREE TIMES A DAY AS NEEDED FOR MUSCLE SPASMS  Chronic pain of left knee -     Oxycodone HCl 10 MG TABS; Take 1 tablet (10 mg total) by mouth 2 (two) times daily as needed. -     Oxycodone HCl 10 MG TABS; Take 1 tablet (10 mg total) by mouth 2 (two) times daily as needed. -     Oxycodone HCl 10 MG TABS; Take  1 tablet (10 mg total) by mouth 2 (two) times daily as needed.  Other migraine without status migrainosus, not intractable -     Rimegepant Sulfate (NURTEC) 75 MG TBDP; Take 75 mg by mouth daily as needed.  Primary insomnia Added Sonata to try. This has worked well for her in the past.  -     zaleplon (SONATA) 5 MG capsule; Take 1 capsule (5 mg total) by mouth at bedtime as needed for sleep. Take 5 mg by mouth at bedtime as needed for sleep.  Seasonal allergies -     montelukast (SINGULAIR) 10 MG tablet; Take 1 tablet (10 mg total) by mouth at bedtime.  Other orders -     FLUoxetine (PROZAC) 20 MG capsule; Take 1 capsule (20 mg total) by mouth every evening.   Follow-up: 3  months for chronic illnesses.   The above assessment and management plan was discussed with the patient. The patient verbalized understanding of and has agreed to the management plan. Patient is aware to call the clinic if symptoms fail to improve or worsen. Patient is aware when to return to the clinic for a follow-up visit. Patient educated on when it is appropriate to go to the emergency department.   Marjorie Smolder, FNP-C [facility] [address] [place],  [address] [phone]

## 2019-12-27 ENCOUNTER — Other Ambulatory Visit: Payer: Self-pay | Admitting: Family Medicine

## 2019-12-27 DIAGNOSIS — E1169 Type 2 diabetes mellitus with other specified complication: Secondary | ICD-10-CM

## 2019-12-27 DIAGNOSIS — E785 Hyperlipidemia, unspecified: Secondary | ICD-10-CM

## 2020-01-06 ENCOUNTER — Other Ambulatory Visit: Payer: Self-pay | Admitting: Family Medicine

## 2020-01-06 DIAGNOSIS — E785 Hyperlipidemia, unspecified: Secondary | ICD-10-CM

## 2020-01-06 DIAGNOSIS — E1169 Type 2 diabetes mellitus with other specified complication: Secondary | ICD-10-CM

## 2020-01-06 DIAGNOSIS — I10 Essential (primary) hypertension: Secondary | ICD-10-CM

## 2020-01-06 NOTE — Addendum Note (Signed)
Addended by: Antonietta Barcelona D on: 01/06/2020 09:28 AM   Modules accepted: Orders

## 2020-01-10 ENCOUNTER — Telehealth: Payer: Self-pay

## 2020-01-11 NOTE — Telephone Encounter (Signed)
Fax from [facility]:[id number],6.07 or 0.5MG /DOS, (OZEMPIC, 0.25 OR 0.5 MG/DOSE,) 2 MG/1.5ML SOPN  pharmacy needs new rx for 1mg  for better dosing and cover 1 month supply for the pt

## 2020-01-12 ENCOUNTER — Other Ambulatory Visit: Payer: Self-pay | Admitting: Family Medicine

## 2020-01-12 DIAGNOSIS — E119 Type 2 diabetes mellitus without complications: Secondary | ICD-10-CM

## 2020-01-12 MED ORDER — OZEMPIC (1 MG/DOSE) 2 MG/1.5ML ~~LOC~~ SOPN: 1.0000 mg | PEN_INJECTOR | SUBCUTANEOUS | 12 refills | Status: DC

## 2020-01-12 NOTE — Telephone Encounter (Signed)
New Rx sent.

## 2020-01-25 ENCOUNTER — Telehealth: Payer: Self-pay

## 2020-01-25 NOTE — Telephone Encounter (Signed)
Pt called stating that today is the 2nd day of her having back pain just like when she went to the hospital and was diagnosed with a vaginal infection.  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  Please advise and call patient.

## 2020-01-25 NOTE — Telephone Encounter (Signed)
Explained to patient she would need to be seen and evaluated to determine what was wrong.  Scheduled appointment on Thursday at 3:30 pm with Je.

## 2020-01-27 ENCOUNTER — Ambulatory Visit (INDEPENDENT_AMBULATORY_CARE_PROVIDER_SITE_OTHER): Payer: Medicare Other | Admitting: Nurse Practitioner

## 2020-01-27 ENCOUNTER — Encounter: Payer: Self-pay | Admitting: Nurse Practitioner

## 2020-01-27 ENCOUNTER — Other Ambulatory Visit: Payer: Self-pay

## 2020-01-27 VITALS — BP 154/80 | HR 95 | Temp 100.3°F | Ht 70.0 in | Wt 356.5 lb

## 2020-01-27 DIAGNOSIS — R399 Unspecified symptoms and signs involving the genitourinary system: Secondary | ICD-10-CM | POA: Diagnosis not present

## 2020-01-27 DIAGNOSIS — N10 Acute tubulo-interstitial nephritis: Secondary | ICD-10-CM | POA: Insufficient documentation

## 2020-01-27 LAB — URINALYSIS, COMPLETE
Bilirubin, UA: NEGATIVE
Glucose, UA: NEGATIVE
Ketones, UA: NEGATIVE
Nitrite, UA: NEGATIVE
Protein,UA: NEGATIVE
Specific Gravity, UA: 1.02 (ref 1.005–1.030)
Urobilinogen, Ur: 0.2 mg/dL (ref 0.2–1.0)
pH, UA: 7 (ref 5.0–7.5)

## 2020-01-27 LAB — MICROSCOPIC EXAMINATION

## 2020-01-27 MED ORDER — CEFPODOXIME PROXETIL 200 MG PO TABS: 200.0000 mg | ORAL_TABLET | Freq: Two times a day (BID) | ORAL | 0 refills | Status: DC

## 2020-01-27 MED ORDER — CEFTRIAXONE SODIUM 1 G IJ SOLR
1.0000 g | Freq: Once | INTRAMUSCULAR | Status: AC
  Administered 2020-01-27: 1 g via INTRAMUSCULAR

## 2020-01-27 NOTE — Progress Notes (Signed)
Acute Office Visit  Subjective:    Patient ID: Vanessa [name], female    DOB: [birth year], 44 y.o.   MRN: [id number]  Chief Complaint  Patient presents with  . Abdominal Pain    HPI Patient is in today for Pain  She reports new onset lower abdominal and pelvic pain. was not an injury that may have caused the pain. The pain started a few days ago and is rapidly worsening. The pain does not radiate . The pain is described as aching, sharp and throbbing, is 9/10 in intensity, occurring constantly. Symptoms are worse in the: morning, mid-day, afternoon, evening  Aggravating factors: bending backwards, bending forwards, bending sideways, increased intrathoracic pressure (cough, sneeze, etc.), sitting and standing Relieving factors: none.  She has tried acetaminophen and prescription pain relievers with no relief.   ---------------------------------------------------------------------------------------------------   Past Medical History:  Diagnosis Date  . Hypertension   . Migraines   . Pseudotumor (inflammatory) of orbit   . Sepsis ([place]) 02/2019  . Vision loss     Past Surgical History:  Procedure Laterality Date  . CSF SHUNT      Family History  Problem Relation Age of Onset  . Pulmonary Hypertension Mother   . Congestive Heart Failure Mother   . Hypertension Sister   . Migraines Neg Hx     Social History   Socioeconomic History  . Marital status: Single    Spouse name: Not on file  . Number of children: Not on file  . Years of education: Not on file  . Highest education level: Not on file  Occupational History  . Not on file  Tobacco Use  . Smoking status: Never Smoker  . Smokeless tobacco: Never Used  Vaping Use  . Vaping Use: Never used  Substance and Sexual Activity  . Alcohol use: No  . Drug use: No  . Sexual activity: Not on file  Other Topics Concern  . Not on file  Social History Narrative   Lives with parents   Right handed   Caffeine: maybe 2  cups/day   Social Determinants of Health   Financial Resource Strain:   . Difficulty of Paying Living Expenses: Not on file  Food Insecurity:   . Worried About Charity fundraiser in the Last Year: Not on file  . Ran Out of Food in the Last Year: Not on file  Transportation Needs:   . Lack of Transportation (Medical): Not on file  . Lack of Transportation (Non-Medical): Not on file  Physical Activity:   . Days of Exercise per Week: Not on file  . Minutes of Exercise per Session: Not on file  Stress:   . Feeling of Stress : Not on file  Social Connections:   . Frequency of Communication with Friends and Family: Not on file  . Frequency of Social Gatherings with Friends and Family: Not on file  . Attends Religious Services: Not on file  . Active Member of Clubs or Organizations: Not on file  . Attends Archivist Meetings: Not on file  . Marital Status: Not on file  Intimate Partner Violence:   . Fear of Current or Ex-Partner: Not on file  . Emotionally Abused: Not on file  . Physically Abused: Not on file  . Sexually Abused: Not on file    Outpatient Medications Prior to Visit  Medication Sig Dispense Refill  . amitriptyline (ELAVIL) 50 MG tablet Take 1 tablet (50 mg total) by mouth daily. 90 tablet  1  . atorvastatin (LIPITOR) 40 MG tablet Take 1 tablet (40 mg total) by mouth daily. 90 tablet 0  . cyclobenzaprine (FLEXERIL) 10 MG tablet TAKE 1 TABLET BY MOUTH THREE TIMES A DAY AS NEEDED FOR MUSCLE SPASMS 60 tablet 2  . FLUoxetine (PROZAC) 20 MG capsule Take 1 capsule (20 mg total) by mouth every evening. 30 capsule 11  . furosemide (LASIX) 40 MG tablet Take 1 tablet (40 mg total) by mouth daily. 90 tablet 3  . Galcanezumab-gnlm (EMGALITY) 120 MG/ML SOAJ Inject 120 mg into the skin every 30 (thirty) days. 1.12 mL 11  . glucose blood (ONE TOUCH ULTRA TEST) test strip Test sugar daily Dx E11.9 100 each 3  . halobetasol (ULTRAVATE) 0.05 % cream Apply topically 2 (two)  times daily. 50 g 5  . lisinopril (ZESTRIL) 40 MG tablet Take 1 tablet (40 mg total) by mouth daily. 90 tablet 0  . loratadine (CLARITIN) 10 MG tablet Take 1 tablet by mouth daily. 90 tablet 1  . montelukast (SINGULAIR) 10 MG tablet Take 1 tablet (10 mg total) by mouth at bedtime. 90 tablet 1  . OneTouch Delica Lancets 32G MISC 1 each by Does not apply route daily. Dx E11.9 100 each 3  . Oxycodone HCl 10 MG TABS Take 1 tablet (10 mg total) by mouth 2 (two) times daily as needed. 60 tablet 0  . Oxycodone HCl 10 MG TABS Take 1 tablet (10 mg total) by mouth 2 (two) times daily as needed. 60 tablet 0  . Oxycodone HCl 10 MG TABS Take 1 tablet (10 mg total) by mouth 2 (two) times daily as needed. 60 tablet 0  . Rimegepant Sulfate (NURTEC) 75 MG TBDP Take 75 mg by mouth daily as needed. 10 tablet 2  . Semaglutide, 1 MG/DOSE, (OZEMPIC, 1 MG/DOSE,) 2 MG/1.5ML SOPN Inject 1 mg into the skin once a week. 3 mL 12  . spironolactone (ALDACTONE) 50 MG tablet Take 1 tablet (50 mg total) by mouth daily. 90 tablet 1  . zaleplon (SONATA) 5 MG capsule Take 1 capsule (5 mg total) by mouth at bedtime as needed for sleep. Take 5 mg by mouth at bedtime as needed for sleep. 30 capsule 0  . naproxen (NAPROSYN) 500 MG tablet Take 500 mg by mouth 2 (two) times daily.    Marland Kitchen acetaZOLAMIDE (DIAMOX) 250 MG tablet Take 2 tablets (500 mg total) by mouth 2 (two) times daily. (Patient not taking: Reported on 12/24/2019) 360 tablet 1  . allopurinol (ZYLOPRIM) 300 MG tablet Take 1 tablet (300 mg total) by mouth daily. 30 tablet 6   No facility-administered medications prior to visit.    Allergies  Allergen Reactions  . Metrizamide Other (See Comments), Itching, Photosensitivity, Shortness Of Breath and Swelling    update Migraine instantly  . Mushroom Extract Complex Itching    Throat itching with cough  . Peanut Oil Anaphylaxis and Other (See Comments)    update  . Peanut-Containing Drug Products Itching    Itching throat  with cough  . Diflucan In Dextrose [Fluconazole In Dextrose] Hives  . Iodine-131 Other (See Comments)    update    Review of Systems  Constitutional: Positive for chills and fever.  Gastrointestinal: Positive for abdominal pain, nausea and vomiting. Negative for abdominal distention.  All other systems reviewed and are negative.      Objective:    Physical Exam Vitals reviewed.  Constitutional:      Appearance: She is well-developed. She is ill-appearing.  HENT:     Head: Normocephalic.  Cardiovascular:     Rate and Rhythm: Normal rate and regular rhythm.     Heart sounds: Normal heart sounds.  Pulmonary:     Effort: Pulmonary effort is normal.     Breath sounds: Normal breath sounds.  Abdominal:     General: Bowel sounds are normal. There is no distension.     Palpations: Abdomen is soft.     Tenderness: There is abdominal tenderness in the right lower quadrant and left lower quadrant. There is right CVA tenderness. There is no left CVA tenderness.  Skin:    General: Skin is warm.  Neurological:     Mental Status: She is alert and oriented to person, place, and time.  Psychiatric:        Mood and Affect: Mood normal.        Behavior: Behavior normal.     BP (!) 154/80   Pulse 95   Temp 100.3 F (37.9 C) (Temporal)   Ht 5\' 10"  (1.778 m)   Wt (!) 356 lb 8 oz (161.7 kg)   BMI 51.15 kg/m  Wt Readings from Last 3 Encounters:  01/27/20 (!) 356 lb 8 oz (161.7 kg)  12/24/19 (!) 368 lb 4 oz (167 kg)  10/05/19 (!) 361 lb (163.7 kg)    Health Maintenance Due  Topic Date Due  . Hepatitis C Screening  Never done  . PAP SMEAR-Modifier  Never done    There are no preventive care reminders to display for this patient.   No results found for: TSH Lab Results  Component Value Date   WBC 6.3 12/24/2019   HGB 12.0 12/24/2019   HCT 38.0 12/24/2019   MCV 84 12/24/2019   PLT 202 12/24/2019   Lab Results  Component Value Date   NA 140 12/24/2019   K 4.2 12/24/2019    CO2 22 12/24/2019   GLUCOSE 184 (H) 12/24/2019   BUN 7 12/24/2019   CREATININE 0.78 12/24/2019   BILITOT 0.3 12/24/2019   ALKPHOS 59 12/24/2019   AST 24 12/24/2019   ALT 29 12/24/2019   PROT 7.0 12/24/2019   ALBUMIN 4.1 12/24/2019   CALCIUM 9.3 12/24/2019   Lab Results  Component Value Date   CHOL 186 12/24/2019   Lab Results  Component Value Date   HDL 39 (L) 12/24/2019   Lab Results  Component Value Date   LDLCALC 122 (H) 12/24/2019   Lab Results  Component Value Date   TRIG 139 12/24/2019   Lab Results  Component Value Date   CHOLHDL 4.8 (H) 12/24/2019   Lab Results  Component Value Date   HGBA1C 7.9 (H) 12/24/2019       Assessment & Plan:   Problem List Items Addressed This Visit    None    Visit Diagnoses    UTI symptoms    -  Primary   Relevant Orders   Urinalysis, Complete   Urine Culture       Meds ordered this encounter  Medications  . cefTRIAXone (ROCEPHIN) injection 1 g  . cefpodoxime (VANTIN) 200 MG tablet    Sig: Take 1 tablet (200 mg total) by mouth 2 (two) times daily.    Dispense:  20 tablet    Refill:  0    Order Specific Question:   Supervising Provider    Answer:   Caryl Pina A [[id number]]     Ivy Lynn, NP

## 2020-01-27 NOTE — Assessment & Plan Note (Signed)
Patient presents with pyelonephritis.  Right CVA tenderness, nausea, vomiting, pain 9/10 from a pain scale of 0-10.  Symptoms started 3 days ago.  Patient was in the emergency department, imaging completed for liver, gallbladder and bile duct, pancreas, spleen, adrenal glands, kidneys and ureters, stomach, bowel, appendix, vasculature, lymph nodes, urinary bladder, reproductive, bones/joints, soft tissue.  Impression-no acute abnormality identified in the abdomen or pelvis.  Diffuse hepatic cysts steatosis and borderline to mild hepatomegaly.  Normal appendix identified, although it is relatively long and mostly located quite high along the right lateral aspect of the midportion of the right lobe of the liver, between the liver and the lateral abdominal wall.  No bowel obstruction or abnormal bowel wall thickening.  Urinalysis completed-negative nitrites, ketones, bili and glucose, slight leukocytes and blood detected.,  Moderate bacteria and mucus threads.  Treated patient with 1 g Rocephin, Cefpodoxime 200 mg Rx sent to pharmacy, increase hydration, Tylenol for fever, advised patient to go to the emergency room with unresolved or worsening symptoms of fever and increased pain.

## 2020-01-27 NOTE — Patient Instructions (Addendum)
If pain not controlled or worsening, or unresolved fever please go to the emergency department to reassess appendicitis.  Treating for pyelonephritis.  1 g Rocephin given in clinic, antibiotic Rx sent to pharmacy.  Increase hydration, Tylenol for fever    Pyelonephritis, Adult  Pyelonephritis is an infection that occurs in the kidney. The kidneys are organs that help clean the blood by moving waste out of the blood and into the pee (urine). This infection can happen quickly, or it can last for a long time. In most cases, it clears up with treatment and does not cause other problems. What are the causes? This condition may be caused by:  Germs (bacteria) going from the bladder up to the kidney. This may happen after having a bladder infection.  Germs going from the blood to the kidney. What increases the risk? This condition is more likely to develop in:  Pregnant women.  Older people.  People who have any of these conditions: ? Diabetes. ? Inflammation of the prostate gland (prostatitis), in males. ? Kidney stones or bladder stones. ? Other problems with the kidney or the parts of your body that carry pee from the kidneys to the bladder (ureters). ? Cancer.  People who have a small, thin tube (catheter) placed in the bladder.  People who are sexually active.  Women who use a medicine that kills sperm (spermicide) to prevent pregnancy.  People who have had a prior urinary tract infection (UTI). What are the signs or symptoms? Symptoms of this condition include:  Peeing often.  A strong urge to pee right away.  Burning or stinging when peeing.  Belly pain.  Back pain.  Pain in the side (flank area).  Fever or chills.  Blood in the pee, or dark pee.  Feeling sick to your stomach (nauseous) or throwing up (vomiting). How is this treated? This condition may be treated by:  Taking antibiotic medicines by mouth (orally).  Drinking enough fluids. If the infection  is bad, you may need to stay in the hospital. You may be given antibiotics and fluids that are put directly into a vein through an IV tube. In some cases, other treatments may be needed. Follow these instructions at home: Medicines  Take your antibiotic medicine as told by your doctor. Do not stop taking the antibiotic even if you start to feel better.  Take over-the-counter and prescription medicines only as told by your doctor. General instructions   Drink enough fluid to keep your pee pale yellow.  Avoid caffeine, tea, and carbonated drinks.  Pee (urinate) often. Avoid holding in pee for long periods of time.  Pee before and after sex.  After pooping (having a bowel movement), women should wipe from front to back. Use each tissue only once.  Keep all follow-up visits as told by your doctor. This is important. Contact a doctor if:  You do not feel better after 2 days.  Your symptoms get worse.  You have a fever. Get help right away if:  You cannot take your medicine or drink fluids as told.  You have chills and shaking.  You throw up.  You have very bad pain in your side or back.  You feel very weak or you pass out (faint). Summary  Pyelonephritis is an infection that occurs in the kidney.  In most cases, this infection clears up with treatment and does not cause other problems.  Take your antibiotic medicine as told by your doctor. Do not stop taking the  antibiotic even if you start to feel better.  Drink enough fluid to keep your pee pale yellow. This information is not intended to replace advice given to you by your health care provider. Make sure you discuss any questions you have with your health care provider. Document Revised: 12/16/2017 Document Reviewed: 12/16/2017 Elsevier Patient Education  2020 [employer].

## 2020-01-28 ENCOUNTER — Other Ambulatory Visit: Payer: Self-pay | Admitting: *Deleted

## 2020-01-30 ENCOUNTER — Encounter: Payer: Self-pay | Admitting: Nurse Practitioner

## 2020-01-30 LAB — URINE CULTURE

## 2020-01-31 ENCOUNTER — Other Ambulatory Visit: Payer: Self-pay | Admitting: Nurse Practitioner

## 2020-01-31 MED ORDER — AMOXICILLIN-POT CLAVULANATE 875-125 MG PO TABS: 1.0000 | ORAL_TABLET | Freq: Two times a day (BID) | ORAL | 0 refills | Status: DC

## 2020-02-08 LAB — HM DIABETES EYE EXAM

## 2020-02-14 ENCOUNTER — Telehealth: Payer: Self-pay

## 2020-02-14 NOTE — Telephone Encounter (Signed)
Vanessa [name] (Key: [id number]) Nurtec 75MG  dispersible tablets   Form [employer] Part D Electronic Prior Authorization Form (2017 [place]CP[place])

## 2020-02-15 NOTE — Telephone Encounter (Signed)
Request Reference Number: [id number]. NURTEC TAB 75MG  ODT is approved through 02/24/2021. Your patient may now fill this prescription and it will be covered.   [facility] [place], [place]  Called and aware

## 2020-02-17 ENCOUNTER — Ambulatory Visit: Payer: Medicare Other | Admitting: Adult Health

## 2020-02-21 ENCOUNTER — Other Ambulatory Visit: Payer: Self-pay | Admitting: *Deleted

## 2020-02-21 DIAGNOSIS — J302 Other seasonal allergic rhinitis: Secondary | ICD-10-CM

## 2020-02-21 MED ORDER — MONTELUKAST SODIUM 10 MG PO TABS: 10.0000 mg | ORAL_TABLET | Freq: Every day | ORAL | 1 refills | Status: DC

## 2020-03-06 ENCOUNTER — Other Ambulatory Visit: Payer: Self-pay | Admitting: *Deleted

## 2020-03-06 MED ORDER — GLUCOSE BLOOD VI STRP: ORAL_STRIP | 3 refills | Status: DC

## 2020-03-07 ENCOUNTER — Other Ambulatory Visit: Payer: Self-pay

## 2020-03-07 ENCOUNTER — Ambulatory Visit: Payer: Medicare Other | Admitting: Adult Health

## 2020-03-19 ENCOUNTER — Telehealth: Payer: Medicare Other | Admitting: Family

## 2020-03-19 DIAGNOSIS — R059 Cough, unspecified: Secondary | ICD-10-CM

## 2020-03-19 MED ORDER — PREDNISONE 10 MG (21) PO TBPK: ORAL_TABLET | ORAL | 0 refills | Status: DC

## 2020-03-19 MED ORDER — PROMETHAZINE-DM 6.25-15 MG/5ML PO SYRP: 5.0000 mL | ORAL_SOLUTION | Freq: Four times a day (QID) | ORAL | 0 refills | Status: DC | PRN

## 2020-03-19 NOTE — Progress Notes (Signed)
We are sorry that you are not feeling well.  Here is how we plan to help!  Based on your presentation I believe you most likely have A cough due to a virus.  This is called viral bronchitis and is best treated by rest, plenty of fluids and control of the cough.  You may use Ibuprofen or Tylenol as directed to help your symptoms.     In addition you may use Promethazine DM that I have sent to the pharmacy to help your cough.  Prednisone 10 mg daily for 6 days (see taper instructions below)  Directions for 6 day taper: Day 1: 2 tablets before breakfast, 1 after both lunch & dinner and 2 at bedtime Day 2: 1 tab before breakfast, 1 after both lunch & dinner and 2 at bedtime Day 3: 1 tab at each meal & 1 at bedtime Day 4: 1 tab at breakfast, 1 at lunch, 1 at bedtime Day 5: 1 tab at breakfast & 1 tab at bedtime Day 6: 1 tab at breakfast   From your responses in the eVisit questionnaire you describe inflammation in the upper respiratory tract which is causing a significant cough.  This is commonly called Bronchitis and has four common causes:    Allergies  Viral Infections  Acid Reflux  Bacterial Infection Allergies, viruses and acid reflux are treated by controlling symptoms or eliminating the cause. An example might be a cough caused by taking certain blood pressure medications. You stop the cough by changing the medication. Another example might be a cough caused by acid reflux. Controlling the reflux helps control the cough.  USE OF BRONCHODILATOR ("RESCUE") INHALERS: There is a risk from using your bronchodilator too frequently.  The risk is that over-reliance on a medication which only relaxes the muscles surrounding the breathing tubes can reduce the effectiveness of medications prescribed to reduce swelling and congestion of the tubes themselves.  Although you feel brief relief from the bronchodilator inhaler, your asthma may actually be worsening with the tubes becoming more swollen  and filled with mucus.  This can delay other crucial treatments, such as oral steroid medications. If you need to use a bronchodilator inhaler daily, several times per day, you should discuss this with your provider.  There are probably better treatments that could be used to keep your asthma under control.     HOME CARE . Only take medications as instructed by your medical team. . Complete the entire course of an antibiotic. . Drink plenty of fluids and get plenty of rest. . Avoid close contacts especially the very young and the elderly . Cover your mouth if you cough or cough into your sleeve. . Always remember to wash your hands . A steam or ultrasonic humidifier can help congestion.   GET HELP RIGHT AWAY IF: . You develop worsening fever. . You become short of breath . You cough up blood. . Your symptoms persist after you have completed your treatment plan MAKE SURE YOU   Understand these instructions.  Will watch your condition.  Will get help right away if you are not doing well or get worse.  Your e-visit answers were reviewed by a board certified advanced clinical practitioner to complete your personal care plan.  Depending on the condition, your plan could have included both over the counter or prescription medications. If there is a problem please reply  once you have received a response from your provider. Your safety is important to [place].  If you  have drug allergies check your prescription carefully.    You can use MyChart to ask questions about today's visit, request a non-urgent call back, or ask for a work or school excuse for 24 hours related to this e-Visit. If it has been greater than 24 hours you will need to follow up with your provider, or enter a new e-Visit to address those concerns. You will get an e-mail in the next two days asking about your experience.  I hope that your e-visit has been valuable and will speed your recovery. Thank you for using  e-visits.  Greater than 5 minutes, yet less than 10 minutes of time have been spent researching, coordinating, and implementing care for this patient today.  Thank you for the details you included in the comment boxes. Those details are very helpful in determining the best course of treatment for you and help [place] to provide the best care.

## 2020-03-28 ENCOUNTER — Ambulatory Visit: Payer: Medicare Other | Admitting: Family Medicine

## 2020-04-04 ENCOUNTER — Other Ambulatory Visit: Payer: Self-pay | Admitting: Family Medicine

## 2020-04-04 DIAGNOSIS — G43809 Other migraine, not intractable, without status migrainosus: Secondary | ICD-10-CM

## 2020-04-06 ENCOUNTER — Other Ambulatory Visit: Payer: Self-pay

## 2020-04-06 ENCOUNTER — Encounter: Payer: Self-pay | Admitting: Family Medicine

## 2020-04-06 ENCOUNTER — Ambulatory Visit (INDEPENDENT_AMBULATORY_CARE_PROVIDER_SITE_OTHER): Payer: Medicare Other | Admitting: Family Medicine

## 2020-04-06 VITALS — BP 138/76 | HR 104 | Temp 98.1°F | Ht 70.0 in | Wt 353.8 lb

## 2020-04-06 DIAGNOSIS — E1169 Type 2 diabetes mellitus with other specified complication: Secondary | ICD-10-CM

## 2020-04-06 DIAGNOSIS — Z6841 Body Mass Index (BMI) 40.0 and over, adult: Secondary | ICD-10-CM

## 2020-04-06 DIAGNOSIS — E785 Hyperlipidemia, unspecified: Secondary | ICD-10-CM

## 2020-04-06 DIAGNOSIS — I10 Essential (primary) hypertension: Secondary | ICD-10-CM | POA: Diagnosis not present

## 2020-04-06 DIAGNOSIS — E119 Type 2 diabetes mellitus without complications: Secondary | ICD-10-CM | POA: Diagnosis not present

## 2020-04-06 DIAGNOSIS — M170 Bilateral primary osteoarthritis of knee: Secondary | ICD-10-CM

## 2020-04-06 DIAGNOSIS — Z79899 Other long term (current) drug therapy: Secondary | ICD-10-CM | POA: Insufficient documentation

## 2020-04-06 LAB — BAYER DCA HB A1C WAIVED: HB A1C (BAYER DCA - WAIVED): 6.9 % (ref <7.0)

## 2020-04-06 MED ORDER — LISINOPRIL 40 MG PO TABS
40.0000 mg | ORAL_TABLET | Freq: Every day | ORAL | 1 refills | Status: DC
Start: 2020-04-06 — End: 2020-08-31

## 2020-04-06 MED ORDER — OXYCODONE HCL 10 MG PO TABS
10.0000 mg | ORAL_TABLET | Freq: Two times a day (BID) | ORAL | 0 refills | Status: DC | PRN
Start: 2020-04-06 — End: 2020-07-04

## 2020-04-06 MED ORDER — ATORVASTATIN CALCIUM 40 MG PO TABS
40.0000 mg | ORAL_TABLET | Freq: Every day | ORAL | 1 refills | Status: DC
Start: 2020-04-06 — End: 2020-07-06

## 2020-04-06 MED ORDER — OXYCODONE HCL 10 MG PO TABS: 10.0000 mg | ORAL_TABLET | Freq: Two times a day (BID) | ORAL | 0 refills | Status: DC | PRN

## 2020-04-06 MED ORDER — SPIRONOLACTONE 50 MG PO TABS
50.0000 mg | ORAL_TABLET | Freq: Every day | ORAL | 1 refills | Status: DC
Start: 2020-04-06 — End: 2020-07-04

## 2020-04-06 NOTE — Progress Notes (Signed)
Assessment & Plan:  1. Diabetes mellitus without complication ([place]) Lab Results  Component Value Date   HGBA1C 6.9 04/06/2020   HGBA1C 7.9 (H) 12/24/2019   HGBA1C 6.9 07/06/2019    - Diabetes is at goal of A1c < 7. - Medications: continue current medications - Home glucose monitoring: Continue monitoring - Patient is currently taking a statin. Patient is taking an ACE-inhibitor/ARB.   Diabetes Health Maintenance Due  Topic Date Due  . HEMOGLOBIN A1C  10/04/2020  . OPHTHALMOLOGY EXAM  02/07/2021  . FOOT EXAM  04/06/2021    Lab Results  Component Value Date   LABMICR See below: 01/27/2020   LABMICR 19.5 07/06/2019   - Lipid panel - CBC with Differential/Platelet - CMP14+EGFR - Bayer DCA Hb A1c Waived - atorvastatin (LIPITOR) 40 MG tablet; Take 1 tablet (40 mg total) by mouth daily.  Dispense: 90 tablet; Refill: 1 - lisinopril (ZESTRIL) 40 MG tablet; Take 1 tablet (40 mg total) by mouth daily.  Dispense: 90 tablet; Refill: 1  2. Hyperlipidemia associated with type 2 diabetes mellitus ([place]) Labs to assess. - Lipid panel - CMP14+EGFR - atorvastatin (LIPITOR) 40 MG tablet; Take 1 tablet (40 mg total) by mouth daily.  Dispense: 90 tablet; Refill: 1  3. Essential hypertension Well controlled on current regimen.  - Lipid panel - CBC with Differential/Platelet - CMP14+EGFR - lisinopril (ZESTRIL) 40 MG tablet; Take 1 tablet (40 mg total) by mouth daily.  Dispense: 90 tablet; Refill: 1 - spironolactone (ALDACTONE) 50 MG tablet; Take 1 tablet (50 mg total) by mouth daily.  Dispense: 90 tablet; Refill: 1  4-5. Bilateral primary osteoarthritis of knee/Controlled substance agreement signed Well controlled on current regimen.  Controlled substance agreement in place.  Urine drug screen is up-to-date and as expected.  PDMP reviewed with no concerning findings. - CMP14+EGFR - Oxycodone HCl 10 MG TABS; Take 1 tablet (10 mg total) by mouth 2 (two) times daily as needed.  Dispense: 60  tablet; Refill: 0 - Oxycodone HCl 10 MG TABS; Take 1 tablet (10 mg total) by mouth 2 (two) times daily as needed.  Dispense: 60 tablet; Refill: 0 - Oxycodone HCl 10 MG TABS; Take 1 tablet (10 mg total) by mouth 2 (two) times daily as needed.  Dispense: 60 tablet; Refill: 0  6. Morbid obesity with BMI of 50.0-59.9, adult [facility]) Patient is working on diet and exercise.  She was congratulated today as she has lost 15 pounds in the last 3 months. - CMP14+EGFR   Return in about 3 months (around 07/04/2020) for follow-up of chronic medication conditions.  Hendricks Limes, MSN, APRN, FNP-C [facility]  Subjective:    Patient ID: Vanessa [name], female    DOB: [birth year], 45 y.o.   MRN: [id number]  Patient Care Team: Loman [place], FNP as PCP - General (Family Medicine)   Chief Complaint:  Chief Complaint  Patient presents with  . Diabetes  . Hypertension    Check up of chronic medical conditions    HPI: Vanessa [name] is a 45 y.o. female presenting on 04/06/2020 for Diabetes and Hypertension (Check up of chronic medical conditions)  Pain assessment: Cause of pain- osteoarthritis Pain location- ankles, knees, and sometimes her back Pain on scale of 1-10- 7-8/10 without medication, 1-2/10 with medication Frequency- daily What increases pain- walking too much and heavy lifting What makes pain better- medication, bring her legs up, and ice Effects on ADL- sometimes she is unable to bend the knee and is unable  to shower at that time.  She has to wait till her pain medicine kicks in before she can take a shower. Any change in general medical condition- none  Current opioids rx- Oxycodone 10 mg BID PRN # meds rx- 60 Effectiveness of current meds- effective Adverse reactions from pain meds- none Morphine equivalent- 30 MME/day  Pill count performed-No Last drug screen - 07/06/2019 ( high risk q13m moderate risk q664mlow risk yearly ) Urine drug screen today-  No [place] [facility] [place]- Yes  If yes were their any concerning findings? - No  Overdose risk: 310  Opioid Risk  04/06/2020  Alcohol 0  Illegal Drugs 0  Rx Drugs 0  Alcohol 0  Illegal Drugs 0  Rx Drugs 0  Age between 16-44 years  1  History of Preadolescent Sexual Abuse 0  Psychological Disease 0  Depression 0  Opioid Risk Tool Scoring 1  Opioid Risk Interpretation Low Risk   Pain contract signed on: 07/06/2019  Diabetes: Patient presents for follow up of diabetes. Current symptoms include: none. Known diabetic complications: none. Medication compliance: yes. Current diet: in general, a "healthy" diet  . Current exercise: walking and weightlifting. Home blood sugar records: BGs are running  consistent with Hgb A1C. Is she  on ACE inhibitor or angiotensin II receptor blocker? Yes. Is she on a statin? Yes.    New complaints: None  Social history:  Relevant past medical, surgical, family and social history reviewed and updated as indicated. Interim medical history since our last visit reviewed.  Allergies and medications reviewed and updated.  DATA REVIEWED: CHART IN EPIC  ROS: Negative unless specifically indicated above in HPI.    Current Outpatient Medications:  .  acetaZOLAMIDE (DIAMOX) 250 MG tablet, Take 2 tablets by mouth twice daily., Disp: 360 tablet, Rfl: 1 .  amitriptyline (ELAVIL) 50 MG tablet, Take 1 tablet by mouth daily., Disp: 90 tablet, Rfl: 0 .  atorvastatin (LIPITOR) 40 MG tablet, Take 1 tablet (40 mg total) by mouth daily., Disp: 90 tablet, Rfl: 0 .  cyclobenzaprine (FLEXERIL) 10 MG tablet, TAKE 1 TABLET BY MOUTH THREE TIMES A DAY AS NEEDED FOR MUSCLE SPASMS, Disp: 60 tablet, Rfl: 2 .  FLUoxetine (PROZAC) 20 MG capsule, Take 1 capsule (20 mg total) by mouth every evening., Disp: 30 capsule, Rfl: 11 .  furosemide (LASIX) 40 MG tablet, Take 1 tablet (40 mg total) by mouth daily., Disp: 90 tablet, Rfl: 3 .  Galcanezumab-gnlm (EMGALITY) 120 MG/ML SOAJ, Inject  120 mg into the skin every 30 (thirty) days., Disp: 1.12 mL, Rfl: 11 .  glucose blood (ONE TOUCH ULTRA TEST) test strip, Test sugar daily Dx E11.9, Disp: 100 each, Rfl: 3 .  halobetasol (ULTRAVATE) 0.05 % cream, Apply topically 2 (two) times daily., Disp: 50 g, Rfl: 5 .  lisinopril (ZESTRIL) 40 MG tablet, Take 1 tablet (40 mg total) by mouth daily., Disp: 90 tablet, Rfl: 0 .  loratadine (CLARITIN) 10 MG tablet, Take 1 tablet by mouth daily., Disp: 90 tablet, Rfl: 1 .  montelukast (SINGULAIR) 10 MG tablet, Take 1 tablet (10 mg total) by mouth at bedtime., Disp: 90 tablet, Rfl: 1 .  naproxen (NAPROSYN) 500 MG tablet, Take 500 mg by mouth 2 (two) times daily., Disp: , Rfl:  .  OneTouch Delica Lancets 3094BISC, 1 each by Does not apply route daily. Dx E11.9, Disp: 100 each, Rfl: 3 .  Oxycodone HCl 10 MG TABS, Take 1 tablet (10 mg total) by mouth 2 (  two) times daily as needed., Disp: 60 tablet, Rfl: 0 .  Oxycodone HCl 10 MG TABS, Take 1 tablet (10 mg total) by mouth 2 (two) times daily as needed., Disp: 60 tablet, Rfl: 0 .  Oxycodone HCl 10 MG TABS, Take 1 tablet (10 mg total) by mouth 2 (two) times daily as needed., Disp: 60 tablet, Rfl: 0 .  promethazine-dextromethorphan (PROMETHAZINE-DM) 6.25-15 MG/5ML syrup, Take 5 mLs by mouth 4 (four) times daily as needed., Disp: 118 mL, Rfl: 0 .  Rimegepant Sulfate (NURTEC) 75 MG TBDP, Take 75 mg by mouth daily as needed., Disp: 10 tablet, Rfl: 2 .  Semaglutide, 1 MG/DOSE, (OZEMPIC, 1 MG/DOSE,) 2 MG/1.5ML SOPN, Inject 1 mg into the skin once a week., Disp: 3 mL, Rfl: 12 .  spironolactone (ALDACTONE) 50 MG tablet, Take 1 tablet (50 mg total) by mouth daily., Disp: 90 tablet, Rfl: 1 .  zaleplon (SONATA) 5 MG capsule, Take 1 capsule (5 mg total) by mouth at bedtime as needed for sleep. Take 5 mg by mouth at bedtime as needed for sleep., Disp: 30 capsule, Rfl: 0   Allergies  Allergen Reactions  . Metrizamide Other (See Comments), Itching, Photosensitivity,  Shortness Of Breath and Swelling    update Migraine instantly  . Mushroom Extract Complex Itching    Throat itching with cough  . Peanut Oil Anaphylaxis and Other (See Comments)    update  . Peanut-Containing Drug Products Itching    Itching throat with cough  . Diflucan In Dextrose [Fluconazole In Dextrose] Hives  . Iodine-131 Other (See Comments)    update   Past Medical History:  Diagnosis Date  . Hypertension   . Migraines   . Pseudotumor (inflammatory) of orbit   . Sepsis ([place]) 02/2019  . Vision loss     Past Surgical History:  Procedure Laterality Date  . CSF SHUNT      Social History   Socioeconomic History  . Marital status: Single    Spouse name: Not on file  . Number of children: Not on file  . Years of education: Not on file  . Highest education level: Not on file  Occupational History  . Not on file  Tobacco Use  . Smoking status: Never Smoker  . Smokeless tobacco: Never Used  Vaping Use  . Vaping Use: Never used  Substance and Sexual Activity  . Alcohol use: No  . Drug use: No  . Sexual activity: Not on file  Other Topics Concern  . Not on file  Social History Narrative   Lives with parents   Right handed   Caffeine: maybe 2 cups/day   Social Determinants of Health   Financial Resource Strain: Not on file  Food Insecurity: Not on file  Transportation Needs: Not on file  Physical Activity: Not on file  Stress: Not on file  Social Connections: Not on file  Intimate Partner Violence: Not on file        Objective:    BP 138/76   Pulse (!) 104   Temp 98.1 F (36.7 C) (Temporal)   Ht _0  (1.778 m)   Wt (!) 353 lb 12.8 oz (160.5 kg)   SpO2 93%   BMI 50.77 kg/m   Wt Readings from Last 3 Encounters:  04/06/20 (!) 353 lb 12.8 oz (160.5 kg)  01/27/20 (!) 356 lb 8 oz (161.7 kg)  12/24/19 (!) 368 lb 4 oz (167 kg)    Physical Exam Vitals reviewed.  Constitutional:  General: She is not in acute distress.    Appearance: Normal  appearance. She is morbidly obese. She is not ill-appearing, toxic-appearing or diaphoretic.  HENT:     Head: Normocephalic and atraumatic.  Eyes:     General: No scleral icterus.       Right eye: No discharge.        Left eye: No discharge.     Conjunctiva/sclera: Conjunctivae normal.  Cardiovascular:     Rate and Rhythm: Normal rate and regular rhythm.     Heart sounds: Normal heart sounds. No murmur heard. No friction rub. No gallop.   Pulmonary:     Effort: Pulmonary effort is normal. No respiratory distress.     Breath sounds: Normal breath sounds. No stridor. No wheezing, rhonchi or rales.  Musculoskeletal:        General: Normal range of motion.     Cervical back: Normal range of motion.  Skin:    General: Skin is warm and dry.     Capillary Refill: Capillary refill takes less than 2 seconds.  Neurological:     General: No focal deficit present.     Mental Status: She is alert and oriented to person, place, and time. Mental status is at baseline.  Psychiatric:        Mood and Affect: Mood normal.        Behavior: Behavior normal.        Thought Content: Thought content normal.        Judgment: Judgment normal.    Diabetic Foot Exam - Simple   Simple Foot Form Diabetic Foot exam was performed with the following findings: Yes 04/06/2020  1:58 PM  Visual Inspection No deformities, no ulcerations, no other skin breakdown bilaterally: Yes See comments: Yes Sensation Testing Intact to touch and monofilament testing bilaterally: Yes Pulse Check Posterior Tibialis and Dorsalis pulse intact bilaterally: Yes Comments She does have very dry skin and calluses on both feet.      No results found for: TSH Lab Results  Component Value Date   WBC 6.3 12/24/2019   HGB 12.0 12/24/2019   HCT 38.0 12/24/2019   MCV 84 12/24/2019   PLT 202 12/24/2019   Lab Results  Component Value Date   NA 140 12/24/2019   K 4.2 12/24/2019   CO2 22 12/24/2019   GLUCOSE 184 (H) 12/24/2019    BUN 7 12/24/2019   CREATININE 0.78 12/24/2019   BILITOT 0.3 12/24/2019   ALKPHOS 59 12/24/2019   AST 24 12/24/2019   ALT 29 12/24/2019   PROT 7.0 12/24/2019   ALBUMIN 4.1 12/24/2019   CALCIUM 9.3 12/24/2019   Lab Results  Component Value Date   CHOL 186 12/24/2019   Lab Results  Component Value Date   HDL 39 (L) 12/24/2019   Lab Results  Component Value Date   LDLCALC 122 (H) 12/24/2019   Lab Results  Component Value Date   TRIG 139 12/24/2019   Lab Results  Component Value Date   CHOLHDL 4.8 (H) 12/24/2019   Lab Results  Component Value Date   HGBA1C 7.9 (H) 12/24/2019

## 2020-04-07 LAB — CMP14+EGFR
ALT: 22 IU/L (ref 0–32)
AST: 19 IU/L (ref 0–40)
Albumin/Globulin Ratio: 1.2 (ref 1.2–2.2)
Albumin: 3.9 g/dL (ref 3.8–4.8)
Alkaline Phosphatase: 60 IU/L (ref 44–121)
BUN/Creatinine Ratio: 11 (ref 9–23)
BUN: 10 mg/dL (ref 6–24)
Bilirubin Total: 0.4 mg/dL (ref 0.0–1.2)
CO2: 22 mmol/L (ref 20–29)
Calcium: 9 mg/dL (ref 8.7–10.2)
Chloride: 102 mmol/L (ref 96–106)
Creatinine, Ser: 0.9 mg/dL (ref 0.57–1.00)
GFR calc Af Amer: 91 mL/min/{1.73_m2} (ref >59)
GFR calc non Af Amer: 79 mL/min/{1.73_m2} (ref >59)
Globulin, Total: 3.2 g/dL (ref 1.5–4.5)
Glucose: 203 mg/dL — ABNORMAL HIGH (ref 65–99)
Potassium: 4 mmol/L (ref 3.5–5.2)
Sodium: 138 mmol/L (ref 134–144)
Total Protein: 7.1 g/dL (ref 6.0–8.5)

## 2020-04-07 LAB — LIPID PANEL
Chol/HDL Ratio: 4.9 ratio — ABNORMAL HIGH (ref 0.0–4.4)
Cholesterol, Total: 168 mg/dL (ref 100–199)
HDL: 34 mg/dL — ABNORMAL LOW (ref >39)
LDL Chol Calc (NIH): 95 mg/dL (ref 0–99)
Triglycerides: 229 mg/dL — ABNORMAL HIGH (ref 0–149)
VLDL Cholesterol Cal: 39 mg/dL (ref 5–40)

## 2020-04-07 LAB — CBC WITH DIFFERENTIAL/PLATELET
Basophils Absolute: 0 10*3/uL (ref 0.0–0.2)
Basos: 0 %
EOS (ABSOLUTE): 0.2 10*3/uL (ref 0.0–0.4)
Eos: 2 %
Hematocrit: 37.3 % (ref 34.0–46.6)
Hemoglobin: 12.2 g/dL (ref 11.1–15.9)
Immature Grans (Abs): 0 10*3/uL (ref 0.0–0.1)
Immature Granulocytes: 0 %
Lymphocytes Absolute: 2.9 10*3/uL (ref 0.7–3.1)
Lymphs: 29 %
MCH: 26.6 pg (ref 26.6–33.0)
MCHC: 32.7 g/dL (ref 31.5–35.7)
MCV: 81 fL (ref 79–97)
Monocytes Absolute: 0.4 10*3/uL (ref 0.1–0.9)
Monocytes: 4 %
Neutrophils Absolute: 6.4 10*3/uL (ref 1.4–7.0)
Neutrophils: 65 %
Platelets: 208 10*3/uL (ref 150–450)
RBC: 4.59 x10E6/uL (ref 3.77–5.28)
RDW: 13.6 % (ref 11.7–15.4)
WBC: 9.9 10*3/uL (ref 3.4–10.8)

## 2020-05-16 ENCOUNTER — Ambulatory Visit: Payer: Medicare Other | Admitting: Adult Health

## 2020-05-24 NOTE — Patient Instructions (Addendum)
Below is our plan:  We will switch Emgality to [place]ovy. I will discuss adding Diamox with Dr Jaynee Eagles if you are not doing better in about 4 weeks. Please call for any worsening. Continue Nurtec for abortive therapy.   Please make sure you are staying well hydrated. I recommend 50-60 ounces daily. Well balanced diet and regular exercise encouraged. Consistent sleep schedule with 6-8 hours recommended.   Please continue follow up with care team as directed.   Follow up with [place] in 6 months   You may receive a survey regarding today's visit. I encourage you to leave honest feed back as I do use this information to improve patient care. Thank you for seeing me today!     Idiopathic Intracranial Hypertension  Idiopathic intracranial hypertension (IIH) is a condition that increases pressure around the brain. The fluid that surrounds the brain and spinal cord (cerebrospinal fluid, or CSF) increases and causes the pressure. Idiopathic means that the cause of this condition is not known. IIH affects the brain and spinal cord (neurological disorder). If this condition is not treated, it can cause vision loss or blindness. What are the causes? The cause of this condition is not known. What increases the risk? The following factors may make you more likely to develop this condition:  Being very overweight (obese).  Being a female between the ages of 42 and 12 years old, who has not gone through menopause.  Taking certain medicines, such as birth control or steroids. What are the signs or symptoms? Symptoms of this condition include:  Headaches. This is the most common symptom.  Brief episodes of total blindness.  Double vision, blurred vision, or poor side (peripheral) vision.  Pain in the shoulders or neck.  Nausea and vomiting.  A sound like rushing water or a pulsing sound within the ears (pulsatile tinnitus), or ringing in the ears. How is this diagnosed? This condition may be diagnosed  based on:  Your symptoms and medical history.  Imaging tests of the brain, such as: ? CT scan. ? MRI. ? Magnetic resonance venogram (MRV) to check the veins.  Diagnostic lumbar puncture. This is a procedure to remove and examine a sample of cerebrospinal fluid. This procedure can determine whether too much fluid may be causing IIH.  A thorough eye exam to check for swelling or nerve damage in the eyes. How is this treated? Treatment for this condition depends on the symptoms. The goal of treatment is to decrease the pressure around your brain. Common treatments include:  Weight loss through healthy eating, salt restriction, and exercise, if you are overweight.  Medicines to decrease the production of spinal fluid and lower the pressure within your skull.  Medicines to prevent or treat headaches. Other treatments may include:  Surgery to place drains (shunts) in your brain for removing excess fluid.  Lumbar puncture to remove excess cerebrospinal fluid. Follow these instructions at home:  If you are overweight or obese, work with your health care provider to lose weight.  Take over-the-counter and prescription medicines only as told by your health care provider.  Ask your health care provider if the medicine prescribed to you requires you to avoid driving or using machinery.  Do not use any products that contain nicotine or tobacco, such as cigarettes, e-cigarettes, and chewing tobacco. If you need help quitting, ask your health care provider.  Keep all follow-up visits as told by your health care provider. This is important. Contact a health care provider if: You  have changes in your vision, such as:  Double vision.  Blurred vision.  Poor peripheral vision. Get help right away if: You have any of the following symptoms and they get worse or do not get better:  Headaches.  Nausea.  Vomiting.  Sudden trouble seeing. Summary  Idiopathic intracranial hypertension  (IIH) is a condition that increases pressure around the brain. The cause is not known (is idiopathic).  The most common symptom of IIH is headaches. Vision changes, pain in the shoulders or neck, nausea, and vomiting may also occur.  Treatment for this condition depends on your symptoms. The goal of treatment is to decrease the pressure around your brain.  If you are overweight or obese, work with your health care provider to lose weight.  Take over-the-counter and prescription medicines only as told by your health care provider. This information is not intended to replace advice given to you by your health care provider. Make sure you discuss any questions you have with your health care provider. Document Revised: 01/23/2019 Document Reviewed: 01/23/2019 Elsevier Patient Education  2021 [employer].   Migraine Headache A migraine headache is a very strong throbbing pain on one side or both sides of your head. This type of headache can also cause other symptoms. It can last from 4 hours to 3 days. Talk with your doctor about what things may bring on (trigger) this condition. What are the causes? The exact cause of this condition is not known. This condition may be triggered or caused by:  Drinking alcohol.  Smoking.  Taking medicines, such as: ? Medicine used to treat chest pain (nitroglycerin). ? Birth control pills. ? Estrogen. ? Some blood pressure medicines.  Eating or drinking certain products.  Doing physical activity. Other things that may trigger a migraine headache include:  Having a menstrual period.  Pregnancy.  Hunger.  Stress.  Not getting enough sleep or getting too much sleep.  Weather changes.  Tiredness (fatigue). What increases the risk?  Being 88-93 years old.  Being female.  Having a family history of migraine headaches.  Being Caucasian.  Having depression or anxiety.  Being very overweight. What are the signs or symptoms?  A  throbbing pain. This pain may: ? Happen in any area of the head, such as on one side or both sides. ? Make it hard to do daily activities. ? Get worse with physical activity. ? Get worse around bright lights or loud noises.  Other symptoms may include: ? Feeling sick to your stomach (nauseous). ? Vomiting. ? Dizziness. ? Being sensitive to bright lights, loud noises, or smells.  Before you get a migraine headache, you may get warning signs (an aura). An aura may include: ? Seeing flashing lights or having blind spots. ? Seeing bright spots, halos, or zigzag lines. ? Having tunnel vision or blurred vision. ? Having numbness or a tingling feeling. ? Having trouble talking. ? Having weak muscles.  Some people have symptoms after a migraine headache (postdromal phase), such as: ? Tiredness. ? Trouble thinking (concentrating). How is this treated?  Taking medicines that: ? Relieve pain. ? Relieve the feeling of being sick to your stomach. ? Prevent migraine headaches.  Treatment may also include: ? Having acupuncture. ? Avoiding foods that bring on migraine headaches. ? Learning ways to control your body functions (biofeedback). ? Therapy to help you know and deal with negative thoughts (cognitive behavioral therapy). Follow these instructions at home: Medicines  Take over-the-counter and prescription medicines only as  told by your doctor.  Ask your doctor if the medicine prescribed to you: ? Requires you to avoid driving or using heavy machinery. ? Can cause trouble pooping (constipation). You may need to take these steps to prevent or treat trouble pooping:  Drink enough fluid to keep your pee (urine) pale yellow.  Take over-the-counter or prescription medicines.  Eat foods that are high in fiber. These include beans, whole grains, and fresh fruits and vegetables.  Limit foods that are high in fat and sugar. These include fried or sweet foods. Lifestyle  Do not drink  alcohol.  Do not use any products that contain nicotine or tobacco, such as cigarettes, e-cigarettes, and chewing tobacco. If you need help quitting, ask your doctor.  Get at least 8 hours of sleep every night.  Limit and deal with stress. General instructions  Keep a journal to find out what may bring on your migraine headaches. For example, write down: ? What you eat and drink. ? How much sleep you get. ? Any change in what you eat or drink. ? Any change in your medicines.  If you have a migraine headache: ? Avoid things that make your symptoms worse, such as bright lights. ? It may help to lie down in a dark, quiet room. ? Do not drive or use heavy machinery. ? Ask your doctor what activities are safe for you.  Keep all follow-up visits as told by your doctor. This is important.      Contact a doctor if:  You get a migraine headache that is different or worse than others you have had.  You have more than 15 headache days in one month. Get help right away if:  Your migraine headache gets very bad.  Your migraine headache lasts longer than 72 hours.  You have a fever.  You have a stiff neck.  You have trouble seeing.  Your muscles feel weak or like you cannot control them.  You start to lose your balance a lot.  You start to have trouble walking.  You pass out (faint).  You have a seizure. Summary  A migraine headache is a very strong throbbing pain on one side or both sides of your head. These headaches can also cause other symptoms.  This condition may be treated with medicines and changes to your lifestyle.  Keep a journal to find out what may bring on your migraine headaches.  Contact a doctor if you get a migraine headache that is different or worse than others you have had.  Contact your doctor if you have more than 15 headache days in a month. This information is not intended to replace advice given to you by your health care provider. Make sure  you discuss any questions you have with your health care provider. Document Revised: 06/05/2018 Document Reviewed: 03/26/2018 Elsevier Patient Education  [employer].

## 2020-05-24 NOTE — Progress Notes (Addendum)
Chief Complaint  Patient presents with  . Follow-up    RM 1 alone Pt has had a headache constantly everyday for a month now.Last 2 weeks have been everyday, all day and pressure has became intense. Causing blurred vision and nausea      HISTORY OF PRESENT ILLNESS: 05/25/20 ALL:  Vanessa [name] is a 45 y.o. female here today for follow up for headache follow up. 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 MRI 11/2019 was unremarkable. She was started on Emgality (Ajovy not approved).  Diamox and amitriptyline continued (prescribed by PCP). She reports that around 11/2019 she stopped Diamox due to reports of nausea. She was doing very well from 11/2019 until 04/2020. Over the past month she has noticed worsening headaches. She is having near daily dull, achy headaches in the frontal region. She is having 1-2 migraines a week. She reports smelling diesel fuel before during and after headache. She has nausea and blurred vision with headache. No vision loss in left eye. Blind in right eye. Nurtec helps most with abortive therapy.  A1C at goal. BP has been elevated. She feels that this is due to headaches. She is taking Oxycodone 10mg  PRN (1-2 times daily) for knee pain. She has constipation. She reports neuro opthalmology exam was normal with no concerns of optic nerve edema about 2 months ago.   She was seen 3/25 in the ER for classic migraine aborted with migraine cocktail and Fioricet. CT was normal, stable position of shunt and decompressed ventricles.    HISTORY (copied from Dr Cathren Laine previous note)  HPI:  Vanessa [name] is a 45 y.o. female here as requested by Loman Brooklyn, FNP for migraines.  Patient has a past medical history of IDIOPATHIC INTRACRANIAL HYPERTENSION,  congenital blindness OD, migraines, morbid obesity, hypertension, vision loss, PCOS, diabetes, hyperlipidemia, congenital blindness of right eye, OD  glaucoma. 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  She has been evaluated in the past by Dr. Jola Schmidt) as well as Dr. Octavia Bruckner Martin(neuro-Ophthalmology) and now she sees a neuro- ophthalmologist elsewhere.  Last time she saw neurosurgery was in 2019 and at that time the shunt was in good working condition. Headaches improved. She is still getting migraines however. Some days it feels like head is going to explode. She has not lost weight. She has daily headaches and migraines 2x a week. She wakes up with headaches, not using her cpap. Positional worsening headache, vision changes and blurry vision.The migraines are throbbing, she may smell finny things prior to migraine. Migraines can last 3 days, severe, at least 15 migraine days a month for over a year, light/sound sensitivity, nausea, no vomiting. Daily headaches. Congenital blindness right eye. Headaches are worse in the morning and with position. No weakness, numbness.  No other focal neurologic deficits, associated symptoms, inciting events or modifiable factors.  Last seen by neurosurgery in April 2019.  Reviewed notes, labs and imaging from outside physicians, which showed:  Reviewed imaging from January 2019 CT of the head, interval  placement of a left frontal approach VP shunt catheter, slitlike appearance of the lateral and third ventricles.  Evidence of a prior right frontal burr hole site.  XR shunt series showed left VP shunt catheter tubing coursing along the left hemithorax and into the abdomen and terminates into the left lower quadrant, no shunt kinking or discontinuity.   From a thorough review of notes patient's been on multiple medications that can be used in iih and migraine management including acetazolamide, propranolol, amitriptyline, amlodipine, Benadryl, gabapentin, lisinopril, Zofran, promethazine,topamax, naratriptan, maxalt, sumatriptan   REVIEW OF SYSTEMS: Out of a complete 14 system review of symptoms, the patient complains only of the following symptoms, headaches, chronic knee pain, and all other reviewed systems are negative.   ALLERGIES: Allergies  Allergen Reactions  . Metrizamide Other (See Comments), Itching, Photosensitivity, Shortness Of Breath and Swelling    update Migraine instantly  . Mushroom Extract Complex Itching    Throat itching with cough  . Peanut Oil Anaphylaxis and Other (See Comments)    update  . Peanut-Containing Drug Products Itching    Itching throat with cough  . Diflucan In Dextrose [Fluconazole In Dextrose] Hives  . Iodine-131 Other (See Comments)    update     HOME MEDICATIONS: Outpatient Medications Prior to Visit  Medication Sig Dispense Refill  . amitriptyline (ELAVIL) 50 MG tablet Take 1 tablet by mouth daily. 90 tablet 0  . atorvastatin (LIPITOR) 40 MG tablet Take 1 tablet (40 mg total) by mouth daily. 90 tablet 1  . cyclobenzaprine (FLEXERIL) 10 MG tablet TAKE 1 TABLET BY MOUTH THREE TIMES A DAY AS NEEDED FOR MUSCLE SPASMS 60 tablet 2  . FLUoxetine (PROZAC) 20 MG capsule Take 1 capsule (20 mg total) by mouth every evening. 30 capsule 11  . furosemide (LASIX) 40 MG tablet Take 1 tablet (40 mg total) by mouth daily. 90 tablet 3  . Galcanezumab-gnlm  (EMGALITY) 120 MG/ML SOAJ Inject 120 mg into the skin every 30 (thirty) days. 1.12 mL 11  . glucose blood (ONE TOUCH ULTRA TEST) test strip Test sugar daily Dx E11.9 100 each 3  . halobetasol (ULTRAVATE) 0.05 % cream Apply topically 2 (two) times daily. 50 g 5  . lisinopril (ZESTRIL) 40 MG tablet Take 1 tablet (40 mg total) by mouth daily. 90 tablet 1  . loratadine (CLARITIN) 10 MG tablet Take 1 tablet by mouth daily. 90 tablet 1  . montelukast (SINGULAIR) 10 MG tablet Take 1 tablet (10 mg total) by mouth at bedtime. 90 tablet 1  . naproxen (NAPROSYN) 500 MG tablet Take 500 mg by mouth 2 (two) times daily.    Glory Rosebush Delica Lancets 56E MISC 1 each by Does not apply route daily. Dx E11.9 100 each 3  . Oxycodone HCl 10 MG TABS Take 1 tablet (10 mg total) by mouth 2 (two) times daily as needed. 60 tablet 0  . Oxycodone HCl 10 MG TABS Take 1 tablet (10 mg total) by mouth 2 (two) times daily as needed. 60 tablet 0  . Oxycodone HCl 10 MG TABS Take 1 tablet (10 mg total) by mouth 2 (two) times daily as needed. 60 tablet 0  . promethazine-dextromethorphan (PROMETHAZINE-DM) 6.25-15 MG/5ML syrup Take 5 mLs by mouth 4 (  four) times daily as needed. 118 mL 0  . Rimegepant Sulfate (NURTEC) 75 MG TBDP Take 75 mg by mouth daily as needed. 10 tablet 2  . Semaglutide, 1 MG/DOSE, (OZEMPIC, 1 MG/DOSE,) 2 MG/1.5ML SOPN Inject 1 mg into the skin once a week. 3 mL 12  . spironolactone (ALDACTONE) 50 MG tablet Take 1 tablet (50 mg total) by mouth daily. 90 tablet 1  . zaleplon (SONATA) 5 MG capsule Take 1 capsule (5 mg total) by mouth at bedtime as needed for sleep. Take 5 mg by mouth at bedtime as needed for sleep. 30 capsule 0  . acetaZOLAMIDE (DIAMOX) 250 MG tablet Take 2 tablets by mouth twice daily. 360 tablet 1   No facility-administered medications prior to visit.     PAST MEDICAL HISTORY: Past Medical History:  Diagnosis Date  . Hypertension   . Migraines   . Pseudotumor (inflammatory) of orbit   .  Sepsis ([place]) 02/2019  . Vision loss      PAST SURGICAL HISTORY: Past Surgical History:  Procedure Laterality Date  . CSF SHUNT       FAMILY HISTORY: Family History  Problem Relation Age of Onset  . Pulmonary Hypertension Mother   . Congestive Heart Failure Mother   . Hypertension Sister   . Migraines Neg Hx      SOCIAL HISTORY: Social History   Socioeconomic History  . Marital status: Single    Spouse name: Not on file  . Number of children: Not on file  . Years of education: Not on file  . Highest education level: Not on file  Occupational History  . Not on file  Tobacco Use  . Smoking status: Never Smoker  . Smokeless tobacco: Never Used  Vaping Use  . Vaping Use: Never used  Substance and Sexual Activity  . Alcohol use: No  . Drug use: No  . Sexual activity: Not on file  Other Topics Concern  . Not on file  Social History Narrative   Lives with parents   Right handed   Caffeine: maybe 2 cups/day   Social Determinants of Health   Financial Resource Strain: Not on file  Food Insecurity: Not on file  Transportation Needs: Not on file  Physical Activity: Not on file  Stress: Not on file  Social Connections: Not on file  Intimate Partner Violence: Not on file      PHYSICAL EXAM  Vitals:   05/25/20 1258  BP: (!) 148/91  Pulse: 96  Weight: (!) 351 lb (159.2 kg)  Height: 5\' 10"  (1.778 m)   Body mass index is 50.36 kg/m.   Generalized: Well developed, in no acute distress  Cardiology: normal rate and rhythm, no murmur auscultated  Respiratory: clear to auscultation bilaterally    Neurological examination  Mentation: Alert oriented to time, place, history taking. Follows all commands speech and language fluent Cranial nerve II-XII: Pupils were equal round reactive to light. Extraocular movements were full of left eye, visual field were full on confrontational test. Congenital blindness of right eye, outward deviation. Facial sensation and  strength were normal. Uvula tongue midline. Head turning and shoulder shrug  were normal and symmetric. Motor: The motor testing reveals 5 over 5 strength of all 4 extremities. Good symmetric motor tone is noted throughout.  Sensory: Sensory testing is intact to soft touch on all 4 extremities. No evidence of extinction is noted.  Coordination: Cerebellar testing reveals good finger-nose-finger and heel-to-shin bilaterally.  Gait and station: Gait is normal.  Reflexes: Deep tendon reflexes are symmetric and normal bilaterally.     DIAGNOSTIC DATA (LABS, IMAGING, TESTING) - I reviewed patient records, labs, notes, testing and imaging myself where available.  Lab Results  Component Value Date   WBC 9.9 04/06/2020   HGB 12.2 04/06/2020   HCT 37.3 04/06/2020   MCV 81 04/06/2020   PLT 208 04/06/2020      Component Value Date/Time   NA 138 04/06/2020 1329   K 4.0 04/06/2020 1329   CL 102 04/06/2020 1329   CO2 22 04/06/2020 1329   GLUCOSE 203 (H) 04/06/2020 1329   BUN 10 04/06/2020 1329   CREATININE 0.90 04/06/2020 1329   CALCIUM 9.0 04/06/2020 1329   PROT 7.1 04/06/2020 1329   ALBUMIN 3.9 04/06/2020 1329   AST 19 04/06/2020 1329   ALT 22 04/06/2020 1329   ALKPHOS 60 04/06/2020 1329   BILITOT 0.4 04/06/2020 1329   GFRNONAA 79 04/06/2020 1329   GFRAA 91 04/06/2020 1329   Lab Results  Component Value Date   CHOL 168 04/06/2020   HDL 34 (L) 04/06/2020   LDLCALC 95 04/06/2020   TRIG 229 (H) 04/06/2020   CHOLHDL 4.9 (H) 04/06/2020   Lab Results  Component Value Date   HGBA1C 6.9 04/06/2020   No results found for: VITAMINB12 No results found for: TSH  No flowsheet data found.   No flowsheet data found.   ASSESSMENT AND PLAN  45 y.o. year old female  has a past medical history of Hypertension, Migraines, Pseudotumor (inflammatory) of orbit, Sepsis ([place]) (02/2019), and Vision loss. here with   Morbid obesity ([place])  IIH (idiopathic intracranial hypertension)  S/P  ventriculoperitoneal shunt  Chronic migraine without aura without status migrainosus, not intractable  Vanessa [name] was doing very well on Emgality and amitriptyline (PCP) despite discontinuation of Diamox in 11/2019. Headache frequency and intensity have worsened over the past month. She has had 4-5 migraines. We have discussed potential causes including worsening IIH, weather changes, stress, etc. Eye exam was reportedly normal 2 months ago. She is on Lasix and spironolactone. She did not feel well on Diamox or topiramate. I will switch CGRP to Ajovy. She has taken this in the past and feels it worked better than [employer]. Avoid Amovig due to constipation on opioids. We will continue Nurtec for abortive therapy. She will continue amitriptyline as prescribed by PCP. We will follow closely and consider restart of low dose Diamox for concerns of worsening tension headaches. Healthy lifestyle habits encouraged. She will monitor BP closely. She will follow up in 3-6 months. She verbalizes understanding and agreement with this plan.   No orders of the defined types were placed in this encounter.    No orders of the defined types were placed in this encounter.     I spent 30 minutes of face-to-face and non-face-to-face time with patient.  This included previsit chart review, lab review, study review, order entry, electronic health record documentation, patient education.    [name], MSN, FNP-C 05/25/2020, 1:02 PM  [facility] [address], [place],  [address] [phone] Made any corrections needed, and agree with history, physical, neuro exam,assessment and plan as stated.     Sarina Ill, MD [facility]

## 2020-05-25 ENCOUNTER — Encounter: Payer: Self-pay | Admitting: Family Medicine

## 2020-05-25 ENCOUNTER — Ambulatory Visit (INDEPENDENT_AMBULATORY_CARE_PROVIDER_SITE_OTHER): Payer: 59 | Admitting: Family Medicine

## 2020-05-25 ENCOUNTER — Other Ambulatory Visit: Payer: Self-pay | Admitting: Family Medicine

## 2020-05-25 DIAGNOSIS — G43809 Other migraine, not intractable, without status migrainosus: Secondary | ICD-10-CM

## 2020-05-25 DIAGNOSIS — G43709 Chronic migraine without aura, not intractable, without status migrainosus: Secondary | ICD-10-CM | POA: Diagnosis not present

## 2020-05-25 DIAGNOSIS — Z982 Presence of cerebrospinal fluid drainage device: Secondary | ICD-10-CM | POA: Diagnosis not present

## 2020-05-25 DIAGNOSIS — G932 Benign intracranial hypertension: Secondary | ICD-10-CM

## 2020-05-25 MED ORDER — NURTEC 75 MG PO TBDP: 75.0000 mg | ORAL_TABLET | Freq: Every day | ORAL | 2 refills | Status: DC | PRN

## 2020-05-25 MED ORDER — AJOVY 225 MG/1.5ML ~~LOC~~ SOAJ: 225.0000 mg | SUBCUTANEOUS | 3 refills | Status: DC

## 2020-06-01 ENCOUNTER — Telehealth: Payer: Self-pay | Admitting: *Deleted

## 2020-06-01 DIAGNOSIS — G43809 Other migraine, not intractable, without status migrainosus: Secondary | ICD-10-CM

## 2020-06-01 NOTE — Telephone Encounter (Signed)
Addended note. Avoid Amovig due to constipation on chronic opioids.

## 2020-06-01 NOTE — Telephone Encounter (Signed)
Initiated CMM Key for ajovy [id number].  Received denialas needs to try aimovig first or medical reason why cannot try this.

## 2020-06-01 NOTE — Telephone Encounter (Signed)
Received from pharmacy, needing PA on nurtec initiated on CMM KEY [id number] showing PA already approved 02-26-20 thru 02-24-21.  Initiated PA on Jud, determination pending.  G43.709, tried emgality, diamox propranolol, amitriptyline, gabapentin, topmax naratriptan, rizatriptan, sumatriptan, fluoetine, naproxen.

## 2020-06-01 NOTE — Telephone Encounter (Signed)
Need to do appeal, spoke to Ron at [employer].

## 2020-06-05 ENCOUNTER — Encounter: Payer: Self-pay | Admitting: *Deleted

## 2020-06-05 NOTE — Telephone Encounter (Signed)
Appeal letter faxed to [phone] for [place].  Determination pending.  (Dr. Jaynee Eagles signed as Lanice Schwab out).

## 2020-06-05 NOTE — Telephone Encounter (Signed)
Appeal letter written to Dr. Jaynee Eagles for signature as Amy NP out.

## 2020-06-08 NOTE — Telephone Encounter (Signed)
Received copy the approval and faxed to [facility] pharmacy with confirmation received.

## 2020-06-08 NOTE — Telephone Encounter (Signed)
FYI: [employer] [place]) called, Vanessa [name] has been approved. Will send out a written correspondence.   Contact info: [phone]

## 2020-06-12 MED ORDER — NURTEC 75 MG PO TBDP: 75.0000 mg | ORAL_TABLET | Freq: Every day | ORAL | 5 refills | Status: DC | PRN

## 2020-06-12 NOTE — Addendum Note (Signed)
Addended by: Brandon Melnick on: 06/12/2020 01:22 PM   Modules accepted: Orders

## 2020-06-27 ENCOUNTER — Telehealth: Payer: 59 | Admitting: Physician Assistant

## 2020-06-27 DIAGNOSIS — H60503 Unspecified acute noninfective otitis externa, bilateral: Secondary | ICD-10-CM | POA: Diagnosis not present

## 2020-06-27 MED ORDER — NEOMYCIN-POLYMYXIN-HC 3.5-10000-1 OT SOLN: 3.0000 [drp] | Freq: Four times a day (QID) | OTIC | 0 refills | Status: DC

## 2020-06-27 NOTE — Progress Notes (Signed)
E Visit for Swimmer's Ear  We are sorry that you are not feeling well. Here is how we plan to help!  I have prescribed: Neomycin 0.35%, polymyxin B 10,000 units/mL, and hydrocortisone 0,5% otic solution 4 drops in affected ears four times a day for 7 days  In certain cases swimmer's ear may progress to a more serious bacterial infection of the middle or inner ear.  If you have a fever 102 and up and significantly worsening symptoms, this could indicate a more serious infection moving to the middle/inner and needs face to face evaluation in an office by a provider.  Your symptoms should improve over the next 3 days and should resolve in about 7 days.  HOME CARE:   Wash your hands frequently.  Do not place the tip of the bottle on your ear or touch it with your fingers.  You can take Acetominophen 650 mg every 4-6 hours as needed for pain.  If pain is severe or moderate, you can apply a heating pad (set on low) or hot water bottle (wrapped in a towel) to outer ear for 20 minutes.  This will also increase drainage.  Avoid ear plugs  Do not use Q-tips  After showers, help the water run out by tilting your head to one side.  GET HELP RIGHT AWAY IF:   Fever is over 102.2 degrees.  You develop progressive ear pain or hearing loss.  Ear symptoms persist longer than 3 days after treatment.  MAKE SURE YOU:   Understand these instructions.  Will watch your condition.  Will get help right away if you are not doing well or get worse.  TO PREVENT SWIMMER'S EAR:  Use a bathing cap or custom fitted swim molds to keep your ears dry.  Towel off after swimming to dry your ears.  Tilt your head or pull your earlobes to allow the water to escape your ear canal.  If there is still water in your ears, consider using a hairdryer on the lowest setting.  Thank you for choosing an e-visit. Your e-visit answers were reviewed by a board certified advanced clinical practitioner to complete  your personal care plan. Depending upon the condition, your plan could have included both over the counter or prescription medications. Please review your pharmacy choice. Be sure that the pharmacy you have chosen is open so that you can pick up your prescription now.  If there is a problem you may message your provider in MyChart to have the prescription routed to another pharmacy. Your safety is important to us. If you have drug allergies check your prescription carefully.  For the next 24 hours, you can use MyChart to ask questions about today's visit, request a non-urgent call back, or ask for a work or school excuse from your e-visit provider. You will get an email in the next two days asking about your experience. I hope that your e-visit has been valuable and will speed your recovery.       

## 2020-06-27 NOTE — Progress Notes (Signed)
I have spent 5 minutes in review of e-visit questionnaire, review and updating patient chart, medical decision making and response to patient.   Pierre Cumpton Cody Kyana Aicher, PA-C    

## 2020-07-03 ENCOUNTER — Other Ambulatory Visit: Payer: Self-pay | Admitting: Family Medicine

## 2020-07-03 DIAGNOSIS — G43809 Other migraine, not intractable, without status migrainosus: Secondary | ICD-10-CM

## 2020-07-04 ENCOUNTER — Other Ambulatory Visit: Payer: Self-pay

## 2020-07-04 ENCOUNTER — Encounter: Payer: Self-pay | Admitting: Family Medicine

## 2020-07-04 ENCOUNTER — Ambulatory Visit (INDEPENDENT_AMBULATORY_CARE_PROVIDER_SITE_OTHER): Payer: 59 | Admitting: Family Medicine

## 2020-07-04 VITALS — BP 171/91 | HR 87 | Temp 99.3°F | Ht 70.0 in | Wt 353.4 lb

## 2020-07-04 DIAGNOSIS — Z79899 Other long term (current) drug therapy: Secondary | ICD-10-CM

## 2020-07-04 DIAGNOSIS — M170 Bilateral primary osteoarthritis of knee: Secondary | ICD-10-CM

## 2020-07-04 DIAGNOSIS — E1169 Type 2 diabetes mellitus with other specified complication: Secondary | ICD-10-CM

## 2020-07-04 DIAGNOSIS — I10 Essential (primary) hypertension: Secondary | ICD-10-CM

## 2020-07-04 DIAGNOSIS — G43809 Other migraine, not intractable, without status migrainosus: Secondary | ICD-10-CM

## 2020-07-04 DIAGNOSIS — E1165 Type 2 diabetes mellitus with hyperglycemia: Secondary | ICD-10-CM | POA: Diagnosis not present

## 2020-07-04 DIAGNOSIS — J302 Other seasonal allergic rhinitis: Secondary | ICD-10-CM

## 2020-07-04 DIAGNOSIS — Z6841 Body Mass Index (BMI) 40.0 and over, adult: Secondary | ICD-10-CM

## 2020-07-04 DIAGNOSIS — E785 Hyperlipidemia, unspecified: Secondary | ICD-10-CM

## 2020-07-04 DIAGNOSIS — E8881 Metabolic syndrome: Secondary | ICD-10-CM

## 2020-07-04 DIAGNOSIS — F5101 Primary insomnia: Secondary | ICD-10-CM

## 2020-07-04 LAB — BAYER DCA HB A1C WAIVED: HB A1C (BAYER DCA - WAIVED): 7.1 % — ABNORMAL HIGH (ref <7.0)

## 2020-07-04 MED ORDER — OXYCODONE HCL 10 MG PO TABS: 10.0000 mg | ORAL_TABLET | Freq: Two times a day (BID) | ORAL | 0 refills | Status: DC | PRN

## 2020-07-04 MED ORDER — SPIRONOLACTONE 100 MG PO TABS: 100.0000 mg | ORAL_TABLET | Freq: Every day | ORAL | 2 refills | Status: DC

## 2020-07-04 MED ORDER — OZEMPIC (2 MG/DOSE) 8 MG/3ML ~~LOC~~ SOPN: 2.0000 mg | PEN_INJECTOR | SUBCUTANEOUS | 1 refills | Status: DC

## 2020-07-04 MED ORDER — MONTELUKAST SODIUM 10 MG PO TABS: 10.0000 mg | ORAL_TABLET | Freq: Every day | ORAL | 1 refills | Status: DC

## 2020-07-04 MED ORDER — AMITRIPTYLINE HCL 50 MG PO TABS: 50.0000 mg | ORAL_TABLET | Freq: Every day | ORAL | 1 refills | Status: DC

## 2020-07-04 MED ORDER — ZALEPLON 5 MG PO CAPS: 5.0000 mg | ORAL_CAPSULE | Freq: Every evening | ORAL | 1 refills | Status: DC | PRN

## 2020-07-04 MED ORDER — LORATADINE 10 MG PO TABS
10.0000 mg | ORAL_TABLET | Freq: Every day | ORAL | 1 refills | Status: DC
Start: 2020-07-04 — End: 2020-12-31

## 2020-07-04 MED ORDER — FLUNISOLIDE 25 MCG/ACT (0.025%) NA SOLN: 2.0000 | Freq: Two times a day (BID) | NASAL | 5 refills | Status: DC

## 2020-07-04 NOTE — Progress Notes (Signed)
Assessment & Plan:  1. Type 2 diabetes mellitus with hyperglycemia, without long-term current use of insulin (HCC) Lab Results  Component Value Date   HGBA1C 6.9 04/06/2020   HGBA1C 7.9 (H) 12/24/2019   HGBA1C 6.9 07/06/2019   A1c 6.9 on 04/06/2020 which increased to 7.1 today. - Diabetes is not at goal of A1c < 7. - Medications: increase Ozempic from 1 mg to $Rem'2mg'dnlS$  once weekly.  - Home glucose monitoring: continue monitoring - Patient is currently taking a statin. Patient is taking an ACE-inhibitor/ARB.  - Instruction/counseling given: discussed diet  Diabetes Health Maintenance Due  Topic Date Due  . HEMOGLOBIN A1C  10/04/2020  . OPHTHALMOLOGY EXAM  02/07/2021  . FOOT EXAM  04/06/2021    Lab Results  Component Value Date   LABMICR See below: 01/27/2020   LABMICR 19.5 07/06/2019   - Lipid panel - CBC with Differential/Platelet - CMP14+EGFR - Bayer DCA Hb A1c Waived - Semaglutide, 2 MG/DOSE, (OZEMPIC, 2 MG/DOSE,) 8 MG/3ML SOPN; Inject 2 mg into the skin once a week.  Dispense: 9 mL; Refill: 1  2. Essential hypertension Uncontrolled. Spironolactone increased from 50 mg 100 mg once Vanessa.  - Lipid panel - CBC with Differential/Platelet - CMP14+EGFR - spironolactone (ALDACTONE) 100 MG tablet; Take 1 tablet (100 mg total) by mouth Vanessa.  Dispense: 30 tablet; Refill: 2  3. Hyperlipidemia associated with type 2 diabetes mellitus ([place]) Well controlled on current regimen.  - Lipid panel - CMP14+EGFR  4. Bilateral primary osteoarthritis of knee Well controlled on current regimen. Controlled substance agreement updated today. Urine drug screen collected today. PDMP reviewed with no concerning findings.  - CMP14+EGFR - Oxycodone HCl 10 MG TABS; Take 1 tablet (10 mg total) by mouth 2 (two) times Vanessa as needed.  Dispense: 60 tablet; Refill: 0 - Oxycodone HCl 10 MG TABS; Take 1 tablet (10 mg total) by mouth 2 (two) times Vanessa as needed.  Dispense: 60 tablet; Refill: 0 -  Oxycodone HCl 10 MG TABS; Take 1 tablet (10 mg total) by mouth 2 (two) times Vanessa as needed.  Dispense: 60 tablet; Refill: 0 - ToxASSURE Select 13 (MW), Urine  5. Primary insomnia Well controlled on current regimen.  - CMP14+EGFR - zaleplon (SONATA) 5 MG capsule; Take 1 capsule (5 mg total) by mouth at bedtime as needed for sleep. Take 5 mg by mouth at bedtime as needed for sleep.  Dispense: 90 capsule; Refill: 1 - ToxASSURE Select 13 (MW), Urine  6. Controlled substance agreement signed Controlled substance agreement updated today. Urine drug screen collected today. PDMP reviewed with no concerning findings.  - Oxycodone HCl 10 MG TABS; Take 1 tablet (10 mg total) by mouth 2 (two) times Vanessa as needed.  Dispense: 60 tablet; Refill: 0 - Oxycodone HCl 10 MG TABS; Take 1 tablet (10 mg total) by mouth 2 (two) times Vanessa as needed.  Dispense: 60 tablet; Refill: 0 - Oxycodone HCl 10 MG TABS; Take 1 tablet (10 mg total) by mouth 2 (two) times Vanessa as needed.  Dispense: 60 tablet; Refill: 0 - ToxASSURE Select 13 (MW), Urine  7. Seasonal allergies Uncontrolled. Continue Claritin and Singulair. Rx'd Nasalide.  - loratadine (CLARITIN) 10 MG tablet; Take 1 tablet (10 mg total) by mouth Vanessa.  Dispense: 90 tablet; Refill: 1 - montelukast (SINGULAIR) 10 MG tablet; Take 1 tablet (10 mg total) by mouth at bedtime.  Dispense: 90 tablet; Refill: 1 - flunisolide (NASALIDE) 25 MCG/ACT (0.025%) SOLN; Place 2 sprays into the nose 2 (  two) times Vanessa.  Dispense: 25 mL; Refill: 5  8. Morbid obesity with BMI of 50.0-59.9, adult ([place]) Ozempic increased from 1 mg to 2 mg once weekly.  - Semaglutide, 2 MG/DOSE, (OZEMPIC, 2 MG/DOSE,) 8 MG/3ML SOPN; Inject 2 mg into the skin once a week.  Dispense: 9 mL; Refill: 1  9. Metabolic syndrome Ozempic increased from 1 mg to 2 mg once weekly.  - Semaglutide, 2 MG/DOSE, (OZEMPIC, 2 MG/DOSE,) 8 MG/3ML SOPN; Inject 2 mg into the skin once a week.  Dispense: 9 mL; Refill:  1  10. Other migraine without status migrainosus, not intractable Well controlled on current regimen.  - amitriptyline (ELAVIL) 50 MG tablet; Take 1 tablet (50 mg total) by mouth Vanessa.  Dispense: 90 tablet; Refill: 1   Return in about 3 months (around 10/04/2020) for annual physical w. pap, DM, HTN.  Hendricks Limes, MSN, APRN, FNP-C [facility]  Subjective:    Patient ID: Vanessa [name], female    DOB: 18-Feb-1976, 45 y.o.   MRN: [id number]  Patient Care Team: Loman Brooklyn, FNP as PCP - General (Family Medicine)   Chief Complaint:  Chief Complaint  Patient presents with  . Diabetes  . Hypertension    3 month follow up of chronic medical conditions   . Ear Pain    Patient had a Evisit on 5/3 and states she is still having bilateral ear pain and ringing.     HPI: Vanessa [name] is a 45 y.o. female presenting on 07/04/2020 for Diabetes, Hypertension (3 month follow up of chronic medical conditions ), and Ear Pain (Patient had a Evisit on 5/3 and states she is still having bilateral ear pain and ringing. )  Pain assessment: Cause of pain- osteoarthritis Pain location- ankles, knees, and sometimes her back Pain on scale of 1-10- 7-8/10 without medication, 1-2/10 with medication Frequency- Vanessa What increases pain- walking too much and heavy lifting What makes pain better- medication, bring her legs up, and ice Effects on ADL- sometimes she is unable to bend the knee and is unable to shower at that time.  She has to wait till her pain medicine kicks in before she can take a shower. Any change in general medical condition- none  Current opioids rx- Oxycodone 10 mg BID PRN # meds rx- 60 Effectiveness of current meds- effective Adverse reactions from pain meds- none Morphine equivalent- 30 MME/day  Pill count performed-No Last drug screen - 07/06/2019 ( high risk q60m, moderate risk q56m, low risk yearly ) Urine drug screen today- Yes Was the [place]  reviewed- Yes  If yes were their any concerning findings? - No  Overdose risk: 260  Opioid Risk  04/06/2020  Alcohol 0  Illegal Drugs 0  Rx Drugs 0  Alcohol 0  Illegal Drugs 0  Rx Drugs 0  Age between 54-45 years  1  History of Preadolescent Sexual Abuse 0  Psychological Disease 0  Depression 0  Opioid Risk Tool Scoring 1  Opioid Risk Interpretation Low Risk   Pain contract signed on: 07/06/2019 - updated today (07/04/2020)  Diabetes: Patient presents for follow up of diabetes. Current symptoms include: none. Known diabetic complications: none. Medication compliance: yes. Current diet: in general, a "healthy" diet  ; patient does admit she has been eating more bread than previously. Current exercise: walking and weightlifting. Home blood sugar records: BGs are running  consistent with Hgb A1C. Is she  on ACE inhibitor or angiotensin II receptor blocker? Yes. Is she  on a statin? Yes.   Hypertension: patient does not check her BP at home. She is walking and lifting light (2-3) lbs weights.   New complaints: Patient reports bilateral ear pain that has been going on for about a week now. Prior to the onset she was outdoors with a lot of pollen. She has been taking her Claritin and Singulair. She does not use any nasal sprays. Reports she is unable to tolerate Flonase due to side effects of making her have cold symptoms.    Social history:  Relevant past medical, surgical, family and social history reviewed and updated as indicated. Interim medical history since our last visit reviewed.  Allergies and medications reviewed and updated.  DATA REVIEWED: CHART IN EPIC  ROS: Negative unless specifically indicated above in HPI.    Current Outpatient Medications:  .  amitriptyline (ELAVIL) 50 MG tablet, Take 1 tablet by mouth Vanessa., Disp: 90 tablet, Rfl: 0 .  atorvastatin (LIPITOR) 40 MG tablet, Take 1 tablet (40 mg total) by mouth Vanessa., Disp: 90 tablet, Rfl: 1 .  cyclobenzaprine  (FLEXERIL) 10 MG tablet, TAKE 1 TABLET BY MOUTH THREE TIMES A DAY AS NEEDED FOR MUSCLE SPASMS, Disp: 60 tablet, Rfl: 2 .  FLUoxetine (PROZAC) 20 MG capsule, Take 1 capsule (20 mg total) by mouth every evening., Disp: 30 capsule, Rfl: 11 .  Fremanezumab-vfrm (AJOVY) 225 MG/1.5ML SOAJ, Inject 225 mg into the skin every 30 (thirty) days., Disp: 4.5 mL, Rfl: 3 .  furosemide (LASIX) 40 MG tablet, Take 1 tablet (40 mg total) by mouth Vanessa., Disp: 90 tablet, Rfl: 3 .  glucose blood (ONE TOUCH ULTRA TEST) test strip, Test sugar Vanessa Dx E11.9, Disp: 100 each, Rfl: 3 .  halobetasol (ULTRAVATE) 0.05 % cream, Apply topically 2 (two) times Vanessa., Disp: 50 g, Rfl: 5 .  lisinopril (ZESTRIL) 40 MG tablet, Take 1 tablet (40 mg total) by mouth Vanessa., Disp: 90 tablet, Rfl: 1 .  loratadine (CLARITIN) 10 MG tablet, Take 1 tablet by mouth Vanessa., Disp: 90 tablet, Rfl: 0 .  montelukast (SINGULAIR) 10 MG tablet, Take 1 tablet (10 mg total) by mouth at bedtime., Disp: 90 tablet, Rfl: 1 .  naproxen (NAPROSYN) 500 MG tablet, Take 500 mg by mouth 2 (two) times Vanessa., Disp: , Rfl:  .  neomycin-polymyxin-hydrocortisone (CORTISPORIN) OTIC solution, Place 3 drops into both ears 4 (four) times Vanessa., Disp: 10 mL, Rfl: 0 .  OneTouch Delica Lancets 56E MISC, 1 each by Does not apply route Vanessa. Dx E11.9, Disp: 100 each, Rfl: 3 .  Oxycodone HCl 10 MG TABS, Take 1 tablet (10 mg total) by mouth 2 (two) times Vanessa as needed., Disp: 60 tablet, Rfl: 0 .  Oxycodone HCl 10 MG TABS, Take 1 tablet (10 mg total) by mouth 2 (two) times Vanessa as needed., Disp: 60 tablet, Rfl: 0 .  Oxycodone HCl 10 MG TABS, Take 1 tablet (10 mg total) by mouth 2 (two) times Vanessa as needed., Disp: 60 tablet, Rfl: 0 .  Rimegepant Sulfate (NURTEC) 75 MG TBDP, Take 75 mg by mouth Vanessa as needed. Onset migraine (max one Vanessa), Disp: 10 tablet, Rfl: 5 .  Semaglutide, 1 MG/DOSE, (OZEMPIC, 1 MG/DOSE,) 2 MG/1.5ML SOPN, Inject 1 mg into the skin once a week.,  Disp: 3 mL, Rfl: 12 .  spironolactone (ALDACTONE) 50 MG tablet, Take 1 tablet (50 mg total) by mouth Vanessa., Disp: 90 tablet, Rfl: 1 .  zaleplon (SONATA) 5 MG capsule, Take 1 capsule (5 mg total)  by mouth at bedtime as needed for sleep. Take 5 mg by mouth at bedtime as needed for sleep., Disp: 30 capsule, Rfl: 0   Allergies  Allergen Reactions  . Metrizamide Other (See Comments), Itching, Photosensitivity, Shortness Of Breath and Swelling    update Migraine instantly  . Mushroom Extract Complex Itching    Throat itching with cough  . Peanut Oil Anaphylaxis and Other (See Comments)    update  . Peanut-Containing Drug Products Itching    Itching throat with cough  . Diflucan In Dextrose [Fluconazole In Dextrose] Hives  . Iodinated Diagnostic Agents   . Iodine-131 Other (See Comments)    update   Past Medical History:  Diagnosis Date  . Hypertension   . Migraines   . Pseudotumor (inflammatory) of orbit   . Sepsis ([place]) 02/2019  . Vision loss     Past Surgical History:  Procedure Laterality Date  . CSF SHUNT      Social History   Socioeconomic History  . Marital status: Single    Spouse name: Not on file  . Number of children: Not on file  . Years of education: Not on file  . Highest education level: Not on file  Occupational History  . Not on file  Tobacco Use  . Smoking status: Never Smoker  . Smokeless tobacco: Never Used  Vaping Use  . Vaping Use: Never used  Substance and Sexual Activity  . Alcohol use: No  . Drug use: No  . Sexual activity: Not on file  Other Topics Concern  . Not on file  Social History Narrative   Lives with parents   Right handed   Caffeine: maybe 2 cups/day   Social Determinants of Health   Financial Resource Strain: Not on file  Food Insecurity: Not on file  Transportation Needs: Not on file  Physical Activity: Not on file  Stress: Not on file  Social Connections: Not on file  Intimate Partner Violence: Not on file         Objective:    BP (!) 171/91   Pulse 87   Temp 99.3 F (37.4 C) (Temporal)   Ht $R'5\' 10"'hn$  (1.778 m)   Wt (!) 353 lb 6.4 oz (160.3 kg)   SpO2 98%   BMI 50.71 kg/m   Wt Readings from Last 3 Encounters:  07/04/20 (!) 353 lb 6.4 oz (160.3 kg)  05/25/20 (!) 351 lb (159.2 kg)  04/06/20 (!) 353 lb 12.8 oz (160.5 kg)    Physical Exam Vitals reviewed.  Constitutional:      General: She is not in acute distress.    Appearance: Normal appearance. She is morbidly obese. She is not ill-appearing, toxic-appearing or diaphoretic.  HENT:     Head: Normocephalic and atraumatic.     Right Ear: Tympanic membrane, ear canal and external ear normal. There is no impacted cerumen.     Left Ear: Tympanic membrane, ear canal and external ear normal. There is no impacted cerumen.     Mouth/Throat:     Mouth: Mucous membranes are moist.     Pharynx: Oropharynx is clear.  Eyes:     General: No scleral icterus.       Right eye: No discharge.        Left eye: No discharge.     Conjunctiva/sclera: Conjunctivae normal.  Cardiovascular:     Rate and Rhythm: Normal rate and regular rhythm.     Heart sounds: Normal heart sounds. No murmur heard. No friction  rub. No gallop.   Pulmonary:     Effort: Pulmonary effort is normal. No respiratory distress.     Breath sounds: Normal breath sounds. No stridor. No wheezing, rhonchi or rales.  Musculoskeletal:        General: Normal range of motion.     Cervical back: Normal range of motion.  Lymphadenopathy:     Cervical: No cervical adenopathy.  Skin:    General: Skin is warm and dry.     Capillary Refill: Capillary refill takes less than 2 seconds.  Neurological:     General: No focal deficit present.     Mental Status: She is alert and oriented to person, place, and time. Mental status is at baseline.  Psychiatric:        Mood and Affect: Mood normal.        Behavior: Behavior normal.        Thought Content: Thought content normal.        Judgment:  Judgment normal.    No results found for: TSH Lab Results  Component Value Date   WBC 9.9 04/06/2020   HGB 12.2 04/06/2020   HCT 37.3 04/06/2020   MCV 81 04/06/2020   PLT 208 04/06/2020   Lab Results  Component Value Date   NA 138 04/06/2020   K 4.0 04/06/2020   CO2 22 04/06/2020   GLUCOSE 203 (H) 04/06/2020   BUN 10 04/06/2020   CREATININE 0.90 04/06/2020   BILITOT 0.4 04/06/2020   ALKPHOS 60 04/06/2020   AST 19 04/06/2020   ALT 22 04/06/2020   PROT 7.1 04/06/2020   ALBUMIN 3.9 04/06/2020   CALCIUM 9.0 04/06/2020   Lab Results  Component Value Date   CHOL 168 04/06/2020   Lab Results  Component Value Date   HDL 34 (L) 04/06/2020   Lab Results  Component Value Date   LDLCALC 95 04/06/2020   Lab Results  Component Value Date   TRIG 229 (H) 04/06/2020   Lab Results  Component Value Date   CHOLHDL 4.9 (H) 04/06/2020   Lab Results  Component Value Date   HGBA1C 6.9 04/06/2020

## 2020-07-05 LAB — CMP14+EGFR
ALT: 20 IU/L (ref 0–32)
AST: 14 IU/L (ref 0–40)
Albumin/Globulin Ratio: 1.3 (ref 1.2–2.2)
Albumin: 3.9 g/dL (ref 3.8–4.8)
Alkaline Phosphatase: 57 IU/L (ref 44–121)
BUN/Creatinine Ratio: 11 (ref 9–23)
BUN: 11 mg/dL (ref 6–24)
Bilirubin Total: 0.3 mg/dL (ref 0.0–1.2)
CO2: 24 mmol/L (ref 20–29)
Calcium: 9.3 mg/dL (ref 8.7–10.2)
Chloride: 101 mmol/L (ref 96–106)
Creatinine, Ser: 0.99 mg/dL (ref 0.57–1.00)
Globulin, Total: 3.1 g/dL (ref 1.5–4.5)
Glucose: 135 mg/dL — ABNORMAL HIGH (ref 65–99)
Potassium: 4 mmol/L (ref 3.5–5.2)
Sodium: 138 mmol/L (ref 134–144)
Total Protein: 7 g/dL (ref 6.0–8.5)
eGFR: 72 mL/min/{1.73_m2} (ref >59)

## 2020-07-05 LAB — LIPID PANEL
Chol/HDL Ratio: 5.3 ratio — ABNORMAL HIGH (ref 0.0–4.4)
Cholesterol, Total: 190 mg/dL (ref 100–199)
HDL: 36 mg/dL — ABNORMAL LOW (ref >39)
LDL Chol Calc (NIH): 127 mg/dL — ABNORMAL HIGH (ref 0–99)
Triglycerides: 152 mg/dL — ABNORMAL HIGH (ref 0–149)
VLDL Cholesterol Cal: 27 mg/dL (ref 5–40)

## 2020-07-05 LAB — CBC WITH DIFFERENTIAL/PLATELET
Basophils Absolute: 0 10*3/uL (ref 0.0–0.2)
Basos: 0 %
EOS (ABSOLUTE): 0.2 10*3/uL (ref 0.0–0.4)
Eos: 2 %
Hematocrit: 36.7 % (ref 34.0–46.6)
Hemoglobin: 11.9 g/dL (ref 11.1–15.9)
Immature Grans (Abs): 0 10*3/uL (ref 0.0–0.1)
Immature Granulocytes: 0 %
Lymphocytes Absolute: 2.6 10*3/uL (ref 0.7–3.1)
Lymphs: 33 %
MCH: 26.5 pg — ABNORMAL LOW (ref 26.6–33.0)
MCHC: 32.4 g/dL (ref 31.5–35.7)
MCV: 82 fL (ref 79–97)
Monocytes Absolute: 0.5 10*3/uL (ref 0.1–0.9)
Monocytes: 6 %
Neutrophils Absolute: 4.6 10*3/uL (ref 1.4–7.0)
Neutrophils: 59 %
Platelets: 188 10*3/uL (ref 150–450)
RBC: 4.49 x10E6/uL (ref 3.77–5.28)
RDW: 14.6 % (ref 11.7–15.4)
WBC: 7.9 10*3/uL (ref 3.4–10.8)

## 2020-07-06 ENCOUNTER — Other Ambulatory Visit: Payer: Self-pay | Admitting: Family Medicine

## 2020-07-06 DIAGNOSIS — E1169 Type 2 diabetes mellitus with other specified complication: Secondary | ICD-10-CM

## 2020-07-06 DIAGNOSIS — E785 Hyperlipidemia, unspecified: Secondary | ICD-10-CM

## 2020-07-06 DIAGNOSIS — E119 Type 2 diabetes mellitus without complications: Secondary | ICD-10-CM

## 2020-07-06 MED ORDER — ATORVASTATIN CALCIUM 80 MG PO TABS: 80.0000 mg | ORAL_TABLET | Freq: Every day | ORAL | 1 refills | Status: DC

## 2020-07-12 ENCOUNTER — Encounter: Payer: Self-pay | Admitting: Family Medicine

## 2020-07-12 LAB — TOXASSURE SELECT 13 (MW), URINE

## 2020-07-27 ENCOUNTER — Other Ambulatory Visit: Payer: Self-pay | Admitting: Family Medicine

## 2020-07-27 DIAGNOSIS — Z1231 Encounter for screening mammogram for malignant neoplasm of breast: Secondary | ICD-10-CM

## 2020-08-03 ENCOUNTER — Ambulatory Visit
Admission: RE | Admit: 2020-08-03 | Discharge: 2020-08-03 | Disposition: A | Discharge status: Home / Self Care | Payer: 59 | Source: Ambulatory Visit | Attending: Family Medicine | Admitting: Family Medicine

## 2020-08-03 ENCOUNTER — Other Ambulatory Visit: Payer: Self-pay

## 2020-08-03 DIAGNOSIS — Z1231 Encounter for screening mammogram for malignant neoplasm of breast: Secondary | ICD-10-CM

## 2020-08-17 ENCOUNTER — Telehealth: Payer: Self-pay

## 2020-08-17 NOTE — Telephone Encounter (Signed)
Patient states that [facility] is requiring a PA for her Ozempic 2mg . Pt believes she has one dose left.

## 2020-08-18 NOTE — Telephone Encounter (Signed)
(  Key: [id number])  Your information has been sent to [facility].  Ozempic 2mg    Dx: E11.65  Failed Metformin

## 2020-08-21 ENCOUNTER — Encounter: Payer: Self-pay | Admitting: Family Medicine

## 2020-08-21 NOTE — Telephone Encounter (Signed)
Per FAX:  Plan recommends: Bydureon, Trulicity, or Victoza

## 2020-08-21 NOTE — Telephone Encounter (Signed)
OZEMPIC INJ 8MG /3ML is denied for not meeting the prior authorization requirement(s). Details of this decision are in the notice attached below or have been faxed to you.

## 2020-08-22 NOTE — Telephone Encounter (Signed)
Number called for appeal They are faxing a form for appeal today   We will fill out and attach any needed records and fax back - should have a response within 72 hours   fax completed form to # [phone]

## 2020-08-22 NOTE — Telephone Encounter (Signed)
Could we please call and request appeal?  PA was filled out incorrectly--patient has tried and failed Bydureon  How Do I Request an Appeal? For an Expedited Appeal: You, your prescriber, or your representative can file an appeal by telephone, by fax, through the plan's website, or by mail. A verbal request by telephone is the fastest way to file an expedited (fast) request. Phone: Please call the number on the back of your member ID card. [phone]  If unable, I can call at a later time this week.

## 2020-08-23 NOTE — Addendum Note (Signed)
Encounter addended by: Earlene Plater on: 08/23/2020 7:35 AM  Actions taken: Imaging Exam ended

## 2020-08-23 NOTE — Addendum Note (Signed)
Encounter addended by: Earlene Plater on: 08/23/2020 7:35 AM  Actions taken: Imaging Exam begun

## 2020-08-25 ENCOUNTER — Other Ambulatory Visit: Payer: Self-pay | Admitting: Family Medicine

## 2020-08-25 DIAGNOSIS — R928 Other abnormal and inconclusive findings on diagnostic imaging of breast: Secondary | ICD-10-CM

## 2020-08-31 ENCOUNTER — Other Ambulatory Visit: Payer: Self-pay | Admitting: Family Medicine

## 2020-08-31 DIAGNOSIS — I10 Essential (primary) hypertension: Secondary | ICD-10-CM

## 2020-08-31 DIAGNOSIS — E119 Type 2 diabetes mellitus without complications: Secondary | ICD-10-CM

## 2020-09-19 ENCOUNTER — Telehealth: Payer: Self-pay | Admitting: Pharmacist

## 2020-09-19 NOTE — Telephone Encounter (Signed)
It looks like the PA was denied.  Would you like me to put in the order for Trulicity?

## 2020-09-19 NOTE — Telephone Encounter (Signed)
Ozempic '2mg'$  sample given Was reading previous notes (not all)  Were we able to get this covered? I didn't think so.  If not, can we switch to trulicity 4.'5mg'$ ? Patient doing well on ozempic '2mg'$ , but we can't keep providing samples when she has dual medicaid/care  Thanks!

## 2020-09-20 MED ORDER — TRULICITY 4.5 MG/0.5ML ~~LOC~~ SOAJ: 4.5000 mg | SUBCUTANEOUS | 5 refills | Status: DC

## 2020-09-20 NOTE — Telephone Encounter (Signed)
Order for trulicity sent to pharmacy to see if covered Patient given sample in the meantime and I discussed possible switch

## 2020-09-21 ENCOUNTER — Ambulatory Visit
Admission: RE | Admit: 2020-09-21 | Discharge: 2020-09-21 | Disposition: A | Discharge status: Home / Self Care | Payer: 59 | Source: Ambulatory Visit | Attending: Family Medicine | Admitting: Family Medicine

## 2020-09-21 ENCOUNTER — Other Ambulatory Visit: Payer: Self-pay

## 2020-09-21 DIAGNOSIS — R928 Other abnormal and inconclusive findings on diagnostic imaging of breast: Secondary | ICD-10-CM

## 2020-09-21 NOTE — Telephone Encounter (Signed)
Trulicity approved on insurance (will finish ozempic sample then transition) Patient counseled on transition Med list updated Denies personal and family history of Medullary thyroid cancer (MTC)

## 2020-09-30 ENCOUNTER — Other Ambulatory Visit: Payer: Self-pay | Admitting: Family Medicine

## 2020-09-30 DIAGNOSIS — I10 Essential (primary) hypertension: Secondary | ICD-10-CM

## 2020-10-04 ENCOUNTER — Ambulatory Visit (INDEPENDENT_AMBULATORY_CARE_PROVIDER_SITE_OTHER): Payer: 59 | Admitting: Orthopaedic Surgery

## 2020-10-04 ENCOUNTER — Ambulatory Visit (INDEPENDENT_AMBULATORY_CARE_PROVIDER_SITE_OTHER): Payer: 59 | Admitting: Family Medicine

## 2020-10-04 ENCOUNTER — Encounter: Payer: Self-pay | Admitting: Orthopaedic Surgery

## 2020-10-04 ENCOUNTER — Encounter: Payer: Self-pay | Admitting: Family Medicine

## 2020-10-04 ENCOUNTER — Ambulatory Visit: Payer: Self-pay

## 2020-10-04 ENCOUNTER — Other Ambulatory Visit: Payer: Self-pay

## 2020-10-04 ENCOUNTER — Other Ambulatory Visit (HOSPITAL_COMMUNITY)
Admission: RE | Admit: 2020-10-04 | Discharge: 2020-10-04 | Disposition: A | Discharge status: Home / Self Care | Payer: 59 | Source: Ambulatory Visit | Attending: Family Medicine | Admitting: Family Medicine

## 2020-10-04 VITALS — BP 150/76 | HR 98 | Temp 97.8°F | Ht 70.0 in | Wt 347.2 lb

## 2020-10-04 DIAGNOSIS — M79672 Pain in left foot: Secondary | ICD-10-CM

## 2020-10-04 DIAGNOSIS — M170 Bilateral primary osteoarthritis of knee: Secondary | ICD-10-CM

## 2020-10-04 DIAGNOSIS — E1169 Type 2 diabetes mellitus with other specified complication: Secondary | ICD-10-CM | POA: Diagnosis not present

## 2020-10-04 DIAGNOSIS — Z0000 Encounter for general adult medical examination without abnormal findings: Secondary | ICD-10-CM

## 2020-10-04 DIAGNOSIS — E785 Hyperlipidemia, unspecified: Secondary | ICD-10-CM

## 2020-10-04 DIAGNOSIS — F5101 Primary insomnia: Secondary | ICD-10-CM

## 2020-10-04 DIAGNOSIS — M25571 Pain in right ankle and joints of right foot: Secondary | ICD-10-CM | POA: Diagnosis not present

## 2020-10-04 DIAGNOSIS — Z1151 Encounter for screening for human papillomavirus (HPV): Secondary | ICD-10-CM

## 2020-10-04 DIAGNOSIS — G8929 Other chronic pain: Secondary | ICD-10-CM | POA: Diagnosis not present

## 2020-10-04 DIAGNOSIS — E1165 Type 2 diabetes mellitus with hyperglycemia: Secondary | ICD-10-CM

## 2020-10-04 DIAGNOSIS — R87629 Unspecified abnormal cytological findings in specimens from vagina: Secondary | ICD-10-CM

## 2020-10-04 DIAGNOSIS — M25572 Pain in left ankle and joints of left foot: Secondary | ICD-10-CM

## 2020-10-04 DIAGNOSIS — Z124 Encounter for screening for malignant neoplasm of cervix: Secondary | ICD-10-CM

## 2020-10-04 DIAGNOSIS — R87619 Unspecified abnormal cytological findings in specimens from cervix uteri: Secondary | ICD-10-CM | POA: Insufficient documentation

## 2020-10-04 DIAGNOSIS — I10 Essential (primary) hypertension: Secondary | ICD-10-CM | POA: Diagnosis not present

## 2020-10-04 DIAGNOSIS — Z0001 Encounter for general adult medical examination with abnormal findings: Secondary | ICD-10-CM | POA: Diagnosis not present

## 2020-10-04 DIAGNOSIS — Z79899 Other long term (current) drug therapy: Secondary | ICD-10-CM

## 2020-10-04 DIAGNOSIS — Z113 Encounter for screening for infections with a predominantly sexual mode of transmission: Secondary | ICD-10-CM | POA: Insufficient documentation

## 2020-10-04 DIAGNOSIS — Z01419 Encounter for gynecological examination (general) (routine) without abnormal findings: Secondary | ICD-10-CM | POA: Insufficient documentation

## 2020-10-04 DIAGNOSIS — M2142 Flat foot [pes planus] (acquired), left foot: Secondary | ICD-10-CM

## 2020-10-04 DIAGNOSIS — Z1159 Encounter for screening for other viral diseases: Secondary | ICD-10-CM

## 2020-10-04 LAB — BAYER DCA HB A1C WAIVED: HB A1C (BAYER DCA - WAIVED): 6.5 % (ref <7.0)

## 2020-10-04 MED ORDER — OXYCODONE HCL 10 MG PO TABS: 10.0000 mg | ORAL_TABLET | Freq: Two times a day (BID) | ORAL | 0 refills | Status: DC | PRN

## 2020-10-04 MED ORDER — SPIRONOLACTONE 100 MG PO TABS: 100.0000 mg | ORAL_TABLET | Freq: Two times a day (BID) | ORAL | 1 refills | Status: DC

## 2020-10-04 MED ORDER — ZALEPLON 10 MG PO CAPS: 10.0000 mg | ORAL_CAPSULE | Freq: Every evening | ORAL | 5 refills | Status: DC | PRN

## 2020-10-04 MED ORDER — BUPIVACAINE HCL 0.5 % IJ SOLN
2.0000 mL | INTRAMUSCULAR | Status: AC | PRN
  Administered 2020-10-04: 2 mL via INTRA_ARTICULAR

## 2020-10-04 MED ORDER — LIDOCAINE HCL 1 % IJ SOLN
2.0000 mL | INTRAMUSCULAR | Status: AC | PRN
  Administered 2020-10-04: 2 mL

## 2020-10-04 NOTE — Progress Notes (Signed)
Assessment & Plan:  Well adult exam - preventative health information provided - CBC with Differential/Platelet - CMP14+EGFR - lipid panel  2. Type 2 diabetes mellitus with hyperglycemia, without long-term current use of insulin (HCC) - well controlled on current regimen - insurance does not cover ozempic, will transition to trulicity when out of ozempic - Bayer DCA Hb A1c Waived- 6.5 today  3. Hyperlipidemia associated with type 2 diabetes mellitus ([place]) - on statin, improving on current regimen - Lipid panel  4. Essential hypertension - poorly controlled on current regimen - increasing spironolactone from QD to BID - encouraged to take BP at home and keep a log - printed education provided - CBC with Differential/Platelet - CMP14+EGFR - spironolactone (ALDACTONE) 100 MG tablet; Take 1 tablet (100 mg total) by mouth 2 (two) times daily.  Dispense: 180 tablet; Refill: 1  5. Encounter for hepatitis C screening test for low risk patient - Hepatitis C antibody  6. Bilateral primary osteoarthritis of knee - has appointment scheduled with orthopedics today - well controlled on current regimen - Oxycodone HCl 10 MG TABS; Take 1 tablet (10 mg total) by mouth 2 (two) times daily as needed.  Dispense: 60 tablet; Refill: 0 - Oxycodone HCl 10 MG TABS; Take 1 tablet (10 mg total) by mouth 2 (two) times daily as needed.  Dispense: 60 tablet; Refill: 0 - Oxycodone HCl 10 MG TABS; Take 1 tablet (10 mg total) by mouth 2 (two) times daily as needed.  Dispense: 60 tablet; Refill: 0  7. Controlled substance agreement signed - Oxycodone HCl 10 MG TABS; Take 1 tablet (10 mg total) by mouth 2 (two) times daily as needed.  Dispense: 60 tablet; Refill: 0 - Oxycodone HCl 10 MG TABS; Take 1 tablet (10 mg total) by mouth 2 (two) times daily as needed.  Dispense: 60 tablet; Refill: 0 - Oxycodone HCl 10 MG TABS; Take 1 tablet (10 mg total) by mouth 2 (two) times daily as needed.  Dispense: 60 tablet;  Refill: 0  8. Primary insomnia - increased Sonata from $RemoveBef'5mg'UvGHWrkjdP$  to $R'10mg'YD$  - zaleplon (SONATA) 10 MG capsule; Take 1 capsule (10 mg total) by mouth at bedtime as needed for sleep.  Dispense: 30 capsule; Refill: 5  9. Screening for cervical cancer - Cytology - PAP  10. Routine screening for STI (sexually transmitted infection) - Cytology - PAP  11. Screening for human papillomavirus (HPV) - Cytology - PAP   Follow-up: Return in about 3 months (around 01/04/2021) for follow-up of chronic medication conditions.   Lucile Crater, NP Student  Subjective:  Patient ID: Vanessa [name], female    DOB: 07-04-1975  Age: 45 y.o. MRN: [id number]  Patient Care Team: Loman Brooklyn, FNP as PCP - General (Family Medicine)   CC:  Chief Complaint  Patient presents with   Gynecologic Exam    HPI Vanessa [name] presents for annual physical with gynecological exam.  Occupation: employed, Marital status: single, Substance use: no Diet: regular, Exercise: none Last eye exam: due 01/2021 Last dental exam: due Last mammogram: 07/2020 Last pap smear: will get today Hepatitis C Screening: agreeable to get today Immunizations: Flu Vaccine:  will get when available Tdap Vaccine: up to date  COVID-19 Vaccine: up to date on initial series, eligible for booster Pneumonia Vaccine:  agreeable to get today  Depression/Anxiety: She states that her anxiety has been higher since her mammogram and breast sonogram. She states several members of her family have had breast and ovarian cancer. She feels  like her prozac is working. She states she is more stressed than usual but is expecting that to resolve once her results are back.   DEPRESSION SCREENING  Depression screen [facility] 2/9 10/04/2020 07/04/2020 04/06/2020  Decreased Interest 3 0 0  Down, Depressed, Hopeless 3 0 0  PHQ - 2 Score 6 0 0  Altered sleeping 3 3 -  Tired, decreased energy 3 1 -  Change in appetite 3 1 -  Feeling bad or failure about yourself  3 0 -   Trouble concentrating 2 1 -  Moving slowly or fidgety/restless 2 0 -  Suicidal thoughts 0 0 -  PHQ-9 Score 22 6 -  Difficult doing work/chores Somewhat difficult Not difficult at all -  Some recent data might be hidden    GAD 7 : Generalized Anxiety Score 10/04/2020 07/04/2020 07/12/2019 04/07/2019  Nervous, Anxious, on Edge 2 0 0 0  Control/stop worrying 1 0 0 0  Worry too much - different things 2 0 0 0  Trouble relaxing $RemoveBeforeDE'3 3 1 1  'ntaSAHRxvuzYfVI$ Restless $RemoveBeforeD'2 1 1 'NnrRMPKlZPESVP$ 0  Easily annoyed or irritable 3 2 0 1  Afraid - awful might happen 2 0 0 0  Total GAD 7 Score $Remov'15 6 2 2  'BOxGvo$ Anxiety Difficulty Somewhat difficult Somewhat difficult Not difficult at all Not difficult at all   Insomnia: She states she has difficulty falling and staying asleep. Currently taking Sonata, she states it is helping her fall asleep but she does not always stay asleep.  Edema: She states she is having more edema in her legs and feet than normal.   Hypertension: She is not taking her BP at home due to a broken cuff. She said that before it broke that her readings were in the 140s-150s.   Diabetes: She is taking her CBGs at home. Her readings are usually 90s-110s. She is taking the remaining samples of ozempic but will start trulicity when she is out. Her insurance does not cover ozempic. She states over the last few weeks she has been stress eating.   Osteoarthritis: She is taking oxycodone for pain. She states this is helping. She has an appointment later today with orthopedics to evaluate her knees and ankles.   Review of Systems  Constitutional:  Negative for chills, diaphoresis, fever, malaise/fatigue and weight loss.  HENT:  Negative for congestion, ear pain and hearing loss.        Nasal irritation from nasal spray  Eyes:  Negative for blurred vision, double vision, photophobia, pain, discharge and redness.  Respiratory:  Negative for cough, hemoptysis, sputum production, shortness of breath and wheezing.   Cardiovascular:   Positive for leg swelling. Negative for chest pain, palpitations and orthopnea.  Gastrointestinal:  Negative for abdominal pain, constipation, diarrhea and heartburn.  Genitourinary:  Negative for dysuria, frequency, hematuria and urgency.  Musculoskeletal:  Positive for joint pain. Negative for myalgias and neck pain.       Knees and ankles  Skin:  Negative for itching and rash.  Neurological:  Negative for dizziness, tingling, tremors, weakness and headaches.  Psychiatric/Behavioral:  Positive for depression. Negative for hallucinations, memory loss, substance abuse and suicidal ideas. The patient is nervous/anxious and has insomnia.     Current Outpatient Medications:    amitriptyline (ELAVIL) 50 MG tablet, Take 1 tablet (50 mg total) by mouth daily., Disp: 90 tablet, Rfl: 1   atorvastatin (LIPITOR) 80 MG tablet, Take 1 tablet (80 mg total) by mouth daily., Disp: 90 tablet, Rfl: 1  cyclobenzaprine (FLEXERIL) 10 MG tablet, TAKE 1 TABLET BY MOUTH THREE TIMES A DAY AS NEEDED FOR MUSCLE SPASMS, Disp: 60 tablet, Rfl: 2   Dulaglutide (TRULICITY) 4.5 GH/8.2XH SOPN, Inject 4.5 mg as directed once a week., Disp: 2 mL, Rfl: 5   flunisolide (NASALIDE) 25 MCG/ACT (0.025%) SOLN, Place 2 sprays into the nose 2 (two) times daily., Disp: 25 mL, Rfl: 5   FLUoxetine (PROZAC) 20 MG capsule, Take 1 capsule (20 mg total) by mouth every evening., Disp: 30 capsule, Rfl: 11   Fremanezumab-vfrm (AJOVY) 225 MG/1.5ML SOAJ, Inject 225 mg into the skin every 30 (thirty) days., Disp: 4.5 mL, Rfl: 3   furosemide (LASIX) 40 MG tablet, Take 1 tablet (40 mg total) by mouth daily., Disp: 90 tablet, Rfl: 3   glucose blood (ONE TOUCH ULTRA TEST) test strip, Test sugar daily Dx E11.9, Disp: 100 each, Rfl: 3   halobetasol (ULTRAVATE) 0.05 % cream, Apply topically 2 (two) times daily., Disp: 50 g, Rfl: 5   lisinopril (ZESTRIL) 40 MG tablet, Take 1 tablet by mouth daily., Disp: 90 tablet, Rfl: 0   loratadine (CLARITIN) 10 MG  tablet, Take 1 tablet (10 mg total) by mouth daily., Disp: 90 tablet, Rfl: 1   montelukast (SINGULAIR) 10 MG tablet, Take 1 tablet (10 mg total) by mouth at bedtime., Disp: 90 tablet, Rfl: 1   naproxen (NAPROSYN) 500 MG tablet, Take 500 mg by mouth 2 (two) times daily., Disp: , Rfl:    neomycin-polymyxin-hydrocortisone (CORTISPORIN) OTIC solution, Place 3 drops into both ears 4 (four) times daily., Disp: 10 mL, Rfl: 0   OneTouch Delica Lancets 37J MISC, 1 each by Does not apply route daily. Dx E11.9, Disp: 100 each, Rfl: 3   Rimegepant Sulfate (NURTEC) 75 MG TBDP, Take 75 mg by mouth daily as needed. Onset migraine (max one daily), Disp: 10 tablet, Rfl: 5   [START ON 11/03/2020] Oxycodone HCl 10 MG TABS, Take 1 tablet (10 mg total) by mouth 2 (two) times daily as needed., Disp: 60 tablet, Rfl: 0   Oxycodone HCl 10 MG TABS, Take 1 tablet (10 mg total) by mouth 2 (two) times daily as needed., Disp: 60 tablet, Rfl: 0   [START ON 12/03/2020] Oxycodone HCl 10 MG TABS, Take 1 tablet (10 mg total) by mouth 2 (two) times daily as needed., Disp: 60 tablet, Rfl: 0   spironolactone (ALDACTONE) 100 MG tablet, Take 1 tablet (100 mg total) by mouth 2 (two) times daily., Disp: 180 tablet, Rfl: 1   zaleplon (SONATA) 10 MG capsule, Take 1 capsule (10 mg total) by mouth at bedtime as needed for sleep., Disp: 30 capsule, Rfl: 5  Allergies  Allergen Reactions   Metrizamide Other (See Comments), Itching, Photosensitivity, Shortness Of Breath and Swelling    update Migraine instantly   Mushroom Extract Complex Itching    Throat itching with cough   Peanut Oil Anaphylaxis and Other (See Comments)    update   Peanut-Containing Drug Products Itching    Itching throat with cough   Diflucan In Dextrose [Fluconazole In Dextrose] Hives   Iodinated Diagnostic Agents    Iodine-131 Other (See Comments)    update    Past Medical History:  Diagnosis Date   Hypertension    Migraines    Pseudotumor (inflammatory) of orbit     Sepsis ([place]) 02/2019   Vision loss     Past Surgical History:  Procedure Laterality Date   CSF SHUNT      Family History  Problem Relation Age of Onset   Pulmonary Hypertension Mother    Congestive Heart Failure Mother    Hypertension Sister    Migraines Neg Hx     Social History   Socioeconomic History   Marital status: Single    Spouse name: Not on file   Number of children: Not on file   Years of education: Not on file   Highest education level: Not on file  Occupational History   Not on file  Tobacco Use   Smoking status: Never   Smokeless tobacco: Never  Vaping Use   Vaping Use: Never used  Substance and Sexual Activity   Alcohol use: No   Drug use: No   Sexual activity: Not on file  Other Topics Concern   Not on file  Social History Narrative   Lives with parents   Right handed   Caffeine: maybe 2 cups/day   Social Determinants of Health   Financial Resource Strain: Not on file  Food Insecurity: Not on file  Transportation Needs: Not on file  Physical Activity: Not on file  Stress: Not on file  Social Connections: Not on file  Intimate Partner Violence: Not on file      Objective:    BP (!) 150/76   Pulse 98   Temp 97.8 F (36.6 C) (Temporal)   Ht $R'5\' 10"'CG$  (1.778 m)   Wt (!) 157.5 kg   SpO2 92%   BMI 49.82 kg/m   Wt Readings from Last 3 Encounters:  10/04/20 (!) 157.5 kg  07/04/20 (!) 160.3 kg  05/25/20 (!) 159.2 kg    Physical Exam Vitals reviewed. Exam conducted with a chaperone present.  Constitutional:      General: She is not in acute distress.    Appearance: She is obese. She is not ill-appearing or diaphoretic.  HENT:     Head: Normocephalic and atraumatic.     Right Ear: Tympanic membrane, ear canal and external ear normal.     Left Ear: Tympanic membrane, ear canal and external ear normal.     Nose: Nasal tenderness present.     Comments: Bilateral erythema    Mouth/Throat:     Mouth: Mucous membranes are moist.      Pharynx: Oropharynx is clear.  Eyes:     Extraocular Movements: Extraocular movements intact.     Conjunctiva/sclera: Conjunctivae normal.     Pupils: Pupils are equal, round, and reactive to light.  Cardiovascular:     Rate and Rhythm: Normal rate and regular rhythm.     Pulses: Decreased pulses.          Radial pulses are 2+ on the right side and 2+ on the left side.       Dorsalis pedis pulses are 1+ on the right side and 1+ on the left side.     Heart sounds: Normal heart sounds.  Pulmonary:     Effort: Pulmonary effort is normal.     Breath sounds: Normal breath sounds.  Abdominal:     General: Bowel sounds are normal.     Palpations: Abdomen is soft.  Genitourinary:    General: Normal vulva.     Exam position: Lithotomy position.     Labia:        Right: No rash, tenderness, lesion or injury.        Left: No rash, tenderness, lesion or injury.      Vagina: Normal.     Cervix: Cervical bleeding present.  Uterus: Normal.      Adnexa: Right adnexa normal and left adnexa normal.     Comments: Small polyp on cervix Musculoskeletal:     Right lower leg: 1+ Pitting Edema present.     Left lower leg: 1+ Pitting Edema present.  Skin:    General: Skin is warm and dry.     Capillary Refill: Capillary refill takes less than 2 seconds.  Neurological:     General: No focal deficit present.     Mental Status: She is alert and oriented to person, place, and time. Mental status is at baseline.  Psychiatric:        Behavior: Behavior normal.        Thought Content: Thought content normal.        Judgment: Judgment normal.     Comments: anxious    No results found for: TSH Lab Results  Component Value Date   WBC 7.9 07/04/2020   HGB 11.9 07/04/2020   HCT 36.7 07/04/2020   MCV 82 07/04/2020   PLT 188 07/04/2020   Lab Results  Component Value Date   NA 138 07/04/2020   K 4.0 07/04/2020   CO2 24 07/04/2020   GLUCOSE 135 (H) 07/04/2020   BUN 11 07/04/2020   CREATININE  0.99 07/04/2020   BILITOT 0.3 07/04/2020   ALKPHOS 57 07/04/2020   AST 14 07/04/2020   ALT 20 07/04/2020   PROT 7.0 07/04/2020   ALBUMIN 3.9 07/04/2020   CALCIUM 9.3 07/04/2020   EGFR 72 07/04/2020   Lab Results  Component Value Date   CHOL 190 07/04/2020   Lab Results  Component Value Date   HDL 36 (L) 07/04/2020   Lab Results  Component Value Date   LDLCALC 127 (H) 07/04/2020   Lab Results  Component Value Date   TRIG 152 (H) 07/04/2020   Lab Results  Component Value Date   CHOLHDL 5.3 (H) 07/04/2020   Lab Results  Component Value Date   HGBA1C 6.5 10/04/2020

## 2020-10-04 NOTE — Progress Notes (Signed)
Office Visit Note   Patient: Vanessa [name]           Date of Birth: 09-Mar-1975           MRN: [id number] Visit Date: 10/04/2020              Requested by: Loman Brooklyn, [place],  [place] [id number] PCP: Loman Brooklyn, FNP   Assessment & Plan: Visit Diagnoses:  1. Pain in left foot   2. Chronic pain of left knee   3. Bilateral primary osteoarthritis of knee   4. Bilateral pes planus   5. Morbid obesity with BMI of 50.0-59.9, adult [facility])     Plan: Vanessa [name] has been seen in the past for evaluation of bilateral knee osteoarthritis.  She also has a history of morbid obesity and is in the midst of weight loss.  She is lost about 26 pounds over several months.  She is having recurrent osteoarthritic symptoms of her left knee and wanted to have a cortisone injection.  We had a long discussion regarding her diagnosis of what she can expect over time.  Will inject the knee with betamethasone today.  Also experiencing considerable left foot pain with x-rays demonstrating considerable midfoot arthritis.  She also has large plantar and posterior heel spurs.  I wonder if she is not developing an early Charcot foot from her diabetes.  She is wearing good comfortable shoes and has arch supports at home as she also has pes planus.  We will continue with a comfortable shoes but can offer her custom-made shoes at some point in the future.  Hopefully this will improve as she loses weight  Follow-Up Instructions: Return if symptoms worsen or fail to improve.   Orders:  Orders Placed This Encounter  Procedures   XR KNEE 3 VIEW LEFT   XR Foot Complete Left   No orders of the defined types were placed in this encounter.     Procedures: Large Joint Inj: L knee on 10/04/2020 12:03 PM Indications: pain and diagnostic evaluation Details: 25 G 1.5 in needle, anterolateral approach  Arthrogram: No  Medications: 2 mL lidocaine 1 %; 2 mL bupivacaine 0.5 %  12 mg  betamethasone injected into the lateral compartment left knee with Marcaine and Xylocaine Procedure, treatment alternatives, risks and benefits explained, specific risks discussed. Consent was given by the patient. Patient was prepped and draped in the usual sterile fashion.      Clinical Data: No additional findings.   Subjective: Chief Complaint  Patient presents with   Left Knee - Pain   Left Foot - Pain  Patient presents today for left knee and left foot pain. She states that her knee has been hurting for two months. No known injury. Her pain is all throughout her knee. She said that it is worsening and feels weak at times. Her foot pain has been present for a week. No injury. She said that she has shooting pains at the top of her foot. The pain eases some with walking. She takes Oxycodone for pain.  Has history of diabetes head and is trying to avoid "insulin".  Has lost 26 pounds over the last several months.  Has had prior diagnosis of bilateral knee osteoarthritis.  Wearing comfortable shoes with inserts.  Does have diabetic neuropathy with burning and tingling in both of her feet  HPI  Review of Systems   Objective: Vital Signs: There were no vitals taken for this visit.  Physical Exam Constitutional:      Appearance: She is well-developed.  Eyes:     Pupils: Pupils are equal, round, and reactive to light.  Pulmonary:     Effort: Pulmonary effort is normal.  Skin:    General: Skin is warm and dry.  Neurological:     Mental Status: She is alert and oriented to person, place, and time.  Psychiatric:        Behavior: Behavior normal.    Ortho Exam awake alert and oriented x3.  Comfortable sitting.  Large knees.  Full extension of flexed about 95 degrees.  No instability.  Left knee was causing more trouble today.  There was both medial lateral joint pain as well as some pain with patella compression and patella crepitation.  Left foot with pes planus.  No pain along  the posterior plantar aspect of the heel.  No obvious deformity.  Seems to have good protective sensation and good capillary refill to the toes.  Has diffuse tenderness along the dorsum of the midfoot medial to lateral with more discomfort near the tarsometatarsal junction laterally.  Skin otherwise intact  Specialty Comments:  No specialty comments available.  Imaging: XR Foot Complete Left  Result Date: 10/04/2020 Films of the left foot were obtained in 3 projections.  There are considerable midfoot degenerative changes with osteophyte formation and ectopic bone.  There is pes planus and some loss of the arch.  There also appears to be some mild lateral position of the fifth metatarsal in relationship to the cuboid but no history of injury or trauma.  Patient does have diabetes with some neuropathy.  In addition there is a large plantar heel spur and a smaller posterior heel spur with the patient is not symptomatic  XR KNEE 3 VIEW LEFT  Result Date: 10/04/2020 Use of the left knee were obtained in 3 projections standing.  Alignment appears to be about neutral.  There are degenerative changes in all 3 compartments with peripheral osteophytes.  There is some more narrowing of the medial than the lateral compartment.  No ectopic calcification.  No acute changes.  Considerable patellofemoral arthritis with calcification and ectopic bone extending along the lateral patella facet.  Films are consistent with moderate to advanced osteoarthritis    PMFS History: Patient Active Problem List   Diagnosis Date Noted   Controlled substance agreement signed 04/06/2020   Chronic migraine without aura without status migrainosus, not intractable 10/05/2019   Bilateral primary osteoarthritis of knee 08/11/2019   Bilateral pes planus 08/11/2019   Hyperlipidemia associated with type 2 diabetes mellitus ([place]) 07/05/2019   Diabetes mellitus without complication ([place]) [id number]   Other complicated headache  syndrome [id number]   Metabolic syndrome [id number]   Essential hypertension 03/04/2016   PCOS (polycystic ovarian syndrome) 03/04/2016   Morbid obesity with BMI of 50.0-59.9, adult ([place]) 11/21/2015   S/P ventriculoperitoneal shunt 10/25/2015   IIH (idiopathic intracranial hypertension) 09/21/2015   Blindness of right eye 08/14/2015   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   Past Medical History:  Diagnosis Date   Hypertension    Migraines    Pseudotumor (inflammatory) of orbit    Sepsis ([place]) 02/2019   Vision loss     Family History  Problem Relation Age of Onset   Pulmonary Hypertension Mother    Congestive Heart Failure Mother    Hypertension Sister    Migraines Neg Hx     Past Surgical History:  Procedure Laterality Date   CSF SHUNT  Social History   Occupational History   Not on file  Tobacco Use   Smoking status: Never   Smokeless tobacco: Never  Vaping Use   Vaping Use: Never used  Substance and Sexual Activity   Alcohol use: No   Drug use: No   Sexual activity: Not on file

## 2020-10-05 LAB — CBC WITH DIFFERENTIAL/PLATELET
Basophils Absolute: 0 10*3/uL (ref 0.0–0.2)
Basos: 0 %
EOS (ABSOLUTE): 0.1 10*3/uL (ref 0.0–0.4)
Eos: 2 %
Hematocrit: 36.7 % (ref 34.0–46.6)
Hemoglobin: 11.6 g/dL (ref 11.1–15.9)
Immature Grans (Abs): 0 10*3/uL (ref 0.0–0.1)
Immature Granulocytes: 0 %
Lymphocytes Absolute: 2.3 10*3/uL (ref 0.7–3.1)
Lymphs: 30 %
MCH: 25.1 pg — ABNORMAL LOW (ref 26.6–33.0)
MCHC: 31.6 g/dL (ref 31.5–35.7)
MCV: 79 fL (ref 79–97)
Monocytes Absolute: 0.5 10*3/uL (ref 0.1–0.9)
Monocytes: 6 %
Neutrophils Absolute: 4.7 10*3/uL (ref 1.4–7.0)
Neutrophils: 62 %
Platelets: 197 10*3/uL (ref 150–450)
RBC: 4.62 x10E6/uL (ref 3.77–5.28)
RDW: 14.7 % (ref 11.7–15.4)
WBC: 7.6 10*3/uL (ref 3.4–10.8)

## 2020-10-05 LAB — CMP14+EGFR
ALT: 20 IU/L (ref 0–32)
AST: 22 IU/L (ref 0–40)
Albumin/Globulin Ratio: 1.1 — ABNORMAL LOW (ref 1.2–2.2)
Albumin: 3.9 g/dL (ref 3.8–4.8)
Alkaline Phosphatase: 56 IU/L (ref 44–121)
BUN/Creatinine Ratio: 12 (ref 9–23)
BUN: 10 mg/dL (ref 6–24)
Bilirubin Total: 0.3 mg/dL (ref 0.0–1.2)
CO2: 21 mmol/L (ref 20–29)
Calcium: 8.9 mg/dL (ref 8.7–10.2)
Chloride: 102 mmol/L (ref 96–106)
Creatinine, Ser: 0.85 mg/dL (ref 0.57–1.00)
Globulin, Total: 3.4 g/dL (ref 1.5–4.5)
Glucose: 115 mg/dL — ABNORMAL HIGH (ref 65–99)
Potassium: 4.2 mmol/L (ref 3.5–5.2)
Sodium: 137 mmol/L (ref 134–144)
Total Protein: 7.3 g/dL (ref 6.0–8.5)
eGFR: 87 mL/min/{1.73_m2} (ref >59)

## 2020-10-05 LAB — LIPID PANEL
Chol/HDL Ratio: 4.3 ratio (ref 0.0–4.4)
Cholesterol, Total: 143 mg/dL (ref 100–199)
HDL: 33 mg/dL — ABNORMAL LOW (ref >39)
LDL Chol Calc (NIH): 87 mg/dL (ref 0–99)
Triglycerides: 130 mg/dL (ref 0–149)
VLDL Cholesterol Cal: 23 mg/dL (ref 5–40)

## 2020-10-05 LAB — HEPATITIS C ANTIBODY: Hep C Virus Ab: <0.1 s/co ratio (ref 0.0–0.9)

## 2020-10-11 LAB — CYTOLOGY - PAP
Chlamydia: NEGATIVE
Comment: NEGATIVE
Comment: NEGATIVE
Comment: NEGATIVE
Comment: NORMAL
High risk HPV: NEGATIVE
Neisseria Gonorrhea: NEGATIVE
Trichomonas: NEGATIVE

## 2020-10-11 NOTE — Addendum Note (Signed)
Addended by: Loman Brooklyn on: 10/11/2020 05:14 PM   Modules accepted: Orders

## 2020-10-14 ENCOUNTER — Encounter: Payer: Self-pay | Admitting: Physician Assistant

## 2020-10-14 ENCOUNTER — Telehealth: Payer: 59 | Admitting: Physician Assistant

## 2020-10-14 DIAGNOSIS — U071 COVID-19: Secondary | ICD-10-CM

## 2020-10-14 MED ORDER — MOLNUPIRAVIR EUA 200MG CAPSULE: 4.0000 | ORAL_CAPSULE | Freq: Two times a day (BID) | ORAL | 0 refills | Status: AC

## 2020-10-14 NOTE — Progress Notes (Signed)
Virtual Visit via Video Note  I connected with Vanessa [name] on 10/14/20 at  8:30 AM EDT by a video enabled telemedicine application and verified that I am speaking with the correct person using two identifiers.  Location: Patient: Patient's home Provider: Provider's office  Person participating in the virtual visit: Patient and provider    I discussed the limitations of evaluation and management by telemedicine and the availability of in person appointments. The patient expressed understanding and agreed to proceed.  I discussed the assessment and treatment plan with the patient. The patient was provided an opportunity to ask questions and all were answered. The patient agreed with the plan and demonstrated an understanding of the instructions.   The patient was advised to call back or seek an in-person evaluation if the symptoms worsen or if the condition fails to improve as anticipated.  I provided 15 minutes of non-face-to-face time during this encounter.   Sahar M Osman, PA-C  Subjective:    Patient ID: Vanessa [name], female    DOB: 02/26/1975, 45 y.o.   MRN: [id number]  No chief complaint on file.   45 yo F in NAD connects via video requesting covid 19 antiviral meds. Developed nasal congestion and headache since today, tested positive for covid 19 today. Has taken Benadryl yesterday. State was exposed to somone with Covid 19 afew days ago. Pt with PMH of HLD on Atorvastatin, has PreDM and HTN on meds.   Other Associated symptoms include congestion and headaches. Pertinent negatives include no abdominal pain, chest pain, chills, coughing, diaphoresis, fatigue, fever, myalgias, nausea, rash, sore throat, vomiting or weakness.  Patient is in today for Covid 19 antiviral meds.   Past Medical History:  Diagnosis Date   Hypertension    Migraines    Pseudotumor (inflammatory) of orbit    Sepsis (HCC) 02/2019   Vision loss     Past Surgical History:  Procedure Laterality  Date   CSF SHUNT      Family History  Problem Relation Age of Onset   Pulmonary Hypertension Mother    Congestive Heart Failure Mother    Hypertension Sister    Migraines Neg Hx     Social History   Socioeconomic History   Marital status: Single    Spouse name: Not on file   Number of children: Not on file   Years of education: Not on file   Highest education level: Not on file  Occupational History   Not on file  Tobacco Use   Smoking status: Never   Smokeless tobacco: Never  Vaping Use   Vaping Use: Never used  Substance and Sexual Activity   Alcohol use: No   Drug use: No   Sexual activity: Not on file  Other Topics Concern   Not on file  Social History Narrative   Lives with parents   Right handed   Caffeine: maybe 2 cups/day   Social Determinants of Health   Financial Resource Strain: Not on file  Food Insecurity: Not on file  Transportation Needs: Not on file  Physical Activity: Not on file  Stress: Not on file  Social Connections: Not on file  Intimate Partner Violence: Not on file    Outpatient Medications Prior to Visit  Medication Sig Dispense Refill   amitriptyline (ELAVIL) 50 MG tablet Take 1 tablet (50 mg total) by mouth daily. 90 tablet 1   atorvastatin (LIPITOR) 80 MG tablet Take 1 tablet (80 mg total) by mouth daily. 90 tablet 1     cyclobenzaprine (FLEXERIL) 10 MG tablet TAKE 1 TABLET BY MOUTH THREE TIMES A DAY AS NEEDED FOR MUSCLE SPASMS 60 tablet 2   Dulaglutide (TRULICITY) 4.5 MG/0.5ML SOPN Inject 4.5 mg as directed once a week. 2 mL 5   flunisolide (NASALIDE) 25 MCG/ACT (0.025%) SOLN Place 2 sprays into the nose 2 (two) times daily. 25 mL 5   FLUoxetine (PROZAC) 20 MG capsule Take 1 capsule (20 mg total) by mouth every evening. 30 capsule 11   Fremanezumab-vfrm (AJOVY) 225 MG/1.5ML SOAJ Inject 225 mg into the skin every 30 (thirty) days. 4.5 mL 3   furosemide (LASIX) 40 MG tablet Take 1 tablet (40 mg total) by mouth daily. 90 tablet 3    glucose blood (ONE TOUCH ULTRA TEST) test strip Test sugar daily Dx E11.9 100 each 3   halobetasol (ULTRAVATE) 0.05 % cream Apply topically 2 (two) times daily. 50 g 5   lisinopril (ZESTRIL) 40 MG tablet Take 1 tablet by mouth daily. 90 tablet 0   loratadine (CLARITIN) 10 MG tablet Take 1 tablet (10 mg total) by mouth daily. 90 tablet 1   montelukast (SINGULAIR) 10 MG tablet Take 1 tablet (10 mg total) by mouth at bedtime. 90 tablet 1   naproxen (NAPROSYN) 500 MG tablet Take 500 mg by mouth 2 (two) times daily.     neomycin-polymyxin-hydrocortisone (CORTISPORIN) OTIC solution Place 3 drops into both ears 4 (four) times daily. 10 mL 0   OneTouch Delica Lancets 30G MISC 1 each by Does not apply route daily. Dx E11.9 100 each 3   [START ON 11/03/2020] Oxycodone HCl 10 MG TABS Take 1 tablet (10 mg total) by mouth 2 (two) times daily as needed. 60 tablet 0   Oxycodone HCl 10 MG TABS Take 1 tablet (10 mg total) by mouth 2 (two) times daily as needed. 60 tablet 0   [START ON 12/03/2020] Oxycodone HCl 10 MG TABS Take 1 tablet (10 mg total) by mouth 2 (two) times daily as needed. 60 tablet 0   Rimegepant Sulfate (NURTEC) 75 MG TBDP Take 75 mg by mouth daily as needed. Onset migraine (max one daily) 10 tablet 5   spironolactone (ALDACTONE) 100 MG tablet Take 1 tablet (100 mg total) by mouth 2 (two) times daily. 180 tablet 1   zaleplon (SONATA) 10 MG capsule Take 1 capsule (10 mg total) by mouth at bedtime as needed for sleep. 30 capsule 5   No facility-administered medications prior to visit.    Allergies  Allergen Reactions   Metrizamide Other (See Comments), Itching, Photosensitivity, Shortness Of Breath and Swelling    update Migraine instantly   Mushroom Extract Complex Itching    Throat itching with cough   Peanut Oil Anaphylaxis and Other (See Comments)    update   Peanut-Containing Drug Products Itching    Itching throat with cough   Diflucan In Dextrose [Fluconazole In Dextrose] Hives    Iodinated Diagnostic Agents    Iodine-131 Other (See Comments)    update    Review of Systems  Constitutional:  Negative for activity change, appetite change, chills, diaphoresis, fatigue and fever.  HENT:  Positive for congestion. Negative for ear discharge, ear pain, postnasal drip, rhinorrhea, sinus pressure, sinus pain, sneezing, sore throat, tinnitus, trouble swallowing and voice change.   Respiratory:  Negative for apnea, cough, choking, chest tightness, shortness of breath, wheezing and stridor.   Cardiovascular:  Negative for chest pain, palpitations and leg swelling.  Gastrointestinal:  Negative for abdominal pain, diarrhea, nausea   and vomiting.  Musculoskeletal:  Negative for myalgias.  Skin:  Negative for rash.  Neurological:  Positive for headaches. Negative for dizziness, weakness and light-headedness.  Hematological:  Negative for adenopathy.  Psychiatric/Behavioral:  Negative for agitation, behavioral problems and confusion.       Objective:    Physical Exam Constitutional:      General: She is not in acute distress.    Appearance: Normal appearance. She is obese. She is not ill-appearing, toxic-appearing or diaphoretic.  Pulmonary:     Effort: No respiratory distress.  Neurological:     Mental Status: She is alert and oriented to person, place, and time.  Psychiatric:        Mood and Affect: Mood normal.        Behavior: Behavior normal.        Thought Content: Thought content normal.        Judgment: Judgment normal.    There were no vitals taken for this visit. Wt Readings from Last 3 Encounters:  10/04/20 (!) 347 lb 3.2 oz (157.5 kg)  07/04/20 (!) 353 lb 6.4 oz (160.3 kg)  05/25/20 (!) 351 lb (159.2 kg)    There are no preventive care reminders to display for this patient.  There are no preventive care reminders to display for this patient.   No results found for: TSH Lab Results  Component Value Date   WBC 7.6 10/04/2020   HGB 11.6 10/04/2020    HCT 36.7 10/04/2020   MCV 79 10/04/2020   PLT 197 10/04/2020   Lab Results  Component Value Date   NA 137 10/04/2020   K 4.2 10/04/2020   CO2 21 10/04/2020   GLUCOSE 115 (H) 10/04/2020   BUN 10 10/04/2020   CREATININE 0.85 10/04/2020   BILITOT 0.3 10/04/2020   ALKPHOS 56 10/04/2020   AST 22 10/04/2020   ALT 20 10/04/2020   PROT 7.3 10/04/2020   ALBUMIN 3.9 10/04/2020   CALCIUM 8.9 10/04/2020   EGFR 87 10/04/2020   Lab Results  Component Value Date   CHOL 143 10/04/2020   Lab Results  Component Value Date   HDL 33 (L) 10/04/2020   Lab Results  Component Value Date   LDLCALC 87 10/04/2020   Lab Results  Component Value Date   TRIG 130 10/04/2020   Lab Results  Component Value Date   CHOLHDL 4.3 10/04/2020   Lab Results  Component Value Date   HGBA1C 6.5 10/04/2020       Assessment & Plan:   Problem List Items Addressed This Visit   None   Meds ordered this encounter  Medications   molnupiravir EUA 200 mg CAPS    Sig: Take 4 capsules (800 mg total) by mouth 2 (two) times daily for 5 days.    Dispense:  40 capsule    Refill:  0    Order Specific Question:   Supervising Provider    Answer:   MILLER, BRIAN [[id number]]   PT LIVES IN [place] AND REQUESTS MEDICINE TO BE SENT TO PHARMACY THERE    You are being prescribed MOLNUPIRAVIR for COVID-19 infection.      Please call the pharmacy or go through the drive through vs going inside if you are picking up the mediation yourself to prevent further spread. If prescribed to a [facility] affiliated pharmacy, a pharmacist will bring the medication out to your car.   ADMINISTRATION INSTRUCTIONS: Take with or without food. Swallow the tablets whole. Don't chew, crush, or break   the medications because it might not work as well  For each dose of the medication, you should be taking FOUR tablets at one time, TWICE a day   Finish your full five-day course of Molnupiravir even if you feel better before you're done.  Stopping this medication too early can make it less effective to prevent severe illness related to COVID19.    Molnupiravir is prescribed for YOU ONLY. Don't share it with others, even if they have similar symptoms as you. This medication might not be right for everyone.   Make sure to take steps to protect yourself and others while you're taking this medication in order to get well soon and to prevent others from getting sick with COVID-19.   **If you are of childbearing potential (any gender) - it is advised to not get pregnant while taking this medication and recommended that condoms are used for female partners the next 3 months after taking the medication out of extreme caution    COMMON SIDE EFFECTS: Diarrhea Nausea  Dizziness    If your COVID-19 symptoms get worse, get medical help right away. Call 911 if you experience symptoms such as worsening cough, trouble breathing, chest pain that doesn't go away, confusion, a hard time staying awake, and pale or blue-colored skin. This medication won't prevent all COVID-19 cases from getting worse.     Sahar M Osman, PA-C  

## 2020-10-26 ENCOUNTER — Other Ambulatory Visit: Payer: Self-pay | Admitting: Family Medicine

## 2020-10-26 DIAGNOSIS — J302 Other seasonal allergic rhinitis: Secondary | ICD-10-CM

## 2020-11-14 ENCOUNTER — Ambulatory Visit (INDEPENDENT_AMBULATORY_CARE_PROVIDER_SITE_OTHER): Payer: 59 | Admitting: Obstetrics & Gynecology

## 2020-11-14 ENCOUNTER — Other Ambulatory Visit: Payer: Self-pay

## 2020-11-14 ENCOUNTER — Other Ambulatory Visit (HOSPITAL_COMMUNITY)
Admission: RE | Admit: 2020-11-14 | Discharge: 2020-11-14 | Disposition: A | Discharge status: Home / Self Care | Payer: 59 | Source: Ambulatory Visit | Attending: Obstetrics & Gynecology | Admitting: Obstetrics & Gynecology

## 2020-11-14 ENCOUNTER — Encounter: Payer: Self-pay | Admitting: Obstetrics & Gynecology

## 2020-11-14 VITALS — BP 152/93 | HR 93 | Ht 70.0 in | Wt 347.0 lb

## 2020-11-14 DIAGNOSIS — R87619 Unspecified abnormal cytological findings in specimens from cervix uteri: Secondary | ICD-10-CM

## 2020-11-14 DIAGNOSIS — Z3202 Encounter for pregnancy test, result negative: Secondary | ICD-10-CM

## 2020-11-14 LAB — POCT URINE PREGNANCY: Preg Test, Ur: NEGATIVE

## 2020-11-14 NOTE — Progress Notes (Signed)
Endometrial Biopsy Procedure Note  Pre-operative Diagnosis: Atypical glandular cells noted on cervical cytology, suggests endometrial origin, HPV was negative  Post-operative Diagnosis: same  Indications: Atypical glandular cells  Procedure Details   Urine pregnancy test was not done.  The risks (including infection, bleeding, pain, and uterine perforation) and benefits of the procedure were explained to the patient and Written informed consent was obtained.  Antibiotic prophylaxis against endocarditis was not indicated.   The patient was placed in the dorsal lithotomy position.  Bimanual exam showed the uterus to be in the neutral position.  A Graves' speculum inserted in the vagina, and the cervix prepped with povidone iodine.  Endocervical curettage with a Kevorkian curette was not performed.   A sharp tenaculum was applied to the anterior lip of the cervix for stabilization.  A sterile uterine sound was used to sound the uterus to a depth of 6.5 cm.  A Pipelle endometrial aspirator was used to sample the endometrium.  Sample was sent for pathologic examination.  Condition: Stable  Complications: None  Plan:  The patient was advised to call for any fever or for prolonged or severe pain or bleeding. She was advised to use OTC analgesics as needed for mild to moderate pain. She was advised to avoid vaginal intercourse for 48 hours or until the bleeding has completely stopped.  Attending Physician Documentation: I was present for or performed the following: endometrial biopsy

## 2020-11-14 NOTE — Addendum Note (Signed)
Addended by: Linton Rump on: 11/14/2020 11:57 AM   Modules accepted: Orders

## 2020-11-15 LAB — SURGICAL PATHOLOGY

## 2020-11-20 ENCOUNTER — Other Ambulatory Visit: Payer: Self-pay | Admitting: Family Medicine

## 2020-11-20 DIAGNOSIS — J302 Other seasonal allergic rhinitis: Secondary | ICD-10-CM

## 2020-11-26 ENCOUNTER — Other Ambulatory Visit: Payer: Self-pay | Admitting: Family Medicine

## 2020-11-26 DIAGNOSIS — I10 Essential (primary) hypertension: Secondary | ICD-10-CM

## 2020-11-26 DIAGNOSIS — E119 Type 2 diabetes mellitus without complications: Secondary | ICD-10-CM

## 2020-11-27 ENCOUNTER — Other Ambulatory Visit: Payer: Self-pay | Admitting: Obstetrics & Gynecology

## 2020-11-27 DIAGNOSIS — R87629 Unspecified abnormal cytological findings in specimens from vagina: Secondary | ICD-10-CM

## 2020-11-28 ENCOUNTER — Other Ambulatory Visit: Payer: Self-pay | Admitting: Obstetrics & Gynecology

## 2020-11-28 ENCOUNTER — Encounter: Payer: Self-pay | Admitting: Obstetrics & Gynecology

## 2020-11-28 ENCOUNTER — Ambulatory Visit (INDEPENDENT_AMBULATORY_CARE_PROVIDER_SITE_OTHER): Payer: 59 | Admitting: Obstetrics & Gynecology

## 2020-11-28 ENCOUNTER — Ambulatory Visit (INDEPENDENT_AMBULATORY_CARE_PROVIDER_SITE_OTHER): Payer: 59

## 2020-11-28 ENCOUNTER — Other Ambulatory Visit: Payer: Self-pay

## 2020-11-28 VITALS — BP 174/100 | HR 96 | Ht 70.0 in | Wt 348.5 lb

## 2020-11-28 DIAGNOSIS — N938 Other specified abnormal uterine and vaginal bleeding: Secondary | ICD-10-CM

## 2020-11-28 DIAGNOSIS — R87619 Unspecified abnormal cytological findings in specimens from cervix uteri: Secondary | ICD-10-CM | POA: Diagnosis not present

## 2020-11-28 DIAGNOSIS — N939 Abnormal uterine and vaginal bleeding, unspecified: Secondary | ICD-10-CM

## 2020-11-28 DIAGNOSIS — R87629 Unspecified abnormal cytological findings in specimens from vagina: Secondary | ICD-10-CM

## 2020-11-28 MED ORDER — MEGESTROL ACETATE 40 MG PO TABS: ORAL_TABLET | ORAL | 3 refills | Status: DC

## 2020-11-28 NOTE — Progress Notes (Signed)
Follow up appointment for results  Chief Complaint  Patient presents with   Follow-up    Discuss [place] results and Biopsy results    Blood pressure (!) 174/100, pulse 96, height 5\' 10"  (1.778 m), weight (!) 348 lb 8 oz (158.1 kg).  US PELVIC COMPLETE WITH TRANSVAGINAL  Result Date: 11/28/2020 Images from the original result were not included.  [facility] [address] [place] [address]                                                                                                     GYNECOLOGIC SONOGRAM Vanessa [name] is a 45 y.o. G0P0000 No LMP recorded. She is here for a pelvic sonogram for abnormal uterine bleeding. Uterus                      7.2 x 4.5 x 5.4 cm, Total uterine volume 92 cc,homogeneous anteverted uterus,WNL Endometrium          13.1 mm, symmetrical, WNL Right ovary             3.5 x 2.2 x 3.8 cm, wnl Left ovary                4.3 x 2.7 x 2.6 cm, simple left ovarian cyst 2.5 x 2.3 x 2.4 cm No free fluid Technician Comments: PELVIC US TA/TV:homogeneous anteverted uterus,WNL,EEC 13.1 mm,normal right ovary,simple left ovarian cyst 2.5 x 2.3 x 2.4 cm,no free fluid,no pain during ultrasound,ovaries appear mobile [employer] 11/28/2020 9:38 AM Clinical Impression and recommendations: I have reviewed the sonogram results above, combined with the patient's current clinical course, below are my impressions and any appropriate recommendations for management based on the sonographic findings. Uterus is normal size shape and contour Endometrium is normal for a menstruating woman Both ovaries are normal size shape and morphology, with physioloigc cyst on the left ovary of no clinical concern and does not need interval re evaluation Vanessa [name] 11/28/2020 9:48 AM   Biopsy was benign suggests polyp not seen on sonogram  MEDS ordered this encounter: Meds ordered this encounter  Medications   megestrol (MEGACE) 40 MG tablet    Sig: 3  tablets a day for 5 days, 2 tablets a day for 5 days then 1 tablet daily    Dispense:  45 tablet    Refill:  3    Orders for this encounter: No orders of the defined types were placed in this encounter.   Impression:   ICD-10-CM   1. DUB (dysfunctional uterine bleeding)  N93.8    over the past 12-18 months, prior to that she was typically regular in timing and occasionally heavy so this is a significant change    2. Atypical glandular cells on cervical Pap smear, HPV negative suggests endometrial  R87.619    Sonogram and endometrial biopsy are both normal       Plan: Megestrol algorithm for endometrial synchronization Will follow that with endometrial suppression/cyclical withdrawal  Follow Up: Return in about 3 months (  around 02/28/2021) for [place] visit, with Dr Elonda Husky.     All questions were answered.  Past Medical History:  Diagnosis Date   Hypertension    Migraines    Pseudotumor (inflammatory) of orbit    Sepsis ([place]) 02/2019   Vaginal Pap smear, abnormal    Vision loss     Past Surgical History:  Procedure Laterality Date   CSF SHUNT      OB History     Gravida  0   Para  0   Term  0   Preterm  0   AB  0   Living  0      SAB  0   IAB  0   Ectopic  0   Multiple  0   Live Births  0           Allergies  Allergen Reactions   Metrizamide Other (See Comments), Itching, Photosensitivity, Shortness Of Breath and Swelling    update Migraine instantly   Mushroom Extract Complex Itching    Throat itching with cough   Peanut Oil Anaphylaxis and Other (See Comments)    update   Peanut-Containing Drug Products Itching    Itching throat with cough   Diflucan In Dextrose [Fluconazole In Dextrose] Hives   Iodinated Diagnostic Agents    Iodine-131 Other (See Comments)    update    Social History   Socioeconomic History   Marital status: Single    Spouse name: Not on file   Number of children: Not on file   Years of  education: Not on file   Highest education level: Not on file  Occupational History   Not on file  Tobacco Use   Smoking status: Never   Smokeless tobacco: Never  Vaping Use   Vaping Use: Never used  Substance and Sexual Activity   Alcohol use: No   Drug use: No   Sexual activity: Not Currently    Birth control/protection: Condom  Other Topics Concern   Not on file  Social History Narrative   Lives with parents   Right handed   Caffeine: maybe 2 cups/day   Social Determinants of Health   Financial Resource Strain: Low Risk    Difficulty of Paying Living Expenses: Not very hard  Food Insecurity: Food Insecurity Present   Worried About Running Out of Food in the Last Year: Sometimes true   Ran Out of Food in the Last Year: Sometimes true  Transportation Needs: No Transportation Needs   Lack of Transportation (Medical): No   Lack of Transportation (Non-Medical): No  Physical Activity: Insufficiently Active   Days of Exercise per Week: 5 days   Minutes of Exercise per Session: 10 min  Stress: No Stress Concern Present   Feeling of Stress : Only a little  Social Connections: Moderately Isolated   Frequency of Communication with Friends and Family: More than three times a week   Frequency of Social Gatherings with Friends and Family: More than three times a week   Attends Religious Services: 1 to 4 times per year   Active Member of [employer] or Organizations: No   Attends Archivist Meetings: Never   Marital Status: Never married    Family History  Problem Relation Age of Onset   Pulmonary Hypertension Mother    Congestive Heart Failure Mother    Hypertension Sister    Migraines Neg Hx

## 2020-11-29 NOTE — Patient Instructions (Addendum)
Below is our plan:  We will continue Ajovy and Nurtec. We are going to try to get Botox covered. Continue amitriptyline per PCP direction. Please get exam and have me a report. Follow up with CPAP provider. Please talk with PCP about blood pressures. Work on Publishing rights manager.   Please make sure you are staying well hydrated. I recommend 50-60 ounces daily. Well balanced diet and regular exercise encouraged. Consistent sleep schedule with 6-8 hours recommended.   Please continue follow up with care team as directed.   Follow up with me in 6 months   You may receive a survey regarding today's visit. I encourage you to leave honest feed back as I do use this information to improve patient care. Thank you for seeing me today!

## 2020-11-29 NOTE — Progress Notes (Signed)
Chief Complaint  Patient presents with   Follow-up    Pt alone, new rm. Overall pt states stable. She still battles migraines. She is having 2 migraines a week. States that somewhat better than prior to injection. Has headaches daily.       HISTORY OF PRESENT ILLNESS: 11/30/20 ALL:  Vanessa [name] returns for follow up for migraines. She was switched from Vanessa [name] to Vanessa [name] at last visit 04/2020. Amitriptyline continued per PCP and Vanessa [name] for abortive therapy. She feels that she may be doing a little better on Vanessa [name]. Vanessa [name] works well for abortive therapy. No vision changes or vision loss, some blurred vision with headache. She is scheduled for eye exam next week. She reports that BP has been elevated at home (145/80-90). She relates this to being nervous about a recent biopsy with GYN. She found out yesterday everything was fine with biopsy. She has a hard time sleeping, since childhood. She is on zaleplon. She feels that she just lies awake at night. Usually only gets 4 hours of sleep each night. She reports CBGs are stable. Last A1C 7.0. She feels mood is good on fluoxetine.   From a thorough review of notes patient's been on multiple medications that can be used in iih and migraine management including acetazolamide (made her feels really bad), topiramate (made headaches worse), propranolol (can't remember but reports side effects), amitriptyline (on now), amlodipine, Benadryl, gabapentin, lisinopril, Zofran, promethazine, naratriptan, maxalt, sumatriptan  05/25/2020 ALL:  Vanessa [name] is a 45 y.o. female here today for follow up for headache follow up. 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 MRI 11/2019 was unremarkable. She was started on Vanessa [name] (Vanessa [name] not approved).  Diamox and amitriptyline continued (prescribed by PCP). She reports that around 11/2019 she stopped Diamox due to reports of nausea. She was doing very well from  11/2019 until 04/2020. Over the past month she has noticed worsening headaches. She is having near daily dull, achy headaches in the frontal region. She is having 1-2 migraines a week. She reports smelling diesel fuel before during and after headache. She has nausea and blurred vision with headache. No vision loss in left eye. Blind in right eye. Vanessa [name] helps most with abortive therapy.  A1C at goal. BP has been elevated. She feels that this is due to headaches. She is taking Oxycodone 10mg  PRN (1-2 times daily) for knee pain. She has constipation. She reports neuro opthalmology exam was normal with no concerns of optic nerve edema about 2 months ago.   She was seen 3/25 in the ER for classic migraine aborted with migraine cocktail and Vanessa [name]. CT was normal, stable position of shunt and decompressed ventricles.    HISTORY (copied from Vanessa [name] previous note)  HPI:  Vanessa [name] is a 45 y.o. female here as requested by Vanessa Brooklyn, FNP for migraines.  Patient has a past medical history of IDIOPATHIC INTRACRANIAL HYPERTENSION,  congenital blindness OD, migraines, morbid obesity, hypertension, vision loss, PCOS, diabetes, hyperlipidemia, congenital blindness of right eye, OD glaucoma. 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 There was also a questionable left-sided stenosis seen on MRV but a venous measurement pressure gradient was  insufficient to warrant stenting.  She has been evaluated in the past by Vanessa. Jola Schmidt) as well as Vanessa. Octavia Bruckner Martin(neuro-Ophthalmology) and now she sees a neuro- ophthalmologist elsewhere.   Last time she saw neurosurgery was in 2019 and at that time the shunt was in good working condition. Headaches improved. She is still getting migraines however. Some days it feels like head is going to explode. She has not lost weight. She has daily headaches and migraines 2x a week. She wakes up with headaches, not using her cpap. Positional worsening headache, vision changes and blurry vision.The migraines are throbbing, she may smell finny things prior to migraine. Migraines can last 3 days, severe, at least 15 migraine days a month for over a year, light/sound sensitivity, nausea, no vomiting. Daily headaches. Congenital blindness right eye. Headaches are worse in the morning and with position. No weakness, numbness. No other focal neurologic deficits, associated symptoms, inciting events or modifiable factors.  Last seen by neurosurgery in April 2019.   Reviewed notes, labs and imaging from outside physicians, which showed:   Reviewed imaging from January 2019 CT of the head, interval placement of a left frontal approach VP shunt catheter, slitlike appearance of the lateral and third ventricles.  Evidence of a prior right frontal burr hole site.  XR shunt series showed left VP shunt catheter tubing coursing along the left hemithorax and into the abdomen and terminates into the left lower quadrant, no shunt kinking or discontinuity.    From a thorough review of notes patient's been on multiple medications that can be used in iih and migraine management including acetazolamide, propranolol, amitriptyline, amlodipine, Benadryl, gabapentin, lisinopril, Zofran, promethazine,topamax, naratriptan, maxalt, sumatriptan    REVIEW OF SYSTEMS: Out of a complete 14 system review of symptoms, the patient complains  only of the following symptoms, headaches, chronic knee pain, and all other reviewed systems are negative.   ALLERGIES: Allergies  Allergen Reactions   Metrizamide Other (See Comments), Itching, Photosensitivity, Shortness Of Breath and Swelling    update Migraine instantly   Mushroom Extract Complex Itching    Throat itching with cough   Peanut Oil Anaphylaxis and Other (See Comments)    update   Peanut-Containing Drug Products Itching    Itching throat with cough   Diflucan In Dextrose [Fluconazole In Dextrose] Hives   Iodinated Diagnostic Agents    Iodine-131 Other (See Comments)    update     HOME MEDICATIONS: Outpatient Medications Prior to Visit  Medication Sig Dispense Refill   amitriptyline (ELAVIL) 50 MG tablet Take 1 tablet (50 mg total) by mouth daily. 90 tablet 1   atorvastatin (LIPITOR) 80 MG tablet Take 1 tablet (80 mg total) by mouth daily. 90 tablet 1   cyclobenzaprine (FLEXERIL) 10 MG tablet TAKE 1 TABLET BY MOUTH THREE TIMES A DAY AS NEEDED FOR MUSCLE SPASMS 60 tablet 2   Dulaglutide (TRULICITY) 4.5 LA/4.5XM SOPN Inject 4.5 mg as directed once a week. 2 mL 5   flunisolide (NASALIDE) 25 MCG/ACT (0.025%) SOLN Instill 2 sprays into the nose twice daily. 25 mL 2   FLUoxetine (PROZAC) 20 MG capsule Take 1 capsule by mouth every evening. 30 capsule 3   Fremanezumab-vfrm (Vanessa [name]) 225 MG/1.5ML SOAJ  Inject 225 mg into the skin every 30 (thirty) days. 4.5 mL 3   furosemide (LASIX) 40 MG tablet Take 1 tablet (40 mg total) by mouth daily. 90 tablet 3   glucose blood (ONE TOUCH ULTRA TEST) test strip Test sugar daily Dx E11.9 100 each 3   halobetasol (ULTRAVATE) 0.05 % cream Apply topically 2 (two) times daily. 50 g 5   lisinopril (ZESTRIL) 40 MG tablet Take 1 tablet by mouth daily. 90 tablet 0   loratadine (CLARITIN) 10 MG tablet Take 1 tablet (10 mg total) by mouth daily. 90 tablet 1   megestrol (MEGACE) 40 MG tablet 3 tablets a day for 5 days, 2 tablets a day for 5 days  then 1 tablet daily 45 tablet 3   montelukast (SINGULAIR) 10 MG tablet TAKE 1 TABLET BY MOUTH EVERYDAY AT BEDTIME 90 tablet 0   Multiple Vitamin (MULTIVITAMIN) tablet Take 1 tablet by mouth daily.     naproxen (NAPROSYN) 500 MG tablet Take 500 mg by mouth 2 (two) times daily.     neomycin-polymyxin-hydrocortisone (CORTISPORIN) OTIC solution Place 3 drops into both ears 4 (four) times daily. 10 mL 0   OneTouch Delica Lancets 28B MISC 1 each by Does not apply route daily. Dx E11.9 100 each 3   Oxycodone HCl 10 MG TABS Take 1 tablet (10 mg total) by mouth 2 (two) times daily as needed. 60 tablet 0   [START ON 12/03/2020] Oxycodone HCl 10 MG TABS Take 1 tablet (10 mg total) by mouth 2 (two) times daily as needed. 60 tablet 0   Rimegepant Sulfate (Vanessa [name]) 75 MG TBDP Take 75 mg by mouth daily as needed. Onset migraine (max one daily) 10 tablet 5   spironolactone (ALDACTONE) 100 MG tablet Take 1 tablet (100 mg total) by mouth 2 (two) times daily. 180 tablet 1   TART CHERRY PO Take by mouth.     zaleplon (SONATA) 10 MG capsule Take 1 capsule (10 mg total) by mouth at bedtime as needed for sleep. 30 capsule 5   Oxycodone HCl 10 MG TABS Take 1 tablet (10 mg total) by mouth 2 (two) times daily as needed. 60 tablet 0   No facility-administered medications prior to visit.     PAST MEDICAL HISTORY: Past Medical History:  Diagnosis Date   Hypertension    Migraines    Pseudotumor (inflammatory) of orbit    Sepsis ([place]) 02/2019   Vaginal Pap smear, abnormal    Vision loss      PAST SURGICAL HISTORY: Past Surgical History:  Procedure Laterality Date   CSF SHUNT       FAMILY HISTORY: Family History  Problem Relation Age of Onset   Pulmonary Hypertension Mother    Congestive Heart Failure Mother    Hypertension Sister    Migraines Neg Hx      SOCIAL HISTORY: Social History   Socioeconomic History   Marital status: Single    Spouse name: Not on file   Number of children: Not on file    Years of education: Not on file   Highest education level: Not on file  Occupational History   Not on file  Tobacco Use   Smoking status: Never   Smokeless tobacco: Never  Vaping Use   Vaping Use: Never used  Substance and Sexual Activity   Alcohol use: No   Drug use: No   Sexual activity: Not Currently    Birth control/protection: Condom  Other Topics Concern   Not on  file  Social History Narrative   Lives with parents   Right handed   Caffeine: maybe 2 cups/day   Social Determinants of Health   Financial Resource Strain: Low Risk    Difficulty of Paying Living Expenses: Not very hard  Food Insecurity: Food Insecurity Present   Worried About Running Out of Food in the Last Year: Sometimes true   Ran Out of Food in the Last Year: Sometimes true  Transportation Needs: No Transportation Needs   Lack of Transportation (Medical): No   Lack of Transportation (Non-Medical): No  Physical Activity: Insufficiently Active   Days of Exercise per Week: 5 days   Minutes of Exercise per Session: 10 min  Stress: No Stress Concern Present   Feeling of Stress : Only a little  Social Connections: Moderately Isolated   Frequency of Communication with Friends and Family: More than three times a week   Frequency of Social Gatherings with Friends and Family: More than three times a week   Attends Religious Services: 1 to 4 times per year   Active Member of [employer] or Organizations: No   Attends Archivist Meetings: Never   Marital Status: Never married  Human resources officer Violence: Not At Risk   Fear of Current or Ex-Partner: No   Emotionally Abused: No   Physically Abused: No   Sexually Abused: No      PHYSICAL EXAM  Vitals:   11/30/20 1403  BP: (!) 155/74  Pulse: 99  Weight: (!) 351 lb (159.2 kg)  Height: 5\' 10"  (1.778 m)    Body mass index is 50.36 kg/m.   Generalized: Well developed, in no acute distress  Cardiology: normal rate and rhythm, no murmur  auscultated  Respiratory: clear to auscultation bilaterally    Neurological examination  Mentation: Alert oriented to time, place, history taking. Follows all commands speech and language fluent Cranial nerve II-XII: Pupils were equal round reactive to light. Extraocular movements were full of left eye, visual field were full on confrontational test. Congenital blindness of right eye, outward deviation. Facial sensation and strength were normal. Head turning and shoulder shrug  were normal and symmetric. Motor: The motor testing reveals 5 over 5 strength of all 4 extremities. Good symmetric motor tone is noted throughout.  Gait and station: Gait is normal.     DIAGNOSTIC DATA (LABS, IMAGING, TESTING) - I reviewed patient records, labs, notes, testing and imaging myself where available.  Lab Results  Component Value Date   WBC 7.6 10/04/2020   HGB 11.6 10/04/2020   HCT 36.7 10/04/2020   MCV 79 10/04/2020   PLT 197 10/04/2020      Component Value Date/Time   NA 137 10/04/2020 0930   K 4.2 10/04/2020 0930   CL 102 10/04/2020 0930   CO2 21 10/04/2020 0930   GLUCOSE 115 (H) 10/04/2020 0930   BUN 10 10/04/2020 0930   CREATININE 0.85 10/04/2020 0930   CALCIUM 8.9 10/04/2020 0930   PROT 7.3 10/04/2020 0930   ALBUMIN 3.9 10/04/2020 0930   AST 22 10/04/2020 0930   ALT 20 10/04/2020 0930   ALKPHOS 56 10/04/2020 0930   BILITOT 0.3 10/04/2020 0930   GFRNONAA 79 04/06/2020 1329   GFRAA 91 04/06/2020 1329   Lab Results  Component Value Date   CHOL 143 10/04/2020   HDL 33 (L) 10/04/2020   LDLCALC 87 10/04/2020   TRIG 130 10/04/2020   CHOLHDL 4.3 10/04/2020   Lab Results  Component Value Date   HGBA1C  6.5 10/04/2020   No results found for: VITAMINB12 No results found for: TSH  No flowsheet data found.   No flowsheet data found.   ASSESSMENT AND PLAN  45 y.o. year old female  has a past medical history of Hypertension, Migraines, Pseudotumor (inflammatory) of orbit,  Sepsis ([place]) (02/2019), Vaginal Pap smear, abnormal, and Vision loss. here with   Other migraine without status migrainosus, not intractable  Victoriya has tolerated Vanessa [name] better than Vanessa [name] but reports that she continues to have daily headaches with at least 8-12 migrainous days. We have discussed potential causes including worsening IIH, BP elevation, inconsistent use of CPAP, weather changes, stress, etc. Eye exam was reportedly normal in 02/2020, next appt 12/05/2020. She is on Lasix and spironolactone. She did not feel well on Diamox or topiramate. She will continue Vanessa [name] for prevention. Amitriptyline per PCP recommendation. We will continue Vanessa [name] for abortive therapy. I will see if we can get Botox covered for her. We have discussed benefits and risks as well as the procedure in detail. She is in agreement to start Botox therapy if covered. Healthy lifestyle habits encouraged. She will monitor BP closely. She will follow up in 3-6 months. She verbalizes understanding and agreement with this plan.   No orders of the defined types were placed in this encounter.    No orders of the defined types were placed in this encounter.    [name], MSN, FNP-C 11/30/2020, 2:07 PM  [facility] [address] [place], [place] [address] [phone]

## 2020-11-30 ENCOUNTER — Encounter: Payer: Self-pay | Admitting: Family Medicine

## 2020-11-30 ENCOUNTER — Other Ambulatory Visit: Payer: Self-pay

## 2020-11-30 ENCOUNTER — Ambulatory Visit (INDEPENDENT_AMBULATORY_CARE_PROVIDER_SITE_OTHER): Payer: 59 | Admitting: Family Medicine

## 2020-11-30 VITALS — BP 155/74 | HR 99 | Ht 70.0 in | Wt 351.0 lb

## 2020-11-30 DIAGNOSIS — G43809 Other migraine, not intractable, without status migrainosus: Secondary | ICD-10-CM | POA: Diagnosis not present

## 2020-12-08 ENCOUNTER — Other Ambulatory Visit: Payer: Self-pay | Admitting: *Deleted

## 2020-12-08 DIAGNOSIS — I10 Essential (primary) hypertension: Secondary | ICD-10-CM

## 2020-12-08 MED ORDER — SPIRONOLACTONE 100 MG PO TABS: 100.0000 mg | ORAL_TABLET | Freq: Two times a day (BID) | ORAL | 0 refills | Status: DC

## 2020-12-13 ENCOUNTER — Telehealth: Payer: Self-pay | Admitting: Family Medicine

## 2020-12-13 NOTE — Telephone Encounter (Signed)
[name], NP  Marvis Repress. I would like to start Botox on this patient. Can we check to see if we can get it covered?

## 2020-12-13 NOTE — Telephone Encounter (Signed)
Spoke with patient, she agreed to come in 11/7 @ 10:00 for first injection. Patient had no questions.  Patient has [facility] Medicare which does not require PA at this time. Dx- G43.709, G43.809.

## 2020-12-20 ENCOUNTER — Encounter: Payer: Self-pay | Admitting: Family Medicine

## 2020-12-20 NOTE — Telephone Encounter (Signed)
[facility] Medicare will require PA for Botox beginning 12/26/20. Patient's appointment is 11/7. I initiated PA for Botox (H4765) every 12 weeks for G43.709 via [place] portal. PA was approved, PA #[id number] (12/20/20- 12/20/21). Patient will use office stock.

## 2020-12-21 ENCOUNTER — Telehealth: Payer: Self-pay | Admitting: Family Medicine

## 2020-12-22 NOTE — Telephone Encounter (Signed)
PT SCHEDULED

## 2020-12-25 ENCOUNTER — Other Ambulatory Visit: Payer: Self-pay

## 2020-12-25 ENCOUNTER — Ambulatory Visit (INDEPENDENT_AMBULATORY_CARE_PROVIDER_SITE_OTHER): Payer: 59 | Admitting: Nurse Practitioner

## 2020-12-25 ENCOUNTER — Encounter: Payer: Self-pay | Admitting: Nurse Practitioner

## 2020-12-25 ENCOUNTER — Other Ambulatory Visit: Payer: Self-pay | Admitting: Family Medicine

## 2020-12-25 VITALS — BP 135/76 | HR 96 | Temp 97.6°F | Resp 20 | Ht 70.0 in | Wt 337.0 lb

## 2020-12-25 DIAGNOSIS — L03115 Cellulitis of right lower limb: Secondary | ICD-10-CM | POA: Diagnosis not present

## 2020-12-25 DIAGNOSIS — Z09 Encounter for follow-up examination after completed treatment for conditions other than malignant neoplasm: Secondary | ICD-10-CM | POA: Diagnosis not present

## 2020-12-25 DIAGNOSIS — J302 Other seasonal allergic rhinitis: Secondary | ICD-10-CM

## 2020-12-25 NOTE — Assessment & Plan Note (Signed)
Completed hospital discharge follow-up.  Education provided to patient with printed handouts given.  Medication reconciliation completed.  All symptoms resolved.  Follow-up as needed.

## 2020-12-25 NOTE — Assessment & Plan Note (Signed)
Reassessment cellulitis of the right lower leg follow-up after hospital discharge.  No new concerns.  Completed education on diabetic management due to high blood pressures in the past few days.  Advised patient to continue Trulicity weekly and monitor blood sugar until therapeutic.  Advised patient please keep appointment with PCP coming up November 10th  Patient verbalized understanding.

## 2020-12-25 NOTE — Patient Instructions (Signed)
Diabetes Mellitus Basics Diabetes mellitus, or diabetes, is a long-term (chronic) disease. It occurs when the body does not properly use sugar (glucose) that is released from food after you eat. Diabetes mellitus may be caused by one or both of these problems: Your pancreas does not make enough of a hormone called insulin. Your body does not react in a normal way to the insulin that it makes. Insulin lets glucose enter cells in your body. This gives you energy. If you have diabetes, glucose cannot get into cells. This causes high blood glucose (hyperglycemia). How to treat and manage diabetes You may need to take insulin or other diabetes medicines daily to keep your glucose in balance. If you are prescribed insulin, you will learn how to give yourself insulin by injection. You may need to adjust the amount of insulin you take based on the foods that you eat. You will need to check your blood glucose levels using a glucose monitor as told by your health care provider. The readings can help determine if you have low or high blood glucose. Generally, you should have these blood glucose levels: Before meals (preprandial): 80-130 mg/dL (4.4-7.2 mmol/L). After meals (postprandial): below 180 mg/dL (10 mmol/L). Hemoglobin A1c (HbA1c) level: less than 7%. Your health care provider will set treatment goals for you. Keep all follow-up visits. This is important. Follow these instructions at home: Diabetes medicines Take your diabetes medicines every day as told by your health care provider. List your diabetes medicines here: Name of medicine: ______________________________ Amount (dose): _______________ Time (a.m./p.m.): _______________ Notes: ___________________________________ Name of medicine: ______________________________ Amount (dose): _______________ Time (a.m./p.m.): _______________ Notes: ___________________________________ Name of medicine: ______________________________ Amount (dose):  _______________ Time (a.m./p.m.): _______________ Notes: ___________________________________ Insulin If you use insulin, list the types of insulin you use here: Insulin type: ______________________________ Amount (dose): _______________ Time (a.m./p.m.): _______________Notes: ___________________________________ Insulin type: ______________________________ Amount (dose): _______________ Time (a.m./p.m.): _______________ Notes: ___________________________________ Insulin type: ______________________________ Amount (dose): _______________ Time (a.m./p.m.): _______________ Notes: ___________________________________ Insulin type: ______________________________ Amount (dose): _______________ Time (a.m./p.m.): _______________ Notes: ___________________________________ Insulin type: ______________________________ Amount (dose): _______________ Time (a.m./p.m.): _______________ Notes: ___________________________________ Managing blood glucose Check your blood glucose levels using a glucose monitor as told by your health care provider. Write down the times that you check your glucose levels here: Time: _______________ Notes: ___________________________________ Time: _______________ Notes: ___________________________________ Time: _______________ Notes: ___________________________________ Time: _______________ Notes: ___________________________________ Time: _______________ Notes: ___________________________________ Time: _______________ Notes: ___________________________________  Low blood glucose Low blood glucose (hypoglycemia) is when glucose is at or below 70 mg/dL (3.9 mmol/L). Symptoms may include: Feeling: Hungry. Sweaty and clammy. Irritable or easily upset. Dizzy. Sleepy. Having: A fast heartbeat. A headache. A change in your vision. Numbness around the mouth, lips, or tongue. Having trouble with: Moving (coordination). Sleeping. Treating low blood glucose To treat low blood  glucose, eat or drink something containing sugar right away. If you can think clearly and swallow safely, follow the 15:15 rule: Take 15 grams of a fast-acting carb (carbohydrate), as told by your health care provider. Some fast-acting carbs are: Glucose tablets: take 3-4 tablets. Hard candy: eat 3-5 pieces. Fruit juice: drink 4 oz (120 mL). Regular (not diet) soda: drink 4-6 oz (120-180 mL). Honey or sugar: eat 1 Tbsp (15 mL). Check your blood glucose levels 15 minutes after you take the carb. If your glucose is still at or below 70 mg/dL (3.9 mmol/L), take 15 grams of a carb again. If your glucose does not go above 70 mg/dL (3.9 mmol/L) after 3 tries,  get help right away. After your glucose goes back to normal, eat a meal or a snack within 1 hour. Treating very low blood glucose If your glucose is at or below 54 mg/dL (3 mmol/L), you have very low blood glucose (severe hypoglycemia). This is an emergency. Do not wait to see if the symptoms will go away. Get medical help right away. Call your local emergency services (911 in the [place] Do not drive yourself to the hospital. Questions to ask your health care provider Should I talk with a diabetes educator? What equipment will I need to care for myself at home? What diabetes medicines do I need? When should I take them? How often do I need to check my blood glucose levels? What number can I call if I have questions? When is my follow-up visit? Where can I find a support group for people with diabetes? Where to find more information American Diabetes Association: www.diabetes.org Association of Diabetes Care and Education Specialists: www.diabeteseducator.org Contact a health care provider if: Your blood glucose is at or above 240 mg/dL (13.3 mmol/L) for 2 days in a row. You have been sick or have had a fever for 2 days or more, and you are not getting better. You have any of these problems for more than 6 hours: You cannot eat or  drink. You feel nauseous. You vomit. You have diarrhea. Get help right away if: Your blood glucose is lower than 54 mg/dL (3 mmol/L). You get confused. You have trouble thinking clearly. You have trouble breathing. These symptoms may represent a serious problem that is an emergency. Do not wait to see if the symptoms will go away. Get medical help right away. Call your local emergency services (911 in the [place] Do not drive yourself to the hospital. Summary Diabetes mellitus is a chronic disease that occurs when the body does not properly use sugar (glucose) that is released from food after you eat. Take insulin and diabetes medicines as told. Check your blood glucose every day, as often as told. Keep all follow-up visits. This is important. This information is not intended to replace advice given to you by your health care provider. Make sure you discuss any questions you have with your health care provider. Document Revised: 06/15/2019 Document Reviewed: 06/15/2019 Elsevier Patient Education  2022 [place]. Cellulitis, Adult Cellulitis is a skin infection. The infected area is often warm, red, swollen, and sore. It occurs most often in the arms and lower legs. It is very important to get treated for this condition. What are the causes? This condition is caused by bacteria. The bacteria enter through a break in the skin, such as a cut, burn, insect bite, open sore, or crack. What increases the risk? This condition is more likely to occur in people who: Have a weak body defense system (immune system). Have open cuts, burns, bites, or scrapes on the skin. Are older than 44 years of age. Have a blood sugar problem (diabetes). Have a long-lasting (chronic) liver disease (cirrhosis) or kidney disease. Are very overweight (obese). Have a skin problem, such as: Itchy rash (eczema). Slow movement of blood in the veins (venous stasis). Fluid buildup below the skin (edema). Have been  treated with high-energy rays (radiation). Use IV drugs. What are the signs or symptoms? Symptoms of this condition include: Skin that is: Red. Streaking. Spotting. Swollen. Sore or painful when you touch it. Warm. A fever. Chills. Blisters. How is this diagnosed? This condition is diagnosed based on:  Medical history. Physical exam. Blood tests. Imaging tests. How is this treated? Treatment for this condition may include: Medicines to treat infections or allergies. Home care, such as: Rest. Placing cold or warm cloths (compresses) on the skin. Hospital care, if the condition is very bad. Follow these instructions at home: Medicines Take over-the-counter and prescription medicines only as told by your doctor. If you were prescribed an antibiotic medicine, take it as told by your doctor. Do not stop taking it even if you start to feel better. General instructions  Drink enough fluid to keep your pee (urine) pale yellow. Do not touch or rub the infected area. Raise (elevate) the infected area above the level of your heart while you are sitting or lying down. Place cold or warm cloths on the area as told by your doctor. Keep all follow-up visits as told by your doctor. This is important. Contact a doctor if: You have a fever. You do not start to get better after 1-2 days of treatment. Your bone or joint under the infected area starts to hurt after the skin has healed. Your infection comes back. This can happen in the same area or another area. You have a swollen bump in the area. You have new symptoms. You feel ill and have muscle aches and pains. Get help right away if: Your symptoms get worse. You feel very sleepy. You throw up (vomit) or have watery poop (diarrhea) for a long time. You see red streaks coming from the area. Your red area gets larger. Your red area turns dark in color. These symptoms may represent a serious problem that is an emergency. Do not wait  to see if the symptoms will go away. Get medical help right away. Call your local emergency services (911 in the [place] Do not drive yourself to the hospital. Summary Cellulitis is a skin infection. The area is often warm, red, swollen, and sore. This condition is treated with medicines, rest, and cold and warm cloths. Take all medicines only as told by your doctor. Tell your doctor if symptoms do not start to get better after 1-2 days of treatment. This information is not intended to replace advice given to you by your health care provider. Make sure you discuss any questions you have with your health care provider. Document Revised: 07/03/2017 Document Reviewed: 07/03/2017 Elsevier Patient Education  [place].

## 2020-12-25 NOTE — Progress Notes (Signed)
Established Patient Office Visit  Subjective:  Patient ID: Vanessa [name], female    DOB: 05/09/1975  Age: 45 y.o. MRN: [id number]  CC:  Chief Complaint  Patient presents with   Hospitalization Follow-up    HPI [name] presents for hospital follow-up for cellulitis/sepsis.  Patient is reporting all symptoms resolved.  Patient is also reporting hyperglycemia since after discharge from the hospital due to prior steroids received during treatments.  No nausea, vomiting, headache or fever associated with current complaint.  Past Medical History:  Diagnosis Date   Hypertension    Migraines    Pseudotumor (inflammatory) of orbit    Sepsis ([place]) 02/2019   Vaginal Pap smear, abnormal    Vision loss     Past Surgical History:  Procedure Laterality Date   CSF SHUNT      Family History  Problem Relation Age of Onset   Pulmonary Hypertension Mother    Congestive Heart Failure Mother    Hypertension Sister    Migraines Neg Hx     Social History   Socioeconomic History   Marital status: Single    Spouse name: Not on file   Number of children: Not on file   Years of education: Not on file   Highest education level: Not on file  Occupational History   Not on file  Tobacco Use   Smoking status: Never   Smokeless tobacco: Never  Vaping Use   Vaping Use: Never used  Substance and Sexual Activity   Alcohol use: No   Drug use: No   Sexual activity: Not Currently    Birth control/protection: Condom  Other Topics Concern   Not on file  Social History Narrative   Lives with parents   Right handed   Caffeine: maybe 2 cups/day   Social Determinants of Health   Financial Resource Strain: Low Risk    Difficulty of Paying Living Expenses: Not very hard  Food Insecurity: Food Insecurity Present   Worried About Running Out of Food in the Last Year: Sometimes true   Ran Out of Food in the Last Year: Sometimes true  Transportation Needs: No Transportation Needs   Lack  of Transportation (Medical): No   Lack of Transportation (Non-Medical): No  Physical Activity: Insufficiently Active   Days of Exercise per Week: 5 days   Minutes of Exercise per Session: 10 min  Stress: No Stress Concern Present   Feeling of Stress : Only a little  Social Connections: Moderately Isolated   Frequency of Communication with Friends and Family: More than three times a week   Frequency of Social Gatherings with Friends and Family: More than three times a week   Attends Religious Services: 1 to 4 times per year   Active Member of [employer] or Organizations: No   Attends Archivist Meetings: Never   Marital Status: Never married  Human resources officer Violence: Not At Risk   Fear of Current or Ex-Partner: No   Emotionally Abused: No   Physically Abused: No   Sexually Abused: No    Outpatient Medications Prior to Visit  Medication Sig Dispense Refill   amitriptyline (ELAVIL) 50 MG tablet Take 1 tablet (50 mg total) by mouth daily. 90 tablet 1   atorvastatin (LIPITOR) 80 MG tablet Take 1 tablet (80 mg total) by mouth daily. 90 tablet 1   cyclobenzaprine (FLEXERIL) 10 MG tablet TAKE 1 TABLET BY MOUTH THREE TIMES A DAY AS NEEDED FOR MUSCLE SPASMS 60 tablet 2   Dulaglutide (  TRULICITY) 4.5 MG/0.5ML SOPN Inject 4.5 mg as directed once a week. 2 mL 5   FLUoxetine (PROZAC) 20 MG capsule Take 1 capsule by mouth every evening. 30 capsule 3   Fremanezumab-vfrm (AJOVY) 225 MG/1.5ML SOAJ Inject 225 mg into the skin every 30 (thirty) days. 4.5 mL 3   furosemide (LASIX) 40 MG tablet Take 1 tablet (40 mg total) by mouth daily. 90 tablet 3   glucose blood (ONE TOUCH ULTRA TEST) test strip Test sugar daily Dx E11.9 100 each 3   lisinopril (ZESTRIL) 40 MG tablet Take 1 tablet by mouth daily. 90 tablet 0   loratadine (CLARITIN) 10 MG tablet Take 1 tablet (10 mg total) by mouth daily. 90 tablet 1   megestrol (MEGACE) 40 MG tablet 3 tablets a day for 5 days, 2 tablets a day for 5 days then 1  tablet daily 45 tablet 3   montelukast (SINGULAIR) 10 MG tablet TAKE 1 TABLET BY MOUTH EVERYDAY AT BEDTIME 90 tablet 0   Multiple Vitamin (MULTIVITAMIN) tablet Take 1 tablet by mouth daily.     OneTouch Delica Lancets 30G MISC 1 each by Does not apply route daily. Dx E11.9 100 each 3   Oxycodone HCl 10 MG TABS Take 1 tablet (10 mg total) by mouth 2 (two) times daily as needed. 60 tablet 0   Oxycodone HCl 10 MG TABS Take 1 tablet (10 mg total) by mouth 2 (two) times daily as needed. 60 tablet 0   Rimegepant Sulfate (NURTEC) 75 MG TBDP Take 75 mg by mouth daily as needed. Onset migraine (max one daily) 10 tablet 5   spironolactone (ALDACTONE) 100 MG tablet Take 1 tablet (100 mg total) by mouth 2 (two) times daily. 180 tablet 0   TART CHERRY PO Take by mouth.     zaleplon (SONATA) 10 MG capsule Take 1 capsule (10 mg total) by mouth at bedtime as needed for sleep. 30 capsule 5   flunisolide (NASALIDE) 25 MCG/ACT (0.025%) SOLN Instill 2 sprays into the nose twice daily. 25 mL 2   halobetasol (ULTRAVATE) 0.05 % cream Apply topically 2 (two) times daily. (Patient not taking: Reported on 12/25/2020) 50 g 5   naproxen (NAPROSYN) 500 MG tablet Take 500 mg by mouth 2 (two) times daily. (Patient not taking: Reported on 12/25/2020)     neomycin-polymyxin-hydrocortisone (CORTISPORIN) OTIC solution Place 3 drops into both ears 4 (four) times daily. (Patient not taking: Reported on 12/25/2020) 10 mL 0   No facility-administered medications prior to visit.    Allergies  Allergen Reactions   Metrizamide Other (See Comments), Itching, Photosensitivity, Shortness Of Breath and Swelling    update Migraine instantly   Mushroom Extract Complex Itching    Throat itching with cough   Peanut Oil Anaphylaxis and Other (See Comments)    update   Peanut-Containing Drug Products Itching    Itching throat with cough   Diflucan In Dextrose [Fluconazole In Dextrose] Hives   Iodinated Diagnostic Agents    Iodine-131  Other (See Comments)    update    ROS Review of Systems  Constitutional: Negative.   HENT: Negative.    Eyes: Negative.   Respiratory: Negative.    Gastrointestinal: Negative.   Musculoskeletal: Negative.  Negative for arthralgias and myalgias.  Skin:  Negative for rash and wound.  All other systems reviewed and are negative.    Objective:    Physical Exam Vitals and nursing note reviewed.  HENT:     Head: Normocephalic.  Nose: Nose normal.     Mouth/Throat:     Mouth: Mucous membranes are moist.     Pharynx: Oropharynx is clear.  Eyes:     Conjunctiva/sclera: Conjunctivae normal.  Cardiovascular:     Rate and Rhythm: Normal rate and regular rhythm.     Pulses: Normal pulses.     Heart sounds: Normal heart sounds.  Pulmonary:     Effort: Pulmonary effort is normal.     Breath sounds: Normal breath sounds.  Abdominal:     General: Bowel sounds are normal.  Musculoskeletal:        General: Normal range of motion.  Skin:    General: Skin is warm.     Findings: No rash.  Neurological:     Mental Status: She is alert and oriented to person, place, and time.    BP 135/76   Pulse 96   Temp 97.6 F (36.4 C) (Temporal)   Resp 20   Ht 5\' 10"  (1.778 m)   Wt (!) 337 lb (152.9 kg)   SpO2 98%   BMI 48.35 kg/m  Wt Readings from Last 3 Encounters:  12/25/20 (!) 337 lb (152.9 kg)  11/30/20 (!) 351 lb (159.2 kg)  11/28/20 (!) 348 lb 8 oz (158.1 kg)     Health Maintenance Due  Topic Date Due   COVID-19 Vaccine (4 - Booster for Moderna series) 04/10/2020    There are no preventive care reminders to display for this patient.  No results found for: TSH Lab Results  Component Value Date   WBC 7.6 10/04/2020   HGB 11.6 10/04/2020   HCT 36.7 10/04/2020   MCV 79 10/04/2020   PLT 197 10/04/2020   Lab Results  Component Value Date   NA 137 10/04/2020   K 4.2 10/04/2020   CO2 21 10/04/2020   GLUCOSE 115 (H) 10/04/2020   BUN 10 10/04/2020   CREATININE 0.85  10/04/2020   BILITOT 0.3 10/04/2020   ALKPHOS 56 10/04/2020   AST 22 10/04/2020   ALT 20 10/04/2020   PROT 7.3 10/04/2020   ALBUMIN 3.9 10/04/2020   CALCIUM 8.9 10/04/2020   EGFR 87 10/04/2020   Lab Results  Component Value Date   CHOL 143 10/04/2020   Lab Results  Component Value Date   HDL 33 (L) 10/04/2020   Lab Results  Component Value Date   LDLCALC 87 10/04/2020   Lab Results  Component Value Date   TRIG 130 10/04/2020   Lab Results  Component Value Date   CHOLHDL 4.3 10/04/2020   Lab Results  Component Value Date   HGBA1C 6.5 10/04/2020      Assessment & Plan:   Problem List Items Addressed This Visit       Other   Hospital discharge follow-up - Primary    Completed hospital discharge follow-up.  Education provided to patient with printed handouts given.  Medication reconciliation completed.  All symptoms resolved.  Follow-up as needed.      Cellulitis of right lower leg    Reassessment cellulitis of the right lower leg follow-up after hospital discharge.  No new concerns.  Completed education on diabetic management due to high blood pressures in the past few days.  Advised patient to continue Trulicity weekly and monitor blood sugar until therapeutic.  Advised patient please keep appointment with PCP coming up November 10th  Patient verbalized understanding.       No orders of the defined types were placed in this encounter.  Follow-up: Return if symptoms worsen or fail to improve.    Ivy Lynn, NP

## 2020-12-30 ENCOUNTER — Other Ambulatory Visit: Payer: Self-pay | Admitting: Family Medicine

## 2020-12-30 DIAGNOSIS — J302 Other seasonal allergic rhinitis: Secondary | ICD-10-CM

## 2021-01-01 ENCOUNTER — Ambulatory Visit (INDEPENDENT_AMBULATORY_CARE_PROVIDER_SITE_OTHER): Payer: 59 | Admitting: Family Medicine

## 2021-01-01 DIAGNOSIS — G43709 Chronic migraine without aura, not intractable, without status migrainosus: Secondary | ICD-10-CM | POA: Diagnosis not present

## 2021-01-01 NOTE — Progress Notes (Signed)
Botox- 200 units x 1 vial Lot: [id number] Expiration: 04/25 NDC: 4163-8453-64  0.9% Sodium Chloride- 22mL total Lot: [id number] Expiration: 12/23 NDC: 24825-003-70  Dx: W88.891 B/B

## 2021-01-01 NOTE — Progress Notes (Signed)
01/01/21 ALL: Vanessa [name] presents for her first Botox procedure. Baseline daily headaches with 8-12 migrainous headaches per month. She continues Ajovy for migraine prevention. Amitriptyline prescribed by PCP. Nurtec helps with abortive therapy.     Consent Form Botulism Toxin Injection For Chronic Migraine    Reviewed orally with patient, additionally signature is on file:  Botulism toxin has been approved by the Federal drug administration for treatment of chronic migraine. Botulism toxin does not cure chronic migraine and it may not be effective in some patients.  The administration of botulism toxin is accomplished by injecting a small amount of toxin into the muscles of the neck and head. Dosage must be titrated for each individual. Any benefits resulting from botulism toxin tend to wear off after 3 months with a repeat injection required if benefit is to be maintained. Injections are usually done every 3-4 months with maximum effect peak achieved by about 2 or 3 weeks. Botulism toxin is expensive and you should be sure of what costs you will incur resulting from the injection.  The side effects of botulism toxin use for chronic migraine may include:   -Transient, and usually mild, facial weakness with facial injections  -Transient, and usually mild, head or neck weakness with head/neck injections  -Reduction or loss of forehead facial animation due to forehead muscle weakness  -Eyelid drooping  -Dry eye  -Pain at the site of injection or bruising at the site of injection  -Double vision  -Potential unknown long term risks   Contraindications: You should not have Botox if you are pregnant, nursing, allergic to albumin, have an infection, skin condition, or muscle weakness at the site of the injection, or have myasthenia gravis, Lambert-Eaton syndrome, or ALS.  It is also possible that as with any injection, there may be an allergic reaction or no effect from the medication. Reduced  effectiveness after repeated injections is sometimes seen and rarely infection at the injection site may occur. All care will be taken to prevent these side effects. If therapy is given over a long time, atrophy and wasting in the muscle injected may occur. Occasionally the patient's become refractory to treatment because they develop antibodies to the toxin. In this event, therapy needs to be modified.  I have read the above information and consent to the administration of botulism toxin.    BOTOX PROCEDURE NOTE FOR MIGRAINE HEADACHE  Contraindications and precautions discussed with patient(above). Aseptic procedure was observed and patient tolerated procedure. Procedure performed by [name], FNP-C.   The condition has existed for more than 6 months, and pt does not have a diagnosis of ALS, Myasthenia Gravis or Lambert-Eaton Syndrome.  Risks and benefits of injections discussed and pt agrees to proceed with the procedure.  Written consent obtained  These injections are medically necessary. Pt  receives good benefits from these injections. These injections do not cause sedations or hallucinations which the oral therapies may cause.   Description of procedure:  The patient was placed in a sitting position. The standard protocol was used for Botox as follows, with 5 units of Botox injected at each site:  -Procerus muscle, midline injection  -Corrugator muscle, bilateral injection  -Frontalis muscle, bilateral injection, with 2 sites each side, medial injection was performed in the upper one third of the frontalis muscle, in the region vertical from the medial inferior edge of the superior orbital rim. The lateral injection was again in the upper one third of the forehead vertically above the lateral  limbus of the cornea, 1.5 cm lateral to the medial injection site.  -Temporalis muscle injection, 4 sites, bilaterally. The first injection was 3 cm above the tragus of the ear, second injection  site was 1.5 cm to 3 cm up from the first injection site in line with the tragus of the ear. The third injection site was 1.5-3 cm forward between the first 2 injection sites. The fourth injection site was 1.5 cm posterior to the second injection site. 5th site laterally in the temporalis  muscleat the level of the outer canthus.  -Occipitalis muscle injection, 3 sites, bilaterally. The first injection was done one half way between the occipital protuberance and the tip of the mastoid process behind the ear. The second injection site was done lateral and superior to the first, 1 fingerbreadth from the first injection. The third injection site was 1 fingerbreadth superiorly and medially from the first injection site.  -Cervical paraspinal muscle injection, 2 sites, bilaterally. The first injection site was 1 cm from the midline of the cervical spine, 3 cm inferior to the lower border of the occipital protuberance. The second injection site was 1.5 cm superiorly and laterally to the first injection site.  -Trapezius muscle injection was performed at 3 sites, bilaterally. The first injection site was in the upper trapezius muscle halfway between the inflection point of the neck, and the acromion. The second injection site was one half way between the acromion and the first injection site. The third injection was done between the first injection site and the inflection point of the neck.   Will return for repeat injection in 3 months.   A total of 200 units of Botox was prepared, 155 units of Botox was injected as documented above, any Botox not injected was wasted. The patient tolerated the procedure well, there were no complications of the above procedure.

## 2021-01-04 ENCOUNTER — Encounter: Payer: Self-pay | Admitting: Family Medicine

## 2021-01-04 ENCOUNTER — Ambulatory Visit (INDEPENDENT_AMBULATORY_CARE_PROVIDER_SITE_OTHER): Payer: 59 | Admitting: Family Medicine

## 2021-01-04 ENCOUNTER — Other Ambulatory Visit: Payer: Self-pay

## 2021-01-04 VITALS — BP 139/81 | HR 107 | Temp 97.7°F | Ht 70.0 in | Wt 339.4 lb

## 2021-01-04 DIAGNOSIS — Z9989 Dependence on other enabling machines and devices: Secondary | ICD-10-CM

## 2021-01-04 DIAGNOSIS — E1169 Type 2 diabetes mellitus with other specified complication: Secondary | ICD-10-CM | POA: Diagnosis not present

## 2021-01-04 DIAGNOSIS — Z79899 Other long term (current) drug therapy: Secondary | ICD-10-CM

## 2021-01-04 DIAGNOSIS — G43709 Chronic migraine without aura, not intractable, without status migrainosus: Secondary | ICD-10-CM

## 2021-01-04 DIAGNOSIS — Z6841 Body Mass Index (BMI) 40.0 and over, adult: Secondary | ICD-10-CM

## 2021-01-04 DIAGNOSIS — M542 Cervicalgia: Secondary | ICD-10-CM | POA: Insufficient documentation

## 2021-01-04 DIAGNOSIS — J302 Other seasonal allergic rhinitis: Secondary | ICD-10-CM

## 2021-01-04 DIAGNOSIS — G4733 Obstructive sleep apnea (adult) (pediatric): Secondary | ICD-10-CM

## 2021-01-04 DIAGNOSIS — E1165 Type 2 diabetes mellitus with hyperglycemia: Secondary | ICD-10-CM | POA: Insufficient documentation

## 2021-01-04 DIAGNOSIS — E785 Hyperlipidemia, unspecified: Secondary | ICD-10-CM

## 2021-01-04 DIAGNOSIS — F5101 Primary insomnia: Secondary | ICD-10-CM

## 2021-01-04 DIAGNOSIS — I10 Essential (primary) hypertension: Secondary | ICD-10-CM

## 2021-01-04 DIAGNOSIS — M170 Bilateral primary osteoarthritis of knee: Secondary | ICD-10-CM

## 2021-01-04 DIAGNOSIS — Z1159 Encounter for screening for other viral diseases: Secondary | ICD-10-CM

## 2021-01-04 LAB — BAYER DCA HB A1C WAIVED: HB A1C (BAYER DCA - WAIVED): 10.2 % — ABNORMAL HIGH (ref 4.8–5.6)

## 2021-01-04 MED ORDER — OXYCODONE HCL 10 MG PO TABS: 10.0000 mg | ORAL_TABLET | Freq: Two times a day (BID) | ORAL | 0 refills | Status: DC | PRN

## 2021-01-04 MED ORDER — FUROSEMIDE 40 MG PO TABS: 40.0000 mg | ORAL_TABLET | Freq: Every day | ORAL | 1 refills | Status: DC

## 2021-01-04 MED ORDER — TRAZODONE HCL 50 MG PO TABS
50.0000 mg | ORAL_TABLET | Freq: Every evening | ORAL | 3 refills | Status: DC | PRN
Start: 2021-01-04 — End: 2021-02-27

## 2021-01-04 MED ORDER — METOPROLOL SUCCINATE ER 25 MG PO TB24: 25.0000 mg | ORAL_TABLET | Freq: Every day | ORAL | 2 refills | Status: DC

## 2021-01-04 MED ORDER — CYCLOBENZAPRINE HCL 10 MG PO TABS: ORAL_TABLET | ORAL | 2 refills | Status: DC

## 2021-01-04 MED ORDER — AMITRIPTYLINE HCL 50 MG PO TABS: 50.0000 mg | ORAL_TABLET | Freq: Every day | ORAL | 1 refills | Status: DC

## 2021-01-04 MED ORDER — ATORVASTATIN CALCIUM 80 MG PO TABS: 80.0000 mg | ORAL_TABLET | Freq: Every day | ORAL | 1 refills | Status: DC

## 2021-01-04 MED ORDER — MONTELUKAST SODIUM 10 MG PO TABS: ORAL_TABLET | ORAL | 1 refills | Status: DC

## 2021-01-04 MED ORDER — EMPAGLIFLOZIN 10 MG PO TABS: 10.0000 mg | ORAL_TABLET | Freq: Every day | ORAL | 2 refills | Status: DC

## 2021-01-04 NOTE — Progress Notes (Signed)
Assessment & Plan:  1. Type 2 diabetes mellitus with hyperglycemia, without long-term current use of insulin (HCC) Lab Results  Component Value Date   HGBA1C 6.5 10/04/2020   HGBA1C 7.1 (H) 07/04/2020   HGBA1C 6.9 04/06/2020  A1c 10.2 today which has significantly increased. - Diabetes is not at goal of A1c < 7. - Medications: continue current medications, add Jardiance - Home glucose monitoring: continue monitoring. - Patient is currently taking a statin. Patient is taking an ACE-inhibitor/ARB.  - Instruction/counseling given: reminded to bring blood glucose meter & log to each visit and discussed diet  Diabetes Health Maintenance Due  Topic Date Due   OPHTHALMOLOGY EXAM  02/07/2021   FOOT EXAM  04/06/2021   HEMOGLOBIN A1C  04/06/2021    Lab Results  Component Value Date   LABMICR See below: 01/27/2020   LABMICR 19.5 07/06/2019   - Lipid panel - CBC with Differential/Platelet - CMP14+EGFR - Bayer DCA Hb A1c Waived - atorvastatin (LIPITOR) 80 MG tablet; Take 1 tablet (80 mg total) by mouth daily.  Dispense: 90 tablet; Refill: 1 - empagliflozin (JARDIANCE) 10 MG TABS tablet; Take 1 tablet (10 mg total) by mouth daily before breakfast.  Dispense: 30 tablet; Refill: 2  2. Hyperlipidemia associated with type 2 diabetes mellitus ([place]) Well controlled on current regimen.  - Lipid panel - CMP14+EGFR - atorvastatin (LIPITOR) 80 MG tablet; Take 1 tablet (80 mg total) by mouth daily.  Dispense: 90 tablet; Refill: 1  3. Essential hypertension Uncontrolled. Continue furosemide, lisinopril, and spironolactone. Add metoprolol. Encouraged to keep a log of blood pressure and bring it to her next appointment.  - Lipid panel - CBC with Differential/Platelet - CMP14+EGFR - furosemide (LASIX) 40 MG tablet; Take 1 tablet (40 mg total) by mouth daily.  Dispense: 90 tablet; Refill: 1 - metoprolol succinate (TOPROL-XL) 25 MG 24 hr tablet; Take 1 tablet (25 mg total) by mouth daily.  Dispense:  30 tablet; Refill: 2  4. Morbid obesity with BMI of 50.0-59.9, adult (HCC) Continue healthy eating, exercise, and Trulicity.  5-6. Bilateral primary osteoarthritis of knee/Controlled substance agreement signed Well controlled on current regimen. Controlled substance agreement in place. Urine drug screen as expected. PDMP reviewed with no concerning findings.  - CMP14+EGFR - Oxycodone HCl 10 MG TABS; Take 1 tablet (10 mg total) by mouth 2 (two) times daily as needed.  Dispense: 60 tablet; Refill: 0 - Oxycodone HCl 10 MG TABS; Take 1 tablet (10 mg total) by mouth 2 (two) times daily as needed.  Dispense: 60 tablet; Refill: 0 - Oxycodone HCl 10 MG TABS; Take 1 tablet (10 mg total) by mouth 2 (two) times daily as needed.  Dispense: 60 tablet; Refill: 0  7. Chronic migraine without aura without status migrainosus, not intractable Managed by neurology. - CMP14+EGFR - amitriptyline (ELAVIL) 50 MG tablet; Take 1 tablet (50 mg total) by mouth daily.  Dispense: 90 tablet; Refill: 1  8. Seasonal allergies Well controlled on current regimen.  - montelukast (SINGULAIR) 10 MG tablet; TAKE 1 TABLET BY MOUTH EVERYDAY AT BEDTIME  Dispense: 90 tablet; Refill: 1  9. Primary insomnia Uncontrolled. D/C Sonata. Start Trazodone. - traZODone (DESYREL) 50 MG tablet; Take 1-2 tablets (50-100 mg total) by mouth at bedtime as needed for sleep.  Dispense: 30 tablet; Refill: 3  10. Obstructive sleep apnea on CPAP Waiting on correct equipment.  11. Cervical pain - cyclobenzaprine (FLEXERIL) 10 MG tablet; TAKE 1 TABLET BY MOUTH THREE TIMES A DAY AS NEEDED FOR MUSCLE  SPASMS  Dispense: 60 tablet; Refill: 2  12. Need for hepatitis C screening test - Hepatitis C antibody   Return in about 6 weeks (around 02/15/2021) for DM, HTN, sleep.  Hendricks Limes, MSN, APRN, FNP-C [facility]  Subjective:    Patient ID: Vanessa [name], female    DOB: 1975/12/27, 45 y.o.   MRN: [id number]  Patient  Care Team: Loman Brooklyn, FNP as PCP - General (Family Medicine)   Chief Complaint:  Chief Complaint  Patient presents with   Diabetes   Hypertension    3 month follow up of chronic medical conditions     HPI: Vanessa [name] is a 45 y.o. female presenting on 01/04/2021 for Diabetes and Hypertension (3 month follow up of chronic medical conditions )  Pain assessment: Cause of pain- osteoarthritis Pain location- ankles, knees, and sometimes her back Pain on scale of 1-10- 7-8/10 without medication, 1-2/10 with medication Frequency- daily What increases pain- walking too much and heavy lifting What makes pain better- medication, bring her legs up, and ice Effects on ADL- sometimes she is unable to bend the knee and is unable to shower at that time.  She has to wait till her pain medicine kicks in before she can take a shower. Any change in general medical condition- none  Current opioids rx- Oxycodone 10 mg BID PRN # meds rx- 60 Effectiveness of current meds- effective Adverse reactions from pain meds- none Morphine equivalent- 30 MME/day  Pill count performed-No Last drug screen - 07/06/2019 ( high risk q47m, moderate risk q35m, low risk yearly ) Urine drug screen today- Yes Was the [place] reviewed- Yes  If yes were their any concerning findings? - No  Overdose risk: 210  Opioid Risk  04/06/2020  Alcohol 0  Illegal Drugs 0  Rx Drugs 0  Alcohol 0  Illegal Drugs 0  Rx Drugs 0  Age between 16-45 years  1  History of Preadolescent Sexual Abuse 0  Psychological Disease 0  Depression 0  Opioid Risk Tool Scoring 1  Opioid Risk Interpretation Low Risk   Pain contract signed on: 07/04/2020   Diabetes with hypertension and hyperlipidemia: Patient presents for follow up of diabetes. Current symptoms include: none. Known diabetic complications: cardiovascular disease. Medication compliance: yes. States she has been using Humulin per a sliding scale provided to her in the  hospital where she received steroids. Current diet: in general, a "healthy" diet  , hardly no bread, little sweets and pasta, soda only every once in a while . Current exercise: walking and weightlifting. Home blood sugar records: BGs are running  consistent with Hgb A1C, which is very high. Patient reports readings 230-508. She was recently in the hospital where she was given steroids . Is she  on ACE inhibitor or angiotensin II receptor blocker? Yes (Lisinopril). Is she on a statin? Yes (Atorvastatin).   Hypertension: patient does occasionally check her blood pressure outside of the office and reports readings 130-140/70-80s. She is walking and lifting light (2-3) lbs weights.   Insomnia: patient reports she falls asleep with Sonata, but is not staying asleep. This is the only prescription medication she has ever had for insomnia.   Sleep Apnea: patient reports they sent her the wrong equipment and she is waiting on the correct equipment. She is therefore not currently being treated for sleep apnea.   New complaints: None   Social history:  Relevant past medical, surgical, family and social history reviewed and updated as indicated. Interim  medical history since our last visit reviewed.  Allergies and medications reviewed and updated.  DATA REVIEWED: CHART IN EPIC  ROS: Negative unless specifically indicated above in HPI.    Current Outpatient Medications:    amitriptyline (ELAVIL) 50 MG tablet, Take 1 tablet (50 mg total) by mouth daily., Disp: 90 tablet, Rfl: 1   atorvastatin (LIPITOR) 80 MG tablet, Take 1 tablet (80 mg total) by mouth daily., Disp: 90 tablet, Rfl: 1   cyclobenzaprine (FLEXERIL) 10 MG tablet, TAKE 1 TABLET BY MOUTH THREE TIMES A DAY AS NEEDED FOR MUSCLE SPASMS, Disp: 60 tablet, Rfl: 2   Dulaglutide (TRULICITY) 4.5 XK/4.8JE SOPN, Inject 4.5 mg as directed once a week., Disp: 2 mL, Rfl: 5   flunisolide (NASALIDE) 25 MCG/ACT (0.025%) SOLN, Instill 2 sprays into each  nostril twice daily., Disp: 25 mL, Rfl: 2   FLUoxetine (PROZAC) 20 MG capsule, Take 1 capsule by mouth every evening., Disp: 30 capsule, Rfl: 3   Fremanezumab-vfrm (AJOVY) 225 MG/1.5ML SOAJ, Inject 225 mg into the skin every 30 (thirty) days., Disp: 4.5 mL, Rfl: 3   furosemide (LASIX) 40 MG tablet, Take 1 tablet (40 mg total) by mouth daily., Disp: 90 tablet, Rfl: 3   glucose blood (ONE TOUCH ULTRA TEST) test strip, Test sugar daily Dx E11.9, Disp: 100 each, Rfl: 3   halobetasol (ULTRAVATE) 0.05 % cream, Apply topically 2 (two) times daily., Disp: 50 g, Rfl: 5   lisinopril (ZESTRIL) 40 MG tablet, Take 1 tablet by mouth daily., Disp: 90 tablet, Rfl: 0   loratadine (CLARITIN) 10 MG tablet, Take 1 tablet by mouth daily., Disp: 90 tablet, Rfl: 1   megestrol (MEGACE) 40 MG tablet, 3 tablets a day for 5 days, 2 tablets a day for 5 days then 1 tablet daily, Disp: 45 tablet, Rfl: 3   montelukast (SINGULAIR) 10 MG tablet, TAKE 1 TABLET BY MOUTH EVERYDAY AT BEDTIME, Disp: 90 tablet, Rfl: 0   Multiple Vitamin (MULTIVITAMIN) tablet, Take 1 tablet by mouth daily., Disp: , Rfl:    naproxen (NAPROSYN) 500 MG tablet, Take 500 mg by mouth 2 (two) times daily., Disp: , Rfl:    neomycin-polymyxin-hydrocortisone (CORTISPORIN) OTIC solution, Place 3 drops into both ears 4 (four) times daily., Disp: 10 mL, Rfl: 0   OneTouch Delica Lancets 56D MISC, 1 each by Does not apply route daily. Dx E11.9, Disp: 100 each, Rfl: 3   Oxycodone HCl 10 MG TABS, Take 1 tablet (10 mg total) by mouth 2 (two) times daily as needed., Disp: 60 tablet, Rfl: 0   Oxycodone HCl 10 MG TABS, Take 1 tablet (10 mg total) by mouth 2 (two) times daily as needed., Disp: 60 tablet, Rfl: 0   Rimegepant Sulfate (NURTEC) 75 MG TBDP, Take 75 mg by mouth daily as needed. Onset migraine (max one daily), Disp: 10 tablet, Rfl: 5   spironolactone (ALDACTONE) 100 MG tablet, Take 1 tablet (100 mg total) by mouth 2 (two) times daily., Disp: 180 tablet, Rfl: 0    TART CHERRY PO, Take by mouth., Disp: , Rfl:    zaleplon (SONATA) 10 MG capsule, Take 1 capsule (10 mg total) by mouth at bedtime as needed for sleep., Disp: 30 capsule, Rfl: 5   Allergies  Allergen Reactions   Metrizamide Other (See Comments), Itching, Photosensitivity, Shortness Of Breath and Swelling    update Migraine instantly   Mushroom Extract Complex Itching    Throat itching with cough   Peanut Oil Anaphylaxis and Other (See Comments)  update   Peanut-Containing Drug Products Itching    Itching throat with cough   Diflucan In Dextrose [Fluconazole In Dextrose] Hives   Iodinated Diagnostic Agents    Iodine-131 Other (See Comments)    update   Past Medical History:  Diagnosis Date   Hypertension    Migraines    Pseudotumor (inflammatory) of orbit    Sepsis ([place]) 02/2019   Vaginal Pap smear, abnormal    Vision loss     Past Surgical History:  Procedure Laterality Date   CSF SHUNT      Social History   Socioeconomic History   Marital status: Single    Spouse name: Not on file   Number of children: Not on file   Years of education: Not on file   Highest education level: Not on file  Occupational History   Not on file  Tobacco Use   Smoking status: Never   Smokeless tobacco: Never  Vaping Use   Vaping Use: Never used  Substance and Sexual Activity   Alcohol use: No   Drug use: No   Sexual activity: Not Currently    Birth control/protection: Condom  Other Topics Concern   Not on file  Social History Narrative   Lives with parents   Right handed   Caffeine: maybe 2 cups/day   Social Determinants of Health   Financial Resource Strain: Low Risk    Difficulty of Paying Living Expenses: Not very hard  Food Insecurity: Food Insecurity Present   Worried About Running Out of Food in the Last Year: Sometimes true   Ran Out of Food in the Last Year: Sometimes true  Transportation Needs: No Transportation Needs   Lack of Transportation (Medical): No   Lack  of Transportation (Non-Medical): No  Physical Activity: Insufficiently Active   Days of Exercise per Week: 5 days   Minutes of Exercise per Session: 10 min  Stress: No Stress Concern Present   Feeling of Stress : Only a little  Social Connections: Moderately Isolated   Frequency of Communication with Friends and Family: More than three times a week   Frequency of Social Gatherings with Friends and Family: More than three times a week   Attends Religious Services: 1 to 4 times per year   Active Member of [employer] or Organizations: No   Attends Archivist Meetings: Never   Marital Status: Never married  Human resources officer Violence: Not At Risk   Fear of Current or Ex-Partner: No   Emotionally Abused: No   Physically Abused: No   Sexually Abused: No        Objective:    BP 139/81   Pulse (!) 107   Temp 97.7 F (36.5 C) (Temporal)   Ht $R'5\' 10"'Wi$  (1.778 m)   Wt (!) 339 lb 6.4 oz (154 kg)   SpO2 98%   BMI 48.70 kg/m   Wt Readings from Last 3 Encounters:  01/04/21 (!) 339 lb 6.4 oz (154 kg)  12/25/20 (!) 337 lb (152.9 kg)  11/30/20 (!) 351 lb (159.2 kg)    Physical Exam Vitals reviewed.  Constitutional:      General: She is not in acute distress.    Appearance: Normal appearance. She is morbidly obese. She is not ill-appearing, toxic-appearing or diaphoretic.  HENT:     Head: Normocephalic and atraumatic.     Right Ear: Tympanic membrane, ear canal and external ear normal. There is no impacted cerumen.     Left Ear: Tympanic membrane, ear  canal and external ear normal. There is no impacted cerumen.     Mouth/Throat:     Mouth: Mucous membranes are moist.     Pharynx: Oropharynx is clear.  Eyes:     General: No scleral icterus.       Right eye: No discharge.        Left eye: No discharge.     Conjunctiva/sclera: Conjunctivae normal.  Cardiovascular:     Rate and Rhythm: Normal rate and regular rhythm.     Heart sounds: Normal heart sounds. No murmur heard.   No  friction rub. No gallop.  Pulmonary:     Effort: Pulmonary effort is normal. No respiratory distress.     Breath sounds: Normal breath sounds. No stridor. No wheezing, rhonchi or rales.  Musculoskeletal:        General: Normal range of motion.     Cervical back: Normal range of motion.  Lymphadenopathy:     Cervical: No cervical adenopathy.  Skin:    General: Skin is warm and dry.     Capillary Refill: Capillary refill takes less than 2 seconds.  Neurological:     General: No focal deficit present.     Mental Status: She is alert and oriented to person, place, and time. Mental status is at baseline.  Psychiatric:        Mood and Affect: Mood normal.        Behavior: Behavior normal.        Thought Content: Thought content normal.        Judgment: Judgment normal.   No results found for: TSH Lab Results  Component Value Date   WBC 7.6 10/04/2020   HGB 11.6 10/04/2020   HCT 36.7 10/04/2020   MCV 79 10/04/2020   PLT 197 10/04/2020   Lab Results  Component Value Date   NA 137 10/04/2020   K 4.2 10/04/2020   CO2 21 10/04/2020   GLUCOSE 115 (H) 10/04/2020   BUN 10 10/04/2020   CREATININE 0.85 10/04/2020   BILITOT 0.3 10/04/2020   ALKPHOS 56 10/04/2020   AST 22 10/04/2020   ALT 20 10/04/2020   PROT 7.3 10/04/2020   ALBUMIN 3.9 10/04/2020   CALCIUM 8.9 10/04/2020   EGFR 87 10/04/2020   Lab Results  Component Value Date   CHOL 143 10/04/2020   Lab Results  Component Value Date   HDL 33 (L) 10/04/2020   Lab Results  Component Value Date   LDLCALC 87 10/04/2020   Lab Results  Component Value Date   TRIG 130 10/04/2020   Lab Results  Component Value Date   CHOLHDL 4.3 10/04/2020   Lab Results  Component Value Date   HGBA1C 6.5 10/04/2020

## 2021-01-04 NOTE — Patient Instructions (Addendum)
Monitor your blood pressure at home, keep a log, and bring it back with you to your next appointment.  Stop Sonata, start Trazodone for sleep.

## 2021-01-05 LAB — CBC WITH DIFFERENTIAL/PLATELET
Basophils Absolute: 0 10*3/uL (ref 0.0–0.2)
Basos: 0 %
EOS (ABSOLUTE): 0.2 10*3/uL (ref 0.0–0.4)
Eos: 2 %
Hematocrit: 31.1 % — ABNORMAL LOW (ref 34.0–46.6)
Hemoglobin: 9.9 g/dL — ABNORMAL LOW (ref 11.1–15.9)
Immature Grans (Abs): 0 10*3/uL (ref 0.0–0.1)
Immature Granulocytes: 0 %
Lymphocytes Absolute: 2.4 10*3/uL (ref 0.7–3.1)
Lymphs: 34 %
MCH: 24.5 pg — ABNORMAL LOW (ref 26.6–33.0)
MCHC: 31.8 g/dL (ref 31.5–35.7)
MCV: 77 fL — ABNORMAL LOW (ref 79–97)
Monocytes Absolute: 0.5 10*3/uL (ref 0.1–0.9)
Monocytes: 7 %
Neutrophils Absolute: 4 10*3/uL (ref 1.4–7.0)
Neutrophils: 57 %
Platelets: 211 10*3/uL (ref 150–450)
RBC: 4.04 x10E6/uL (ref 3.77–5.28)
RDW: 15 % (ref 11.7–15.4)
WBC: 7.1 10*3/uL (ref 3.4–10.8)

## 2021-01-05 LAB — CMP14+EGFR
ALT: 20 IU/L (ref 0–32)
AST: 14 IU/L (ref 0–40)
Albumin/Globulin Ratio: 1.4 (ref 1.2–2.2)
Albumin: 4.1 g/dL (ref 3.8–4.8)
Alkaline Phosphatase: 66 IU/L (ref 44–121)
BUN/Creatinine Ratio: 12 (ref 9–23)
BUN: 13 mg/dL (ref 6–24)
Bilirubin Total: 0.3 mg/dL (ref 0.0–1.2)
CO2: 19 mmol/L — ABNORMAL LOW (ref 20–29)
Calcium: 9.1 mg/dL (ref 8.7–10.2)
Chloride: 100 mmol/L (ref 96–106)
Creatinine, Ser: 1.07 mg/dL — ABNORMAL HIGH (ref 0.57–1.00)
Globulin, Total: 2.9 g/dL (ref 1.5–4.5)
Glucose: 282 mg/dL — ABNORMAL HIGH (ref 70–99)
Potassium: 4.4 mmol/L (ref 3.5–5.2)
Sodium: 131 mmol/L — ABNORMAL LOW (ref 134–144)
Total Protein: 7 g/dL (ref 6.0–8.5)
eGFR: 66 mL/min/{1.73_m2} (ref >59)

## 2021-01-05 LAB — LIPID PANEL
Chol/HDL Ratio: 5.9 ratio — ABNORMAL HIGH (ref 0.0–4.4)
Cholesterol, Total: 177 mg/dL (ref 100–199)
HDL: 30 mg/dL — ABNORMAL LOW (ref >39)
LDL Chol Calc (NIH): 126 mg/dL — ABNORMAL HIGH (ref 0–99)
Triglycerides: 114 mg/dL (ref 0–149)
VLDL Cholesterol Cal: 21 mg/dL (ref 5–40)

## 2021-01-05 LAB — HEPATITIS C ANTIBODY: Hep C Virus Ab: 0.2 s/co ratio (ref 0.0–0.9)

## 2021-01-11 ENCOUNTER — Other Ambulatory Visit: Payer: Self-pay | Admitting: Family Medicine

## 2021-01-11 DIAGNOSIS — F5101 Primary insomnia: Secondary | ICD-10-CM

## 2021-01-11 DIAGNOSIS — R710 Precipitous drop in hematocrit: Secondary | ICD-10-CM

## 2021-01-15 ENCOUNTER — Other Ambulatory Visit: Payer: Self-pay | Admitting: Family Medicine

## 2021-01-24 ENCOUNTER — Ambulatory Visit (INDEPENDENT_AMBULATORY_CARE_PROVIDER_SITE_OTHER): Payer: 59

## 2021-01-24 VITALS — Ht 70.0 in | Wt 339.0 lb

## 2021-01-24 DIAGNOSIS — Z0000 Encounter for general adult medical examination without abnormal findings: Secondary | ICD-10-CM | POA: Diagnosis not present

## 2021-01-24 NOTE — Patient Instructions (Signed)
Vanessa [name] , Thank you for taking time to come for your Medicare Wellness Visit. I appreciate your ongoing commitment to your health goals. Please review the following plan we discussed and let me know if I can assist you in the future.   Screening recommendations/referrals: Colonoscopy: Due at age 45 Mammogram: Done 07/2020 - Repeat annually  Bone Density: Due at age 61 Recommended yearly ophthalmology/optometry visit for glaucoma screening and checkup Recommended yearly dental visit for hygiene and checkup  Vaccinations: Influenza vaccine: Done 11/25/2020 - Repeat annually Pneumococcal vaccine: Done 04/18/2016 - ask about Prevnar Tdap vaccine: Done 2016 - Repeat in 10 years Shingles vaccine: Due at age 39  Covid-19: Done 05/21/2019, 06/25/2019, 02/14/2020, & 12/27/2020  Advanced directives: Advance directive discussed with you today. Even though you declined this today, please call our office should you change your mind, and we can give you the proper paperwork for you to fill out.   Conditions/risks identified: Aim for 30 minutes of exercise or brisk walking each day, drink 6-8 glasses of water and eat lots of fruits and vegetables.   Next appointment: Follow up in one year for your annual wellness visit.   Preventive Care 40-64 Years, Female Preventive care refers to lifestyle choices and visits with your health care provider that can promote health and wellness. What does preventive care include? A yearly physical exam. This is also called an annual well check. Dental exams once or twice a year. Routine eye exams. Ask your health care provider how often you should have your eyes checked. Personal lifestyle choices, including: Daily care of your teeth and gums. Regular physical activity. Eating a healthy diet. Avoiding tobacco and drug use. Limiting alcohol use. Practicing safe sex. Taking low-dose aspirin daily starting at age 53. Taking vitamin and mineral supplements as  recommended by your health care provider. What happens during an annual well check? The services and screenings done by your health care provider during your annual well check will depend on your age, overall health, lifestyle risk factors, and family history of disease. Counseling  Your health care provider may ask you questions about your: Alcohol use. Tobacco use. Drug use. Emotional well-being. Home and relationship well-being. Sexual activity. Eating habits. Work and work Statistician. Method of birth control. Menstrual cycle. Pregnancy history. Screening  You may have the following tests or measurements: Height, weight, and BMI. Blood pressure. Lipid and cholesterol levels. These may be checked every 5 years, or more frequently if you are over 11 years old. Skin check. Lung cancer screening. You may have this screening every year starting at age 56 if you have a 30-pack-year history of smoking and currently smoke or have quit within the past 15 years. Fecal occult blood test (FOBT) of the stool. You may have this test every year starting at age 36. Flexible sigmoidoscopy or colonoscopy. You may have a sigmoidoscopy every 5 years or a colonoscopy every 10 years starting at age 28. Hepatitis C blood test. Hepatitis B blood test. Sexually transmitted disease (STD) testing. Diabetes screening. This is done by checking your blood sugar (glucose) after you have not eaten for a while (fasting). You may have this done every 1-3 years. Mammogram. This may be done every 1-2 years. Talk to your health care provider about when you should start having regular mammograms. This may depend on whether you have a family history of breast cancer. BRCA-related cancer screening. This may be done if you have a family history of breast, ovarian, tubal, or  peritoneal cancers. Pelvic exam and Pap test. This may be done every 3 years starting at age 34. Starting at age 75, this may be done every 5 years if  you have a Pap test in combination with an HPV test. Bone density scan. This is done to screen for osteoporosis. You may have this scan if you are at high risk for osteoporosis. Discuss your test results, treatment options, and if necessary, the need for more tests with your health care provider. Vaccines  Your health care provider may recommend certain vaccines, such as: Influenza vaccine. This is recommended every year. Tetanus, diphtheria, and acellular pertussis (Tdap, Td) vaccine. You may need a Td booster every 10 years. Zoster vaccine. You may need this after age 44. Pneumococcal 13-valent conjugate (PCV13) vaccine. You may need this if you have certain conditions and were not previously vaccinated. Pneumococcal polysaccharide (PPSV23) vaccine. You may need one or two doses if you smoke cigarettes or if you have certain conditions. Talk to your health care provider about which screenings and vaccines you need and how often you need them. This information is not intended to replace advice given to you by your health care provider. Make sure you discuss any questions you have with your health care provider. Document Released: 03/10/2015 Document Revised: 11/01/2015 Document Reviewed: 12/13/2014 Elsevier Interactive Patient Education  2017 [place] Prevention in the Home Falls can cause injuries. They can happen to people of all ages. There are many things you can do to make your home safe and to help prevent falls. What can I do on the outside of my home? Regularly fix the edges of walkways and driveways and fix any cracks. Remove anything that might make you trip as you walk through a door, such as a raised step or threshold. Trim any bushes or trees on the path to your home. Use bright outdoor lighting. Clear any walking paths of anything that might make someone trip, such as rocks or tools. Regularly check to see if handrails are loose or broken. Make sure that both  sides of any steps have handrails. Any raised decks and porches should have guardrails on the edges. Have any leaves, snow, or ice cleared regularly. Use sand or salt on walking paths during winter. Clean up any spills in your garage right away. This includes oil or grease spills. What can I do in the bathroom? Use night lights. Install grab bars by the toilet and in the tub and shower. Do not use towel bars as grab bars. Use non-skid mats or decals in the tub or shower. If you need to sit down in the shower, use a plastic, non-slip stool. Keep the floor dry. Clean up any water that spills on the floor as soon as it happens. Remove soap buildup in the tub or shower regularly. Attach bath mats securely with double-sided non-slip rug tape. Do not have throw rugs and other things on the floor that can make you trip. What can I do in the bedroom? Use night lights. Make sure that you have a light by your bed that is easy to reach. Do not use any sheets or blankets that are too big for your bed. They should not hang down onto the floor. Have a firm chair that has side arms. You can use this for support while you get dressed. Do not have throw rugs and other things on the floor that can make you trip. What can I do in  the kitchen? Clean up any spills right away. Avoid walking on wet floors. Keep items that you use a lot in easy-to-reach places. If you need to reach something above you, use a strong step stool that has a grab bar. Keep electrical cords out of the way. Do not use floor polish or wax that makes floors slippery. If you must use wax, use non-skid floor wax. Do not have throw rugs and other things on the floor that can make you trip. What can I do with my stairs? Do not leave any items on the stairs. Make sure that there are handrails on both sides of the stairs and use them. Fix handrails that are broken or loose. Make sure that handrails are as long as the stairways. Check any  carpeting to make sure that it is firmly attached to the stairs. Fix any carpet that is loose or worn. Avoid having throw rugs at the top or bottom of the stairs. If you do have throw rugs, attach them to the floor with carpet tape. Make sure that you have a light switch at the top of the stairs and the bottom of the stairs. If you do not have them, ask someone to add them for you. What else can I do to help prevent falls? Wear shoes that: Do not have high heels. Have rubber bottoms. Are comfortable and fit you well. Are closed at the toe. Do not wear sandals. If you use a stepladder: Make sure that it is fully opened. Do not climb a closed stepladder. Make sure that both sides of the stepladder are locked into place. Ask someone to hold it for you, if possible. Clearly mark and make sure that you can see: Any grab bars or handrails. First and last steps. Where the edge of each step is. Use tools that help you move around (mobility aids) if they are needed. These include: Canes. Walkers. Scooters. Crutches. Turn on the lights when you go into a dark area. Replace any light bulbs as soon as they burn out. Set up your furniture so you have a clear path. Avoid moving your furniture around. If any of your floors are uneven, fix them. If there are any pets around you, be aware of where they are. Review your medicines with your doctor. Some medicines can make you feel dizzy. This can increase your chance of falling. Ask your doctor what other things that you can do to help prevent falls. This information is not intended to replace advice given to you by your health care provider. Make sure you discuss any questions you have with your health care provider. Document Released: 12/08/2008 Document Revised: 07/20/2015 Document Reviewed: 03/18/2014 Elsevier Interactive Patient Education  2017 [employer].

## 2021-01-24 NOTE — Progress Notes (Signed)
Subjective:   Vanessa [name] is a 45 y.o. female who presents for an Initial Medicare Annual Wellness Visit.  Virtual Visit via Telephone Note  I connected with  Vanessa [name] on 01/24/21 at 10:30 AM EST by telephone and verified that I am speaking with the correct person using two identifiers.  Location: Patient: Home Provider: [facility] Persons participating in the virtual visit: patient/Nurse Health Advisor   I discussed the limitations, risks, security and privacy concerns of performing an evaluation and management service by telephone and the availability of in person appointments. The patient expressed understanding and agreed to proceed.  Interactive audio and video telecommunications were attempted between this nurse and patient, however failed, due to patient having technical difficulties OR patient did not have access to video capability.  We continued and completed visit with audio only.  Some vital signs may be absent or patient reported.   Vanessa [name] E Vanessa Maqueda, LPN   Review of Systems     Cardiac Risk Factors include: advanced age (>70men, >79 women);diabetes mellitus;dyslipidemia;obesity (BMI >30kg/m2);sedentary lifestyle;hypertension     Objective:    Today's Vitals   01/24/21 1035  Weight: (!) 339 lb (153.8 kg)  Height: 5\' 10"  (1.778 m)   Body mass index is 48.64 kg/m.  Advanced Directives 01/24/2021  Does Patient Have a Medical Advance Directive? No  Would patient like information on creating a medical advance directive? No - Patient declined    Current Medications (verified) Outpatient Encounter Medications as of 01/24/2021  Medication Sig   amitriptyline (ELAVIL) 50 MG tablet Take 1 tablet (50 mg total) by mouth daily.   atorvastatin (LIPITOR) 80 MG tablet Take 1 tablet (80 mg total) by mouth daily.   cyclobenzaprine (FLEXERIL) 10 MG tablet TAKE 1 TABLET BY MOUTH THREE TIMES A DAY AS NEEDED FOR MUSCLE SPASMS   Dulaglutide (TRULICITY) 4.5 HD/6.2IW SOPN Inject  4.5 mg as directed once a week.   empagliflozin (JARDIANCE) 10 MG TABS tablet Take 1 tablet (10 mg total) by mouth daily before breakfast.   flunisolide (NASALIDE) 25 MCG/ACT (0.025%) SOLN Instill 2 sprays into each nostril twice daily.   FLUoxetine (PROZAC) 20 MG capsule Take 1 capsule by mouth every evening.   Fremanezumab-vfrm (AJOVY) 225 MG/1.5ML SOAJ Inject 225 mg into the skin every 30 (thirty) days.   furosemide (LASIX) 40 MG tablet Take 1 tablet (40 mg total) by mouth daily.   glucose blood (ONE TOUCH ULTRA TEST) test strip Test sugar daily Dx E11.9   halobetasol (ULTRAVATE) 0.05 % cream Apply topically 2 (two) times daily.   lisinopril (ZESTRIL) 40 MG tablet Take 1 tablet by mouth daily.   loratadine (CLARITIN) 10 MG tablet Take 1 tablet by mouth daily.   megestrol (MEGACE) 40 MG tablet 3 tablets a day for 5 days, 2 tablets a day for 5 days then 1 tablet daily   metoprolol succinate (TOPROL-XL) 25 MG 24 hr tablet Take 1 tablet (25 mg total) by mouth daily.   montelukast (SINGULAIR) 10 MG tablet TAKE 1 TABLET BY MOUTH EVERYDAY AT BEDTIME   Multiple Vitamin (MULTIVITAMIN) tablet Take 1 tablet by mouth daily.   naproxen (NAPROSYN) 500 MG tablet Take 500 mg by mouth 2 (two) times daily.   neomycin-polymyxin-hydrocortisone (CORTISPORIN) OTIC solution Place 3 drops into both ears 4 (four) times daily.   OneTouch Delica Lancets 97L MISC 1 each by Does not apply route daily. Dx E11.9   [START ON 02/03/2021] Oxycodone HCl 10 MG TABS Take 1 tablet (10 mg total)  by mouth 2 (two) times daily as needed.   [START ON 03/05/2021] Oxycodone HCl 10 MG TABS Take 1 tablet (10 mg total) by mouth 2 (two) times daily as needed.   Oxycodone HCl 10 MG TABS Take 1 tablet (10 mg total) by mouth 2 (two) times daily as needed.   Rimegepant Sulfate (NURTEC) 75 MG TBDP Take 75 mg by mouth daily as needed. Onset migraine (max one daily)   spironolactone (ALDACTONE) 100 MG tablet Take 1 tablet (100 mg total) by mouth 2  (two) times daily.   TART CHERRY PO Take by mouth.   traZODone (DESYREL) 50 MG tablet Take 1-2 tablets (50-100 mg total) by mouth at bedtime as needed for sleep.   No facility-administered encounter medications on file as of 01/24/2021.    Allergies (verified) Metrizamide, Mushroom extract complex, Peanut oil, Peanut-containing drug products, Diflucan in dextrose [fluconazole in dextrose], Iodinated diagnostic agents, and Iodine-131   History: Past Medical History:  Diagnosis Date   Allergy    Hypertension    Migraines    Pseudotumor (inflammatory) of orbit    Sepsis ([place]) 02/2019   Sleep apnea    Vaginal Pap smear, abnormal    Vision loss    Past Surgical History:  Procedure Laterality Date   BRAIN SURGERY     CSF SHUNT     Family History  Problem Relation Age of Onset   Pulmonary Hypertension Mother    Congestive Heart Failure Mother    Hypertension Sister    Obesity Sister    Migraines Neg Hx    Social History   Socioeconomic History   Marital status: Single    Spouse name: Not on file   Number of children: 0   Years of education: Not on file   Highest education level: Not on file  Occupational History   Occupation: disability  Tobacco Use   Smoking status: Never   Smokeless tobacco: Never  Vaping Use   Vaping Use: Never used  Substance and Sexual Activity   Alcohol use: No   Drug use: No   Sexual activity: Not Currently    Birth control/protection: Condom  Other Topics Concern   Not on file  Social History Narrative   Lives with parents   Right handed   Caffeine: maybe 2 cups/day   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   Social Determinants of Health   Financial Resource Strain: Low Risk    Difficulty of Paying Living Expenses: Not very hard  Food Insecurity: Food Insecurity Present   Worried About Running Out of Food in the Last Year: Sometimes true   Ran Out of Food in the Last Year: Sometimes true  Transportation Needs: No Transportation Needs    Lack of Transportation (Medical): No   Lack of Transportation (Non-Medical): No  Physical Activity: Insufficiently Active   Days of Exercise per Week: 7 days   Minutes of Exercise per Session: 20 min  Stress: No Stress Concern Present   Feeling of Stress : Only a little  Social Connections: Moderately Isolated   Frequency of Communication with Friends and Family: More than three times a week   Frequency of Social Gatherings with Friends and Family: More than three times a week   Attends Religious Services: 1 to 4 times per year   Active Member of [employer] or Organizations: No   Attends Archivist Meetings: Never   Marital Status: Never married    Tobacco Counseling Counseling given: Not Answered   Clinical Intake:  Pre-visit preparation completed: Yes  Pain : No/denies pain     BMI - recorded: 48.64 Nutritional Status: BMI > 30  Obese Nutritional Risks: None Diabetes: Yes CBG done?: No Did pt. bring in CBG monitor from home?: No  How often do you need to have someone help you when you read instructions, pamphlets, or other written materials from your doctor or pharmacy?: 1 - Never  Diabetic? Nutrition Risk Assessment:  Has the patient had any N/V/D within the last 2 months?  No  Does the patient have any non-healing wounds?  No  Has the patient had any unintentional weight loss or weight gain?  No   Diabetes:  Is the patient diabetic?  Yes  If diabetic, was a CBG obtained today?  No  Did the patient bring in their glucometer from home?  No  How often do you monitor your CBG's? 3-4 times per day.   Financial Strains and Diabetes Management:  Are you having any financial strains with the device, your supplies or your medication? No .  Does the patient want to be seen by Chronic Care Management for management of their diabetes?  No  Would the patient like to be referred to a Nutritionist or for Diabetic Management?  No   Diabetic Exams:  Diabetic Eye  Exam: Completed 02/08/2020.   Diabetic Foot Exam: Completed 04/06/2020. Pt has been advised about the importance in completing this exam. Pt is scheduled for diabetic foot exam on 02/14/2021    Interpreter Needed?: No  Information entered by :: Clayten Allcock, LPN   Activities of Daily Living In your present state of health, do you have any difficulty performing the following activities: 01/24/2021 01/20/2021  Hearing? N N  Vision? Y Y  Difficulty concentrating or making decisions? N N  Walking or climbing stairs? N N  Dressing or bathing? N N  Doing errands, shopping? Tempie Donning  Preparing Food and eating ? N N  Using the Toilet? N N  In the past six months, have you accidently leaked urine? N N  Do you have problems with loss of bowel control? N N  Managing your Medications? Y N  Comment she forgets them sometimes - sets alarms on her phone -  Managing your Finances? N N  Housekeeping or managing your Housekeeping? N N  Some recent data might be hidden    Patient Care Team: Loman Brooklyn, FNP as PCP - General (Family Medicine)  Indicate any recent Medical Services you may have received from other than [facility] providers in the past year (date may be approximate).     Assessment:   This is a routine wellness examination for Lucky.  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  Vision Screening - Comments:: Wears rx glasses - still has poor vision - up to date with annual eye exams with [facility] in [place] issues and exercise activities discussed: Current Exercise Habits: Home exercise routine, Type of exercise: walking;stretching, Time (Minutes): 20, Frequency (Times/Week): 7, Weekly Exercise (Minutes/Week): 140, Intensity: Mild, Exercise limited by: orthopedic condition(s)   Goals Addressed             This Visit's Progress    Achieve a Healthy Weight-Obesity       Timeframe:   Long-Range Goal Priority:  High Start Date:  Expected End Date:                       Follow Up Date 01/25/2022    - drink 6 to 8 glasses of water each day - eat 3 to 5 servings of fruits and vegetables each day - eat 5 or 6 small meals each day - join a weight loss program - manage portion size - set goal weight         Depression Screen PHQ 2/9 Scores 01/24/2021 01/04/2021 12/25/2020 11/14/2020 10/04/2020 07/04/2020 04/06/2020  PHQ - 2 Score 0 0 0 2 6 0 0  PHQ- 9 Score 4 3 3 8 22 6  -    Fall Risk Fall Risk  01/24/2021 01/20/2021 01/04/2021 11/14/2020 07/04/2020  Falls in the past year? 0 0 0 0 0  Number falls in past yr: 0 0 - - -  Injury with Fall? 0 0 - - -  Risk for fall due to : Orthopedic patient;Medication side effect;Impaired vision - - - -  Follow up Falls prevention discussed - - - -    FALL RISK PREVENTION PERTAINING TO THE HOME:  Any stairs in or around the home? Yes  If so, are there any without handrails? No  Home free of loose throw rugs in walkways, pet beds, electrical cords, etc? Yes  Adequate lighting in your home to reduce risk of falls? Yes   ASSISTIVE DEVICES UTILIZED TO PREVENT FALLS:  Life alert? No  Use of a cane, walker or w/c? No  Grab bars in the bathroom? No  Shower chair or bench in shower? No  Elevated toilet seat or a handicapped toilet? No   TIMED UP AND GO:  Was the test performed? No . Telephonic visit  Cognitive Function:     6CIT Screen 01/24/2021  What Year? 0 points  What month? 0 points  What time? 0 points  Count back from 20 0 points  Months in reverse 0 points  Repeat phrase 2 points  Total Score 2    Immunizations Immunization History  Administered Date(s) Administered   Influenza,inj,Quad PF,6+ Mos 04/28/2017   Influenza,inj,quad, With Preservative 02/14/2016   Influenza-Unspecified 02/14/2016, 02/14/2016, 11/04/2019, 11/25/2020   Moderna Sars-Covid-2 Vaccination 05/21/2019,  06/25/2019, 02/14/2020, 12/27/2020   Pneumococcal Polysaccharide-23 04/18/2016   Tdap 02/25/2014    TDAP status: Up to date  Flu Vaccine status: Up to date  Pneumococcal vaccine status: Up to date  Covid-19 vaccine status: Completed vaccines  Qualifies for Shingles Vaccine? No   Zostavax completed No    Screening Tests Health Maintenance  Topic Date Due   OPHTHALMOLOGY EXAM  02/07/2021   COVID-19 Vaccine (5 - Booster for Moderna series) 02/21/2021   FOOT EXAM  04/06/2021   HEMOGLOBIN A1C  07/04/2021   MAMMOGRAM  08/04/2022   PAP SMEAR-Modifier  10/05/2023   TETANUS/TDAP  02/26/2024   INFLUENZA VACCINE  Completed   Hepatitis C Screening  Completed   HIV Screening  Completed   HPV VACCINES  Aged Out   Pneumococcal Vaccine 39-80 Years old  Discontinued    Health Maintenance  There are no preventive care reminders to display for this patient.   Colon cancer screening due at age 10  Mammogram status: Completed 07/2020. Repeat every year was abnormal - had further testing which revealed dense tissue only  Bone Density Scan due at age 73  Lung Cancer Screening: (Low Dose CT Chest recommended if Age 55-80 years, 30 pack-year currently smoking  OR have quit w/in 15years.) does not qualify  Additional Screening:  Hepatitis C Screening: does qualify; Completed 01/04/2021  Vision Screening: Recommended annual ophthalmology exams for early detection of glaucoma and other disorders of the eye. Is the patient up to date with their annual eye exam?  Yes  Who is the provider or what is the name of the office in which the patient attends annual eye exams? [facility] If pt is not established with a provider, would they like to be referred to a provider to establish care? No .   Dental Screening: Recommended annual dental exams for proper oral hygiene  Community Resource Referral / Chronic Care Management: CRR required this visit?  No   CCM required this visit?  No       Plan:     I have personally reviewed and noted the following in the patient's chart:   Medical and social history Use of alcohol, tobacco or illicit drugs  Current medications and supplements including opioid prescriptions. Patient is not currently taking opioid prescriptions. Functional ability and status Nutritional status Physical activity Advanced directives List of other physicians Hospitalizations, surgeries, and ER visits in previous 12 months Vitals Screenings to include cognitive, depression, and falls Referrals and appointments  In addition, I have reviewed and discussed with patient certain preventive protocols, quality metrics, and best practice recommendations. A written personalized care plan for preventive services as well as general preventive health recommendations were provided to patient.     Sandrea Hammond, LPN   [id number]   Nurse Notes: none

## 2021-01-29 ENCOUNTER — Other Ambulatory Visit: Payer: Self-pay | Admitting: Family Medicine

## 2021-02-14 ENCOUNTER — Ambulatory Visit (INDEPENDENT_AMBULATORY_CARE_PROVIDER_SITE_OTHER): Payer: 59 | Admitting: Family Medicine

## 2021-02-14 ENCOUNTER — Encounter: Payer: Self-pay | Admitting: Family Medicine

## 2021-02-14 VITALS — BP 128/70 | HR 96 | Temp 97.5°F | Ht 70.0 in | Wt 330.6 lb

## 2021-02-14 DIAGNOSIS — M170 Bilateral primary osteoarthritis of knee: Secondary | ICD-10-CM

## 2021-02-14 DIAGNOSIS — I10 Essential (primary) hypertension: Secondary | ICD-10-CM | POA: Diagnosis not present

## 2021-02-14 DIAGNOSIS — E1165 Type 2 diabetes mellitus with hyperglycemia: Secondary | ICD-10-CM | POA: Diagnosis not present

## 2021-02-14 DIAGNOSIS — G4733 Obstructive sleep apnea (adult) (pediatric): Secondary | ICD-10-CM | POA: Diagnosis not present

## 2021-02-14 DIAGNOSIS — F5101 Primary insomnia: Secondary | ICD-10-CM

## 2021-02-14 DIAGNOSIS — Z9989 Dependence on other enabling machines and devices: Secondary | ICD-10-CM

## 2021-02-14 DIAGNOSIS — Z79899 Other long term (current) drug therapy: Secondary | ICD-10-CM

## 2021-02-14 MED ORDER — OXYCODONE HCL 10 MG PO TABS: 10.0000 mg | ORAL_TABLET | Freq: Two times a day (BID) | ORAL | 0 refills | Status: DC | PRN

## 2021-02-14 MED ORDER — TRULICITY 4.5 MG/0.5ML ~~LOC~~ SOAJ: 4.5000 mg | SUBCUTANEOUS | 1 refills | Status: DC

## 2021-02-14 MED ORDER — EMPAGLIFLOZIN 25 MG PO TABS
25.0000 mg | ORAL_TABLET | Freq: Every day | ORAL | 2 refills | Status: DC
Start: 2021-02-14 — End: 2021-05-14

## 2021-02-14 NOTE — Progress Notes (Signed)
Assessment & Plan:  1. Essential hypertension - Well controlled on current regimen - encouraged continued healthy diet and exercise  2. Type 2 diabetes mellitus with hyperglycemia, without long-term current use of insulin (HCC) -  Lab Results  Component Value Date   HGBA1C 10.2 (H) 01/04/2021   HGBA1C 6.5 10/04/2020   HGBA1C 7.1 (H) 07/04/2020    - Diabetes is not at goal of A1c < 7. - Medications:  continue trulicity 4.5 mg; increase jardiance from 10 mg to 25 mg daily. Advised she can take two of the 10 mg tablets she has at home to use them up - Home glucose monitoring: she is checking 4 times per day - Patient is currently taking a statin. Patient is taking an ACE-inhibitor/ARB.  - Instruction/counseling given: discussed the need for weight loss and discussed diet  Diabetes Health Maintenance Due  Topic Date Due   OPHTHALMOLOGY EXAM  02/07/2021   FOOT EXAM  04/06/2021   HEMOGLOBIN A1C  07/04/2021    Lab Results  Component Value Date   LABMICR See below: 01/27/2020   LABMICR 19.5 07/06/2019   - empagliflozin (JARDIANCE) 25 MG TABS tablet; Take 1 tablet (25 mg total) by mouth daily before breakfast.  Dispense: 30 tablet; Refill: 2  3. Primary insomnia - continue Trazodone  4. Obstructive sleep apnea on CPAP - Ambulatory referral to Pulmonology   Return in about 3 months (around 05/15/2021) for follow-up of chronic medication conditions.  Lucile Crater, NP Student   I personally was present during the history, physical exam, and medical decision-making activities of this service and have verified that the service and findings are accurately documented in the nurse practitioner student's note.  Hendricks Limes, MSN, APRN, FNP-C [facility]  Subjective:    Patient ID: Vanessa [name], female    DOB: [birth year], 45 y.o.   MRN: [id number]  Patient Care Team: Loman Brooklyn, FNP as PCP - General (Family Medicine)   Chief Complaint:  Chief  Complaint  Patient presents with   Diabetes   Hypertension   Sleeping Problem    6 week follow up     HPI: Vanessa [name] is a 45 y.o. female presenting on 02/14/2021 for Diabetes, Hypertension, and Sleeping Problem (6 week follow up )   Diabetes with hypertension and hyperlipidemia: Patient presents for follow up of diabetes. Current symptoms include: hyperglycemia. Known diabetic complications: cardiovascular disease. Medication compliance: yes, Trulicity 4.5 mg and Jardiance 10 mg. Current diet: in general, a "healthy" diet  , hardly no bread, little sweets and pasta, soda only every once in a while . Current exercise: walking and weightlifting. Home blood sugar records: BGs are running  consistent with Hgb A1C, which is elevated . Is she  on ACE inhibitor or angiotensin II receptor blocker? Yes (Lisinopril). Is she on a statin? Yes (Atorvastatin).   Hypertension: patient does occasionally check her blood pressure outside of the office and reports readings 130-140/70-80s. She is walking and lifting light (2-3) lbs weights. She is taking lasix 40 mg daily, spironolactone 100 mg BID, lisinopril 40 mg daily, and metoprolol 25 mg daily. Her reading is at goal <130 today.  Insomnia: She was initiated on Trazodone at her last visit. She states it is helping her fall asleep but she is still having trouble staying asleep. She says this has been an ongoing problem for years due to working night shift.  Sleep Apnea: patient reports they sent her the wrong equipment and she is waiting  on the correct equipment. She is therefore not currently being treated for sleep apnea. She would like a referral to a new pulmonologist since her current one is not helping her get supplies and is in [place].   New complaints: None   Social history:  Relevant past medical, surgical, family and social history reviewed and updated as indicated. Interim medical history since our last visit reviewed.  Allergies and  medications reviewed and updated.  DATA REVIEWED: CHART IN EPIC  ROS: Negative unless specifically indicated above in HPI.    Current Outpatient Medications:    amitriptyline (ELAVIL) 50 MG tablet, Take 1 tablet (50 mg total) by mouth daily., Disp: 90 tablet, Rfl: 1   atorvastatin (LIPITOR) 80 MG tablet, Take 1 tablet (80 mg total) by mouth daily., Disp: 90 tablet, Rfl: 1   cyclobenzaprine (FLEXERIL) 10 MG tablet, TAKE 1 TABLET BY MOUTH THREE TIMES A DAY AS NEEDED FOR MUSCLE SPASMS, Disp: 60 tablet, Rfl: 2   flunisolide (NASALIDE) 25 MCG/ACT (0.025%) SOLN, Instill 2 sprays into each nostril twice daily., Disp: 25 mL, Rfl: 2   FLUoxetine (PROZAC) 20 MG capsule, Take 1 capsule by mouth every evening., Disp: 30 capsule, Rfl: 3   Fremanezumab-vfrm (AJOVY) 225 MG/1.5ML SOAJ, Inject 225 mg into the skin every 30 (thirty) days., Disp: 4.5 mL, Rfl: 3   furosemide (LASIX) 40 MG tablet, Take 1 tablet (40 mg total) by mouth daily., Disp: 90 tablet, Rfl: 1   glucose blood test strip, Use to test sugar daily. Dx E11.65, Disp: 100 each, Rfl: 2   halobetasol (ULTRAVATE) 0.05 % cream, Apply topically 2 (two) times daily., Disp: 50 g, Rfl: 5   lisinopril (ZESTRIL) 40 MG tablet, Take 1 tablet by mouth daily., Disp: 90 tablet, Rfl: 0   loratadine (CLARITIN) 10 MG tablet, Take 1 tablet by mouth daily., Disp: 90 tablet, Rfl: 1   megestrol (MEGACE) 40 MG tablet, 3 tablets a day for 5 days, 2 tablets a day for 5 days then 1 tablet daily, Disp: 45 tablet, Rfl: 3   metoprolol succinate (TOPROL-XL) 25 MG 24 hr tablet, Take 1 tablet (25 mg total) by mouth daily., Disp: 30 tablet, Rfl: 2   montelukast (SINGULAIR) 10 MG tablet, TAKE 1 TABLET BY MOUTH EVERYDAY AT BEDTIME, Disp: 90 tablet, Rfl: 1   Multiple Vitamin (MULTIVITAMIN) tablet, Take 1 tablet by mouth daily., Disp: , Rfl:    naproxen (NAPROSYN) 500 MG tablet, Take 500 mg by mouth 2 (two) times daily., Disp: , Rfl:    neomycin-polymyxin-hydrocortisone (CORTISPORIN)  OTIC solution, Place 3 drops into both ears 4 (four) times daily., Disp: 10 mL, Rfl: 0   OneTouch Delica Lancets 42V MISC, 1 each by Does not apply route daily. Dx E11.9, Disp: 100 each, Rfl: 3   [START ON 03/05/2021] Oxycodone HCl 10 MG TABS, Take 1 tablet (10 mg total) by mouth 2 (two) times daily as needed., Disp: 60 tablet, Rfl: 0   Rimegepant Sulfate (NURTEC) 75 MG TBDP, Take 75 mg by mouth daily as needed. Onset migraine (max one daily), Disp: 10 tablet, Rfl: 5   spironolactone (ALDACTONE) 100 MG tablet, Take 1 tablet (100 mg total) by mouth 2 (two) times daily., Disp: 180 tablet, Rfl: 0   TART CHERRY PO, Take by mouth., Disp: , Rfl:    traZODone (DESYREL) 50 MG tablet, Take 1-2 tablets (50-100 mg total) by mouth at bedtime as needed for sleep., Disp: 30 tablet, Rfl: 3   Dulaglutide (TRULICITY) 4.5 ZD/6.3OV SOPN, Inject  4.5 mg as directed once a week., Disp: 6 mL, Rfl: 1   empagliflozin (JARDIANCE) 25 MG TABS tablet, Take 1 tablet (25 mg total) by mouth daily before breakfast., Disp: 30 tablet, Rfl: 2   [START ON 05/01/2021] Oxycodone HCl 10 MG TABS, Take 1 tablet (10 mg total) by mouth 2 (two) times daily as needed., Disp: 60 tablet, Rfl: 0   [START ON 04/04/2021] Oxycodone HCl 10 MG TABS, Take 1 tablet (10 mg total) by mouth 2 (two) times daily as needed., Disp: 60 tablet, Rfl: 0   Allergies  Allergen Reactions   Metrizamide Other (See Comments), Itching, Photosensitivity, Shortness Of Breath and Swelling    update Migraine instantly   Mushroom Extract Complex Itching    Throat itching with cough   Peanut Oil Anaphylaxis and Other (See Comments)    update   Peanut-Containing Drug Products Itching    Itching throat with cough   Diflucan In Dextrose [Fluconazole In Dextrose] Hives   Iodinated Diagnostic Agents    Iodine-131 Other (See Comments)    update   Past Medical History:  Diagnosis Date   Allergy    Hypertension    Migraines    Pseudotumor (inflammatory) of orbit    Sepsis  ([facility]) 02/2019   Sleep apnea    Vaginal Pap smear, abnormal    Vision loss     Past Surgical History:  Procedure Laterality Date   BRAIN SURGERY     CSF SHUNT      Social History   Socioeconomic History   Marital status: Single    Spouse name: Not on file   Number of children: 0   Years of education: Not on file   Highest education level: Not on file  Occupational History   Occupation: disability  Tobacco Use   Smoking status: Never   Smokeless tobacco: Never  Vaping Use   Vaping Use: Never used  Substance and Sexual Activity   Alcohol use: No   Drug use: No   Sexual activity: Not Currently    Birth control/protection: Condom  Other Topics Concern   Not on file  Social History Narrative   Lives with parents   Right handed   Caffeine: maybe 2 cups/day   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   Social Determinants of Health   Financial Resource Strain: Low Risk    Difficulty of Paying Living Expenses: Not very hard  Food Insecurity: Food Insecurity Present   Worried About Charity fundraiser in the Last Year: Sometimes true   Ran Out of Food in the Last Year: Sometimes true  Transportation Needs: No Transportation Needs   Lack of Transportation (Medical): No   Lack of Transportation (Non-Medical): No  Physical Activity: Insufficiently Active   Days of Exercise per Week: 7 days   Minutes of Exercise per Session: 20 min  Stress: No Stress Concern Present   Feeling of Stress : Only a little  Social Connections: Moderately Isolated   Frequency of Communication with Friends and Family: More than three times a week   Frequency of Social Gatherings with Friends and Family: More than three times a week   Attends Religious Services: 1 to 4 times per year   Active Member of [employer] or Organizations: No   Attends Archivist Meetings: Never   Marital Status: Never married  Human resources officer Violence: Not At Risk   Fear of Current or Ex-Partner: No   Emotionally Abused: No    Physically Abused: No  Sexually Abused: No        Objective:    BP 128/70    Pulse 96    Temp (!) 97.5 F (36.4 C) (Temporal)    Ht $R'5\' 10"'cy$  (1.778 m)    Wt (!) 150 kg    SpO2 98%    BMI 47.44 kg/m   Wt Readings from Last 3 Encounters:  02/14/21 (!) 330 lb 9.6 oz (150 kg)  01/24/21 (!) 339 lb (153.8 kg)  01/04/21 (!) 339 lb 6.4 oz (154 kg)    Physical Exam Vitals reviewed.  Constitutional:      General: She is not in acute distress.    Appearance: Normal appearance. She is morbidly obese. She is not ill-appearing, toxic-appearing or diaphoretic.  HENT:     Head: Normocephalic and atraumatic.     Right Ear: Tympanic membrane, ear canal and external ear normal. There is no impacted cerumen.     Left Ear: Tympanic membrane, ear canal and external ear normal. There is no impacted cerumen.     Mouth/Throat:     Mouth: Mucous membranes are moist.     Pharynx: Oropharynx is clear.  Eyes:     General: No scleral icterus.       Right eye: No discharge.        Left eye: No discharge.     Conjunctiva/sclera: Conjunctivae normal.  Cardiovascular:     Rate and Rhythm: Normal rate and regular rhythm.     Heart sounds: Normal heart sounds. No murmur heard.   No friction rub. No gallop.  Pulmonary:     Effort: Pulmonary effort is normal. No respiratory distress.     Breath sounds: Normal breath sounds. No stridor. No wheezing, rhonchi or rales.  Musculoskeletal:        General: Normal range of motion.     Cervical back: Normal range of motion.  Lymphadenopathy:     Cervical: No cervical adenopathy.  Skin:    General: Skin is warm and dry.     Capillary Refill: Capillary refill takes less than 2 seconds.  Neurological:     General: No focal deficit present.     Mental Status: She is alert and oriented to person, place, and time. Mental status is at baseline.  Psychiatric:        Mood and Affect: Mood normal.        Behavior: Behavior normal.        Thought Content: Thought  content normal.        Judgment: Judgment normal.   No results found for: TSH Lab Results  Component Value Date   WBC 7.1 01/04/2021   HGB 9.9 (L) 01/04/2021   HCT 31.1 (L) 01/04/2021   MCV 77 (L) 01/04/2021   PLT 211 01/04/2021   Lab Results  Component Value Date   NA 131 (L) 01/04/2021   K 4.4 01/04/2021   CO2 19 (L) 01/04/2021   GLUCOSE 282 (H) 01/04/2021   BUN 13 01/04/2021   CREATININE 1.07 (H) 01/04/2021   BILITOT 0.3 01/04/2021   ALKPHOS 66 01/04/2021   AST 14 01/04/2021   ALT 20 01/04/2021   PROT 7.0 01/04/2021   ALBUMIN 4.1 01/04/2021   CALCIUM 9.1 01/04/2021   EGFR 66 01/04/2021   Lab Results  Component Value Date   CHOL 177 01/04/2021   Lab Results  Component Value Date   HDL 30 (L) 01/04/2021   Lab Results  Component Value Date   LDLCALC 126 (H) 01/04/2021  Lab Results  Component Value Date   TRIG 114 01/04/2021   Lab Results  Component Value Date   CHOLHDL 5.9 (H) 01/04/2021   Lab Results  Component Value Date   HGBA1C 10.2 (H) 01/04/2021

## 2021-02-23 ENCOUNTER — Other Ambulatory Visit: Payer: Self-pay | Admitting: Obstetrics & Gynecology

## 2021-02-23 ENCOUNTER — Other Ambulatory Visit: Payer: Self-pay | Admitting: *Deleted

## 2021-02-23 DIAGNOSIS — I10 Essential (primary) hypertension: Secondary | ICD-10-CM

## 2021-02-23 DIAGNOSIS — E119 Type 2 diabetes mellitus without complications: Secondary | ICD-10-CM

## 2021-02-23 MED ORDER — SPIRONOLACTONE 100 MG PO TABS: 100.0000 mg | ORAL_TABLET | Freq: Two times a day (BID) | ORAL | 0 refills | Status: DC

## 2021-02-23 MED ORDER — LISINOPRIL 40 MG PO TABS: 40.0000 mg | ORAL_TABLET | Freq: Every day | ORAL | 0 refills | Status: DC

## 2021-02-27 ENCOUNTER — Other Ambulatory Visit: Payer: Self-pay | Admitting: Family Medicine

## 2021-02-27 DIAGNOSIS — F5101 Primary insomnia: Secondary | ICD-10-CM

## 2021-03-01 ENCOUNTER — Encounter: Payer: Self-pay | Admitting: Obstetrics & Gynecology

## 2021-03-01 ENCOUNTER — Other Ambulatory Visit: Payer: Self-pay

## 2021-03-01 ENCOUNTER — Telehealth (INDEPENDENT_AMBULATORY_CARE_PROVIDER_SITE_OTHER): Payer: 59 | Admitting: Obstetrics & Gynecology

## 2021-03-01 VITALS — BP 130/75 | HR 90 | Ht 70.0 in | Wt 330.0 lb

## 2021-03-01 DIAGNOSIS — N938 Other specified abnormal uterine and vaginal bleeding: Secondary | ICD-10-CM | POA: Diagnosis not present

## 2021-03-14 ENCOUNTER — Ambulatory Visit: Payer: 59 | Admitting: Family Medicine

## 2021-03-25 ENCOUNTER — Other Ambulatory Visit: Payer: Self-pay | Admitting: Family Medicine

## 2021-03-25 DIAGNOSIS — J302 Other seasonal allergic rhinitis: Secondary | ICD-10-CM

## 2021-03-26 ENCOUNTER — Ambulatory Visit (INDEPENDENT_AMBULATORY_CARE_PROVIDER_SITE_OTHER): Payer: 59 | Admitting: Family Medicine

## 2021-03-26 DIAGNOSIS — G43809 Other migraine, not intractable, without status migrainosus: Secondary | ICD-10-CM

## 2021-03-26 DIAGNOSIS — G43709 Chronic migraine without aura, not intractable, without status migrainosus: Secondary | ICD-10-CM | POA: Diagnosis not present

## 2021-03-26 MED ORDER — NURTEC 75 MG PO TBDP: 75.0000 mg | ORAL_TABLET | Freq: Every day | ORAL | 5 refills | Status: DC | PRN

## 2021-03-26 NOTE — Progress Notes (Signed)
03/26/2021 ALL: Vanessa [name] returns for 2nd Botox procedure. She continues Ajovy and Nurtec.  She reports over 50% reduction in migraine frequency and intensity. Now having 3-4 migrainous headaches per month. Migraines are easily aborted with Nurtec.   03/26/21 ALL: Vanessa [name] presents for her first Botox procedure. Baseline daily headaches with 8-12 migrainous headaches per month. She continues Ajovy for migraine prevention. Amitriptyline prescribed by PCP. Nurtec helps with abortive therapy.    Consent Form Botulism Toxin Injection For Chronic Migraine    Reviewed orally with patient, additionally signature is on file:  Botulism toxin has been approved by the Federal drug administration for treatment of chronic migraine. Botulism toxin does not cure chronic migraine and it may not be effective in some patients.  The administration of botulism toxin is accomplished by injecting a small amount of toxin into the muscles of the neck and head. Dosage must be titrated for each individual. Any benefits resulting from botulism toxin tend to wear off after 3 months with a repeat injection required if benefit is to be maintained. Injections are usually done every 3-4 months with maximum effect peak achieved by about 2 or 3 weeks. Botulism toxin is expensive and you should be sure of what costs you will incur resulting from the injection.  The side effects of botulism toxin use for chronic migraine may include:   -Transient, and usually mild, facial weakness with facial injections  -Transient, and usually mild, head or neck weakness with head/neck injections  -Reduction or loss of forehead facial animation due to forehead muscle weakness  -Eyelid drooping  -Dry eye  -Pain at the site of injection or bruising at the site of injection  -Double vision  -Potential unknown long term risks   Contraindications: You should not have Botox if you are pregnant, nursing, allergic to albumin, have an infection, skin  condition, or muscle weakness at the site of the injection, or have myasthenia gravis, Lambert-Eaton syndrome, or ALS.  It is also possible that as with any injection, there may be an allergic reaction or no effect from the medication. Reduced effectiveness after repeated injections is sometimes seen and rarely infection at the injection site may occur. All care will be taken to prevent these side effects. If therapy is given over a long time, atrophy and wasting in the muscle injected may occur. Occasionally the patient's become refractory to treatment because they develop antibodies to the toxin. In this event, therapy needs to be modified.  I have read the above information and consent to the administration of botulism toxin.    BOTOX PROCEDURE NOTE FOR MIGRAINE HEADACHE  Contraindications and precautions discussed with patient(above). Aseptic procedure was observed and patient tolerated procedure. Procedure performed by [name], FNP-C.   The condition has existed for more than 6 months, and pt does not have a diagnosis of ALS, Myasthenia Gravis or Lambert-Eaton Syndrome.  Risks and benefits of injections discussed and pt agrees to proceed with the procedure.  Written consent obtained  These injections are medically necessary. Pt  receives good benefits from these injections. These injections do not cause sedations or hallucinations which the oral therapies may cause.   Description of procedure:  The patient was placed in a sitting position. The standard protocol was used for Botox as follows, with 5 units of Botox injected at each site:  -Procerus muscle, midline injection  -Corrugator muscle, bilateral injection  -Frontalis muscle, bilateral injection, with 2 sites each side, medial injection was performed in the upper  one third of the frontalis muscle, in the region vertical from the medial inferior edge of the superior orbital rim. The lateral injection was again in the upper one  third of the forehead vertically above the lateral limbus of the cornea, 1.5 cm lateral to the medial injection site.  -Temporalis muscle injection, 4 sites, bilaterally. The first injection was 3 cm above the tragus of the ear, second injection site was 1.5 cm to 3 cm up from the first injection site in line with the tragus of the ear. The third injection site was 1.5-3 cm forward between the first 2 injection sites. The fourth injection site was 1.5 cm posterior to the second injection site. 5th site laterally in the temporalis  muscleat the level of the outer canthus.  -Occipitalis muscle injection, 3 sites, bilaterally. The first injection was done one half way between the occipital protuberance and the tip of the mastoid process behind the ear. The second injection site was done lateral and superior to the first, 1 fingerbreadth from the first injection. The third injection site was 1 fingerbreadth superiorly and medially from the first injection site.  -Cervical paraspinal muscle injection, 2 sites, bilaterally. The first injection site was 1 cm from the midline of the cervical spine, 3 cm inferior to the lower border of the occipital protuberance. The second injection site was 1.5 cm superiorly and laterally to the first injection site.  -Trapezius muscle injection was performed at 3 sites, bilaterally. The first injection site was in the upper trapezius muscle halfway between the inflection point of the neck, and the acromion. The second injection site was one half way between the acromion and the first injection site. The third injection was done between the first injection site and the inflection point of the neck.   Will return for repeat injection in 3 months.   A total of 200 units of Botox was prepared, 155 units of Botox was injected as documented above, 45 units of Botox was wasted. The patient tolerated the procedure well, there were no complications of the above procedure.

## 2021-03-26 NOTE — Progress Notes (Signed)
Botox- 200 units x 1 vial Lot: [id number] Expiration: 10/2023 NDC: 9741-6384-53  Bacteriostatic 0.9% Sodium Chloride- 29mL total Lot: [id number] Expiration: 09/26/2022 NDC: 2122-4825-00  Dx: B70.488 B/B

## 2021-03-28 ENCOUNTER — Other Ambulatory Visit: Payer: Self-pay | Admitting: Family Medicine

## 2021-03-28 DIAGNOSIS — I10 Essential (primary) hypertension: Secondary | ICD-10-CM

## 2021-04-08 ENCOUNTER — Telehealth: Payer: 59 | Admitting: Nurse Practitioner

## 2021-04-08 DIAGNOSIS — R051 Acute cough: Secondary | ICD-10-CM

## 2021-04-08 MED ORDER — BENZONATATE 100 MG PO CAPS: 100.0000 mg | ORAL_CAPSULE | Freq: Three times a day (TID) | ORAL | 0 refills | Status: AC | PRN

## 2021-04-08 MED ORDER — PREDNISONE 10 MG (21) PO TBPK: ORAL_TABLET | ORAL | 0 refills | Status: DC

## 2021-04-08 NOTE — Progress Notes (Signed)
We are sorry that you are not feeling well.  Here is how we plan to help!  Based on your presentation I believe you most likely have A cough due to a virus.  This is called viral bronchitis and is best treated by rest, plenty of fluids and control of the cough.  You may use Ibuprofen or Tylenol as directed to help your symptoms.     In addition you may use A prescription cough medication called Tessalon Perles 100mg . You may take 1-2 capsules every 8 hours as needed for your cough.  Prednisone 10 mg daily for 6 days (see taper instructions below)  Directions for 6 day taper: Day 1: 2 tablets before breakfast, 1 after both lunch & dinner and 2 at bedtime Day 2: 1 tab before breakfast, 1 after both lunch & dinner and 2 at bedtime Day 3: 1 tab at each meal & 1 at bedtime Day 4: 1 tab at breakfast, 1 at lunch, 1 at bedtime Day 5: 1 tab at breakfast & 1 tab at bedtime Day 6: 1 tab at breakfast  I recommend COVID testing as well to ensure your symptoms are not related.   From your responses in the eVisit questionnaire you describe inflammation in the upper respiratory tract which is causing a significant cough.  This is commonly called Bronchitis and has four common causes:   Allergies Viral Infections Acid Reflux Bacterial Infection Allergies, viruses and acid reflux are treated by controlling symptoms or eliminating the cause. An example might be a cough caused by taking certain blood pressure medications. You stop the cough by changing the medication. Another example might be a cough caused by acid reflux. Controlling the reflux helps control the cough.  USE OF BRONCHODILATOR ("RESCUE") INHALERS: There is a risk from using your bronchodilator too frequently.  The risk is that over-reliance on a medication which only relaxes the muscles surrounding the breathing tubes can reduce the effectiveness of medications prescribed to reduce swelling and congestion of the tubes themselves.  Although you  feel brief relief from the bronchodilator inhaler, your asthma may actually be worsening with the tubes becoming more swollen and filled with mucus.  This can delay other crucial treatments, such as oral steroid medications. If you need to use a bronchodilator inhaler daily, several times per day, you should discuss this with your provider.  There are probably better treatments that could be used to keep your asthma under control.     HOME CARE Only take medications as instructed by your medical team. Complete the entire course of an antibiotic. Drink plenty of fluids and get plenty of rest. Avoid close contacts especially the very young and the elderly Cover your mouth if you cough or cough into your sleeve. Always remember to wash your hands A steam or ultrasonic humidifier can help congestion.   GET HELP RIGHT AWAY IF: You develop worsening fever. You become short of breath You cough up blood. Your symptoms persist after you have completed your treatment plan MAKE SURE YOU  Understand these instructions. Will watch your condition. Will get help right away if you are not doing well or get worse.    Thank you for choosing an e-visit.  Your e-visit answers were reviewed by a board certified advanced clinical practitioner to complete your personal care plan. Depending upon the condition, your plan could have included both over the counter or prescription medications.  Please review your pharmacy choice. Make sure the pharmacy is open so you  can pick up prescription now. If there is a problem, you may contact your provider through [employer] and have the prescription routed to another pharmacy.  Your safety is important to [place]. If you have drug allergies check your prescription carefully.   For the next 24 hours you can use MyChart to ask questions about today's visit, request a non-urgent call back, or ask for a work or school excuse. You will get an email in the next two days  asking about your experience. I hope that your e-visit has been valuable and will speed your recovery.  I have spent at least 5 minutes reviewing and documenting in the patient's chart.

## 2021-04-10 ENCOUNTER — Other Ambulatory Visit: Payer: Self-pay | Admitting: *Deleted

## 2021-04-10 DIAGNOSIS — G43709 Chronic migraine without aura, not intractable, without status migrainosus: Secondary | ICD-10-CM

## 2021-04-10 MED ORDER — AMITRIPTYLINE HCL 50 MG PO TABS: 50.0000 mg | ORAL_TABLET | Freq: Every day | ORAL | 0 refills | Status: DC

## 2021-04-26 ENCOUNTER — Other Ambulatory Visit: Payer: Self-pay

## 2021-04-26 ENCOUNTER — Ambulatory Visit (INDEPENDENT_AMBULATORY_CARE_PROVIDER_SITE_OTHER): Payer: 59 | Admitting: Pulmonary Disease

## 2021-04-26 ENCOUNTER — Encounter: Payer: Self-pay | Admitting: Pulmonary Disease

## 2021-04-26 VITALS — BP 140/82 | HR 100 | Temp 98.0°F | Ht 70.0 in | Wt 335.4 lb

## 2021-04-26 DIAGNOSIS — F5101 Primary insomnia: Secondary | ICD-10-CM

## 2021-04-26 DIAGNOSIS — R0683 Snoring: Secondary | ICD-10-CM | POA: Diagnosis not present

## 2021-04-26 NOTE — Progress Notes (Signed)
[name]  Chief Complaint  Patient presents with   Consult    Ref by Hendricks Limes for trouble falling and staying asleep.   [place] [facility] but [place] [facility].      Past Surgical History:  She  has a past surgical history that includes CSF shunt and Brain surgery.  Past Medical History:  Allergies, HTN, Migraine headaches, Pseudotumor cerebri, Idiopathic intracranial hypertension, s/p cerebral ventricular shunt, Sepsis 2021, DM, Depression  Constitutional:  BP 140/82 (BP Location: Left Wrist, Patient Position: Sitting)    Pulse 100    Temp 98 F (36.7 C) (Temporal)    Ht 5\' 10"  (1.778 m)    Wt (!) 335 lb 6.4 oz (152.1 kg)    SpO2 100% Comment: ra   BMI 48.12 kg/m   Brief Summary:  Vanessa [name] is a 46 y.o. female with obstructive sleep apnea and insomnia.      Subjective:   She had a sleep study several years ago with [facility].  Found to have sleep apnea.  She had a Respironics machine that was part of the recall.  She got a replacement machine, but couldn't get her SD card to fit in.  She thought she needed the SD card for the CPAP to work and stopped using CPAP.  She then turned in the machine because she thought it wasn't working, but wasn't able to get a replacement machine.  She snores and night and wakes up hearing herself snore.  She is a restless sleeper.  She has trouble falling asleep and staying asleep.  She can fall asleep when sitting in a car as a passenger.  She gets sleepy and tired in the afternoon and evening.  She talks in her sleep and gets leg cramps at night about once per week.  She goes to sleep at 10 pm.  She falls asleep after a while.  She wakes up several times to use the bathroom.  Once she wakes up it can take a while for her to fall back to sleep.  She gets out of bed at 7 am.  She feels okay in the morning.  She denies morning headache.  She does not use anything to help her stay awake.  She  has been using trazodone to help sleep, but still has trouble falling asleep and staying asleep.  She was started on prozac because she was told she could be depressed and this could affect her sleep.  She hasn't noticed any difference since being on prozac.  She doesn't feel like she is having trouble feeling depressed recently.  She denies sleep walking, bruxism, or nightmares.  There is no history of restless legs.  She denies sleep hallucinations, sleep paralysis, or cataplexy.  The Epworth score is 9 out of 24.   Physical Exam:   Appearance - well kempt   ENMT - no sinus tenderness, no oral exudate, no LAN, Mallampati 4 airway, no stridor  Respiratory - equal breath sounds bilaterally, no wheezing or rales  CV - s1s2 regular rate and rhythm, no murmurs  Ext - no clubbing, no edema  Skin - no rashes  Psych - normal mood and affect   Sleep Tests:    Social History:  She  reports that she has never smoked. She has never used smokeless tobacco. She reports that she does not drink alcohol and does not use drugs.  Family History:  Her family history includes Congestive Heart Failure in  her mother; Hypertension in her sister; Obesity in her sister; Pulmonary Hypertension in her mother.    Discussion:  She has snoring, sleep disruption, apnea and daytime sleepiness.  She has prior history of sleep apnea.  She has history of hypertension and diabetes.  Her BMI is > 35.  I am concerned she could still have significant obstructive sleep apnea.  She also reports symptoms of sleep onset and sleep maintenance insomnia.  Assessment/Plan:   Snoring with excessive daytime sleepiness. - will need to arrange for a home sleep study - will get records from [facility]  Insomnia. - continue trazodone for now - explained how untreated sleep apnea can sometimes mimic insomnia - assess further after reviewing her sleep study  Obesity. - discussed how weight can impact sleep and risk  for sleep disordered breathing - discussed options to assist with weight loss: combination of diet modification, cardiovascular and strength training exercises  Cardiovascular risk. - had an extensive discussion regarding the adverse health consequences related to untreated sleep disordered breathing - specifically discussed the risks for hypertension, coronary artery disease, cardiac dysrhythmias, cerebrovascular disease, and diabetes - lifestyle modification discussed  Safe driving practices. - discussed how sleep disruption can increase risk of accidents, particularly when driving - safe driving practices were discussed  Therapies for obstructive sleep apnea. - if the sleep study shows significant sleep apnea, then various therapies for treatment were reviewed: CPAP, oral appliance, and surgical interventions  Time Spent Involved in Patient Care on Day of Examination:  47 minutes  Follow up:   Patient Instructions  Will have you sign a release form to get medical records from [facility]  Will arrange for home sleep study  Will call to arrange for follow up after sleep study reviewed   Medication List:   Allergies as of 04/26/2021       Reactions   Metrizamide Other (See Comments), Itching, Photosensitivity, Shortness Of Breath, Swelling   update Migraine instantly   Mushroom Extract Complex Itching   Throat itching with cough   Peanut Oil Anaphylaxis, Other (See Comments)   update   Peanut-containing Drug Products Itching   Itching throat with cough   Diflucan In Dextrose [fluconazole In Dextrose] Hives   Iodinated Contrast Media    Iodine-131 Other (See Comments)   update        Medication List        Accurate as of April 26, 2021 11:58 AM. If you have any questions, ask your nurse or doctor.          Ajovy 225 MG/1.5ML Soaj Generic drug: Fremanezumab-vfrm Inject 225 mg into the skin every 30 (thirty) days.   amitriptyline 50 MG tablet Commonly  known as: ELAVIL Take 1 tablet (50 mg total) by mouth daily.   atorvastatin 80 MG tablet Commonly known as: LIPITOR Take 1 tablet (80 mg total) by mouth daily.   cyclobenzaprine 10 MG tablet Commonly known as: FLEXERIL TAKE 1 TABLET BY MOUTH THREE TIMES A DAY AS NEEDED FOR MUSCLE SPASMS   empagliflozin 25 MG Tabs tablet Commonly known as: Jardiance Take 1 tablet (25 mg total) by mouth daily before breakfast.   flunisolide 25 MCG/ACT (0.025%) Soln Commonly known as: NASALIDE Instill 2 sprays into each nostril twice daily.   FLUoxetine 20 MG capsule Commonly known as: PROZAC Take 1 capsule by mouth every evening.   furosemide 40 MG tablet Commonly known as: LASIX Take 1 tablet (40 mg total) by mouth daily.   glucose  blood test strip Use to test sugar daily. Dx E11.65   halobetasol 0.05 % cream Commonly known as: ULTRAVATE Apply topically 2 (two) times daily.   lisinopril 40 MG tablet Commonly known as: ZESTRIL Take 1 tablet (40 mg total) by mouth daily.   loratadine 10 MG tablet Commonly known as: CLARITIN Take 1 tablet by mouth daily.   megestrol 40 MG tablet Commonly known as: MEGACE TAKE BY MOUTH 3 TABLETS A DAY FOR 5 DAYS, THEN 2 TABLETS A DAY FOR 5 DAYS, THEN 1 TABLET DAILY   metoprolol succinate 25 MG 24 hr tablet Commonly known as: TOPROL-XL TAKE 1 TABLET (25 MG TOTAL) BY MOUTH DAILY.   montelukast 10 MG tablet Commonly known as: SINGULAIR TAKE 1 TABLET BY MOUTH EVERYDAY AT BEDTIME   multivitamin tablet Take 1 tablet by mouth daily.   naproxen 500 MG tablet Commonly known as: NAPROSYN Take 500 mg by mouth 2 (two) times daily.   neomycin-polymyxin-hydrocortisone OTIC solution Commonly known as: CORTISPORIN Place 3 drops into both ears 4 (four) times daily.   Nurtec 75 MG Tbdp Generic drug: Rimegepant Sulfate Take 75 mg by mouth daily as needed. Onset migraine (max one daily)   OneTouch Delica Lancets 78M Misc 1 each by Does not apply route  daily. Dx E11.9   Oxycodone HCl 10 MG Tabs Take 1 tablet (10 mg total) by mouth 2 (two) times daily as needed.   Oxycodone HCl 10 MG Tabs Take 1 tablet (10 mg total) by mouth 2 (two) times daily as needed.   Oxycodone HCl 10 MG Tabs Take 1 tablet (10 mg total) by mouth 2 (two) times daily as needed. Start taking on: May 01, 2021   predniSONE 10 MG (21) Tbpk tablet Commonly known as: STERAPRED UNI-PAK 21 TAB Day 1: 2 tablets before breakfast, 1 after both lunch & dinner and 2 at bedtime. Day 2: 1 tab before breakfast, 1 after both lunch & dinner and 2 at bedtime. Day 3: 1 tab at each meal & 1 at bedtime. Day 4: 1 tab at breakfast, 1 at lunch, 1 at bedtime. Day 5: 1 tab at breakfast & 1 tab at bedtime. Day 6: 1 tab at breakfast   spironolactone 100 MG tablet Commonly known as: ALDACTONE Take 1 tablet (100 mg total) by mouth 2 (two) times daily.   TART CHERRY PO Take by mouth.   traZODone 50 MG tablet Commonly known as: DESYREL TAKE 1-2 TABLETS BY MOUTH AT BEDTIME AS NEEDED FOR SLEEP.   Trulicity 4.5 VE/7.2CN Sopn Generic drug: Dulaglutide Inject 4.5 mg as directed once a week.        Signature:  Chesley Mires, MD [place] Pager - [phone] 04/26/2021, 11:58 AM

## 2021-04-26 NOTE — Patient Instructions (Signed)
Will have you sign a release form to get medical records from [facility] ? ?Will arrange for home sleep study ? ?Will call to arrange for follow up after sleep study reviewed ? ?

## 2021-05-12 ENCOUNTER — Other Ambulatory Visit: Payer: Self-pay | Admitting: Family Medicine

## 2021-05-12 DIAGNOSIS — E1165 Type 2 diabetes mellitus with hyperglycemia: Secondary | ICD-10-CM

## 2021-05-14 NOTE — Telephone Encounter (Signed)
Has f/u appt on 3/22 ?

## 2021-05-14 NOTE — Telephone Encounter (Signed)
30 days given pt ntbs  ?

## 2021-05-16 ENCOUNTER — Ambulatory Visit: Payer: 59 | Admitting: Family Medicine

## 2021-05-16 ENCOUNTER — Ambulatory Visit: Payer: 59

## 2021-05-17 ENCOUNTER — Ambulatory Visit (INDEPENDENT_AMBULATORY_CARE_PROVIDER_SITE_OTHER): Payer: 59 | Admitting: Family Medicine

## 2021-05-17 ENCOUNTER — Encounter: Payer: Self-pay | Admitting: Family Medicine

## 2021-05-17 VITALS — BP 143/81 | HR 99 | Temp 97.8°F | Ht 70.0 in | Wt 324.0 lb

## 2021-05-17 DIAGNOSIS — Z6841 Body Mass Index (BMI) 40.0 and over, adult: Secondary | ICD-10-CM

## 2021-05-17 DIAGNOSIS — Z1211 Encounter for screening for malignant neoplasm of colon: Secondary | ICD-10-CM

## 2021-05-17 DIAGNOSIS — I10 Essential (primary) hypertension: Secondary | ICD-10-CM | POA: Diagnosis not present

## 2021-05-17 DIAGNOSIS — E1165 Type 2 diabetes mellitus with hyperglycemia: Secondary | ICD-10-CM | POA: Diagnosis not present

## 2021-05-17 DIAGNOSIS — E1169 Type 2 diabetes mellitus with other specified complication: Secondary | ICD-10-CM | POA: Diagnosis not present

## 2021-05-17 DIAGNOSIS — M542 Cervicalgia: Secondary | ICD-10-CM

## 2021-05-17 DIAGNOSIS — Z79899 Other long term (current) drug therapy: Secondary | ICD-10-CM

## 2021-05-17 DIAGNOSIS — T781XXA Other adverse food reactions, not elsewhere classified, initial encounter: Secondary | ICD-10-CM

## 2021-05-17 DIAGNOSIS — M170 Bilateral primary osteoarthritis of knee: Secondary | ICD-10-CM | POA: Diagnosis not present

## 2021-05-17 DIAGNOSIS — G479 Sleep disorder, unspecified: Secondary | ICD-10-CM

## 2021-05-17 DIAGNOSIS — E785 Hyperlipidemia, unspecified: Secondary | ICD-10-CM

## 2021-05-17 DIAGNOSIS — G4733 Obstructive sleep apnea (adult) (pediatric): Secondary | ICD-10-CM

## 2021-05-17 LAB — BAYER DCA HB A1C WAIVED: HB A1C (BAYER DCA - WAIVED): 8.2 % — ABNORMAL HIGH (ref 4.8–5.6)

## 2021-05-17 MED ORDER — CYCLOBENZAPRINE HCL 10 MG PO TABS: 10.0000 mg | ORAL_TABLET | Freq: Three times a day (TID) | ORAL | 2 refills | Status: DC | PRN

## 2021-05-17 MED ORDER — OXYCODONE HCL 10 MG PO TABS: 10.0000 mg | ORAL_TABLET | Freq: Two times a day (BID) | ORAL | 0 refills | Status: DC | PRN

## 2021-05-17 MED ORDER — LISINOPRIL 40 MG PO TABS: 40.0000 mg | ORAL_TABLET | Freq: Every day | ORAL | 1 refills | Status: DC

## 2021-05-17 MED ORDER — SPIRONOLACTONE 100 MG PO TABS: 100.0000 mg | ORAL_TABLET | Freq: Two times a day (BID) | ORAL | 1 refills | Status: DC

## 2021-05-17 NOTE — Progress Notes (Signed)
? ?Assessment & Plan:  ?1. Type 2 diabetes mellitus with hyperglycemia, without long-term current use of insulin ([place]) ?Lab Results  ?Component Value Date  ? HGBA1C 8.2 (H) 05/17/2021  ? HGBA1C 10.2 (H) 01/04/2021  ? HGBA1C 6.5 10/04/2020  ? ?- Diabetes is not at goal of A1c < 7, but is improving. ?- Medications: continue current medications. No changes made today since patient reports good readings prior to the past month with multiple steroids. ?- Home glucose monitoring: continue monitoring. ?- Patient is currently taking a statin. Patient is taking an ACE-inhibitor/ARB.  ?- Instruction/counseling given: reminded to get eye exam ? ?Diabetes Health Maintenance Due  ?Topic Date Due  ? OPHTHALMOLOGY EXAM  02/07/2021  ? HEMOGLOBIN A1C  07/04/2021  ? FOOT EXAM  05/18/2022  ?  ?Lab Results  ?Component Value Date  ? LABMICR See below: 01/27/2020  ? LABMICR 19.5 07/06/2019  ? ?- Lipid panel ?- Bayer DCA Hb A1c Waived ?- Microalbumin / creatinine urine ratio ? ?2. Essential hypertension ?- Lipid panel ?- lisinopril (ZESTRIL) 40 MG tablet; Take 1 tablet (40 mg total) by mouth daily.  Dispense: 90 tablet; Refill: 1 ?- spironolactone (ALDACTONE) 100 MG tablet; Take 1 tablet (100 mg total) by mouth 2 (two) times daily.  Dispense: 180 tablet; Refill: 1 ? ?3. Hyperlipidemia associated with type 2 diabetes mellitus ([place]) ?- Lipid panel ? ?4-5. Bilateral primary osteoarthritis of knee/Controlled substance agreement signed ?Well controlled on current regimen. Controlled substance agreement in place. Urine drug screen as expected. PDMP reviewed with no concerning findings.  ?- Oxycodone HCl 10 MG TABS; Take 1 tablet (10 mg total) by mouth 2 (two) times daily as needed.  Dispense: 60 tablet; Refill: 0 ?- Oxycodone HCl 10 MG TABS; Take 1 tablet (10 mg total) by mouth 2 (two) times daily as needed.  Dispense: 60 tablet; Refill: 0 ?- Oxycodone HCl 10 MG TABS; Take 1 tablet (10 mg total) by mouth 2 (two) times daily as needed.   Dispense: 60 tablet; Refill: 0 ? ?6. Cervical pain ?Well controlled on current regimen.  ?- cyclobenzaprine (FLEXERIL) 10 MG tablet; Take 1 tablet (10 mg total) by mouth 3 (three) times daily as needed for muscle spasms.  Dispense: 60 tablet; Refill: 2 ? ?7. Morbid obesity with BMI of 50.0-59.9, adult ([place]) ?Encouraged healthy eating and exercise.  Patient has lost 11 lbs over the past 3 weeks. ? ?8. Difficulty sleeping ?Continue Trazodone for now, but I suspect some of her difficulty sleeping is related to her untreated sleep apnea.  Weaning off of Prozac since patient has never had any issues with depression.  Patient to decrease Prozac to 20 mg every other day x3 weeks, then decrease to twice weekly x2 weeks, then once weekly x2 weeks, then stop.  ? ?9. OSA (obstructive sleep apnea) ?Continue following with pulmonology.  Patient has not yet received her home sleep study. ? ?10. 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 ?- Alpha-Gal Panel ? ?11. Colon cancer screening ?- Ambulatory referral to Gastroenterology ? ? ?Return in about 3 months (around 08/17/2021) for follow-up of chronic medication conditions. ? ?Hendricks Limes, MSN, APRN, FNP-C ?[place] ? ?Subjective:  ? ? Patient ID: Vanessa [name], female    DOB: Mar 13, 1975, 46 y.o.   MRN: [id number] ? ?Patient Care Team: ?Loman Brooklyn, FNP as PCP - General (Family Medicine)  ? ?Chief Complaint:  ?Chief Complaint  ?Patient presents with  ?  Medical Management of Chronic Issues  ? red meat  ?  Patient states in the last two weeks every time she eats red meat she vomits and has diarrhea.   ? ? ?HPI: ?Jess Sulak is a 46 y.o. female presenting on 05/17/2021 for Medical Management of Chronic Issues and red meat (Patient states in the last two weeks every time she eats red meat she vomits and has diarrhea. ) ? ?Pain assessment: ?Cause of pain- osteoarthritis ?Pain location- ankles,  knees, and sometimes her back ?Pain on scale of 1-10- 7-8/10 without medication, 1-2/10 with medication ?Frequency- daily ?What increases pain- walking too much and heavy lifting ?What makes pain better- medication, bring her legs up, and ice ?Effects on ADL- sometimes she is unable to bend the knee and is unable to shower at that time.  She has to wait till her pain medicine kicks in before she can take a shower. ?Any change in general medical condition- none ? ?Current opioids rx- Oxycodone 10 mg BID PRN ?# meds rx- 65 ?Effectiveness of current meds- effective ?Adverse reactions from pain meds- none ?Morphine equivalent- 30 MME/day ? ?Pill count performed-No ?Last drug screen - 07/04/2020 ?( high risk q58m moderate risk q622mlow risk yearly ) ?Urine drug screen today- No ?[place] ? If yes were their any concerning findings? - No ? ?Overdose risk: 8061 ? ?  04/06/2020  ?  1:58 PM  ?Opioid Risk   ?Alcohol 0  ?Illegal Drugs 0  ?Rx Drugs 0  ?Alcohol 0  ?Illegal Drugs 0  ?Rx Drugs 0  ?Age between 1613-45ears  1  ?History of Preadolescent Sexual Abuse 0  ?Psychological Disease 0  ?Depression 0  ?Opioid Risk Tool Scoring 1  ?Opioid Risk Interpretation Low Risk  ? ?Pain contract signed on: 07/04/2020  ? ?Diabetes with hypertension and hyperlipidemia: Patient presents for follow up of diabetes. Current symptoms include: none. Known diabetic complications: cardiovascular disease. Medication compliance: yes - Trulicity 4.5 mg weekly and Jardiance 25 mg daily. Current diet: in general, a "healthy" diet  , hardly no bread, little sweets and pasta, soda only every once in a while . Current exercise: walking and weightlifting. Home blood sugar records:  Were good for 2 months after increasing Jardiance, but increased over the past month as she has been on steroids due to bronchitis and receiving a migraine cocktail with steroids and it . Is she  on ACE inhibitor or angiotensin II receptor blocker? Yes  (Lisinopril). Is she on a statin? Yes (Atorvastatin).  ? ?Difficulty Sleeping: She was initiated on Trazodone previously. She states it is helping her fall asleep but she is still having trouble staying asleep. She is currently being worked up for sleep apnea. She reports years ago she was started on Prozac due to her sleep issues as it was felt she had depression.  Patient denies ever having feelings of depression. ? ?Sleep Apnea: patient saw pulmonology on 04/26/2021 at which time a home sleep study was ordered.   ? ? ?New complaints: ?Patient reports every time she eats red meat for the past two weeks she experiences vomiting and diarrhea 2-3 hours after she eats. Denies a rash. No recent tick bites.  ? ? ?Social history: ? ?Relevant past medical, surgical, family and social history reviewed and updated as indicated. Interim medical history since our last visit reviewed. ? ?Allergies and medications reviewed and updated. ? ?DATA REVIEWED: CHART IN EPIC ? ?ROS: Negative unless specifically indicated  above in HPI.  ? ? ?Current Outpatient Medications:  ?  amitriptyline (ELAVIL) 50 MG tablet, Take 1 tablet (50 mg total) by mouth daily., Disp: 90 tablet, Rfl: 0 ?  atorvastatin (LIPITOR) 80 MG tablet, Take 1 tablet (80 mg total) by mouth daily., Disp: 90 tablet, Rfl: 1 ?  cyclobenzaprine (FLEXERIL) 10 MG tablet, TAKE 1 TABLET BY MOUTH THREE TIMES A DAY AS NEEDED FOR MUSCLE SPASMS, Disp: 60 tablet, Rfl: 2 ?  Dulaglutide (TRULICITY) 4.5 EH/6.3JS SOPN, Inject 4.5 mg as directed once a week., Disp: 6 mL, Rfl: 1 ?  empagliflozin (JARDIANCE) 25 MG TABS tablet, Take 1 tablet (25 mg total) by mouth daily before breakfast. Needs office visit for further refills., Disp: 30 tablet, Rfl: 0 ?  flunisolide (NASALIDE) 25 MCG/ACT (0.025%) SOLN, Instill 2 sprays into each nostril twice daily., Disp: 25 mL, Rfl: 1 ?  FLUoxetine (PROZAC) 20 MG capsule, Take 1 capsule by mouth every evening., Disp: 30 capsule, Rfl: 1 ?  Fremanezumab-vfrm  (AJOVY) 225 MG/1.5ML SOAJ, Inject 225 mg into the skin every 30 (thirty) days., Disp: 4.5 mL, Rfl: 3 ?  furosemide (LASIX) 40 MG tablet, Take 1 tablet (40 mg total) by mouth daily., Disp: 90 tablet, Rfl: 1 ?  glucose blo

## 2021-05-17 NOTE — Patient Instructions (Addendum)
Please schedule your eye exam.  ? ?Decrease Prozac to 20 mg every other day x3 weeks, then decrease to twice weekly x2 weeks, then once weekly x2 weeks, then stop.   ?

## 2021-05-18 LAB — LIPID PANEL
Chol/HDL Ratio: 5 ratio — ABNORMAL HIGH (ref 0.0–4.4)
Cholesterol, Total: 154 mg/dL (ref 100–199)
HDL: 31 mg/dL — ABNORMAL LOW (ref >39)
LDL Chol Calc (NIH): 101 mg/dL — ABNORMAL HIGH (ref 0–99)
Triglycerides: 121 mg/dL (ref 0–149)
VLDL Cholesterol Cal: 22 mg/dL (ref 5–40)

## 2021-05-18 LAB — MICROALBUMIN / CREATININE URINE RATIO
Creatinine, Urine: 121.1 mg/dL
Microalb/Creat Ratio: <2 mg/g creat (ref 0–29)
Microalbumin, Urine: <3 ug/mL

## 2021-05-21 ENCOUNTER — Other Ambulatory Visit: Payer: Self-pay | Admitting: Family Medicine

## 2021-05-21 ENCOUNTER — Encounter: Payer: Self-pay | Admitting: Family Medicine

## 2021-05-21 DIAGNOSIS — Z91018 Allergy to other foods: Secondary | ICD-10-CM

## 2021-05-21 HISTORY — DX: Allergy to other foods: Z91.018

## 2021-05-21 LAB — ALPHA-GAL PANEL
Allergen Lamb IgE: 2.26 kU/L — AB
Beef IgE: 2.87 kU/L — AB
IgE (Immunoglobulin E), Serum: 774 IU/mL — ABNORMAL HIGH (ref 6–495)
O215-IgE Alpha-Gal: 7.95 kU/L — AB
Pork IgE: 1.18 kU/L — AB

## 2021-05-21 LAB — SPECIMEN STATUS REPORT

## 2021-05-21 MED ORDER — EPINEPHRINE 0.3 MG/0.3ML IJ SOAJ: 0.3000 mg | INTRAMUSCULAR | 1 refills | Status: DC | PRN

## 2021-05-29 ENCOUNTER — Other Ambulatory Visit: Payer: Self-pay | Admitting: Family Medicine

## 2021-05-29 DIAGNOSIS — J302 Other seasonal allergic rhinitis: Secondary | ICD-10-CM

## 2021-06-08 ENCOUNTER — Other Ambulatory Visit: Payer: Self-pay | Admitting: Family Medicine

## 2021-06-08 DIAGNOSIS — E1165 Type 2 diabetes mellitus with hyperglycemia: Secondary | ICD-10-CM

## 2021-06-25 ENCOUNTER — Ambulatory Visit (INDEPENDENT_AMBULATORY_CARE_PROVIDER_SITE_OTHER): Payer: 59 | Admitting: Family Medicine

## 2021-06-25 DIAGNOSIS — G43709 Chronic migraine without aura, not intractable, without status migrainosus: Secondary | ICD-10-CM

## 2021-06-25 NOTE — Progress Notes (Signed)
? ?06/25/2021 ALL: Taiya returns for 3rd procedure. She continues Ajovy and [place]. She is doing well. 4-5 migraines on average. Nurtec works well.  ? ?03/26/2021 ALL: Vanessa [name] returns for 2nd Botox procedure. She continues Ajovy and Nurtec.  She reports over 50% reduction in migraine frequency and intensity. Now having 3-4 migrainous headaches per month. Migraines are easily aborted with Nurtec.  ? ?01/01/2021 ALL: Mallerie presents for her first Botox procedure. Baseline daily headaches with 8-12 migrainous headaches per month. She continues Ajovy for migraine prevention. Amitriptyline prescribed by PCP. Nurtec helps with abortive therapy.  ? ? ?Consent Form ?Botulism Toxin Injection For Chronic Migraine ? ? ? ?Reviewed orally with patient, additionally signature is on file: ? ?Botulism toxin has been approved by the Federal drug administration for treatment of chronic migraine. Botulism toxin does not cure chronic migraine and it may not be effective in some patients. ? ?The administration of botulism toxin is accomplished by injecting a small amount of toxin into the muscles of the neck and head. Dosage must be titrated for each individual. Any benefits resulting from botulism toxin tend to wear off after 3 months with a repeat injection required if benefit is to be maintained. Injections are usually done every 3-4 months with maximum effect peak achieved by about 2 or 3 weeks. Botulism toxin is expensive and you should be sure of what costs you will incur resulting from the injection. ? ?The side effects of botulism toxin use for chronic migraine may include: ? ? -Transient, and usually mild, facial weakness with facial injections ? -Transient, and usually mild, head or neck weakness with head/neck injections ? -Reduction or loss of forehead facial animation due to forehead muscle weakness ? -Eyelid drooping ? -Dry eye ? -Pain at the site of injection or bruising at the site of injection ? -Double vision ? -Potential  unknown long term risks ? ? ?Contraindications: You should not have Botox if you are pregnant, nursing, allergic to albumin, have an infection, skin condition, or muscle weakness at the site of the injection, or have myasthenia gravis, Lambert-Eaton syndrome, or ALS. ? ?It is also possible that as with any injection, there may be an allergic reaction or no effect from the medication. Reduced effectiveness after repeated injections is sometimes seen and rarely infection at the injection site may occur. All care will be taken to prevent these side effects. If therapy is given over a long time, atrophy and wasting in the muscle injected may occur. Occasionally the patient's become refractory to treatment because they develop antibodies to the toxin. In this event, therapy needs to be modified. ? ?I have read the above information and consent to the administration of botulism toxin. ? ? ? ?BOTOX PROCEDURE NOTE FOR MIGRAINE HEADACHE ? ?Contraindications and precautions discussed with patient(above). Aseptic procedure was observed and patient tolerated procedure. Procedure performed by [name], FNP-C.  ? ?The condition has existed for more than 6 months, and pt does not have a diagnosis of ALS, Myasthenia Gravis or Lambert-Eaton Syndrome.  Risks and benefits of injections discussed and pt agrees to proceed with the procedure.  Written consent obtained ? ?These injections are medically necessary. Pt  receives good benefits from these injections. These injections do not cause sedations or hallucinations which the oral therapies may cause. ? ? ?Description of procedure: ? ?The patient was placed in a sitting position. The standard protocol was used for Botox as follows, with 5 units of Botox injected at each site: ? ?-Procerus  muscle, midline injection ? ?-Corrugator muscle, bilateral injection ? ?-Frontalis muscle, bilateral injection, with 2 sites each side, medial injection was performed in the upper one third of the  frontalis muscle, in the region vertical from the medial inferior edge of the superior orbital rim. The lateral injection was again in the upper one third of the forehead vertically above the lateral limbus of the cornea, 1.5 cm lateral to the medial injection site. ? ?-Temporalis muscle injection, 4 sites, bilaterally. The first injection was 3 cm above the tragus of the ear, second injection site was 1.5 cm to 3 cm up from the first injection site in line with the tragus of the ear. The third injection site was 1.5-3 cm forward between the first 2 injection sites. The fourth injection site was 1.5 cm posterior to the second injection site. 5th site laterally in the temporalis  muscleat the level of the outer canthus. ? ?-Occipitalis muscle injection, 3 sites, bilaterally. The first injection was done one half way between the occipital protuberance and the tip of the mastoid process behind the ear. The second injection site was done lateral and superior to the first, 1 fingerbreadth from the first injection. The third injection site was 1 fingerbreadth superiorly and medially from the first injection site. ? ?-Cervical paraspinal muscle injection, 2 sites, bilaterally. The first injection site was 1 cm from the midline of the cervical spine, 3 cm inferior to the lower border of the occipital protuberance. The second injection site was 1.5 cm superiorly and laterally to the first injection site. ? ?-Trapezius muscle injection was performed at 3 sites, bilaterally. The first injection site was in the upper trapezius muscle halfway between the inflection point of the neck, and the acromion. The second injection site was one half way between the acromion and the first injection site. The third injection was done between the first injection site and the inflection point of the neck. ? ? ?Will return for repeat injection in 3 months. ? ? ?A total of 200 units of Botox was prepared, 155 units of Botox was injected as  documented above, 45 units of Botox was wasted. The patient tolerated the procedure well, there were no complications of the above procedure. ? ? ?

## 2021-06-25 NOTE — Progress Notes (Signed)
Botox- 200 units x 1 vial ?Lot: [id number] ?Expiration: 01/2024 ?[place]: [phone] ? ?Bacteriostatic 0.9% Sodium Chloride- 10m total ?Lot: [id number]?Expiration: 09/26/2022 ?NCoffee [id number]? ?Dx: GY03.496?B/B  ?

## 2021-06-26 ENCOUNTER — Other Ambulatory Visit: Payer: Self-pay | Admitting: Family Medicine

## 2021-06-26 DIAGNOSIS — I10 Essential (primary) hypertension: Secondary | ICD-10-CM

## 2021-06-28 ENCOUNTER — Encounter: Payer: Self-pay | Admitting: Family Medicine

## 2021-06-28 ENCOUNTER — Other Ambulatory Visit: Payer: Self-pay | Admitting: Family Medicine

## 2021-06-28 DIAGNOSIS — E1165 Type 2 diabetes mellitus with hyperglycemia: Secondary | ICD-10-CM

## 2021-06-28 DIAGNOSIS — I10 Essential (primary) hypertension: Secondary | ICD-10-CM

## 2021-06-28 DIAGNOSIS — E785 Hyperlipidemia, unspecified: Secondary | ICD-10-CM

## 2021-06-28 DIAGNOSIS — J302 Other seasonal allergic rhinitis: Secondary | ICD-10-CM

## 2021-07-04 ENCOUNTER — Telehealth: Payer: Self-pay | Admitting: Family Medicine

## 2021-07-04 DIAGNOSIS — Z79899 Other long term (current) drug therapy: Secondary | ICD-10-CM

## 2021-07-04 DIAGNOSIS — M170 Bilateral primary osteoarthritis of knee: Secondary | ICD-10-CM

## 2021-07-04 MED ORDER — OXYCODONE HCL 10 MG PO TABS: 10.0000 mg | ORAL_TABLET | Freq: Two times a day (BID) | ORAL | 0 refills | Status: DC | PRN

## 2021-07-04 NOTE — Telephone Encounter (Signed)
Spoke to [facility] and they state they have been out of oxy '10mg'$  and has been for 3 weeks. States she knows [facility] [place] [facility].  Advise [facility] [place] [facility] rx. Please send in ?

## 2021-07-04 NOTE — Telephone Encounter (Signed)
Patient aware.

## 2021-07-04 NOTE — Telephone Encounter (Signed)
Please verify this prior to me sending. ?

## 2021-07-04 NOTE — Telephone Encounter (Signed)
Prescription sent

## 2021-07-05 ENCOUNTER — Encounter: Payer: Self-pay | Admitting: Family Medicine

## 2021-07-05 DIAGNOSIS — Z79899 Other long term (current) drug therapy: Secondary | ICD-10-CM

## 2021-07-05 DIAGNOSIS — M170 Bilateral primary osteoarthritis of knee: Secondary | ICD-10-CM

## 2021-07-05 NOTE — Patient Instructions (Signed)
Below is our plan: ? ?We will continue Ajovy and Nurtec.Restart Diamox '500mg'$  daily for 1 week, then increase to '500mg'$  twice daily if tolerating well. I have ordered labs to be done to check kidney function and potassium levels in 2 weeks. You should be able to have these drawn at PCP. Call me with any concerns.  ? ?Please make sure you are staying well hydrated. I recommend 50-60 ounces daily. Well balanced diet and regular exercise encouraged. Consistent sleep schedule with 6-8 hours recommended.  ? ?Please continue follow up with care team as directed.  ? ?Follow up with me in months, Botox as scheduled in July  ? ?You may receive a survey regarding today's visit. I encourage you to leave honest feed back as I do use this information to improve patient care. Thank you for seeing me today!  ? ? ?

## 2021-07-05 NOTE — Progress Notes (Signed)
? ? ?Chief Complaint  ?Patient presents with  ? Follow-up  ?  Rm 1, alone. Here to f/u for migraines. Pt reports starting hto have pressure HA. Head feels like its going to explode. Recent saw her eye Dr. And optic nerve swelling was noticed, will be RV back in 6 month to confirm dx.   ? ? ?HISTORY OF PRESENT ILLNESS: ? ?07/09/21 ALL:  ?Vanessa [name] returns for follow up following initiation of Botox for migraines. She continues Ajovy and Nurtec. Last procedure 06/25/2021. She feels that migraines are much better. She has about 4-5 migraine days a month, previously reporting daily headaches with 8-12 migraines. Nurtec works well for abortive therapy.  ? ?She reports more pressure headaches over the past few months. She feels that her head is going to explode at times. She reports blurred vision at times (with and without headache). No vision loss or TVO. She reports eye exam last month showed worsening optic nerve edema. She has follow up in 10/2021. BP has been better, usually between 135-145/80's. She is current on furosemide '40mg'$  daily and spironolactone '100mg'$  BID for peripheral edema. Also taking metoprolol daily.  ? ?She was diagnosed with alpha gal. She has eliminated milk and red meats from her diet. She is eating mostly chicken, [place] and fish. She reports losing 10lbs. She has never worked with medical weight management. She lives in [place], [place]. A1C improved from 10.2 to 8.2 over past 6 months.  ? ?11/30/2021 ALL:  ?Vanessa [name] returns for follow up for migraines. She was switched from [facility] to [facility] at last visit 04/2020. Amitriptyline continued per PCP and Nurtec for abortive therapy. She feels that she may be doing a little better on Ajovy. Nurtec works well for abortive therapy. No vision changes or vision loss, some blurred vision with headache. She is scheduled for eye exam next week. She reports that BP has been elevated at home (145/80-90). She relates this to being nervous about a recent biopsy with GYN.  She found out yesterday everything was fine with biopsy. She has a hard time sleeping, since childhood. She is on zaleplon. She feels that she just lies awake at night. Usually only gets 4 hours of sleep each night. She reports CBGs are stable. Last A1C 7.0. She feels mood is good on fluoxetine.  ? ?From a thorough review of notes patient's been on multiple medications that can be used in iih and migraine management including acetazolamide (made her feels really bad), topiramate (made headaches worse), propranolol (can't remember but reports side effects), amitriptyline (on now), amlodipine, Benadryl, gabapentin, lisinopril, Zofran, promethazine, naratriptan, maxalt, sumatriptan ? ?05/25/2020 ALL:  ?Vanessa [name] is a 46 y.o. female here today for follow up for headache follow up. 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 MRI 11/2019 was unremarkable. She was started on Emgality (Ajovy not approved).  Diamox and amitriptyline continued (prescribed by PCP). She reports that around 11/2019 she stopped Diamox due to reports of nausea. She was doing very well from 11/2019 until 04/2020. Over the past month she has noticed worsening headaches. She is having near daily dull, achy headaches in the frontal region. She is having 1-2 migraines a week. She reports smelling diesel fuel before during and after headache. She has nausea and blurred vision with headache. No vision loss in left eye. Blind in right eye. Nurtec helps most with abortive therapy.  A1C at goal. BP has been elevated. She  feels that this is due to headaches. She is taking Oxycodone '10mg'$  PRN (1-2 times daily) for knee pain. She has constipation. She reports neuro opthalmology exam was normal with no concerns of optic nerve edema about 2 months ago.  ? ?She was seen 3/25 in the ER for classic migraine aborted with migraine cocktail and Fioricet. CT was normal, stable position of shunt and  decompressed ventricles.  ? ? ?HISTORY (copied from Dr Cathren Laine previous note) ? ?HPI:  Vanessa [name] is a 46 y.o. female here as requested by Loman Brooklyn, FNP for migraines.  Patient has a past medical history of IDIOPATHIC INTRACRANIAL HYPERTENSION,  congenital blindness OD, migraines, morbid obesity, hypertension, vision loss, PCOS, diabetes, hyperlipidemia, congenital blindness of right eye, OD glaucoma. 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  She has been evaluated in the past by Dr. Jola Schmidt) as well as Dr. Octavia Bruckner Martin(neuro-Ophthalmology) and now she sees a neuro- ophthalmologist elsewhere. ?  ?Last time she saw neurosurgery was in 2019 and at that time the shunt was in good working condition. Headaches improved. She is still getting migraines however. Some days it feels like head is going to explode. She has not lost weight. She has daily headaches and migraines 2x a week. She wakes up with headaches, not using her cpap. Positional worsening headache, vision changes and blurry vision.The migraines are throbbing, she may smell finny things prior to migraine. Migraines can last 3 days, severe, at least 15 migraine days a month for over a year, light/sound sensitivity, nausea, no  vomiting. Daily headaches. Congenital blindness right eye. Headaches are worse in the morning and with position. No weakness, numbness. No other focal neurologic deficits, associated symptoms, inciting events or modifiable factors.  Last seen by neurosurgery in April 2019. ?  ?Reviewed notes, labs and imaging from outside physicians, which showed: ?  ?Reviewed imaging from January 2019 CT of the head, interval placement of a left frontal approach VP shunt catheter, slitlike appearance of the lateral and third ventricles.  Evidence of a prior right frontal burr hole site.  XR shunt series showed left VP shunt catheter tubing coursing along the left hemithorax and into the abdomen and terminates into the left lower quadrant, no shunt kinking or discontinuity.  ?  ?From a thorough review of notes patient's been on multiple medications that can be used in iih and migraine management including acetazolamide, propranolol, amitriptyline, amlodipine, Benadryl, gabapentin, lisinopril, Zofran, promethazine,topamax, naratriptan, maxalt, sumatriptan ?  ? ?REVIEW OF SYSTEMS: Out of a complete 14 system review of symptoms, the patient complains only of the following symptoms, headaches, chronic knee pain, blurred vision, and all other reviewed systems are negative. ? ? ?ALLERGIES: ?Allergies  ?Allergen Reactions  ? Metrizamide Other (See Comments), Itching, Photosensitivity, Shortness Of Breath and Swelling  ?  update ?Migraine instantly  ? Mushroom Extract  Complex Itching  ?  Throat itching with cough  ? Peanut Oil Anaphylaxis and Other (See Comments)  ?  update  ? Peanut-Containing Drug Products Itching  ?  Itching throat with cough ?Itching throat with cough  ? Diflucan In Dextrose [Fluconazole In Dextrose] Hives  ? Iodinated Contrast Media   ? Iodine-131 Other (See Comments)  ?  update  ? ? ? ?HOME MEDICATIONS: ?Outpatient Medications Prior to Visit  ?Medication Sig Dispense Refill  ? amitriptyline (ELAVIL) 50 MG tablet Take  1 tablet (50 mg total) by mouth daily. 90 tablet 0  ? atorvastatin (LIPITOR) 80 MG tablet TAKE 1 TABLET BY MOUTH EVERY DAY 90 tablet 0  ? cyclobenzaprine (FLEXERIL) 10 MG tablet Take 1 tablet (10 mg total) by

## 2021-07-06 MED ORDER — OXYCODONE HCL 10 MG PO TABS: 10.0000 mg | ORAL_TABLET | Freq: Two times a day (BID) | ORAL | 0 refills | Status: DC | PRN

## 2021-07-09 ENCOUNTER — Ambulatory Visit (INDEPENDENT_AMBULATORY_CARE_PROVIDER_SITE_OTHER): Payer: 59 | Admitting: Family Medicine

## 2021-07-09 ENCOUNTER — Encounter: Payer: Self-pay | Admitting: Family Medicine

## 2021-07-09 DIAGNOSIS — G43709 Chronic migraine without aura, not intractable, without status migrainosus: Secondary | ICD-10-CM | POA: Diagnosis not present

## 2021-07-09 DIAGNOSIS — G932 Benign intracranial hypertension: Secondary | ICD-10-CM | POA: Diagnosis not present

## 2021-07-09 MED ORDER — ACETAZOLAMIDE ER 500 MG PO CP12: 500.0000 mg | ORAL_CAPSULE | Freq: Two times a day (BID) | ORAL | 1 refills | Status: DC

## 2021-07-10 ENCOUNTER — Telehealth: Payer: Self-pay | Admitting: Family Medicine

## 2021-07-10 ENCOUNTER — Other Ambulatory Visit: Payer: Self-pay | Admitting: Family Medicine

## 2021-07-10 MED ORDER — AJOVY 225 MG/1.5ML ~~LOC~~ SOAJ: 225.0000 mg | SUBCUTANEOUS | 3 refills | Status: DC

## 2021-07-10 NOTE — Addendum Note (Signed)
Addended by: Darleen Crocker on: 07/10/2021 05:06 PM ? ? Modules accepted: Orders ? ?

## 2021-07-10 NOTE — Telephone Encounter (Signed)
Pt is requesting a refill for Fremanezumab-vfrm (AJOVY) 225 MG/1.5ML SOAJ. ? ?[id number]? ?

## 2021-07-10 NOTE — Telephone Encounter (Signed)
Order sent for the pt.  ?

## 2021-07-11 ENCOUNTER — Telehealth: Payer: Self-pay | Admitting: Family Medicine

## 2021-07-11 ENCOUNTER — Telehealth: Payer: Self-pay

## 2021-07-11 NOTE — Telephone Encounter (Signed)
Referral for Medical Weight Management sent to [facility] [place]. ?

## 2021-07-11 NOTE — Telephone Encounter (Signed)
PA initiated. Awaiting determination  ?

## 2021-07-11 NOTE — Telephone Encounter (Signed)
Submitted PA on CMM.  ?Key: [id number].  ?Waiting on determination from [employer] 2017.  ?

## 2021-07-31 ENCOUNTER — Other Ambulatory Visit: Payer: Self-pay | Admitting: Family Medicine

## 2021-07-31 DIAGNOSIS — E1165 Type 2 diabetes mellitus with hyperglycemia: Secondary | ICD-10-CM

## 2021-08-08 ENCOUNTER — Encounter: Payer: Self-pay | Admitting: Family Medicine

## 2021-08-09 ENCOUNTER — Other Ambulatory Visit: Payer: Self-pay | Admitting: *Deleted

## 2021-08-09 ENCOUNTER — Ambulatory Visit: Payer: 59

## 2021-08-09 DIAGNOSIS — R0683 Snoring: Secondary | ICD-10-CM

## 2021-08-09 DIAGNOSIS — G4733 Obstructive sleep apnea (adult) (pediatric): Secondary | ICD-10-CM

## 2021-08-09 DIAGNOSIS — G932 Benign intracranial hypertension: Secondary | ICD-10-CM

## 2021-08-09 NOTE — Telephone Encounter (Signed)
Please call and inform patient that Tinnitus is known side effect of Diamox. If this is bothersome to patient, she can discussed with Amy to try a different medication like Topiramate.  Thanks

## 2021-08-10 LAB — COMPREHENSIVE METABOLIC PANEL
ALT: 16 IU/L (ref 0–32)
AST: 13 IU/L (ref 0–40)
Albumin/Globulin Ratio: 1.2 (ref 1.2–2.2)
Albumin: 4.1 g/dL (ref 3.8–4.8)
Alkaline Phosphatase: 70 IU/L (ref 44–121)
BUN/Creatinine Ratio: 19 (ref 9–23)
BUN: 30 mg/dL — ABNORMAL HIGH (ref 6–24)
Bilirubin Total: 0.4 mg/dL (ref 0.0–1.2)
CO2: 16 mmol/L — ABNORMAL LOW (ref 20–29)
Calcium: 9.2 mg/dL (ref 8.7–10.2)
Chloride: 110 mmol/L — ABNORMAL HIGH (ref 96–106)
Creatinine, Ser: 1.62 mg/dL — ABNORMAL HIGH (ref 0.57–1.00)
Globulin, Total: 3.4 g/dL (ref 1.5–4.5)
Glucose: 131 mg/dL — ABNORMAL HIGH (ref 70–99)
Potassium: 4.3 mmol/L (ref 3.5–5.2)
Sodium: 140 mmol/L (ref 134–144)
Total Protein: 7.5 g/dL (ref 6.0–8.5)
eGFR: 40 mL/min/{1.73_m2} — ABNORMAL LOW (ref >59)

## 2021-08-14 ENCOUNTER — Telehealth: Payer: Self-pay | Admitting: Neurology

## 2021-08-14 ENCOUNTER — Encounter: Payer: Self-pay | Admitting: Neurology

## 2021-08-14 ENCOUNTER — Telehealth (INDEPENDENT_AMBULATORY_CARE_PROVIDER_SITE_OTHER): Payer: 59 | Admitting: Neurology

## 2021-08-14 DIAGNOSIS — G932 Benign intracranial hypertension: Secondary | ICD-10-CM

## 2021-08-14 MED ORDER — ACETAZOLAMIDE 125 MG PO TABS: ORAL_TABLET | ORAL | 6 refills | Status: DC

## 2021-08-14 NOTE — Telephone Encounter (Signed)
Guy Begin can you please schedule a phone f/u with patient with me in 4 weeks please? thanks

## 2021-08-14 NOTE — Telephone Encounter (Signed)
Called pt and scheduled her for telephone visit on 09/06/21 at 8:30 am.

## 2021-08-14 NOTE — Progress Notes (Signed)
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  Location: Patient: Home Provider: office   I discussed the limitations of evaluation and management by telemedicine and the availability of in person appointments. The patient expressed understanding and agreed to proceed.  Follow Up Instructions:    I discussed the assessment and treatment plan with the patient. The patient was provided an opportunity to ask questions and all were answered. The patient agreed with the plan and demonstrated an understanding of the instructions.   The patient was advised to call back or seek an in-person evaluation if the symptoms worsen or if the condition fails to improve as anticipated.  I provided 30 minutes of non-face-to-face time during this encounter.   Melvenia Beam, MD   08/14/2021: Patient with migraines and IDIOPATHIC INTRACRANIAL HYPERTENSION. Recent exam showed recurrence of papilledema. She was continues on Ajovy and botox and restarted on diamox. She has had a lot of ringing in her ears, ringing in the ears. The pressure headaches started and she saw [facility] in [place] and a neuroopthalmologist and she is seen in July next by the neuroophthalmologist. Since starting the diamox ringing in the ears. She is having some episodes of blurry vision. Migraines are much better but the pressure headaches are getting her. She has lost another 20 pounds since being diagnosed with alpha gal. Unclear why the papilledema worsened since she has been losing weight. No new events, she was placed on trazodone for sleep but that would not cause the papilledema/increased pressure. She has a shunt in place, last shunt series was in  2019 and was intact, last seen by neurosurgery in 2021 for reprogramming. Patient will call neurosurgery at [facility] may need reprogramming of shunt, LP and shunt series again.   Patient complains of symptoms per HPI as well as the following symptoms: pressure headaches . Pertinent negatives and positives per HPI. All others negative   07/09/2021 ALL:  Vanessa [name] returns for follow up following initiation of Botox for migraines. She continues Ajovy and Nurtec. Last procedure 06/25/2021. She feels that migraines are much better. She has about 4-5 migraine days a month, previously reporting daily headaches with 8-12 migraines. Nurtec works well for abortive therapy.   She reports more pressure headaches over the past few months. She feels that her head is going to explode at times. She reports blurred vision at times (with and without headache). No vision loss or TVO. She reports eye exam last month showed worsening optic nerve edema. She has follow up in 10/2021. BP has been better, usually between 135-145/80's. She is current on furosemide '40mg'$  daily and spironolactone '100mg'$  BID for peripheral edema. Also taking metoprolol daily.   She was diagnosed with alpha gal. She has eliminated milk and red meats from her diet. She is eating mostly chicken, [place] and fish. She reports losing 10lbs. She has never worked with medical weight management. She lives in [place], [place]. A1C improved from 10.2 to 8.2 over past 6 months.   11/30/2021 ALL:  Vanessa [name] returns for follow up for migraines. She was switched from [facility] to [facility] at last visit 04/2020. Amitriptyline continued per PCP and Nurtec for abortive therapy. She feels that she may be doing a little better on  Vernell Barrier works well for abortive therapy. No vision changes or vision loss, some blurred vision with headache. She is scheduled for eye exam next week. She reports that BP has been elevated at home (145/80-90). She relates this to being nervous about a recent  biopsy with GYN. She found out yesterday everything was fine with biopsy. She has a hard time sleeping, since childhood. She is on zaleplon. She feels that she just lies awake at night. Usually only gets 4 hours of sleep each night. She reports CBGs are stable. Last A1C 7.0. She feels mood is good on fluoxetine.   From a thorough review of notes patient's been on multiple medications that can be used in iih and migraine management including acetazolamide (made her feels really bad), topiramate (made headaches worse), propranolol (can't remember but reports side effects), amitriptyline (on now), amlodipine, Benadryl, gabapentin, lisinopril, Zofran, promethazine, naratriptan, maxalt, sumatriptan  05/25/2020 ALL:  Vanessa [name] is a 46 y.o. female here today for follow up for headache follow up. 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 MRI 11/2019 was unremarkable. She was started on Emgality (Ajovy not approved).  Diamox and amitriptyline continued (prescribed by PCP). She reports that around 11/2019 she stopped Diamox due to reports of nausea. She was doing very well from 11/2019 until 04/2020. Over the past month she has noticed worsening headaches. She is having near daily dull, achy headaches in the frontal region. She is having 1-2 migraines a week. She reports smelling diesel fuel before during and after headache. She has nausea and blurred vision with headache. No vision loss in left eye. Blind in right eye. Nurtec helps most with abortive therapy.  A1C at goal. BP has been elevated. She feels that this is due to headaches. She is taking Oxycodone '10mg'$  PRN (1-2 times daily) for knee pain. She has constipation. She reports neuro opthalmology exam was normal with no concerns of optic nerve edema about 2 months ago.   She was seen 3/25 in the ER for classic migraine aborted with migraine cocktail and Fioricet. CT was normal, stable  position of shunt and decompressed ventricles.    HISTORY (copied from Dr Cathren Laine previous note)  HPI:  Vanessa [name] is a 46 y.o. female here as requested by Loman Brooklyn, FNP for migraines.  Patient has a past medical history of IDIOPATHIC INTRACRANIAL HYPERTENSION,  congenital blindness OD, migraines, morbid obesity, hypertension, vision loss, PCOS, diabetes, hyperlipidemia, congenital blindness of right eye, OD glaucoma. 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  She has been evaluated in the past by Dr. Jola Schmidt) as well as Dr. Octavia Bruckner Martin(neuro-Ophthalmology) and now she sees a neuro- ophthalmologist elsewhere.   Last time she saw neurosurgery was in 2019 and at that time the shunt was in  good working condition. Headaches improved. She is still getting migraines however. Some days it feels like head is going to explode. She has not lost weight. She has daily headaches and migraines 2x a week. She wakes up with headaches, not using her cpap. Positional worsening headache, vision changes and blurry vision.The migraines are throbbing, she may smell finny things prior to migraine. Migraines can last 3 days, severe, at least 15 migraine days a month for over a year, light/sound  sensitivity, nausea, no vomiting. Daily headaches. Congenital blindness right eye. Headaches are worse in the morning and with position. No weakness, numbness. No other focal neurologic deficits, associated symptoms, inciting events or modifiable factors.  Last seen by neurosurgery in April 2019.   Reviewed notes, labs and imaging from outside physicians, which showed:   Reviewed imaging from January 2019 CT of the head, interval placement of a left frontal approach VP shunt catheter, slitlike appearance of the lateral and third ventricles.  Evidence of a prior right frontal burr hole site.  XR shunt series showed left VP shunt catheter tubing coursing along the left hemithorax and into the abdomen and terminates into the left lower quadrant, no shunt kinking or discontinuity.    From a thorough review of notes patient's been on multiple medications that can be used in iih and migraine management including acetazolamide, propranolol, amitriptyline, amlodipine, Benadryl, gabapentin, lisinopril, Zofran, promethazine,topamax, naratriptan, maxalt, sumatriptan    REVIEW OF SYSTEMS: Out of a complete 14 system review of symptoms, the patient complains only of the following symptoms, headaches, chronic knee pain, blurred vision, and all other reviewed systems are negative.   ALLERGIES: Allergies  Allergen Reactions   Metrizamide Other (See Comments), Itching, Photosensitivity, Shortness Of Breath and Swelling    update Migraine instantly   Mushroom Extract Complex Itching    Throat itching with cough   Peanut Oil Anaphylaxis and Other (See Comments)    update   Peanut-Containing Drug Products Itching    Itching throat with cough Itching throat with cough   Diflucan In Dextrose [Fluconazole In Dextrose] Hives   Iodinated Contrast Media    Iodine-131 Other (See Comments)    update     HOME MEDICATIONS: Outpatient Medications Prior to Visit  Medication Sig Dispense Refill   AJOVY 225  MG/1.5ML SOSY INJECT 225 MG INTO THE SKIN EVERY 30 (THIRTY) DAYS. NOT COVERED 1.5 mL 11   amitriptyline (ELAVIL) 50 MG tablet Take 1 tablet (50 mg total) by mouth daily. 90 tablet 0   atorvastatin (LIPITOR) 80 MG tablet TAKE 1 TABLET BY MOUTH EVERY DAY 90 tablet 0   cyclobenzaprine (FLEXERIL) 10 MG tablet Take 1 tablet (10 mg total) by mouth 3 (three) times daily as needed for muscle spasms. 60 tablet 2   Dulaglutide (TRULICITY) 4.5 GM/0.1UU SOPN Inject 4.5 mg as directed once a week. 2 mL 0   empagliflozin (JARDIANCE) 25 MG TABS tablet Take 1 tablet (25 mg total) by mouth daily before breakfast. 90 tablet 0   EPINEPHrine (EPIPEN 2-PAK) 0.3 mg/0.3 mL IJ SOAJ injection Inject 0.3 mg into the muscle as needed for anaphylaxis. 2 each 1   flunisolide (NASALIDE) 25 MCG/ACT (0.025%) SOLN Instill 2 sprays into each nostril twice daily. 25 mL 0   FLUoxetine (PROZAC) 20 MG capsule Take 1 capsule by mouth every evening. 30 capsule 0   furosemide (LASIX) 40 MG tablet TAKE 1 TABLET BY MOUTH EVERY DAY 90 tablet 0   glucose blood test strip Use to test sugar  daily. Dx E11.65 100 each 2   halobetasol (ULTRAVATE) 0.05 % cream Apply topically 2 (two) times daily. 50 g 5   lisinopril (ZESTRIL) 40 MG tablet Take 1 tablet (40 mg total) by mouth daily. 90 tablet 1   loratadine (CLARITIN) 10 MG tablet Take 1 tablet by mouth daily. 90 tablet 1   megestrol (MEGACE) 40 MG tablet TAKE BY MOUTH 3 TABLETS A DAY FOR 5 DAYS, THEN 2 TABLETS A DAY FOR 5 DAYS, THEN 1 TABLET DAILY 135 tablet 1   metoprolol succinate (TOPROL-XL) 25 MG 24 hr tablet TAKE 1 TABLET (25 MG TOTAL) BY MOUTH DAILY. 90 tablet 0   montelukast (SINGULAIR) 10 MG tablet Take 1 tablet by mouth daily at bedtime. 90 tablet 0   Multiple Vitamin (MULTIVITAMIN) tablet Take 1 tablet by mouth daily.     naproxen (NAPROSYN) 500 MG tablet Take 500 mg by mouth 2 (two) times daily.     neomycin-polymyxin-hydrocortisone (CORTISPORIN) OTIC solution Place 3 drops into both  ears 4 (four) times daily. 10 mL 0   OneTouch Delica Lancets 27O MISC 1 each by Does not apply route daily. Dx E11.9 100 each 3   Oxycodone HCl 10 MG TABS Take 1 tablet (10 mg total) by mouth 2 (two) times daily as needed. 60 tablet 0   Oxycodone HCl 10 MG TABS Take 1 tablet (10 mg total) by mouth 2 (two) times daily as needed. 60 tablet 0   Oxycodone HCl 10 MG TABS Take 1 tablet (10 mg total) by mouth 2 (two) times daily as needed. 60 tablet 0   Rimegepant Sulfate (NURTEC) 75 MG TBDP Take 75 mg by mouth daily as needed. Onset migraine (max one daily) 10 tablet 5   spironolactone (ALDACTONE) 100 MG tablet Take 1 tablet (100 mg total) by mouth 2 (two) times daily. 180 tablet 1   TART CHERRY PO Take by mouth.     traZODone (DESYREL) 50 MG tablet TAKE 1-2 TABLETS BY MOUTH AT BEDTIME AS NEEDED FOR SLEEP. 180 tablet 1   acetaZOLAMIDE ER (DIAMOX) 500 MG capsule Take 1 capsule (500 mg total) by mouth 2 (two) times daily. 180 capsule 1   No facility-administered medications prior to visit.     PAST MEDICAL HISTORY: 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   Hypertension    Migraines    Pseudotumor (inflammatory) of orbit    Sepsis ([place]) 02/2019   Sleep apnea    Vaginal Pap smear, abnormal    Vision loss      PAST SURGICAL HISTORY: Past Surgical History:  Procedure Laterality Date   BRAIN SURGERY     CSF SHUNT       FAMILY HISTORY: Family History  Problem Relation Age of Onset   Pulmonary Hypertension Mother    Congestive Heart Failure Mother    Hypertension Sister    Obesity Sister    Migraines Neg Hx      SOCIAL HISTORY: Social History   Socioeconomic History   Marital status: Single    Spouse name: Not on file   Number of children: 0   Years of education: Not on file   Highest education level: Not on file  Occupational History   Occupation: disability  Tobacco Use   Smoking status: Never   Smokeless tobacco: Never   Vaping Use   Vaping Use: Never used  Substance and Sexual Activity   Alcohol use: No   Drug  use: No   Sexual activity: Not Currently    Birth control/protection: Condom  Other Topics Concern   Not on file  Social History Narrative   Lives with parents   Right handed   Caffeine: maybe 2 cups/day   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   Social Determinants of Health   Financial Resource Strain: Low Risk  (01/24/2021)   Overall Financial Resource Strain (CARDIA)    Difficulty of Paying Living Expenses: Not very hard  Food Insecurity: Food Insecurity Present (01/24/2021)   Hunger Vital Sign    Worried About Running Out of Food in the Last Year: Sometimes true    Ran Out of Food in the Last Year: Sometimes true  Transportation Needs: No Transportation Needs (01/24/2021)   PRAPARE - Hydrologist (Medical): No    Lack of Transportation (Non-Medical): No  Physical Activity: Insufficiently Active (01/24/2021)   Exercise Vital Sign    Days of Exercise per Week: 7 days    Minutes of Exercise per Session: 20 min  Stress: No Stress Concern Present (01/24/2021)   [place]    Feeling of Stress : Only a little  Social Connections: Moderately Isolated (01/24/2021)   Social Connection and Isolation Panel [NHANES]    Frequency of Communication with Friends and Family: More than three times a week    Frequency of Social Gatherings with Friends and Family: More than three times a week    Attends Religious Services: 1 to 4 times per year    Active Member of [employer] or Organizations: No    Attends Archivist Meetings: Never    Marital Status: Never married  Intimate Partner Violence: Not At Risk (01/24/2021)   Humiliation, Afraid, Rape, and Kick questionnaire    Fear of Current or Ex-Partner: No    Emotionally Abused: No    Physically Abused: No    Sexually Abused: No       Physical  exam: Exam: Gen: NAD, conversant      CV:  Denies palpitations or chest pain or SOB. VS: Breathing at a normal rate. Weight appears obese. Not febrile. Eyes: Conjunctivae clear without exudates or hemorrhage  Neuro: Detailed Neurologic Exam  Speech:    Speech is normal; fluent and spontaneous with normal comprehension.  Cognition:    The patient is oriented to person, place, and time;     recent and remote memory intact;     language fluent;     normal attention, concentration,     fund of knowledge Cranial Nerves:    The pupils are equal, round, and reactive to light. Attempted, Cannot perform fundoscopic exam. Visual fields are full to finger confrontation. Extraocular movements are intact.  The face is symmetric with normal sensation. The palate elevates in the midline. Hearing intact. Voice is normal. Shoulder shrug is normal. The tongue has normal motion without fasciculations.   Motor Observation:   no involuntary movements noted. Tone:    Appears normal  Posture:    Posture is normal. normal erect    Strength:    Strength is anti-gravity and symmetric in the upper limbs.      Sensation: intact to LT        DIAGNOSTIC DATA (LABS, IMAGING, TESTING) - I reviewed patient records, labs, notes, testing and imaging myself where available.  Lab Results  Component Value Date   WBC 7.1 01/04/2021   HGB 9.9 (L) 01/04/2021   HCT  31.1 (L) 01/04/2021   MCV 77 (L) 01/04/2021   PLT 211 01/04/2021      Component Value Date/Time   NA 140 08/09/2021 1157   K 4.3 08/09/2021 1157   CL 110 (H) 08/09/2021 1157   CO2 16 (L) 08/09/2021 1157   GLUCOSE 131 (H) 08/09/2021 1157   BUN 30 (H) 08/09/2021 1157   CREATININE 1.62 (H) 08/09/2021 1157   CALCIUM 9.2 08/09/2021 1157   PROT 7.5 08/09/2021 1157   ALBUMIN 4.1 08/09/2021 1157   AST 13 08/09/2021 1157   ALT 16 08/09/2021 1157   ALKPHOS 70 08/09/2021 1157   BILITOT 0.4 08/09/2021 1157   GFRNONAA 79 04/06/2020 1329   GFRAA  91 04/06/2020 1329   Lab Results  Component Value Date   CHOL 154 05/17/2021   HDL 31 (L) 05/17/2021   LDLCALC 101 (H) 05/17/2021   TRIG 121 05/17/2021   CHOLHDL 5.0 (H) 05/17/2021   Lab Results  Component Value Date   HGBA1C 8.2 (H) 05/17/2021   No results found for: "VITAMINB12" No results found for: "TSH"      No data to display               No data to display           ASSESSMENT AND PLAN  46 y.o. year old female  has a past medical history of Allergy, Allergy to alpha-gal (05/21/2021), Hypertension, Migraines, Pseudotumor (inflammatory) of orbit, Sepsis ([place]) (02/2019), Sleep apnea, Vaginal Pap smear, abnormal, and Vision loss. here with   IIH (idiopathic intracranial hypertension) - Plan: acetaZOLAMIDE (DIAMOX) 125 MG tablet, Ambulatory referral to Neurosurgery  - New pressure headaches and papilledema in IDIOPATHIC INTRACRANIAL HYPERTENSION patient with a shunt despite weight loss. Has shunt managed by [facility] neurosugery, last saw Alecia Lemming and Dr. [name] placed the shunt. Now with papilledema again, She has a shunt in place, last shunt series was in 2019 and was intact, last seen by neurosurgery in 2021 for reprogramming. Needs to be [facility] at [facility] may need reprogramming of shunt, LP and shunt series again. Will start Diamox low dose '125mg'$  bid and can increase to '250mg'$  bid in 2 weeks if no side effects and as tolerated until she sees neurosurgery at [facility].  - Tayah is doing much better from a migraine perspective. Previously having daily headaches with at least 8-12 migrainous days. Now reports less than 4-5 migraine days per month. She reports pressure headaches are worsening. We have discussed potential contributors including worsening IIH, BP elevation, inconsistent use of CPAP, weather changes, stress, etc. Eye exam was reportedly normal in 02/2020 but showed worsening optic nerve edema 05/2021. She is on Lasix and spironolactone. She did not feel well on  Diamox or topiramate but wishes to restart Diamox. I will monitor labs closely. Last labs show potassium 135 and creatinine 1.12 in 04/2021. Crcl 87. I will decrease Diamox (had ringing in ears on '500mg'$  BID)  I will recheck labs through PCP office in 2 weeks. She will continue Botox and Ajovy for prevention. Amitriptyline per PCP recommendation. We will continue Nurtec for abortive therapy. Healthy lifestyle habits encouraged. She will monitor BP closely. She will follow up in 6 months for office visit, in 08/2021 for Botox. She verbalizes understanding and agreement with this plan.   - phone call in 4 weeks (she lives an hour away in [place], would prefer office visit but difficult for her to et here and she has ophthalmologist and neuro-ophthalmologist follow up  near her)  Orders Placed This Encounter  Procedures   Ambulatory referral to Neurosurgery    Referral Priority:   Routine    Referral Type:   Surgical    Referral Reason:   Specialty Services Required    Referred to Provider:   Caryl Comes, MD    Requested Specialty:   Neurosurgery    Number of Visits Requested:   1      Meds ordered this encounter  Medications   acetaZOLAMIDE (DIAMOX) 125 MG tablet    Sig: Start with one pill twice a day. In 2 weeks if improving and no side effects stay on this dose. If not improving increase to 2 tabs ('250mg'$ ) twice daily and monitor for side effects.    Dispense:  60 tablet    Refill:  [place] MD  [facility] [address] [place], [place] [address] [phone]

## 2021-08-15 ENCOUNTER — Telehealth: Payer: Self-pay | Admitting: Neurology

## 2021-08-15 ENCOUNTER — Other Ambulatory Visit: Payer: Self-pay | Admitting: Family Medicine

## 2021-08-15 DIAGNOSIS — G43709 Chronic migraine without aura, not intractable, without status migrainosus: Secondary | ICD-10-CM

## 2021-08-15 NOTE — Telephone Encounter (Signed)
Referral for Neurosurgery sent to [facility] [phone].

## 2021-08-17 ENCOUNTER — Telehealth: Payer: Self-pay | Admitting: Pulmonary Disease

## 2021-08-17 DIAGNOSIS — G4733 Obstructive sleep apnea (adult) (pediatric): Secondary | ICD-10-CM

## 2021-08-17 NOTE — Telephone Encounter (Signed)
Spoke with patient regarding hst results. They verbalized understanding. No further questions.  Nothing further needed at this time. Appt reminder mailed to patient, scheduled for 8/10 in [facility] office

## 2021-08-21 ENCOUNTER — Encounter: Payer: Self-pay | Admitting: Family Medicine

## 2021-08-21 ENCOUNTER — Ambulatory Visit (INDEPENDENT_AMBULATORY_CARE_PROVIDER_SITE_OTHER): Payer: 59 | Admitting: Family Medicine

## 2021-08-21 VITALS — BP 120/69 | HR 84 | Temp 98.3°F | Ht 70.0 in | Wt 318.0 lb

## 2021-08-21 DIAGNOSIS — Z6841 Body Mass Index (BMI) 40.0 and over, adult: Secondary | ICD-10-CM

## 2021-08-21 DIAGNOSIS — Z0000 Encounter for general adult medical examination without abnormal findings: Secondary | ICD-10-CM

## 2021-08-21 DIAGNOSIS — I10 Essential (primary) hypertension: Secondary | ICD-10-CM | POA: Diagnosis not present

## 2021-08-21 DIAGNOSIS — E1165 Type 2 diabetes mellitus with hyperglycemia: Secondary | ICD-10-CM | POA: Diagnosis not present

## 2021-08-21 DIAGNOSIS — M533 Sacrococcygeal disorders, not elsewhere classified: Secondary | ICD-10-CM

## 2021-08-21 DIAGNOSIS — Z91018 Allergy to other foods: Secondary | ICD-10-CM

## 2021-08-21 DIAGNOSIS — M65332 Trigger finger, left middle finger: Secondary | ICD-10-CM

## 2021-08-21 DIAGNOSIS — Z79899 Other long term (current) drug therapy: Secondary | ICD-10-CM

## 2021-08-21 DIAGNOSIS — J302 Other seasonal allergic rhinitis: Secondary | ICD-10-CM

## 2021-08-21 DIAGNOSIS — M542 Cervicalgia: Secondary | ICD-10-CM

## 2021-08-21 DIAGNOSIS — E785 Hyperlipidemia, unspecified: Secondary | ICD-10-CM

## 2021-08-21 DIAGNOSIS — E1169 Type 2 diabetes mellitus with other specified complication: Secondary | ICD-10-CM | POA: Diagnosis not present

## 2021-08-21 DIAGNOSIS — F5101 Primary insomnia: Secondary | ICD-10-CM

## 2021-08-21 DIAGNOSIS — M170 Bilateral primary osteoarthritis of knee: Secondary | ICD-10-CM

## 2021-08-21 LAB — LIPID PANEL
Chol/HDL Ratio: 4.8 ratio — ABNORMAL HIGH (ref 0.0–4.4)
Cholesterol, Total: 138 mg/dL (ref 100–199)
HDL: 29 mg/dL — ABNORMAL LOW (ref >39)
LDL Chol Calc (NIH): 92 mg/dL (ref 0–99)
Triglycerides: 91 mg/dL (ref 0–149)
VLDL Cholesterol Cal: 17 mg/dL (ref 5–40)

## 2021-08-21 LAB — CBC WITH DIFFERENTIAL/PLATELET
Basophils Absolute: 0 10*3/uL (ref 0.0–0.2)
Basos: 0 %
EOS (ABSOLUTE): 0.2 10*3/uL (ref 0.0–0.4)
Eos: 2 %
Hematocrit: 35.1 % (ref 34.0–46.6)
Hemoglobin: 10.8 g/dL — ABNORMAL LOW (ref 11.1–15.9)
Immature Grans (Abs): 0 10*3/uL (ref 0.0–0.1)
Immature Granulocytes: 0 %
Lymphocytes Absolute: 2.3 10*3/uL (ref 0.7–3.1)
Lymphs: 30 %
MCH: 24.5 pg — ABNORMAL LOW (ref 26.6–33.0)
MCHC: 30.8 g/dL — ABNORMAL LOW (ref 31.5–35.7)
MCV: 80 fL (ref 79–97)
Monocytes Absolute: 0.6 10*3/uL (ref 0.1–0.9)
Monocytes: 8 %
Neutrophils Absolute: 4.6 10*3/uL (ref 1.4–7.0)
Neutrophils: 60 %
Platelets: 199 10*3/uL (ref 150–450)
RBC: 4.4 x10E6/uL (ref 3.77–5.28)
RDW: 17.7 % — ABNORMAL HIGH (ref 11.7–15.4)
WBC: 7.6 10*3/uL (ref 3.4–10.8)

## 2021-08-21 LAB — CMP14+EGFR
ALT: 19 IU/L (ref 0–32)
AST: 13 IU/L (ref 0–40)
Albumin/Globulin Ratio: 1.3 (ref 1.2–2.2)
Albumin: 3.9 g/dL (ref 3.8–4.8)
Alkaline Phosphatase: 58 IU/L (ref 44–121)
BUN/Creatinine Ratio: 13 (ref 9–23)
BUN: 15 mg/dL (ref 6–24)
Bilirubin Total: 0.5 mg/dL (ref 0.0–1.2)
CO2: 16 mmol/L — ABNORMAL LOW (ref 20–29)
Calcium: 9.2 mg/dL (ref 8.7–10.2)
Chloride: 107 mmol/L — ABNORMAL HIGH (ref 96–106)
Creatinine, Ser: 1.16 mg/dL — ABNORMAL HIGH (ref 0.57–1.00)
Globulin, Total: 3.1 g/dL (ref 1.5–4.5)
Glucose: 109 mg/dL — ABNORMAL HIGH (ref 70–99)
Potassium: 4.1 mmol/L (ref 3.5–5.2)
Sodium: 137 mmol/L (ref 134–144)
Total Protein: 7 g/dL (ref 6.0–8.5)
eGFR: 59 mL/min/{1.73_m2} — ABNORMAL LOW (ref >59)

## 2021-08-21 LAB — BAYER DCA HB A1C WAIVED: HB A1C (BAYER DCA - WAIVED): 7.3 % — ABNORMAL HIGH (ref 4.8–5.6)

## 2021-08-21 MED ORDER — OXYCODONE HCL 10 MG PO TABS: 10.0000 mg | ORAL_TABLET | Freq: Two times a day (BID) | ORAL | 0 refills | Status: AC | PRN

## 2021-08-21 MED ORDER — TRAZODONE HCL 50 MG PO TABS: 50.0000 mg | ORAL_TABLET | Freq: Every evening | ORAL | 1 refills | Status: DC | PRN

## 2021-08-21 MED ORDER — CYCLOBENZAPRINE HCL 10 MG PO TABS
10.0000 mg | ORAL_TABLET | Freq: Three times a day (TID) | ORAL | 2 refills | Status: DC | PRN
Start: 2021-08-21 — End: 2021-11-05

## 2021-08-21 MED ORDER — NAPROXEN 500 MG PO TABS: 500.0000 mg | ORAL_TABLET | Freq: Two times a day (BID) | ORAL | 2 refills | Status: DC

## 2021-08-21 MED ORDER — MONTELUKAST SODIUM 10 MG PO TABS: ORAL_TABLET | ORAL | 1 refills | Status: DC

## 2021-08-21 MED ORDER — EMPAGLIFLOZIN 25 MG PO TABS: 25.0000 mg | ORAL_TABLET | Freq: Every day | ORAL | 1 refills | Status: DC

## 2021-08-21 MED ORDER — SEMAGLUTIDE (2 MG/DOSE) 8 MG/3ML ~~LOC~~ SOPN: 2.0000 mg | PEN_INJECTOR | SUBCUTANEOUS | 1 refills | Status: DC

## 2021-08-23 ENCOUNTER — Ambulatory Visit: Payer: 59 | Admitting: Nutrition

## 2021-08-23 ENCOUNTER — Other Ambulatory Visit: Payer: Self-pay | Admitting: Family Medicine

## 2021-08-23 DIAGNOSIS — F5101 Primary insomnia: Secondary | ICD-10-CM

## 2021-08-24 LAB — TOXASSURE SELECT 13 (MW), URINE

## 2021-09-06 ENCOUNTER — Other Ambulatory Visit: Payer: Self-pay | Admitting: Obstetrics & Gynecology

## 2021-09-06 ENCOUNTER — Telehealth: Payer: 59 | Admitting: Neurology

## 2021-09-07 ENCOUNTER — Other Ambulatory Visit: Payer: Self-pay | Admitting: Family Medicine

## 2021-09-07 DIAGNOSIS — J302 Other seasonal allergic rhinitis: Secondary | ICD-10-CM

## 2021-09-10 ENCOUNTER — Other Ambulatory Visit: Payer: Self-pay | Admitting: *Deleted

## 2021-09-10 DIAGNOSIS — J302 Other seasonal allergic rhinitis: Secondary | ICD-10-CM

## 2021-09-10 MED ORDER — MONTELUKAST SODIUM 10 MG PO TABS: ORAL_TABLET | ORAL | 1 refills | Status: DC

## 2021-09-19 ENCOUNTER — Ambulatory Visit (INDEPENDENT_AMBULATORY_CARE_PROVIDER_SITE_OTHER): Payer: 59 | Admitting: Family Medicine

## 2021-09-19 DIAGNOSIS — G43709 Chronic migraine without aura, not intractable, without status migrainosus: Secondary | ICD-10-CM

## 2021-09-19 MED ORDER — ONABOTULINUMTOXINA 200 UNITS IJ SOLR
155.0000 [IU] | Freq: Once | INTRAMUSCULAR | Status: AC
  Administered 2021-09-19: 155 [IU] via INTRAMUSCULAR

## 2021-09-19 MED ORDER — ONABOTULINUMTOXINA 200 UNITS IJ SOLR: 155.0000 [IU] | Freq: Once | INTRAMUSCULAR | Status: DC

## 2021-09-19 NOTE — Progress Notes (Signed)
Botox- 200 units x 1 vial Lot: [id number] Expiration: 03/2024 NDC: 6063-0160-10  Bacteriostatic 0.9% Sodium Chloride- 93m total Lot: [id number]: 10/27/2022 NDC: [address]-27 Dx: [id number]/P

## 2021-09-19 NOTE — Progress Notes (Signed)
09/19/2021 ALL: Vanessa [name] returns for Botox. Migraines remain well managed on Ajovy and Nurtec. 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 She was seen by Dr Jaynee Eagles 07/2021 and dose reduced to '125mg'$  BID with plans to increase to '250mg'$  BID. Referral to [facility] for shunt eval. CT/shunt series show shunt intact, valve pressure at 140. 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 She is schedule to return for venous angiogram with manometry next month. She is tolerating Diamox '125mg'$  BID. She has follow up with Jaynee Eagles next month.   06/25/2021 ALL: Vanessa [name] returns for 3rd procedure. She continues Ajovy and [place]. She is doing well. 4-5 migraines on average. Nurtec works well.   03/26/2021 ALL: Vanessa [name] returns for 2nd Botox procedure. She continues Ajovy and Nurtec.  She reports over 50% reduction in migraine frequency and intensity. Now having 3-4 migrainous headaches per month. Migraines are easily aborted with Nurtec.   01/01/2021 ALL: Vanessa [name] presents for her first Botox procedure. Baseline daily headaches with 8-12 migrainous headaches per month. She continues Ajovy for migraine prevention. Amitriptyline prescribed by PCP. Nurtec helps with abortive therapy.    Consent Form Botulism Toxin Injection For Chronic Migraine    Reviewed orally with patient, additionally signature is on file:  Botulism toxin has been approved by the Federal drug administration for treatment of chronic migraine. Botulism toxin does not cure chronic migraine and it may not be effective in some patients.  The administration of botulism toxin is accomplished by injecting a small amount of toxin into the muscles of the neck and head. Dosage must be titrated for each individual. Any benefits resulting from botulism toxin tend to wear off after 3 months with a repeat injection required if benefit is to be maintained. Injections are usually done every 3-4 months with maximum  effect peak achieved by about 2 or 3 weeks. Botulism toxin is expensive and you should be sure of what costs you will incur resulting from the injection.  The side effects of botulism toxin use for chronic migraine may include:   -Transient, and usually mild, facial weakness with facial injections  -Transient, and usually mild, head or neck weakness with head/neck injections  -Reduction or loss of forehead facial animation due to forehead muscle weakness  -Eyelid drooping  -Dry eye  -Pain at the site of injection or bruising at the site of injection  -Double vision  -Potential unknown long term risks   Contraindications: You should not have Botox if you are pregnant, nursing, allergic to albumin, have an infection, skin condition, or muscle weakness at the site of the injection, or have myasthenia gravis, Lambert-Eaton syndrome, or ALS.  It is also possible that as with any injection, there may be an allergic reaction or no effect from the medication. Reduced effectiveness after repeated injections is sometimes seen and rarely infection at the injection site may occur. All care will be taken to prevent these side effects. If therapy is given over a long time, atrophy and wasting in the muscle injected may occur. Occasionally the patient's become refractory to treatment because they develop antibodies to the toxin. In this event, therapy needs to be modified.  I have read the above information and consent to the administration of botulism toxin.    BOTOX PROCEDURE NOTE FOR MIGRAINE HEADACHE  Contraindications and precautions discussed with patient(above). Aseptic procedure was observed and patient tolerated procedure. Procedure performed by [name], FNP-C.   The condition  has existed for more than 6 months, and pt does not have a diagnosis of ALS, Myasthenia Gravis or Lambert-Eaton Syndrome.  Risks and benefits of injections discussed and pt agrees to proceed with the procedure.  Written  consent obtained  These injections are medically necessary. Pt  receives good benefits from these injections. These injections do not cause sedations or hallucinations which the oral therapies may cause.   Description of procedure:  The patient was placed in a sitting position. The standard protocol was used for Botox as follows, with 5 units of Botox injected at each site:  -Procerus muscle, midline injection  -Corrugator muscle, bilateral injection  -Frontalis muscle, bilateral injection, with 2 sites each side, medial injection was performed in the upper one third of the frontalis muscle, in the region vertical from the medial inferior edge of the superior orbital rim. The lateral injection was again in the upper one third of the forehead vertically above the lateral limbus of the cornea, 1.5 cm lateral to the medial injection site.  -Temporalis muscle injection, 4 sites, bilaterally. The first injection was 3 cm above the tragus of the ear, second injection site was 1.5 cm to 3 cm up from the first injection site in line with the tragus of the ear. The third injection site was 1.5-3 cm forward between the first 2 injection sites. The fourth injection site was 1.5 cm posterior to the second injection site. 5th site laterally in the temporalis  muscleat the level of the outer canthus.  -Occipitalis muscle injection, 3 sites, bilaterally. The first injection was done one half way between the occipital protuberance and the tip of the mastoid process behind the ear. The second injection site was done lateral and superior to the first, 1 fingerbreadth from the first injection. The third injection site was 1 fingerbreadth superiorly and medially from the first injection site.  -Cervical paraspinal muscle injection, 2 sites, bilaterally. The first injection site was 1 cm from the midline of the cervical spine, 3 cm inferior to the lower border of the occipital protuberance. The second injection site  was 1.5 cm superiorly and laterally to the first injection site.  -Trapezius muscle injection was performed at 3 sites, bilaterally. The first injection site was in the upper trapezius muscle halfway between the inflection point of the neck, and the acromion. The second injection site was one half way between the acromion and the first injection site. The third injection was done between the first injection site and the inflection point of the neck.   Will return for repeat injection in 3 months.   A total of 200 units of Botox was prepared, 155 units of Botox was injected as documented above, 45 units of Botox was wasted. The patient tolerated the procedure well, there were no complications of the above procedure.

## 2021-09-20 ENCOUNTER — Other Ambulatory Visit: Payer: Self-pay | Admitting: Family Medicine

## 2021-09-20 DIAGNOSIS — I10 Essential (primary) hypertension: Secondary | ICD-10-CM

## 2021-09-29 ENCOUNTER — Other Ambulatory Visit: Payer: Self-pay | Admitting: Family Medicine

## 2021-09-29 DIAGNOSIS — I10 Essential (primary) hypertension: Secondary | ICD-10-CM

## 2021-09-29 DIAGNOSIS — E1169 Type 2 diabetes mellitus with other specified complication: Secondary | ICD-10-CM

## 2021-09-29 DIAGNOSIS — E1165 Type 2 diabetes mellitus with hyperglycemia: Secondary | ICD-10-CM

## 2021-10-01 ENCOUNTER — Encounter: Payer: Self-pay | Admitting: *Deleted

## 2021-10-03 ENCOUNTER — Encounter (INDEPENDENT_AMBULATORY_CARE_PROVIDER_SITE_OTHER): Payer: Self-pay

## 2021-10-04 ENCOUNTER — Other Ambulatory Visit: Payer: Self-pay | Admitting: Family Medicine

## 2021-10-04 ENCOUNTER — Ambulatory Visit (INDEPENDENT_AMBULATORY_CARE_PROVIDER_SITE_OTHER): Payer: 59 | Admitting: Pulmonary Disease

## 2021-10-04 ENCOUNTER — Encounter: Payer: Self-pay | Admitting: Pulmonary Disease

## 2021-10-04 VITALS — BP 134/74 | HR 80 | Temp 97.8°F | Ht 70.0 in | Wt 328.4 lb

## 2021-10-04 DIAGNOSIS — G43709 Chronic migraine without aura, not intractable, without status migrainosus: Secondary | ICD-10-CM

## 2021-10-04 DIAGNOSIS — Z7189 Other specified counseling: Secondary | ICD-10-CM

## 2021-10-04 DIAGNOSIS — G4733 Obstructive sleep apnea (adult) (pediatric): Secondary | ICD-10-CM

## 2021-10-04 DIAGNOSIS — F5101 Primary insomnia: Secondary | ICD-10-CM

## 2021-10-04 NOTE — Patient Instructions (Signed)
Will arrange for auto CPAP set up through [facility]  Follow up in 3 to 4 months

## 2021-10-04 NOTE — Progress Notes (Signed)
[facility]  Chief Complaint  Patient presents with   Follow-up    Appt to follow up on OSA results     Past Surgical History:  She  has a past surgical history that includes CSF shunt and Brain surgery.  Past Medical History:  Allergies, HTN, Migraine headaches, Pseudotumor cerebri, Idiopathic intracranial hypertension, s/p cerebral ventricular shunt, Sepsis 2021, DM, Depression  Constitutional:  BP 134/74 (BP Location: Right Wrist)   Pulse 80   Temp 97.8 F (36.6 C) (Temporal)   Ht '5\' 10"'$  (1.778 m)   Wt (!) 328 lb 6.4 oz (149 kg)   SpO2 99% Comment: ra  BMI 47.12 kg/m   Brief Summary:  Vanessa [name] is a 46 y.o. female with obstructive sleep apnea and insomnia.      Subjective:   Her home sleep study showed mild sleep apnea.  She doesn't have a dentist currently  She previously got CPAP from [facility], but had difficulty dealing with them.  She would like to use [facility] instead.   Physical Exam:   Appearance - well kempt   ENMT - no sinus tenderness, no oral exudate, no LAN, Mallampati 4 airway, no stridor  Respiratory - equal breath sounds bilaterally, no wheezing or rales  CV - s1s2 regular rate and rhythm, no murmurs  Ext - no clubbing, no edema  Skin - no rashes  Psych - normal mood and affect    Sleep Tests:  HST 08/12/21 >> AHI 8, SpO2 low 85%  Social History:  She  reports that she has never smoked. She has never used smokeless tobacco. She reports that she does not drink alcohol and does not use drugs.  Family History:  Her family history includes Congestive Heart Failure in her mother; Hypertension in her sister; Obesity in her sister; Pulmonary Hypertension in her mother.     Assessment/Plan:   Obstructive sleep apnea. - reviewed her sleep study results - discussed how sleep apnea can impact her health - treatment options discussed - will arrange for auto CPAP set up through  [facility]  Insomnia. - continue trazodone for now - reassess need for sleep aid medication after she is established on therapy for sleep apnea  Obesity. - discussed how weight can impact sleep and risk for sleep disordered breathing - discussed options to assist with weight loss: combination of diet modification, cardiovascular and strength training exercises  Time Spent Involved in Patient Care on Day of Examination:  26 minutes  Follow up:   Patient Instructions  Will arrange for auto CPAP set up through [facility]  Follow up in 3 to 4 months  Medication List:   Allergies as of 10/04/2021       Reactions   Metrizamide Other (See Comments), Itching, Photosensitivity, Shortness Of Breath, Swelling   update Migraine instantly   Mushroom Extract Complex Itching   Throat itching with cough   Peanut Oil Anaphylaxis, Other (See Comments)   update   Peanut-containing Drug Products Itching   Itching throat with cough Itching throat with cough   Diflucan In Dextrose [fluconazole In Dextrose] Hives   Iodinated Contrast Media    Iodine-131 Other (See Comments)   update        Medication List        Accurate as of October 04, 2021 11:02 AM. If you have any questions, ask your nurse or doctor.          acetaZOLAMIDE 125 MG tablet Commonly known  as: DIAMOX Start with one pill twice a day. In 2 weeks if improving and no side effects stay on this dose. If not improving increase to 2 tabs ('250mg'$ ) twice daily and monitor for side effects.   Ajovy 225 MG/1.5ML Sosy Generic drug: Fremanezumab-vfrm INJECT 225 MG INTO THE SKIN EVERY 30 (THIRTY) DAYS. NOT COVERED   amitriptyline 50 MG tablet Commonly known as: ELAVIL Take 1 tablet by mouth daily.   atorvastatin 80 MG tablet Commonly known as: LIPITOR TAKE 1 TABLET BY MOUTH EVERY DAY   cyclobenzaprine 10 MG tablet Commonly known as: FLEXERIL Take 1 tablet (10 mg total) by mouth 3 (three) times daily as needed for muscle  spasms.   empagliflozin 25 MG Tabs tablet Commonly known as: Jardiance Take 1 tablet (25 mg total) by mouth daily before breakfast.   EPINEPHrine 0.3 mg/0.3 mL Soaj injection Commonly known as: EpiPen 2-Pak Inject 0.3 mg into the muscle as needed for anaphylaxis.   flunisolide 25 MCG/ACT (0.025%) Soln Commonly known as: NASALIDE Instill 2 sprays into each nostril twice daily.   furosemide 40 MG tablet Commonly known as: LASIX TAKE 1 TABLET BY MOUTH EVERY DAY   glucose blood test strip Use to test sugar daily. Dx E11.65   lisinopril 40 MG tablet Commonly known as: ZESTRIL Take 1 tablet by mouth daily.   loratadine 10 MG tablet Commonly known as: CLARITIN Take 1 tablet by mouth daily.   megestrol 40 MG tablet Commonly known as: MEGACE TAKE BY MOUTH 3 TABLETS A DAY FOR 5 DAYS, THEN 2 TABLETS A DAY FOR 5 DAYS, THEN 1 TABLET DAILY What changed:  how much to take how to take this when to take this additional instructions   metoprolol succinate 25 MG 24 hr tablet Commonly known as: TOPROL-XL TAKE 1 TABLET (25 MG TOTAL) BY MOUTH DAILY.   montelukast 10 MG tablet Commonly known as: SINGULAIR Take 1 tablet by mouth daily at bedtime.   multivitamin tablet Take 1 tablet by mouth daily.   naproxen 500 MG tablet Commonly known as: NAPROSYN Take 1 tablet (500 mg total) by mouth 2 (two) times daily.   Nurtec 75 MG Tbdp Generic drug: Rimegepant Sulfate Take 75 mg by mouth daily as needed. Onset migraine (max one daily)   OneTouch Delica Lancets 90Z Misc 1 each by Does not apply route daily. Dx E11.9   Oxycodone HCl 10 MG Tabs Take 1 tablet (10 mg total) by mouth 2 (two) times daily as needed.   Oxycodone HCl 10 MG Tabs Take 1 tablet (10 mg total) by mouth 2 (two) times daily as needed.   Oxycodone HCl 10 MG Tabs Take 1 tablet (10 mg total) by mouth 2 (two) times daily as needed. Start taking on: October 20, 2021   Semaglutide (2 MG/DOSE) 8 MG/3ML Sopn Inject 2 mg  as directed once a week.   spironolactone 100 MG tablet Commonly known as: ALDACTONE Take 1 tablet by mouth twice daily.   traZODone 50 MG tablet Commonly known as: DESYREL Take 1-2 tablets (50-100 mg total) by mouth at bedtime as needed. for sleep        Signature:  Chesley Mires, MD [place] Pager - [phone] 10/04/2021, 11:02 AM

## 2021-10-10 ENCOUNTER — Ambulatory Visit (INDEPENDENT_AMBULATORY_CARE_PROVIDER_SITE_OTHER): Payer: 59 | Admitting: Neurology

## 2021-10-10 ENCOUNTER — Telehealth: Payer: Self-pay | Admitting: Neurology

## 2021-10-10 DIAGNOSIS — G932 Benign intracranial hypertension: Secondary | ICD-10-CM

## 2021-10-10 DIAGNOSIS — G43709 Chronic migraine without aura, not intractable, without status migrainosus: Secondary | ICD-10-CM | POA: Diagnosis not present

## 2021-10-10 DIAGNOSIS — Z982 Presence of cerebrospinal fluid drainage device: Secondary | ICD-10-CM | POA: Diagnosis not present

## 2021-10-10 DIAGNOSIS — T8509XA Other mechanical complication of ventricular intracranial (communicating) shunt, initial encounter: Secondary | ICD-10-CM

## 2021-10-10 MED ORDER — ACETAZOLAMIDE 125 MG PO TABS: 250.0000 mg | ORAL_TABLET | Freq: Two times a day (BID) | ORAL | 11 refills | Status: DC

## 2021-10-10 NOTE — Progress Notes (Signed)
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  Location: Patient: Home Provider: office   I discussed the limitations, risks, security and privacy concerns of performing an evaluation and management service by telephone and the availability of in person appointments. I also discussed with the patient that there may be a patient responsible charge related to this service. The patient expressed understanding and agreed to proceed.    I discussed the assessment and treatment plan with the patient. The patient was provided an opportunity to ask questions and all were answered. The patient agreed with the plan and demonstrated an understanding of the instructions.   The patient was advised to call back or seek an in-person evaluation if the symptoms worsen or if the condition fails to improve as anticipated.  I provided 22 minutes of non-face-to-face time during this encounter.   Vanessa Beam, MD   10/10/2021 Vanessa [name]: Vanessa [name] is having Botox, last was her 4th on 09/19/2021. Migraines remain well managed on Ajovy and Nurtec. 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 She was seen by myself on 07/2021 and dose reduced to '125mg'$  BID with plans to increase to '250mg'$  BID. Referral to NS for shunt eval. CT/shunt series showed shunt intact, valve pressure at 140. CT showed ventricles are completely collapsed.   She is doing well on the diamox. The ringing is barely noticeable. She is going to get a new appointment for her eye exam with ophthalmology and today on the way for venous angiogram to see if venous stenting may be an option. She is on 250 in the morning and 125 at night, would go to 250g twice day as long as she is  doing well. Saw Vanessa. Vanessa [name] in [place] 09/10/2021 and on the way for another f/u today  Patient complains of symptoms per HPI as well as the following symptoms: ringing in the ear . Pertinent negatives and positives per HPI. All others negative   08/14/2021: Patient with migraines and IDIOPATHIC INTRACRANIAL HYPERTENSION. Recent exam showed recurrence of papilledema. She was continues on Ajovy and botox and restarted on diamox. She has had a lot of ringing in her ears, ringing in the ears. The pressure headaches started and she saw Vanessa [name] in [place] and a neuroopthalmologist and she is seen in July next by the neuroophthalmologist. Since starting the diamox ringing in the ears. She is having some episodes of blurry vision. Migraines are much better but the pressure headaches are getting her. She has lost another 20 pounds since being diagnosed with alpha gal. Unclear why the papilledema worsened since she has been losing weight. No new events, she was placed on trazodone for sleep but that would not cause the papilledema/increased pressure. She has a shunt in place, last shunt series was in 2019 and was intact, last seen by neurosurgery in 2021 for reprogramming. Patient will call neurosurgery at [name] may need reprogramming of shunt, LP and shunt series again.   Patient complains of symptoms per HPI as well as the following symptoms: pressure headaches . Pertinent negatives and positives per HPI. All others negative   07/09/2021 ALL:  Vanessa [name] returns for follow up following initiation of Botox for migraines. She continues Ajovy and Nurtec. Last procedure  06/25/2021. She feels that migraines are much better. She has about 4-5 migraine days a month, previously reporting daily headaches with 8-12 migraines. Nurtec works well for abortive therapy.   She reports more pressure headaches over the past few months. She feels that her head is going to explode at times. She reports blurred vision at times  (with and without headache). No vision loss or TVO. She reports eye exam last month showed worsening optic nerve edema. She has follow up in 10/2021. BP has been better, usually between 135-145/80's. She is current on furosemide '40mg'$  daily and spironolactone '100mg'$  BID for peripheral edema. Also taking metoprolol daily.   She was diagnosed with alpha gal. She has eliminated milk and red meats from her diet. She is eating mostly chicken, [place] and fish. She reports losing 10lbs. She has never worked with medical weight management. She lives in [place], [place]. A1C improved from 10.2 to 8.2 over past 6 months.   11/30/2021 ALL:  Vanessa [name] returns for follow up for migraines. She was switched from Vanessa [name] to Vanessa [name] at last visit 04/2020. Amitriptyline continued per PCP and Nurtec for abortive therapy. She feels that she may be doing a little better on Ajovy. Nurtec works well for abortive therapy. No vision changes or vision loss, some blurred vision with headache. She is scheduled for eye exam next week. She reports that BP has been elevated at home (145/80-90). She relates this to being nervous about a recent biopsy with GYN. She found out yesterday everything was fine with biopsy. She has a hard time sleeping, since childhood. She is on zaleplon. She feels that she just lies awake at night. Usually only gets 4 hours of sleep each night. She reports CBGs are stable. Last A1C 7.0. She feels mood is good on fluoxetine.   From a thorough review of notes patient's been on multiple medications that can be used in iih and migraine management including acetazolamide (made her feels really bad), topiramate (made headaches worse), propranolol (can't remember but reports side effects), amitriptyline (on now), amlodipine, Benadryl, gabapentin, lisinopril, Zofran, promethazine, naratriptan, maxalt, sumatriptan  05/25/2020 ALL:  Vanessa [name] is a 46 y.o. female here today for follow up for headache follow up. 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 MRI 11/2019 was unremarkable. She was started on Emgality (Ajovy not approved).  Diamox and amitriptyline continued (prescribed by PCP). She reports that around 11/2019 she stopped Diamox due to reports of nausea. She was doing very well from 11/2019 until 04/2020. Over the past month she has noticed worsening headaches. She is having near daily dull, achy headaches in the frontal region. She is having 1-2 migraines a week. She reports smelling diesel fuel before during and after headache. She has nausea and blurred vision with headache. No vision loss in left eye. Blind in right eye. Nurtec helps most with abortive therapy.  A1C at goal. BP has been elevated. She feels that this is due to headaches. She is taking Oxycodone '10mg'$  PRN (1-2 times daily) for knee pain. She has constipation. She reports neuro opthalmology exam was normal with no concerns of optic nerve edema about 2 months ago.   She was seen 3/25 in the ER for classic migraine aborted with migraine cocktail and Fioricet. CT was normal, stable position of shunt and decompressed ventricles.    HISTORY (copied from Vanessa [name] previous note)  HPI:  Vanessa [name] is a 46  y.o. female here as requested by Loman Brooklyn, FNP for migraines.  Patient has a past medical history of IDIOPATHIC INTRACRANIAL HYPERTENSION,  congenital blindness OD, migraines, morbid obesity, hypertension, vision loss, PCOS, diabetes, hyperlipidemia, congenital blindness of right eye, OD glaucoma. 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  She has been evaluated in the past by Vanessa. Jola Schmidt) as well as Vanessa. Octavia Bruckner Martin(neuro-Ophthalmology) and now she sees a neuro- ophthalmologist elsewhere.   Last time she saw neurosurgery was in 2019 and at that time the shunt was in good working condition. Headaches improved. She is still getting migraines however. Some days it feels like head is going to explode. She has not lost weight. She has daily headaches and migraines 2x a week. She wakes up with headaches, not using her cpap. Positional worsening headache, vision changes and blurry vision.The migraines are throbbing, she may smell finny things prior to migraine. Migraines can last 3 days, severe, at least 15 migraine days a month for over a year, light/sound sensitivity, nausea, no vomiting. Daily headaches. Congenital blindness right eye. Headaches are worse in the morning and with position. No weakness, numbness. No other focal neurologic deficits, associated symptoms, inciting events or modifiable factors.  Last seen by neurosurgery in April 2019.   Reviewed notes, labs and imaging from outside physicians, which showed:   Reviewed imaging from January 2019 CT of the head, interval placement of a left frontal approach VP shunt catheter, slitlike appearance of the lateral and third ventricles.  Evidence of a prior right frontal burr hole site.  XR shunt series showed left VP shunt catheter tubing coursing along the left hemithorax and into the abdomen and terminates into the left lower quadrant, no shunt kinking or discontinuity.    From a thorough review of notes  patient's been on multiple medications that can be used in iih and migraine management including acetazolamide, propranolol, amitriptyline, amlodipine, Benadryl, gabapentin, lisinopril, Zofran, promethazine,topamax, naratriptan, maxalt, sumatriptan    REVIEW OF SYSTEMS: Out of a complete 14 system review of symptoms, the patient complains only of the following symptoms, headaches, chronic knee pain, blurred vision, and all other reviewed systems are negative.   ALLERGIES: Allergies  Allergen Reactions   Metrizamide Other (See Comments), Itching, Photosensitivity, Shortness Of Breath and Swelling    update Migraine instantly   Mushroom Extract Complex Itching    Throat itching with cough   Peanut Oil Anaphylaxis and Other (See Comments)    update   Peanut-Containing Drug Products Itching    Itching throat with cough Itching throat with cough   Diflucan In Dextrose [Fluconazole In Dextrose] Hives   Iodinated Contrast Media    Iodine-131 Other (See Comments)    update     HOME MEDICATIONS: Outpatient Medications Prior to Visit  Medication Sig Dispense Refill   AJOVY 225 MG/1.5ML SOSY  INJECT 225 MG INTO THE SKIN EVERY 30 (THIRTY) DAYS. NOT COVERED 1.5 mL 11   amitriptyline (ELAVIL) 50 MG tablet Take 1 tablet by mouth daily. 90 tablet 0   atorvastatin (LIPITOR) 80 MG tablet TAKE 1 TABLET BY MOUTH EVERY DAY 90 tablet 0   cyclobenzaprine (FLEXERIL) 10 MG tablet Take 1 tablet (10 mg total) by mouth 3 (three) times daily as needed for muscle spasms. 60 tablet 2   empagliflozin (JARDIANCE) 25 MG TABS tablet Take 1 tablet (25 mg total) by mouth daily before breakfast. 90 tablet 1   EPINEPHrine (EPIPEN 2-PAK) 0.3 mg/0.3 mL IJ SOAJ injection Inject 0.3 mg into the muscle as needed for anaphylaxis. 2 each 1   flunisolide (NASALIDE) 25 MCG/ACT (0.025%) SOLN Instill 2 sprays into each nostril twice daily. 25 mL 0   furosemide (LASIX) 40 MG tablet TAKE 1 TABLET BY MOUTH EVERY DAY 90 tablet 0    glucose blood test strip Use to test sugar daily. Dx E11.65 100 each 2   lisinopril (ZESTRIL) 40 MG tablet Take 1 tablet by mouth daily. 90 tablet 0   loratadine (CLARITIN) 10 MG tablet Take 1 tablet by mouth daily. 90 tablet 1   megestrol (MEGACE) 40 MG tablet TAKE 3 TABLETS EVERY DAY FOR 5 DAYS TAKE 2 TABLETS EVERY DAY FOR 5 DAYS TAKE 1 TABLET EVERY DAY 135 tablet 1   metoprolol succinate (TOPROL-XL) 25 MG 24 hr tablet TAKE 1 TABLET (25 MG TOTAL) BY MOUTH DAILY. 90 tablet 0   montelukast (SINGULAIR) 10 MG tablet Take 1 tablet by mouth daily at bedtime. 90 tablet 1   Multiple Vitamin (MULTIVITAMIN) tablet Take 1 tablet by mouth daily.     naproxen (NAPROSYN) 500 MG tablet Take 1 tablet (500 mg total) by mouth 2 (two) times daily. 60 tablet 2   OneTouch Delica Lancets 37T MISC 1 each by Does not apply route daily. Dx E11.9 100 each 3   [START ON 10/20/2021] Oxycodone HCl 10 MG TABS Take 1 tablet (10 mg total) by mouth 2 (two) times daily as needed. 60 tablet 0   Oxycodone HCl 10 MG TABS Take 1 tablet (10 mg total) by mouth 2 (two) times daily as needed. 60 tablet 0   Oxycodone HCl 10 MG TABS Take 1 tablet (10 mg total) by mouth 2 (two) times daily as needed. 60 tablet 0   Rimegepant Sulfate (NURTEC) 75 MG TBDP Take 75 mg by mouth daily as needed. Onset migraine (max one daily) 10 tablet 5   Semaglutide, 2 MG/DOSE, 8 MG/3ML SOPN Inject 2 mg as directed once a week. 9 mL 1   spironolactone (ALDACTONE) 100 MG tablet Take 1 tablet by mouth twice daily. 180 tablet 0   traZODone (DESYREL) 50 MG tablet Take 1-2 tablets (50-100 mg total) by mouth at bedtime as needed. for sleep 180 tablet 1   acetaZOLAMIDE (DIAMOX) 125 MG tablet Start with one pill twice a day. In 2 weeks if improving and no side effects stay on this dose. If not improving increase to 2 tabs ('250mg'$ ) twice daily and monitor for side effects. 60 tablet 6   No facility-administered medications prior to visit.     PAST MEDICAL  HISTORY: 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   Hypertension    Migraines    Pseudotumor (inflammatory) of orbit    Sepsis ([place]) 02/2019   Sleep apnea    Vaginal Pap smear, abnormal  Vision loss      PAST SURGICAL HISTORY: Past Surgical History:  Procedure Laterality Date   BRAIN SURGERY     CSF SHUNT       FAMILY HISTORY: Family History  Problem Relation Age of Onset   Pulmonary Hypertension Mother    Congestive Heart Failure Mother    Hypertension Sister    Obesity Sister    Migraines Neg Hx      SOCIAL HISTORY: Social History   Socioeconomic History   Marital status: Single    Spouse name: Not on file   Number of children: 0   Years of education: Not on file   Highest education level: Not on file  Occupational History   Occupation: disability  Tobacco Use   Smoking status: Never   Smokeless tobacco: Never  Vaping Use   Vaping Use: Never used  Substance and Sexual Activity   Alcohol use: No   Drug use: No   Sexual activity: Not Currently    Birth control/protection: Condom  Other Topics Concern   Not on file  Social History Narrative   Lives with parents   Right handed   Caffeine: maybe 2 cups/day   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   Social Determinants of Health   Financial Resource Strain: Low Risk  (01/24/2021)   Overall Financial Resource Strain (CARDIA)    Difficulty of Paying Living Expenses: Not very hard  Food Insecurity: Food Insecurity Present (01/24/2021)   Hunger Vital Sign    Worried About Running Out of Food in the Last Year: Sometimes true    Ran Out of Food in the Last Year: Sometimes true  Transportation Needs: No Transportation Needs (01/24/2021)   PRAPARE - Hydrologist (Medical): No    Lack of Transportation (Non-Medical): No  Physical Activity: Insufficiently Active (01/24/2021)   Exercise Vital Sign    Days of Exercise per Week: 7 days     Minutes of Exercise per Session: 20 min  Stress: No Stress Concern Present (01/24/2021)   [place]    Feeling of Stress : Only a little  Social Connections: Moderately Isolated (01/24/2021)   Social Connection and Isolation Panel [NHANES]    Frequency of Communication with Friends and Family: More than three times a week    Frequency of Social Gatherings with Friends and Family: More than three times a week    Attends Religious Services: 1 to 4 times per year    Active Member of [employer] or Organizations: No    Attends Archivist Meetings: Never    Marital Status: Never married  Intimate Partner Violence: Not At Risk (01/24/2021)   Humiliation, Afraid, Rape, and Kick questionnaire    Fear of Current or Ex-Partner: No    Emotionally Abused: No    Physically Abused: No    Sexually Abused: No     Physical exam: Exam: Gen: NAD, conversant  Neuro: Detailed Neurologic Exam  Speech:    Speech is normal; fluent and spontaneous with normal comprehension.  Cognition:    The patient is oriented to person, place, and time;     recent and remote memory intact;     language fluent;     normal attention, concentration,     fund of knowledge   DIAGNOSTIC DATA (LABS, IMAGING, TESTING) - I reviewed patient records, labs, notes, testing and imaging myself where available.  Lab Results  Component Value Date   WBC  7.6 08/21/2021   HGB 10.8 (L) 08/21/2021   HCT 35.1 08/21/2021   MCV 80 08/21/2021   PLT 199 08/21/2021      Component Value Date/Time   NA 137 08/21/2021 1015   K 4.1 08/21/2021 1015   CL 107 (H) 08/21/2021 1015   CO2 16 (L) 08/21/2021 1015   GLUCOSE 109 (H) 08/21/2021 1015   BUN 15 08/21/2021 1015   CREATININE 1.16 (H) 08/21/2021 1015   CALCIUM 9.2 08/21/2021 1015   PROT 7.0 08/21/2021 1015   ALBUMIN 3.9 08/21/2021 1015   AST 13 08/21/2021 1015   ALT 19 08/21/2021 1015   ALKPHOS 58  08/21/2021 1015   BILITOT 0.5 08/21/2021 1015   GFRNONAA 79 04/06/2020 1329   GFRAA 91 04/06/2020 1329   Lab Results  Component Value Date   CHOL 138 08/21/2021   HDL 29 (L) 08/21/2021   LDLCALC 92 08/21/2021   TRIG 91 08/21/2021   CHOLHDL 4.8 (H) 08/21/2021   Lab Results  Component Value Date   HGBA1C 7.3 (H) 08/21/2021   No results found for: "VITAMINB12" No results found for: "TSH"      No data to display               No data to display           ASSESSMENT AND PLAN  46 y.o. year old female  has a past medical history of Allergy, Allergy to alpha-gal (05/21/2021), Hypertension, Migraines, Pseudotumor (inflammatory) of orbit, Sepsis ([place]) (02/2019), Sleep apnea, Vaginal Pap smear, abnormal, and Vision loss. here with   IIH (idiopathic intracranial hypertension) - Plan: acetaZOLAMIDE (DIAMOX) 125 MG tablet  Morbid obesity (HCC)  S/P ventriculoperitoneal shunt  Malfunction of ventriculoperitoneal shunt, initial encounter (HCC)  Chronic migraine without aura without status migrainosus, not intractable  - New pressure headaches and papilledema in IDIOPATHIC INTRACRANIAL HYPERTENSION patient with a shunt despite weight loss. Has shunt managed by [facility] neurosugery, last saw Alecia Lemming and Vanessa. Vanessa [name] placed the shunt. Now with papilledema again, She has a shunt in place, last shunt series was in 2019 and was intact, last seen by neurosurgery in 2021 for reprogramming. Repeat CT/shunt series showed shunt intact, valve pressure at 140. CT showed ventricles are completely collapsed. She is doing well on the diamox. The ringing is barely noticeable. Currently on the way for venous angiogram to see if venous stenting may be an option. She is on diamox 250 in the morning and 125 at night, would go to '250mg'$  twice day as long as she is doing well. Saw Vanessa. Vanessa [name] in [place] 09/10/2021 and on the way for another f/u today  - Talana is having Botox, last was her 4th on 09/19/2021.  Migraines remain well managed on Ajovy and Nurtec and botox.  - She is going to get a follow up appointment for her eye exam with ophthalmology, discussed importance. Eye exam was reportedly normal in 02/2020 but showed worsening optic nerve edema 05/2021.  - Tatiyana is doing much better from a migraine perspective. Previously having daily headaches with at least 8-12 migrainous days. Now reports less than 4-5 migraine days per month. She reports pressure headaches are worsening. We have discussed potential contributors including worsening IIH, BP elevation, inconsistent use of CPAP, weather changes, stress, etc.  She is on Lasix and spironolactone.  Healthy lifestyle habits encouraged. She will monitor BP closely. She will follow up for Botox. She verbalizes understanding and agreement with this plan.   - phone call  today because (she lives an hour away in [place], would prefer office visit but difficult for her to et here and she has ophthalmologist and neuro-ophthalmologist follow up near her)  - call office and follow up in 3 months with Vanessa Vanessa [name]  Sarina Ill MD  [name] [address], [place], [place] [address] [phone]

## 2021-10-10 NOTE — Telephone Encounter (Signed)
Would you schedule a follow up with patient via video with me or amy in 3 months please? thanks

## 2021-10-15 ENCOUNTER — Ambulatory Visit: Payer: 59 | Admitting: Nutrition

## 2021-11-03 ENCOUNTER — Other Ambulatory Visit: Payer: Self-pay | Admitting: Family Medicine

## 2021-11-03 DIAGNOSIS — M542 Cervicalgia: Secondary | ICD-10-CM

## 2021-11-03 DIAGNOSIS — G43709 Chronic migraine without aura, not intractable, without status migrainosus: Secondary | ICD-10-CM

## 2021-11-11 ENCOUNTER — Telehealth: Payer: 59 | Admitting: Nurse Practitioner

## 2021-11-11 DIAGNOSIS — U071 COVID-19: Secondary | ICD-10-CM

## 2021-11-11 MED ORDER — PROMETHAZINE-DM 6.25-15 MG/5ML PO SYRP: 5.0000 mL | ORAL_SOLUTION | Freq: Four times a day (QID) | ORAL | 0 refills | Status: DC | PRN

## 2021-11-11 MED ORDER — MOLNUPIRAVIR EUA 200MG CAPSULE: 4.0000 | ORAL_CAPSULE | Freq: Two times a day (BID) | ORAL | 0 refills | Status: AC

## 2021-11-11 NOTE — Progress Notes (Signed)
Virtual Visit Consent   Vanessa [name], you are scheduled for a virtual visit with a [facility] provider today. Just as with appointments in the office, your consent must be obtained to participate. Your consent will be active for this visit and any virtual visit you may have with one of our providers in the next 365 days. If you have a MyChart account, a copy of this consent can be sent to you electronically.  As this is a virtual visit, video technology does not allow for your provider to perform a traditional examination. This may limit your provider's ability to fully assess your condition. If your provider identifies any concerns that need to be evaluated in person or the need to arrange testing (such as labs, EKG, etc.), we will make arrangements to do so. Although advances in technology are sophisticated, we cannot ensure that it will always work on either your end or our end. If the connection with a video visit is poor, the visit may have to be switched to a telephone visit. With either a video or telephone visit, we are not always able to ensure that we have a secure connection.  By engaging in this virtual visit, you consent to the provision of healthcare and authorize for your insurance to be billed (if applicable) for the services provided during this visit. Depending on your insurance coverage, you may receive a charge related to this service.  I need to obtain your verbal consent now. Are you willing to proceed with your visit today? Vanessa [name] has provided verbal consent on 11/11/2021 for a virtual visit (video or telephone). Vanessa Pounds, NP  Date: 11/11/2021 8:35 AM  Virtual Visit via Video Note   I, Vanessa [name], connected with  Vanessa [name]  ([id number], 01-24-1976) on 11/11/21 at  8:30 AM EDT by a video-enabled telemedicine application and verified that I am speaking with the correct person using two identifiers.  Location: Patient: Virtual Visit Location Patient:  Home Provider: Virtual Visit Location Provider: Home Office   I discussed the limitations of evaluation and management by telemedicine and the availability of in person appointments. The patient expressed understanding and agreed to proceed.    History of Present Illness: Vanessa [name] is a 46 y.o. who identifies as a female who was assigned female at birth, and is being seen today for positive home COVID test.  Tested positive for COVID this morning. Onset of Symptoms: TODAY. States she has been testing herself for COVID since Thursday as her parents whom she lives with are currently COVID positive as well. Current symptoms: stuffy nose, mild headache, cough.    Problems:  Patient Active Problem List   Diagnosis Date Noted   Allergy to alpha-gal 05/21/2021   Seasonal allergies 01/04/2021   Cervical pain 01/04/2021   Type 2 diabetes mellitus with hyperglycemia, without long-term current use of insulin ([place]) 01/04/2021   Controlled substance agreement signed 04/06/2020   Chronic migraine without aura without status migrainosus, not intractable 10/05/2019   Bilateral primary osteoarthritis of knee 08/11/2019   Bilateral pes planus 08/11/2019   Hyperlipidemia associated with type 2 diabetes mellitus ([place]) 21/30/8657   Metabolic syndrome 84/69/6295   Essential hypertension 03/04/2016   PCOS (polycystic ovarian syndrome) 03/04/2016   Morbid obesity with BMI of 45.0-49.9, adult ([place]) 11/21/2015   S/P ventriculoperitoneal shunt 10/25/2015   IIH (idiopathic intracranial hypertension) 09/21/2015   Blindness of right eye 08/14/2015   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    Allergies:  Allergies  Allergen  Reactions   Metrizamide Other (See Comments), Itching, Photosensitivity, Shortness Of Breath and Swelling    update Migraine instantly   Mushroom Extract Complex Itching    Throat itching with cough   Peanut Oil Anaphylaxis and Other (See Comments)    update   Peanut-Containing Drug  Products Itching    Itching throat with cough Itching throat with cough   Diflucan In Dextrose [Fluconazole In Dextrose] Hives   Iodinated Contrast Media    Iodine-131 Other (See Comments)    update   Medications:  Current Outpatient Medications:    molnupiravir EUA (LAGEVRIO) 200 mg CAPS capsule, Take 4 capsules (800 mg total) by mouth 2 (two) times daily for 5 days., Disp: 40 capsule, Rfl: 0   promethazine-dextromethorphan (PROMETHAZINE-DM) 6.25-15 MG/5ML syrup, Take 5 mLs by mouth 4 (four) times daily as needed for cough., Disp: 240 mL, Rfl: 0   acetaZOLAMIDE (DIAMOX) 125 MG tablet, Take 2 tablets (250 mg total) by mouth 2 (two) times daily., Disp: 60 tablet, Rfl: 11   AJOVY 225 MG/1.5ML SOSY, INJECT 225 MG INTO THE SKIN EVERY 30 (THIRTY) DAYS. NOT COVERED, Disp: 1.5 mL, Rfl: 11   amitriptyline (ELAVIL) 50 MG tablet, TAKE 1 TABLET BY MOUTH EVERY DAY, Disp: 90 tablet, Rfl: 0   atorvastatin (LIPITOR) 80 MG tablet, TAKE 1 TABLET BY MOUTH EVERY DAY, Disp: 90 tablet, Rfl: 0   cyclobenzaprine (FLEXERIL) 10 MG tablet, TAKE 1 TABLET BY MOUTH THREE TIMES A DAY AS NEEDED FOR MUSCLE SPASMS, Disp: 60 tablet, Rfl: 2   empagliflozin (JARDIANCE) 25 MG TABS tablet, Take 1 tablet (25 mg total) by mouth daily before breakfast., Disp: 90 tablet, Rfl: 1   EPINEPHrine (EPIPEN 2-PAK) 0.3 mg/0.3 mL IJ SOAJ injection, Inject 0.3 mg into the muscle as needed for anaphylaxis., Disp: 2 each, Rfl: 1   flunisolide (NASALIDE) 25 MCG/ACT (0.025%) SOLN, Instill 2 sprays into each nostril twice daily., Disp: 25 mL, Rfl: 0   furosemide (LASIX) 40 MG tablet, TAKE 1 TABLET BY MOUTH EVERY DAY, Disp: 90 tablet, Rfl: 0   glucose blood test strip, Use to test sugar daily. Dx E11.65, Disp: 100 each, Rfl: 2   lisinopril (ZESTRIL) 40 MG tablet, Take 1 tablet by mouth daily., Disp: 90 tablet, Rfl: 0   loratadine (CLARITIN) 10 MG tablet, Take 1 tablet by mouth daily., Disp: 90 tablet, Rfl: 1   megestrol (MEGACE) 40 MG tablet, TAKE 3  TABLETS EVERY DAY FOR 5 DAYS TAKE 2 TABLETS EVERY DAY FOR 5 DAYS TAKE 1 TABLET EVERY DAY, Disp: 135 tablet, Rfl: 1   metoprolol succinate (TOPROL-XL) 25 MG 24 hr tablet, TAKE 1 TABLET (25 MG TOTAL) BY MOUTH DAILY., Disp: 90 tablet, Rfl: 0   montelukast (SINGULAIR) 10 MG tablet, Take 1 tablet by mouth daily at bedtime., Disp: 90 tablet, Rfl: 1   Multiple Vitamin (MULTIVITAMIN) tablet, Take 1 tablet by mouth daily., Disp: , Rfl:    naproxen (NAPROSYN) 500 MG tablet, Take 1 tablet (500 mg total) by mouth 2 (two) times daily., Disp: 60 tablet, Rfl: 2   OneTouch Delica Lancets 99B MISC, 1 each by Does not apply route daily. Dx E11.9, Disp: 100 each, Rfl: 3   Oxycodone HCl 10 MG TABS, Take 1 tablet (10 mg total) by mouth 2 (two) times daily as needed., Disp: 60 tablet, Rfl: 0   Oxycodone HCl 10 MG TABS, Take 1 tablet (10 mg total) by mouth 2 (two) times daily as needed., Disp: 60 tablet, Rfl: 0  Oxycodone HCl 10 MG TABS, Take 1 tablet (10 mg total) by mouth 2 (two) times daily as needed., Disp: 60 tablet, Rfl: 0   Rimegepant Sulfate (NURTEC) 75 MG TBDP, Take 75 mg by mouth daily as needed. Onset migraine (max one daily), Disp: 10 tablet, Rfl: 5   Semaglutide, 2 MG/DOSE, 8 MG/3ML SOPN, Inject 2 mg as directed once a week., Disp: 9 mL, Rfl: 1   spironolactone (ALDACTONE) 100 MG tablet, Take 1 tablet by mouth twice daily., Disp: 180 tablet, Rfl: 0   traZODone (DESYREL) 50 MG tablet, Take 1-2 tablets (50-100 mg total) by mouth at bedtime as needed. for sleep, Disp: 180 tablet, Rfl: 1  Observations/Objective: Patient is well-developed, well-nourished in no acute distress.  Resting comfortably at home.  Head is normocephalic, atraumatic.  No labored breathing.  Speech is clear and coherent with logical content.  Patient is alert and oriented at baseline.    Assessment and Plan: 1. Positive self-administered antigen test for COVID-19 - molnupiravir EUA (LAGEVRIO) 200 mg CAPS capsule; Take 4 capsules  (800 mg total) by mouth 2 (two) times daily for 5 days.  Dispense: 40 capsule; Refill: 0 - promethazine-dextromethorphan (PROMETHAZINE-DM) 6.25-15 MG/5ML syrup; Take 5 mLs by mouth 4 (four) times daily as needed for cough.  Dispense: 240 mL; Refill: 0  Please keep well-hydrated and get plenty of rest. Start a saline nasal rinse to flush out your nasal passages. You can use plain Mucinex to help thin congestion. If you have a humidifier, you can use this daily as needed.    You are to wear a mask for 5 days from onset of your symptoms.  After day 5, if you have had no fever and you are feeling better with NO symptoms, you can end masking. Keep in mind you can be contagious 10 days from the onset of symptoms  After day 5 if you have a fever or are having significant symptoms, please wear your mask for full 10 days.   If you note any worsening of symptoms, any significant shortness of breath or any chest pain, please seek ER evaluation ASAP.  Please do not delay care!    If you note any worsening of symptoms, any significant shortness of breath or any chest pain, please seek ER evaluation ASAP.  Please do not delay care!   Follow Up Instructions: I discussed the assessment and treatment plan with the patient. The patient was provided an opportunity to ask questions and all were answered. The patient agreed with the plan and demonstrated an understanding of the instructions.  A copy of instructions were sent to the patient via MyChart unless otherwise noted below.    The patient was advised to call back or seek an in-person evaluation if the symptoms worsen or if the condition fails to improve as anticipated.  Time:  I spent 11 minutes with the patient via telehealth technology discussing the above problems/concerns.    Vanessa Pounds, NP

## 2021-11-15 ENCOUNTER — Other Ambulatory Visit: Payer: Self-pay | Admitting: Family Medicine

## 2021-11-15 DIAGNOSIS — I10 Essential (primary) hypertension: Secondary | ICD-10-CM

## 2021-11-26 ENCOUNTER — Other Ambulatory Visit: Payer: Self-pay | Admitting: Family Medicine

## 2021-11-26 ENCOUNTER — Encounter: Payer: 59 | Attending: Nurse Practitioner | Admitting: Nutrition

## 2021-11-26 VITALS — Ht 70.0 in | Wt 325.6 lb

## 2021-11-26 DIAGNOSIS — E1169 Type 2 diabetes mellitus with other specified complication: Secondary | ICD-10-CM | POA: Diagnosis not present

## 2021-11-26 DIAGNOSIS — Z713 Dietary counseling and surveillance: Secondary | ICD-10-CM | POA: Diagnosis present

## 2021-11-26 DIAGNOSIS — Z6841 Body Mass Index (BMI) 40.0 and over, adult: Secondary | ICD-10-CM | POA: Diagnosis not present

## 2021-11-26 DIAGNOSIS — E1165 Type 2 diabetes mellitus with hyperglycemia: Secondary | ICD-10-CM | POA: Diagnosis not present

## 2021-11-26 DIAGNOSIS — E785 Hyperlipidemia, unspecified: Secondary | ICD-10-CM | POA: Diagnosis not present

## 2021-11-26 DIAGNOSIS — I10 Essential (primary) hypertension: Secondary | ICD-10-CM | POA: Diagnosis not present

## 2021-11-26 DIAGNOSIS — G932 Benign intracranial hypertension: Secondary | ICD-10-CM | POA: Insufficient documentation

## 2021-11-26 DIAGNOSIS — M65332 Trigger finger, left middle finger: Secondary | ICD-10-CM

## 2021-11-26 NOTE — Patient Instructions (Addendum)
Goals  Try to go to gym or PF.  Exercise 30 minutes Work on meal prepping Work on getting more sleep. Lose 1 lb per week weight loss Get A1c TO 6.5% OR BELOW.

## 2021-11-26 NOTE — Progress Notes (Unsigned)
Medical Nutrition Therapy  Appointment Start time:  0930  Appointment End time:  7  Primary concerns today: Obesity, HTN and Type 2 DM  Referral diagnosis: E66.01, E11.8, I10.0 Preferred learning style: No preference) Learning readiness: Change in progress    NUTRITION ASSESSMENT  Lost 50 lbs over the last 2 years. Changes Cut out red due to alpha gal, cut out most of her candy bars, chooses smaller one. Cut out juices, sodas, cut out dairy products, ice cream. Uses almond or oatmeal. Walks some.  PCP now is  Anthropometrics  Wt Readings from Last 3 Encounters:  11/26/21 (!) 325 lb 9.6 oz (147.7 kg)  10/04/21 (!) 328 lb 6.4 oz (149 kg)  08/21/21 (!) 318 lb (144.2 kg)   Ht Readings from Last 3 Encounters:  11/26/21 _0  (1.778 m)  10/04/21 _1  (1.778 m)  08/21/21 _2  (1.778 m)   Body mass index is 46.72 kg/m. _3 @ Facility age limit for growth %iles is 20 years. Facility age limit for growth %iles is 20 years.    Clinical Medical Hx: *** Medications: *** Labs:  Lab Results  Component Value Date   HGBA1C 7.3 (H) 08/21/2021    Notable Signs/Symptoms: ***  Lifestyle & Dietary Hx Live with her parents. She does the cooking and shopping  Estimated daily fluid intake: 80 oz Supplements: MIV,Probiotic Sleep: 4 hrs  Has sleep epnea Just got a cpap  Stress / self-care: none Current average weekly physical activity: walks some  24-Hr Dietary Recall First Meal: bagel  with 2 boiled eggs, water Snack:  Second Meal: Baked chicken, mac/cheese, green beans, water Snack: 2 cookies Third Meal: salad chicken walnut and apple, cranberries,  water Snack: water Beverages: water  Estimated Energy Needs Calories: *** Carbohydrate: ***g Protein: ***g Fat: ***g   NUTRITION DIAGNOSIS  {CHL AMB NUTRITIONAL DIAGNOSIS:[id number]}   NUTRITION INTERVENTION  Nutrition education (E-1) on the following topics:  ***  Handouts Provided Include  ***  Learning  Style & Readiness for Change Teaching method utilized: Visual & Auditory  Demonstrated degree of understanding via: Teach Back  Barriers to learning/adherence to lifestyle change: ***  Goals Established by Pt ***   MONITORING & EVALUATION Dietary intake, weekly physical activity, and *** in ***.  Next Steps  Patient is to ***.

## 2021-11-27 ENCOUNTER — Encounter: Payer: Self-pay | Admitting: Family Medicine

## 2021-11-28 ENCOUNTER — Ambulatory Visit (INDEPENDENT_AMBULATORY_CARE_PROVIDER_SITE_OTHER): Payer: 59 | Admitting: Nurse Practitioner

## 2021-11-28 ENCOUNTER — Encounter: Payer: Self-pay | Admitting: Nurse Practitioner

## 2021-11-28 VITALS — BP 148/83 | HR 81 | Temp 98.0°F | Ht 70.0 in | Wt 324.0 lb

## 2021-11-28 DIAGNOSIS — Z0000 Encounter for general adult medical examination without abnormal findings: Secondary | ICD-10-CM | POA: Diagnosis not present

## 2021-11-28 LAB — BAYER DCA HB A1C WAIVED: HB A1C (BAYER DCA - WAIVED): 6.2 % — ABNORMAL HIGH (ref 4.8–5.6)

## 2021-11-28 NOTE — Assessment & Plan Note (Signed)
Completed physical assessment, provided education to patient on preventative care and health maintenance. Labs completed cbc, CMP , lipid  panel, a1c

## 2021-11-28 NOTE — Patient Instructions (Signed)

## 2021-11-28 NOTE — Progress Notes (Signed)
New Patient Note  RE: Vanessa [name] MRN: [id number] DOB: [birth year] Date of Office Visit: 11/28/2021  Chief Complaint: Annual Exam  History of Present Illness: .   Encounter for general adult medical examination   Physical: Patient's last physical exam was 1 year ago .  Weight: Appropriate for height (BMI less than 27%)  Blood Pressure: Normal (BP greater than 120/80) ;  Medical History: Patient history reviewed ; Family history reviewed ;  Allergies Reviewed: No change in current allergies ;  Medications Reviewed: Medications reviewed - no changes ;  Lipids: Normal lipid levels ; labs completed results pending Smoking: Life-long non-smoker ;  Physical Activity: Exercises at least 3 times per week ; No  Alcohol/Drug Use: Is a non-drinker ; No illicit drug use ;  Patient is not afflicted from Stress Incontinence and Urge Incontinence  Safety: reviewed ; Patient wears a seat belt, has smoke detectors, has carbon monoxide detectors, practices appropriate gun safety, and wears sunscreen with extended sun exposure. Dental Care: biannual cleanings, brushes and flosses daily. Ophthalmology/Optometry: Annual visit.  Hearing loss: none Vision impairments: wears prescription glasses  Assessment and Plan: Vanessa [name] is a 46 y.o. female with: Well adult exam Completed physical assessment, provided education to patient on preventative care and health maintenance. Labs completed cbc, CMP , lipid  panel, a1c  Return in about 3 months (around 02/28/2022).   Diagnostics:   Past Medical History: Patient Active Problem List   Diagnosis Date Noted   Well adult exam 11/28/2021   Allergy to alpha-gal 05/21/2021   Seasonal allergies 01/04/2021   Cervical pain 01/04/2021   Type 2 diabetes mellitus with hyperglycemia, without long-term current use of insulin (Vanessa [name]) 01/04/2021   Controlled substance agreement signed 04/06/2020   Chronic migraine without aura without status migrainosus, not  intractable 10/05/2019   Bilateral primary osteoarthritis of knee 08/11/2019   Bilateral pes planus 08/11/2019   Hyperlipidemia associated with type 2 diabetes mellitus ([place]) 63/78/5885   Metabolic syndrome 02/77/4128   Essential hypertension 03/04/2016   PCOS (polycystic ovarian syndrome) 03/04/2016   Morbid obesity with BMI of 45.0-49.9, adult ([place]) 11/21/2015   S/P ventriculoperitoneal shunt 10/25/2015   IIH (idiopathic intracranial hypertension) 09/21/2015   Blindness of right eye 08/14/2015   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   Hypertension    Migraines    Pseudotumor (inflammatory) of orbit    Sepsis ([place]) 02/2019   Sleep apnea    Vaginal Pap smear, abnormal    Vision loss    Past Surgical History: Past Surgical History:  Procedure Laterality Date   BRAIN SURGERY     CSF SHUNT     Medication List:  Current Outpatient Medications  Medication Sig Dispense Refill   acetaZOLAMIDE (DIAMOX) 125 MG tablet Take 2 tablets (250 mg total) by mouth 2 (two) times daily. 60 tablet 11   AJOVY 225 MG/1.5ML SOSY INJECT 225 MG INTO THE SKIN EVERY 30 (THIRTY) DAYS. NOT COVERED 1.5 mL 11   amitriptyline (ELAVIL) 50 MG tablet TAKE 1 TABLET BY MOUTH EVERY DAY 90 tablet 0   atorvastatin (LIPITOR) 80 MG tablet TAKE 1 TABLET BY MOUTH EVERY DAY 90 tablet 0   cyclobenzaprine (FLEXERIL) 10 MG tablet TAKE 1 TABLET BY MOUTH THREE TIMES A DAY AS NEEDED FOR MUSCLE SPASMS 60 tablet 2   empagliflozin (JARDIANCE) 25 MG TABS tablet Take 1 tablet (25 mg total) by mouth daily before breakfast.  90 tablet 1   EPINEPHrine (EPIPEN 2-PAK) 0.3 mg/0.3 mL IJ SOAJ injection Inject 0.3 mg into the muscle as needed for anaphylaxis. 2 each 1   flunisolide (NASALIDE) 25 MCG/ACT (0.025%) SOLN Instill 2 sprays into each nostril twice daily. 25 mL 0   furosemide (LASIX) 40 MG tablet TAKE 1 TABLET BY MOUTH EVERY DAY 90 tablet 0   glucose  blood test strip Use to test sugar daily. Dx E11.65 100 each 2   lisinopril (ZESTRIL) 40 MG tablet TAKE 1 TABLET BY MOUTH EVERY DAY 90 tablet 0   loratadine (CLARITIN) 10 MG tablet Take 1 tablet by mouth daily. 90 tablet 1   megestrol (MEGACE) 40 MG tablet TAKE 3 TABLETS EVERY DAY FOR 5 DAYS TAKE 2 TABLETS EVERY DAY FOR 5 DAYS TAKE 1 TABLET EVERY DAY 135 tablet 1   metoprolol succinate (TOPROL-XL) 25 MG 24 hr tablet TAKE 1 TABLET (25 MG TOTAL) BY MOUTH DAILY. 90 tablet 0   montelukast (SINGULAIR) 10 MG tablet Take 1 tablet by mouth daily at bedtime. 90 tablet 1   Multiple Vitamin (MULTIVITAMIN) tablet Take 1 tablet by mouth daily.     naproxen (NAPROSYN) 500 MG tablet TAKE 1 TABLET BY MOUTH TWICE A DAY 60 tablet 0   promethazine-dextromethorphan (PROMETHAZINE-DM) 6.25-15 MG/5ML syrup Take 5 mLs by mouth 4 (four) times daily as needed for cough. 240 mL 0   Rimegepant Sulfate (NURTEC) 75 MG TBDP Take 75 mg by mouth daily as needed. Onset migraine (max one daily) 10 tablet 5   Semaglutide, 2 MG/DOSE, 8 MG/3ML SOPN Inject 2 mg as directed once a week. 9 mL 1   spironolactone (ALDACTONE) 100 MG tablet Take 1 tablet by mouth twice daily. 180 tablet 0   traZODone (DESYREL) 50 MG tablet Take 1-2 tablets (50-100 mg total) by mouth at bedtime as needed. for sleep 180 tablet 1   OneTouch Delica Lancets 47Q MISC 1 each by Does not apply route daily. Dx E11.9 100 each 3   Oxycodone HCl 10 MG TABS Take 1 tablet (10 mg total) by mouth 2 (two) times daily as needed. (Patient not taking: Reported on 11/26/2021) 60 tablet 0   Oxycodone HCl 10 MG TABS Take 1 tablet (10 mg total) by mouth 2 (two) times daily as needed. (Patient not taking: Reported on 11/26/2021) 60 tablet 0   Oxycodone HCl 10 MG TABS Take 1 tablet (10 mg total) by mouth 2 (two) times daily as needed. (Patient not taking: Reported on 11/26/2021) 60 tablet 0   No current facility-administered medications for this visit.   Allergies: Allergies   Allergen Reactions   Metrizamide Other (See Comments), Itching, Photosensitivity, Shortness Of Breath and Swelling    update Migraine instantly   Mushroom Extract Complex Itching    Throat itching with cough   Peanut Oil Anaphylaxis and Other (See Comments)    update   Peanut-Containing Drug Products Itching    Itching throat with cough Itching throat with cough   Diflucan In Dextrose [Fluconazole In Dextrose] Hives   Iodinated Contrast Media    Iodine-131 Other (See Comments)    update   Social History: Social History   Socioeconomic History   Marital status: Single    Spouse name: Not on file   Number of children: 0   Years of education: Not on file   Highest education level: Not on file  Occupational History   Occupation: disability  Tobacco Use   Smoking status: Never   Smokeless  tobacco: Never  Vaping Use   Vaping Use: Never used  Substance and Sexual Activity   Alcohol use: No   Drug use: No   Sexual activity: Not Currently    Birth control/protection: Condom  Other Topics Concern   Not on file  Social History Narrative   Lives with parents   Right handed   Caffeine: maybe 2 cups/day   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   Social Determinants of Health   Financial Resource Strain: Low Risk  (01/24/2021)   Overall Financial Resource Strain (CARDIA)    Difficulty of Paying Living Expenses: Not very hard  Food Insecurity: Food Insecurity Present (01/24/2021)   Hunger Vital Sign    Worried About Running Out of Food in the Last Year: Sometimes true    Ran Out of Food in the Last Year: Sometimes true  Transportation Needs: No Transportation Needs (01/24/2021)   PRAPARE - Hydrologist (Medical): No    Lack of Transportation (Non-Medical): No  Physical Activity: Insufficiently Active (01/24/2021)   Exercise Vital Sign    Days of Exercise per Week: 7 days    Minutes of Exercise per Session: 20 min  Stress: No Stress Concern Present  (01/24/2021)   [place]    Feeling of Stress : Only a little  Social Connections: Moderately Isolated (01/24/2021)   Social Connection and Isolation Panel [NHANES]    Frequency of Communication with Friends and Family: More than three times a week    Frequency of Social Gatherings with Friends and Family: More than three times a week    Attends Religious Services: 1 to 4 times per year    Active Member of [employer] or Organizations: No    Attends Music therapist: Never    Marital Status: Never married       Family History: Family History  Problem Relation Age of Onset   Pulmonary Hypertension Mother    Congestive Heart Failure Mother    Hypertension Sister    Obesity Sister    Migraines Neg Hx          Review of Systems  Constitutional: Negative.   HENT: Negative.    Eyes: Negative.   Respiratory: Negative.    Cardiovascular: Negative.   Gastrointestinal: Negative.   Genitourinary: Negative.   Musculoskeletal: Negative.   All other systems reviewed and are negative.  Objective: BP (!) 148/83   Pulse 81   Temp 98 F (36.7 C)   Ht '5\' 10"'$  (1.778 m)   Wt (!) 324 lb (147 kg)   LMP 11/08/2021 (Approximate)   SpO2 (!) 82%   BMI 46.49 kg/m  Body mass index is 46.49 kg/m. Physical Exam Vitals and nursing note reviewed.  Constitutional:      Appearance: Normal appearance. She is obese.  HENT:     Head: Normocephalic.     Right Ear: External ear normal.     Left Ear: External ear normal.     Nose: Nose normal.     Mouth/Throat:     Mouth: Mucous membranes are moist.     Pharynx: Oropharynx is clear.  Eyes:     Conjunctiva/sclera: Conjunctivae normal.  Cardiovascular:     Rate and Rhythm: Normal rate and regular rhythm.     Pulses: Normal pulses.     Heart sounds: Normal heart sounds.  Pulmonary:     Effort: Pulmonary effort is normal.     Breath sounds:  Normal breath sounds.   Abdominal:     General: Bowel sounds are normal.  Musculoskeletal:        General: Normal range of motion.  Skin:    General: Skin is warm.  Neurological:     Mental Status: She is alert and oriented to person, place, and time.  Psychiatric:        Mood and Affect: Mood normal.        Behavior: Behavior normal.    The plan was reviewed with the patient/family, and all questions/concerned were addressed.  It was my pleasure to see Vanessa [name] today and participate in her care. Please feel free to contact me with any questions or concerns.  Sincerely,  Jac Canavan NP [place]

## 2021-11-29 ENCOUNTER — Encounter: Payer: Self-pay | Admitting: Nutrition

## 2021-11-29 ENCOUNTER — Other Ambulatory Visit: Payer: Self-pay | Admitting: Nurse Practitioner

## 2021-11-29 LAB — CBC WITH DIFFERENTIAL/PLATELET
Basophils Absolute: 0 10*3/uL (ref 0.0–0.2)
Basos: 1 %
EOS (ABSOLUTE): 0.1 10*3/uL (ref 0.0–0.4)
Eos: 2 %
Hematocrit: 31.4 % — ABNORMAL LOW (ref 34.0–46.6)
Hemoglobin: 9.6 g/dL — ABNORMAL LOW (ref 11.1–15.9)
Immature Grans (Abs): 0 10*3/uL (ref 0.0–0.1)
Immature Granulocytes: 0 %
Lymphocytes Absolute: 2.2 10*3/uL (ref 0.7–3.1)
Lymphs: 28 %
MCH: 24.3 pg — ABNORMAL LOW (ref 26.6–33.0)
MCHC: 30.6 g/dL — ABNORMAL LOW (ref 31.5–35.7)
MCV: 80 fL (ref 79–97)
Monocytes Absolute: 0.6 10*3/uL (ref 0.1–0.9)
Monocytes: 7 %
Neutrophils Absolute: 4.9 10*3/uL (ref 1.4–7.0)
Neutrophils: 62 %
Platelets: 287 10*3/uL (ref 150–450)
RBC: 3.95 x10E6/uL (ref 3.77–5.28)
RDW: 14.6 % (ref 11.7–15.4)
WBC: 7.9 10*3/uL (ref 3.4–10.8)

## 2021-11-29 LAB — CMP14+EGFR
ALT: 15 IU/L (ref 0–32)
AST: 19 IU/L (ref 0–40)
Albumin/Globulin Ratio: 1.4 (ref 1.2–2.2)
Albumin: 4.2 g/dL (ref 3.9–4.9)
Alkaline Phosphatase: 64 IU/L (ref 44–121)
BUN/Creatinine Ratio: 11 (ref 9–23)
BUN: 9 mg/dL (ref 6–24)
Bilirubin Total: 0.4 mg/dL (ref 0.0–1.2)
CO2: 21 mmol/L (ref 20–29)
Calcium: 9.1 mg/dL (ref 8.7–10.2)
Chloride: 103 mmol/L (ref 96–106)
Creatinine, Ser: 0.85 mg/dL (ref 0.57–1.00)
Globulin, Total: 3 g/dL (ref 1.5–4.5)
Glucose: 95 mg/dL (ref 70–99)
Potassium: 3.9 mmol/L (ref 3.5–5.2)
Sodium: 137 mmol/L (ref 134–144)
Total Protein: 7.2 g/dL (ref 6.0–8.5)
eGFR: 86 mL/min/{1.73_m2} (ref >59)

## 2021-11-29 LAB — LIPID PANEL
Chol/HDL Ratio: 3.9 ratio (ref 0.0–4.4)
Cholesterol, Total: 162 mg/dL (ref 100–199)
HDL: 42 mg/dL (ref >39)
LDL Chol Calc (NIH): 98 mg/dL (ref 0–99)
Triglycerides: 124 mg/dL (ref 0–149)
VLDL Cholesterol Cal: 22 mg/dL (ref 5–40)

## 2021-11-29 MED ORDER — FERROUS SULFATE 325 (65 FE) MG PO TBEC: 325.0000 mg | DELAYED_RELEASE_TABLET | Freq: Every day | ORAL | 1 refills | Status: DC

## 2021-12-13 ENCOUNTER — Telehealth: Payer: Self-pay | Admitting: Family Medicine

## 2021-12-13 ENCOUNTER — Other Ambulatory Visit (HOSPITAL_COMMUNITY): Payer: Self-pay

## 2021-12-13 NOTE — Telephone Encounter (Signed)
Her botox PA needs to be renewed it will expire 10/26. Thanks!

## 2021-12-13 NOTE — Telephone Encounter (Signed)
Botox[employer] Benefit Verification [id number] Submitted

## 2021-12-14 ENCOUNTER — Other Ambulatory Visit (HOSPITAL_COMMUNITY): Payer: Self-pay

## 2021-12-14 NOTE — Telephone Encounter (Signed)
Patient Advocate Encounter   Received notification that prior authorization for Botox 200UNIT solution is required.   PA submitted on 12/14/2021 Key [id number] Status is pending       Lyndel Safe, [place] Patient Advocate Specialist [place] [facility] Patient Advocate Team Direct Number: [phone]  Fax: [phone]

## 2021-12-14 NOTE — Telephone Encounter (Signed)
   Botox[employer] benefits verification scanned to chart

## 2021-12-14 NOTE — Telephone Encounter (Signed)
Patient Advocate Encounter  Prior Authorization for Botox 200UNIT solution has been approved.    PA# [id number] Effective dates: 12/14/2021 through 03/16/2022  Can Be Filled at [place], [place] Patient Advocate Specialist [place] Patient Advocate Team Direct Number: [phone]  Fax: [phone]

## 2021-12-17 ENCOUNTER — Other Ambulatory Visit: Payer: Self-pay | Admitting: Nurse Practitioner

## 2021-12-17 ENCOUNTER — Encounter: Payer: Self-pay | Admitting: Nurse Practitioner

## 2021-12-17 DIAGNOSIS — M533 Sacrococcygeal disorders, not elsewhere classified: Secondary | ICD-10-CM

## 2021-12-18 NOTE — Progress Notes (Unsigned)
12/19/2021 ALL: Trudi returns for Botox. Migraines are well managed on Ajovy and Nurtec.Has more headaches at end of cycle. She continues Diamox '250mg'$  BID. 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   09/19/2021 ALL: Alben Spittle returns for Botox. Migraines remain well managed on Ajovy and Nurtec. 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 She was seen by Dr Jaynee Eagles 07/2021 and dose reduced to '125mg'$  BID with plans to increase to '250mg'$  BID. Referral to [facility] for shunt eval. CT/shunt series show shunt intact, valve pressure at 140. 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 She is schedule to return for venous angiogram with manometry next month. She is tolerating Diamox '125mg'$  BID. She has follow up with Jaynee Eagles next month.   06/25/2021 ALL: Simara returns for 3rd procedure. She continues Ajovy and [place]. She is doing well. 4-5 migraines on average. Nurtec works well.   03/26/2021 ALL: Octavia returns for 2nd Botox procedure. She continues Ajovy and Nurtec.  She reports over 50% reduction in migraine frequency and intensity. Now having 3-4 migrainous headaches per month. Migraines are easily aborted with Nurtec.   01/01/2021 ALL: Alexzandrea presents for her first Botox procedure. Baseline daily headaches with 8-12 migrainous headaches per month. She continues Ajovy for migraine prevention. Amitriptyline prescribed by PCP. Nurtec helps with abortive therapy.    Consent Form Botulism Toxin Injection For Chronic Migraine    Reviewed orally with patient, additionally signature is on file:  Botulism toxin has been approved by the Federal drug administration for treatment of chronic migraine. Botulism toxin does not cure chronic migraine and it may not be effective in some patients.  The administration of botulism toxin is accomplished by injecting a small amount of toxin into the muscles of the neck and  head. Dosage must be titrated for each individual. Any benefits resulting from botulism toxin tend to wear off after 3 months with a repeat injection required if benefit is to be maintained. Injections are usually done every 3-4 months with maximum effect peak achieved by about 2 or 3 weeks. Botulism toxin is expensive and you should be sure of what costs you will incur resulting from the injection.  The side effects of botulism toxin use for chronic migraine may include:   -Transient, and usually mild, facial weakness with facial injections  -Transient, and usually mild, head or neck weakness with head/neck injections  -Reduction or loss of forehead facial animation due to forehead muscle weakness  -Eyelid drooping  -Dry eye  -Pain at the site of injection or bruising at the site of injection  -Double vision  -Potential unknown long term risks   Contraindications: You should not have Botox if you are pregnant, nursing, allergic to albumin, have an infection, skin condition, or muscle weakness at the site of the injection, or have myasthenia gravis, Lambert-Eaton syndrome, or ALS.  It is also possible that as with any injection, there may be an allergic reaction or no effect from the medication. Reduced effectiveness after repeated injections is sometimes seen and rarely infection at the injection site may occur. All care will be taken to prevent these side effects. If therapy is given over a long time, atrophy and wasting in the muscle injected may occur. Occasionally the patient's become refractory to treatment because they develop antibodies to the toxin. In this event, therapy needs to be modified.  I have read the above information and consent  to the administration of botulism toxin.    BOTOX PROCEDURE NOTE FOR MIGRAINE HEADACHE  Contraindications and precautions discussed with patient(above). Aseptic procedure was observed and patient tolerated procedure. Procedure performed by [name], FNP-C.   The condition has existed for more than 6 months, and pt does not have a diagnosis of ALS, Myasthenia Gravis or Lambert-Eaton Syndrome.  Risks and benefits of injections discussed and pt agrees to proceed with the procedure.  Written consent obtained  These injections are medically necessary. Pt  receives good benefits from these injections. These injections do not cause sedations or hallucinations which the oral therapies may cause.   Description of procedure:  The patient was placed in a sitting position. The standard protocol was used for Botox as follows, with 5 units of Botox injected at each site:  -Procerus muscle, midline injection  -Corrugator muscle, bilateral injection  -Frontalis muscle, bilateral injection, with 2 sites each side, medial injection was performed in the upper one third of the frontalis muscle, in the region vertical from the medial inferior edge of the superior orbital rim. The lateral injection was again in the upper one third of the forehead vertically above the lateral limbus of the cornea, 1.5 cm lateral to the medial injection site.  -Temporalis muscle injection, 4 sites, bilaterally. The first injection was 3 cm above the tragus of the ear, second injection site was 1.5 cm to 3 cm up from the first injection site in line with the tragus of the ear. The third injection site was 1.5-3 cm forward between the first 2 injection sites. The fourth injection site was 1.5 cm posterior to the second injection site. 5th site laterally in the temporalis  muscleat the level of the outer canthus.  -Occipitalis muscle injection, 3 sites, bilaterally. The first injection was done one half way between the occipital protuberance and the tip of the mastoid process behind the ear. The second injection site was done lateral and superior to the first, 1 fingerbreadth from the first injection. The third injection site was 1 fingerbreadth superiorly and medially from the  first injection site.  -Cervical paraspinal muscle injection, 2 sites, bilaterally. The first injection site was 1 cm from the midline of the cervical spine, 3 cm inferior to the lower border of the occipital protuberance. The second injection site was 1.5 cm superiorly and laterally to the first injection site.  -Trapezius muscle injection was performed at 3 sites, bilaterally. The first injection site was in the upper trapezius muscle halfway between the inflection point of the neck, and the acromion. The second injection site was one half way between the acromion and the first injection site. The third injection was done between the first injection site and the inflection point of the neck.   Will return for repeat injection in 3 months.   A total of 200 units of Botox was prepared, 155 units of Botox was injected as documented above, 45 units of Botox was wasted. The patient tolerated the procedure well, there were no complications of the above procedure.

## 2021-12-19 ENCOUNTER — Ambulatory Visit (INDEPENDENT_AMBULATORY_CARE_PROVIDER_SITE_OTHER): Payer: 59 | Admitting: Family Medicine

## 2021-12-19 ENCOUNTER — Encounter: Payer: Self-pay | Admitting: Family Medicine

## 2021-12-19 DIAGNOSIS — G43709 Chronic migraine without aura, not intractable, without status migrainosus: Secondary | ICD-10-CM

## 2021-12-19 MED ORDER — AJOVY 225 MG/1.5ML ~~LOC~~ SOSY: 1.5000 mL | PREFILLED_SYRINGE | SUBCUTANEOUS | 3 refills | Status: DC

## 2021-12-19 MED ORDER — ONABOTULINUMTOXINA 200 UNITS IJ SOLR
155.0000 [IU] | Freq: Once | INTRAMUSCULAR | Status: AC
  Administered 2021-12-19: 155 [IU] via INTRAMUSCULAR

## 2021-12-19 NOTE — Progress Notes (Signed)
Botox- 200 units x 1 vial Lot: [id number] Expiration: 03/2024 NDC: 0023-3921-02  Bacteriostatic 0.9% Sodium Chloride- 4mL total Lot: [id number] Expiration: 10/27/2022 NDC: 0409-1966-02  Dx: G43.709 B/B  

## 2021-12-27 ENCOUNTER — Telehealth: Payer: Self-pay

## 2021-12-27 NOTE — Telephone Encounter (Signed)
Transition Care Management Follow-up Telephone Call Date of discharge and from where: 12/26/2021 [facility]  How have you been since you were released from the hospital? Patient states that she is doing well considering the surgery - she is in pain constantly - advised her to contact surgeon's office in regards to the pain.   Any questions or concerns? No  Items Reviewed: Did the pt receive and understand the discharge instructions provided? Yes  Medications obtained and verified? Yes  Other? Yes  Any new allergies since your discharge? No  Dietary orders reviewed? Yes Do you have support at home? Yes   Home Care and Equipment/Supplies: Were home health services ordered? not applicable If so, what is the name of the agency? na  Has the agency set up a time to come to the patient's home? not applicable Were any new equipment or medical supplies ordered?  No What is the name of the medical supply agency? na Were you able to get the supplies/equipment? not applicable Do you have any questions related to the use of the equipment or supplies? No  Functional Questionnaire: (I = Independent and D = Dependent) ADLs: D  Bathing/Dressing- D  Meal Prep- D  Eating- D  Maintaining continence- D  Transferring/Ambulation- D  Managing Meds- D  Follow up appointments reviewed:  PCP Hospital f/u appt confirmed?  Patient declines at this time - has follow up with surgeon scheduled    [facility] f/u appt confirmed? Yes  Scheduled to see neurosurgery - [facility] on 01/07/22 @ 1120am. Are transportation arrangements needed? No  If their condition worsens, is the pt aware to call PCP or go to the Emergency Dept.? Yes Was the patient provided with contact information for the PCP's office or ED? Yes Was to pt encouraged to call back with questions or concerns? Yes

## 2021-12-29 ENCOUNTER — Other Ambulatory Visit: Payer: Self-pay | Admitting: Family Medicine

## 2021-12-29 DIAGNOSIS — I10 Essential (primary) hypertension: Secondary | ICD-10-CM

## 2021-12-29 DIAGNOSIS — E1165 Type 2 diabetes mellitus with hyperglycemia: Secondary | ICD-10-CM

## 2021-12-29 DIAGNOSIS — E1169 Type 2 diabetes mellitus with other specified complication: Secondary | ICD-10-CM

## 2021-12-31 ENCOUNTER — Other Ambulatory Visit: Payer: Self-pay | Admitting: Nurse Practitioner

## 2021-12-31 DIAGNOSIS — M65332 Trigger finger, left middle finger: Secondary | ICD-10-CM

## 2022-01-03 ENCOUNTER — Other Ambulatory Visit: Payer: Self-pay | Admitting: Family Medicine

## 2022-01-03 DIAGNOSIS — I10 Essential (primary) hypertension: Secondary | ICD-10-CM

## 2022-01-09 ENCOUNTER — Ambulatory Visit: Payer: 59 | Admitting: Nutrition

## 2022-01-15 NOTE — Progress Notes (Signed)
Chief Complaint  Patient presents with   Follow-up    RM 11, alone. Had a shunt insertion, end up with DVT. On xarelto. Vanessa [name] stable.    HISTORY OF PRESENT ILLNESS:  01/21/22 ALL:  Vanessa Vanessa [name] returns for follow up for headaches. She is s/p rt lumboperitoneal shunt insertion 12/25/2021. Headaches were worse following surgery and shunt setting increased to 270m H20. Headaches are significantly better. She had right upper ext pain and swelling. UKoreapositive for DVT. She is now on Xarelto.   She continues Botox, Ajovy and acetazolamide '250mg'$  BID and Nurtec as needed. Vanessa [name] are well managed. She may have 3-4 per month. Nurtec works well.   07/09/2021 ALL: TMaizyreturns for follow up following initiation of Botox for Vanessa [name]. She continues Ajovy and Nurtec. Last procedure 06/25/2021. She feels that Vanessa [name] are much better. She has about 4-5 migraine days a month, previously reporting daily headaches with 8-12 Vanessa [name]. Nurtec works well for abortive therapy.   She reports more pressure headaches over the past few months. She feels that her head is going to explode at times. She reports blurred vision at times (with and without headache). No vision loss or TVO. She reports eye exam last month showed worsening optic nerve edema. She has follow up in 10/2021. BP has been better, usually between 135-145/80's. She is current on furosemide '40mg'$  daily and spironolactone '100mg'$  BID for peripheral edema. Also taking metoprolol daily.   She was diagnosed with alpha gal. She has eliminated milk and red meats from her diet. She is eating mostly chicken, [place] fish. She reports losing 10lbs. She has never worked with medical weight management. [place] improved from 10.2 to 8.2 over past 6 months.   11/30/2021 ALL:  Vanessa Vanessa [name]. [facility] Amitriptyline continued per PCP and Nurtec for abortive  therapy. She feels that she may be doing a little better on Ajovy. Nurtec works well for abortive therapy. No vision changes or vision loss, some blurred vision with headache. She is scheduled for eye exam next week. She reports that BP has been elevated at home (145/80-90). She relates this to being nervous about a recent biopsy with GYN. She found out yesterday everything was fine with biopsy. She has a hard time sleeping, since childhood. She is on zaleplon. She feels that she just lies awake at night. Usually only gets 4 hours of sleep each night. She reports CBGs are stable. Last A1C 7.0. She feels mood is good on fluoxetine.   From a thorough review of notes patient's been on multiple medications that can be used in iih and migraine management including acetazolamide (made her feels really bad), topiramate (made headaches worse), propranolol (can't remember but reports side effects), amitriptyline (on now), amlodipine, Benadryl, gabapentin, lisinopril, Zofran, promethazine, naratriptan, maxalt, sumatriptan  05/25/2020 ALL:  Vanessa Landgrebeis a 46y.o. female here today for follow up for headache follow up. 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 MRI 11/2019 was unremarkable. She was started on Emgality (Ajovy not approved).  Diamox and amitriptyline continued (prescribed by PCP). She reports that around 11/2019 she stopped Diamox due to reports of nausea. She was doing very well from 11/2019 until 04/2020. Over the past month she has noticed worsening headaches. She is having near daily dull, achy headaches in the frontal region.  She is having 1-2 Vanessa [name] a week. She reports smelling diesel fuel before during and after headache. She has nausea and blurred vision with headache. No vision loss in left eye. Blind in right eye. Nurtec helps most with abortive therapy.  A1C at goal. BP has been elevated. She feels that this is due to  headaches. She is taking Oxycodone '10mg'$  PRN (1-2 times daily) for knee pain. She has constipation. She reports neuro opthalmology exam was normal with no concerns of optic nerve edema about 2 months ago.   She was seen 3/25 in the ER for classic migraine aborted with migraine cocktail and Fioricet. CT was normal, stable position of shunt and decompressed ventricles.    HISTORY (copied from Dr Vanessa Vanessa [name] previous note)  HPI:  Vanessa Vanessa [name] is a 46 y.o. female here as requested by Vanessa Brooklyn, FNP for Vanessa [name].  Patient has a past medical history of IDIOPATHIC INTRACRANIAL HYPERTENSION,  congenital blindness OD, Vanessa [name], morbid obesity, hypertension, vision loss, PCOS, diabetes, hyperlipidemia, congenital blindness of right eye, OD glaucoma. 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  She has been evaluated in the past by Dr. Jola Vanessa [name]) as well as Dr. Octavia Bruckner [name](neuro-Ophthalmology) and now she sees a neuro- ophthalmologist elsewhere.   Last time she saw neurosurgery was in 2019 and at that time the shunt was in good working condition. Headaches improved. She is still getting Vanessa [name] however. Some days it feels like head is going to explode. She has not lost weight. She has daily  headaches and Vanessa [name] 2x a week. She wakes up with headaches, not using her cpap. Positional worsening headache, vision changes and blurry vision.The Vanessa [name] are throbbing, she may smell finny things prior to migraine. Vanessa [name] can last 3 days, severe, at least 15 migraine days a month for over a year, light/sound sensitivity, nausea, no vomiting. Daily headaches. Congenital blindness right eye. Headaches are worse in the morning and with position. No weakness, numbness. No other focal neurologic deficits, associated symptoms, inciting events or modifiable factors.  Last seen by neurosurgery in April 2019.   Reviewed notes, labs and imaging from outside physicians, which showed:   Reviewed imaging from January 2019 CT of the head, interval placement of a left frontal approach VP shunt catheter, slitlike appearance of the lateral and third ventricles.  Evidence of a prior right frontal burr hole site.  XR shunt series showed left VP shunt catheter tubing coursing along the left hemithorax and into the abdomen and terminates into the left lower quadrant, no shunt kinking or discontinuity.    From a thorough review of notes patient's been on multiple medications that can be used in iih and migraine management including acetazolamide, propranolol, amitriptyline, amlodipine, Benadryl, gabapentin, lisinopril, Zofran, promethazine,topamax, naratriptan, maxalt, sumatriptan    REVIEW OF SYSTEMS: Out of a complete 14 system review of symptoms, the  patient complains only of the following symptoms, headaches, chronic knee pain, blurred vision, and all other reviewed systems are negative.   ALLERGIES: Allergies  Allergen Reactions   Metrizamide Other (See Comments), Itching, Photosensitivity, Shortness Of Breath and Swelling    update Migraine instantly   Mushroom Extract Complex Itching    Throat itching with cough   Peanut Oil Anaphylaxis and Other (See Comments)    update   Peanut-Containing Drug  Products Itching    Itching throat with cough Itching throat with cough   Diflucan In Dextrose [Fluconazole In Dextrose] Hives   Iodinated Contrast Media    Iodine-131 Other (See Comments)    update     HOME MEDICATIONS: Outpatient Medications Prior to Visit  Medication Sig Dispense Refill   acetaZOLAMIDE (DIAMOX) 125 MG tablet Take 2 tablets (250 mg total) by mouth 2 (two) times daily. 60 tablet 11   amitriptyline (ELAVIL) 50 MG tablet TAKE 1 TABLET BY MOUTH EVERY DAY 90 tablet 0   atorvastatin (LIPITOR) 80 MG tablet TAKE 1 TABLET BY MOUTH EVERY DAY 90 tablet 0   cyclobenzaprine (FLEXERIL) 10 MG tablet TAKE 1 TABLET BY MOUTH THREE TIMES A DAY AS NEEDED FOR MUSCLE SPASMS 60 tablet 2   empagliflozin (JARDIANCE) 25 MG TABS tablet Take 1 tablet (25 mg total) by mouth daily before breakfast. 90 tablet 1   EPINEPHrine (EPIPEN 2-PAK) 0.3 mg/0.3 mL IJ SOAJ injection Inject 0.3 mg into the muscle as needed for anaphylaxis. 2 each 1   ferrous sulfate (FE TABS) 325 (65 FE) MG EC tablet Take 1 tablet (325 mg total) by mouth daily with breakfast. 60 tablet 1   flunisolide (NASALIDE) 25 MCG/ACT (0.025%) SOLN Instill 2 sprays into each nostril twice daily. 25 mL 0   Fremanezumab-vfrm (AJOVY) 225 MG/1.5ML SOSY Inject 1.5 mLs into the skin every 30 (thirty) days. 4.5 mL 3   furosemide (LASIX) 40 MG tablet TAKE 1 TABLET BY MOUTH EVERY DAY 90 tablet 0   glucose blood test strip Use to test sugar daily. Dx E11.65 100 each 2   lisinopril (ZESTRIL) 40 MG tablet Take 1 tablet by mouth daily. 90 tablet 0   loratadine (CLARITIN) 10 MG tablet Take 1 tablet by mouth daily. 90 tablet 1   megestrol (MEGACE) 40 MG tablet TAKE 3 TABLETS EVERY DAY FOR 5 DAYS TAKE 2 TABLETS EVERY DAY FOR 5 DAYS TAKE 1 TABLET EVERY DAY 135 tablet 1   metoprolol succinate (TOPROL-XL) 25 MG 24 hr tablet TAKE 1 TABLET (25 MG TOTAL) BY MOUTH DAILY. 90 tablet 0   montelukast (SINGULAIR) 10 MG tablet Take 1 tablet by mouth daily at bedtime. 90  tablet 1   Multiple Vitamin (MULTIVITAMIN) tablet Take 1 tablet by mouth daily.     naproxen (NAPROSYN) 500 MG tablet TAKE 1 TABLET BY MOUTH TWICE A DAY 60 tablet 2   OneTouch Delica Lancets 33I MISC 1 each by Does not apply route daily. Dx E11.9 100 each 3   Oxycodone HCl 10 MG TABS Take 1 tablet (10 mg total) by mouth 2 (two) times daily as needed. 60 tablet 0   Oxycodone HCl 10 MG TABS Take 1 tablet (10 mg total) by mouth 2 (two) times daily as needed. 60 tablet 0   Oxycodone HCl 10 MG TABS Take 1 tablet (10 mg total) by mouth 2 (two) times daily as needed. 60 tablet 0   promethazine-dextromethorphan (PROMETHAZINE-DM) 6.25-15 MG/5ML syrup Take 5 mLs by mouth 4 (four) times daily as  needed for cough. 240 mL 0   Rivaroxaban (XARELTO) 15 MG TABS tablet Take 15 mg by mouth 2 (two) times daily with a meal.     Semaglutide, 2 MG/DOSE, 8 MG/3ML SOPN Inject 2 mg as directed once a week. 9 mL 1   spironolactone (ALDACTONE) 100 MG tablet Take 1 tablet by mouth twice daily. 180 tablet 0   traZODone (DESYREL) 50 MG tablet Take 1-2 tablets (50-100 mg total) by mouth at bedtime as needed. for sleep 180 tablet 1   Rimegepant Sulfate (NURTEC) 75 MG TBDP Take 75 mg by mouth daily as needed. Onset migraine (max one daily) 10 tablet 5   No facility-administered medications prior to visit.     PAST MEDICAL HISTORY: 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   Hypertension    Vanessa [name]    Pseudotumor (inflammatory) of orbit    Sepsis ([place]) 02/2019   Sleep apnea    Vaginal Pap smear, abnormal    Vision loss      PAST SURGICAL HISTORY: Past Surgical History:  Procedure Laterality Date   BRAIN SURGERY     CSF SHUNT       FAMILY HISTORY: Family History  Problem Relation Age of Onset   Pulmonary Hypertension Mother    Congestive Heart Failure Mother    Hypertension Sister    Obesity Sister    Vanessa [name] Neg Hx      SOCIAL HISTORY: Social History    Socioeconomic History   Marital status: Single    Spouse name: Not on file   Number of children: 0   Years of education: Not on file   Highest education level: Not on file  Occupational History   Occupation: disability  Tobacco Use   Smoking status: Never   Smokeless tobacco: Never  Vaping Use   Vaping Use: Never used  Substance and Sexual Activity   Alcohol use: No   Drug use: No   Sexual activity: Not Currently    Birth control/protection: Condom  Other Topics Concern   Not on file  Social History Narrative   Lives with parents   Right handed   Caffeine: maybe 2 cups/day   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   Social Determinants of Health   Financial Resource Strain: Low Risk  (01/24/2021)   Overall Financial Resource Strain (CARDIA)    Difficulty of Paying Living Expenses: Not very hard  Food Insecurity: Food Insecurity Present (01/24/2021)   Hunger Vital Sign    Worried About Running Out of Food in the Last Year: Sometimes true    Ran Out of Food in the Last Year: Sometimes true  Transportation Needs: No Transportation Needs (01/24/2021)   PRAPARE - Hydrologist (Medical): No    Lack of Transportation (Non-Medical): No  Physical Activity: Insufficiently Active (01/24/2021)   Exercise Vital Sign    Days of Exercise per Week: 7 days    Minutes of Exercise per Session: 20 min  Stress: No Stress Concern Present (01/24/2021)   [place]    Feeling of Stress : Only a little  Social Connections: Moderately Isolated (01/24/2021)   Social Connection and Isolation Panel [NHANES]    Frequency of Communication with Friends and Family: More than three times a week    Frequency of Social Gatherings with Friends and Family: More than three times a week    Attends Religious Services:  1 to 4 times per year    Active Member of Clubs or Organizations: No    Attends Archivist  Meetings: Never    Marital Status: Never married  Intimate Partner Violence: Not At Risk (01/24/2021)   Humiliation, Afraid, Rape, and Kick questionnaire    Fear of Current or Ex-Partner: No    Emotionally Abused: No    Physically Abused: No    Sexually Abused: No      PHYSICAL EXAM  Vitals:   01/21/22 1017  BP: (!) 144/84  Pulse: 88  Weight: (!) 323 lb (146.5 kg)  Height: '5\' 10"'$  (1.778 m)      Body mass index is 46.35 kg/m.   Generalized: Well developed, in no acute distress  Cardiology: normal rate and rhythm, no murmur auscultated  Respiratory: clear to auscultation bilaterally    Neurological examination  Mentation: Alert oriented to time, place, history taking. Follows all commands speech and language fluent Cranial nerve II-XII: Pupils were equal round reactive to light. Extraocular movements were full of left eye, visual field were full on confrontational test. Congenital blindness of right eye, outward deviation. Facial sensation and strength were normal. Head turning and shoulder shrug  were normal and symmetric. Motor: The motor testing reveals 5 over 5 strength of all 4 extremities. Good symmetric motor tone is noted throughout.  Gait and station: Gait is normal.     DIAGNOSTIC DATA (LABS, IMAGING, TESTING) - I reviewed patient records, labs, notes, testing and imaging myself where available.  Lab Results  Component Value Date   WBC 7.9 11/28/2021   HGB 9.6 (L) 11/28/2021   HCT 31.4 (L) 11/28/2021   MCV 80 11/28/2021   PLT 287 11/28/2021      Component Value Date/Time   NA 137 11/28/2021 1259   K 3.9 11/28/2021 1259   CL 103 11/28/2021 1259   CO2 21 11/28/2021 1259   GLUCOSE 95 11/28/2021 1259   BUN 9 11/28/2021 1259   CREATININE 0.85 11/28/2021 1259   CALCIUM 9.1 11/28/2021 1259   PROT 7.2 11/28/2021 1259   ALBUMIN 4.2 11/28/2021 1259   AST 19 11/28/2021 1259   ALT 15 11/28/2021 1259   ALKPHOS 64 11/28/2021 1259   BILITOT 0.4 11/28/2021  1259   GFRNONAA 79 04/06/2020 1329   GFRAA 91 04/06/2020 1329   Lab Results  Component Value Date   CHOL 162 11/28/2021   HDL 42 11/28/2021   LDLCALC 98 11/28/2021   TRIG 124 11/28/2021   CHOLHDL 3.9 11/28/2021   Lab Results  Component Value Date   HGBA1C 6.2 (H) 11/28/2021   No results found for: "VITAMINB12" No results found for: "TSH"      No data to display               No data to display           ASSESSMENT AND PLAN  46 y.o. year old female  has a past medical history of Allergy, Allergy to alpha-gal (05/21/2021), Hypertension, Vanessa [name], Pseudotumor (inflammatory) of orbit, Sepsis ([place]) (02/2019), Sleep apnea, Vaginal Pap smear, abnormal, and Vision loss. here with   S/P ventriculoperitoneal shunt  IIH (idiopathic intracranial hypertension)  Chronic migraine without aura without status migrainosus, not intractable  Other migraine without status migrainosus, not intractable - Plan: Rimegepant Sulfate (NURTEC) 75 MG TBDP  Vanessa Vanessa [name] is doing much better from a migraine perspective. Previously having daily headaches with at least 8-12 migrainous days. Now reports 3-4 migraine days per month.  She reports pressure headaches are improving post shunt insertion. She will continue Botox q12w, Ajovy every month and acetazolamide '250mg'$  BID for prevention. Amitriptyline per PCP recommendation. We will continue Nurtec for abortive therapy. Healthy lifestyle habits encouraged. She will monitor BP closely. She will follow up in 1 year for office visit, every 12 weeks for Botox. She verbalizes understanding and agreement with this plan.   No orders of the defined types were placed in this encounter.     Meds ordered this encounter  Medications   Rimegepant Sulfate (NURTEC) 75 MG TBDP    Sig: Take 75 mg by mouth daily as needed. Onset migraine (max one daily)    Dispense:  10 tablet    Refill:  11    Order Specific Question:   Supervising Provider    Answer:   Melvenia Beam [[id number]]     I spent 30 minutes of face-to-face and non-face-to-face time with patient.  This included previsit chart review, lab review, study review, order entry, electronic health record documentation, patient education.   [name], MSN, FNP-C 01/21/2022, 11:25 AM  [facility] [address] [place], [place] [address] [phone]

## 2022-01-15 NOTE — Patient Instructions (Addendum)
Below is our plan:  We will continue Botox, Ajovy, acetazolamide '250mg'$  twice daily and Nurtec as needed.   Please make sure you are staying well hydrated. I recommend 50-60 ounces daily. Well balanced diet and regular exercise encouraged. Consistent sleep schedule with 6-8 hours recommended.   Please continue follow up with care team as directed.   Follow up with me in 1 year  You may receive a survey regarding today's visit. I encourage you to leave honest feed back as I do use this information to improve patient care. Thank you for seeing me today!

## 2022-01-21 ENCOUNTER — Ambulatory Visit (INDEPENDENT_AMBULATORY_CARE_PROVIDER_SITE_OTHER): Payer: 59 | Admitting: Family Medicine

## 2022-01-21 ENCOUNTER — Encounter: Payer: Self-pay | Admitting: Family Medicine

## 2022-01-21 VITALS — BP 144/84 | HR 88 | Ht 70.0 in | Wt 323.0 lb

## 2022-01-21 DIAGNOSIS — G43709 Chronic migraine without aura, not intractable, without status migrainosus: Secondary | ICD-10-CM

## 2022-01-21 DIAGNOSIS — G43809 Other migraine, not intractable, without status migrainosus: Secondary | ICD-10-CM | POA: Diagnosis not present

## 2022-01-21 DIAGNOSIS — G932 Benign intracranial hypertension: Secondary | ICD-10-CM | POA: Diagnosis not present

## 2022-01-21 DIAGNOSIS — Z982 Presence of cerebrospinal fluid drainage device: Secondary | ICD-10-CM

## 2022-01-21 MED ORDER — NURTEC 75 MG PO TBDP: 75.0000 mg | ORAL_TABLET | Freq: Every day | ORAL | 11 refills | Status: AC | PRN

## 2022-01-25 ENCOUNTER — Ambulatory Visit (INDEPENDENT_AMBULATORY_CARE_PROVIDER_SITE_OTHER): Payer: 59

## 2022-01-25 VITALS — Ht 70.0 in | Wt 322.0 lb

## 2022-01-25 DIAGNOSIS — Z0000 Encounter for general adult medical examination without abnormal findings: Secondary | ICD-10-CM | POA: Diagnosis not present

## 2022-01-25 DIAGNOSIS — Z1231 Encounter for screening mammogram for malignant neoplasm of breast: Secondary | ICD-10-CM

## 2022-01-25 NOTE — Progress Notes (Signed)
Subjective:   Vanessa [name] is a 46 y.o. female who presents for Medicare Annual (Subsequent) preventive examination. I connected with  Carmela Hurt on 01/25/22 by a audio enabled telemedicine application and verified that I am speaking with the correct person using two identifiers.  Patient Location: Home  Provider Location: Home Office  I discussed the limitations of evaluation and management by telemedicine. The patient expressed understanding and agreed to proceed.  Review of Systems     Cardiac Risk Factors include: advanced age (>52mn, >>38women);diabetes mellitus;hypertension;sedentary lifestyle     Objective:    Today's Vitals   01/25/22 1015  Weight: (!) 322 lb (146.1 kg)  Height: _0  (1.778 m)   Body mass index is 46.2 kg/m.     01/24/2021   10:51 AM  Advanced Directives  Does Patient Have a Medical Advance Directive? No  Would patient like information on creating a medical advance directive? No - Patient declined    Current Medications (verified) Outpatient Encounter Medications as of 01/25/2022  Medication Sig   acetaZOLAMIDE (DIAMOX) 125 MG tablet Take 2 tablets (250 mg total) by mouth 2 (two) times daily.   amitriptyline (ELAVIL) 50 MG tablet TAKE 1 TABLET BY MOUTH EVERY DAY   atorvastatin (LIPITOR) 80 MG tablet TAKE 1 TABLET BY MOUTH EVERY DAY   cyclobenzaprine (FLEXERIL) 10 MG tablet TAKE 1 TABLET BY MOUTH THREE TIMES A DAY AS NEEDED FOR MUSCLE SPASMS   empagliflozin (JARDIANCE) 25 MG TABS tablet Take 1 tablet (25 mg total) by mouth daily before breakfast.   EPINEPHrine (EPIPEN 2-PAK) 0.3 mg/0.3 mL IJ SOAJ injection Inject 0.3 mg into the muscle as needed for anaphylaxis.   ferrous sulfate (FE TABS) 325 (65 FE) MG EC tablet Take 1 tablet (325 mg total) by mouth daily with breakfast.   flunisolide (NASALIDE) 25 MCG/ACT (0.025%) SOLN Instill 2 sprays into each nostril twice daily.   Fremanezumab-vfrm (AJOVY) 225 MG/1.5ML SOSY Inject 1.5 mLs into the  skin every 30 (thirty) days.   furosemide (LASIX) 40 MG tablet TAKE 1 TABLET BY MOUTH EVERY DAY   glucose blood test strip Use to test sugar daily. Dx E11.65   lisinopril (ZESTRIL) 40 MG tablet Take 1 tablet by mouth daily.   loratadine (CLARITIN) 10 MG tablet Take 1 tablet by mouth daily.   megestrol (MEGACE) 40 MG tablet TAKE 3 TABLETS EVERY DAY FOR 5 DAYS TAKE 2 TABLETS EVERY DAY FOR 5 DAYS TAKE 1 TABLET EVERY DAY   metoprolol succinate (TOPROL-XL) 25 MG 24 hr tablet TAKE 1 TABLET (25 MG TOTAL) BY MOUTH DAILY.   montelukast (SINGULAIR) 10 MG tablet Take 1 tablet by mouth daily at bedtime.   Multiple Vitamin (MULTIVITAMIN) tablet Take 1 tablet by mouth daily.   naproxen (NAPROSYN) 500 MG tablet TAKE 1 TABLET BY MOUTH TWICE A DAY   OneTouch Delica Lancets 322LMISC 1 each by Does not apply route daily. Dx E11.9   Oxycodone HCl 10 MG TABS Take 1 tablet (10 mg total) by mouth 2 (two) times daily as needed.   Oxycodone HCl 10 MG TABS Take 1 tablet (10 mg total) by mouth 2 (two) times daily as needed.   Oxycodone HCl 10 MG TABS Take 1 tablet (10 mg total) by mouth 2 (two) times daily as needed.   promethazine-dextromethorphan (PROMETHAZINE-DM) 6.25-15 MG/5ML syrup Take 5 mLs by mouth 4 (four) times daily as needed for cough.   Rimegepant Sulfate (NURTEC) 75 MG TBDP Take 75 mg by mouth daily  as needed. Onset migraine (max one daily)   Rivaroxaban (XARELTO) 15 MG TABS tablet Take 15 mg by mouth 2 (two) times daily with a meal.   Semaglutide, 2 MG/DOSE, 8 MG/3ML SOPN Inject 2 mg as directed once a week.   spironolactone (ALDACTONE) 100 MG tablet Take 1 tablet by mouth twice daily.   traZODone (DESYREL) 50 MG tablet Take 1-2 tablets (50-100 mg total) by mouth at bedtime as needed. for sleep   No facility-administered encounter medications on file as of 01/25/2022.    Allergies (verified) Metrizamide, Mushroom extract complex, Peanut oil, Peanut-containing drug products, Diflucan in dextrose  [fluconazole in dextrose], Iodinated contrast media, and Iodine-131   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   Hypertension    Migraines    Pseudotumor (inflammatory) of orbit    Sepsis ([place]) 02/2019   Sleep apnea    Vaginal Pap smear, abnormal    Vision loss    Past Surgical History:  Procedure Laterality Date   BRAIN SURGERY     CSF SHUNT     Family History  Problem Relation Age of Onset   Pulmonary Hypertension Mother    Congestive Heart Failure Mother    Hypertension Sister    Obesity Sister    Migraines Neg Hx    Social History   Socioeconomic History   Marital status: Single    Spouse name: Not on file   Number of children: 0   Years of education: Not on file   Highest education level: Not on file  Occupational History   Occupation: disability  Tobacco Use   Smoking status: Never   Smokeless tobacco: Never  Vaping Use   Vaping Use: Never used  Substance and Sexual Activity   Alcohol use: No   Drug use: No   Sexual activity: Not Currently    Birth control/protection: Condom  Other Topics Concern   Not on file  Social History Narrative   Lives with parents   Right handed   Caffeine: maybe 2 cups/day   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   Social Determinants of Health   Financial Resource Strain: Low Risk  (01/25/2022)   Overall Financial Resource Strain (CARDIA)    Difficulty of Paying Living Expenses: Not hard at all  Food Insecurity: No Food Insecurity (01/25/2022)   Hunger Vital Sign    Worried About Running Out of Food in the Last Year: Never true    Ran Out of Food in the Last Year: Never true  Transportation Needs: No Transportation Needs (01/25/2022)   PRAPARE - Hydrologist (Medical): No    Lack of Transportation (Non-Medical): No  Physical Activity: Inactive (01/25/2022)   Exercise Vital Sign    Days of Exercise per Week: 0 days    Minutes of Exercise per Session:  0 min  Stress: No Stress Concern Present (01/25/2022)   [place]    Feeling of Stress : Not at all  Social Connections: Moderately Isolated (01/25/2022)   Social Connection and Isolation Panel [NHANES]    Frequency of Communication with Friends and Family: More than three times a week    Frequency of Social Gatherings with Friends and Family: More than three times a week    Attends Religious Services: More than 4 times per year    Active Member of [employer] or Organizations: No    Attends Club or  Organization Meetings: Never    Marital Status: Never married    Tobacco Counseling Counseling given: Not Answered   Clinical Intake:  Pre-visit preparation completed: Yes  Pain : No/denies pain     Nutritional Risks: None Diabetes: No  How often do you need to have someone help you when you read instructions, pamphlets, or other written materials from your doctor or pharmacy?: 1 - Never  Diabetic?yes  Nutrition Risk Assessment:  Has the patient had any N/V/D within the last 2 months?  No  Does the patient have any non-healing wounds?  No  Has the patient had any unintentional weight loss or weight gain?  No   Diabetes:  Is the patient diabetic?  Yes  If diabetic, was a CBG obtained today?  No  Did the patient bring in their glucometer from home?  No  How often do you monitor your CBG's? 2 x day .   Financial Strains and Diabetes Management:  Are you having any financial strains with the device, your supplies or your medication? No .  Does the patient want to be seen by Chronic Care Management for management of their diabetes?  No  Would the patient like to be referred to a Nutritionist or for Diabetic Management?  No   Diabetic Exams:  Diabetic Eye Exam: Completed 02/2021 Diabetic Foot Exam: Overdue, Pt has been advised about the importance in completing this exam. Pt is scheduled for diabetic foot exam on  next office visit .   Interpreter Needed?: No  Information entered by :: Jadene Pierini, LPN   Activities of Daily Living    01/25/2022   10:19 AM  In your present state of health, do you have any difficulty performing the following activities:  Hearing? 0  Vision? 0  Difficulty concentrating or making decisions? 0  Walking or climbing stairs? 0  Dressing or bathing? 0  Doing errands, shopping? 0  Preparing Food and eating ? N  Using the Toilet? N  In the past six months, have you accidently leaked urine? N  Do you have problems with loss of bowel control? N  Managing your Medications? N  Managing your Finances? N  Housekeeping or managing your Housekeeping? N    Patient Care Team: Ivy Lynn, NP as PCP - General (Nurse Practitioner)  Indicate any recent Medical Services you may have received from other than [facility] providers in the past year (date may be approximate).     Assessment:   This is a routine wellness examination for Vanessa [name].  Hearing/Vision screen Vision Screening - Comments:: Wears rx glasses - up to date with routine eye exams with  [facility]  Dietary issues and exercise activities discussed: Current Exercise Habits: Home exercise routine;The patient does not participate in regular exercise at present, Exercise limited by: neurologic condition(s)   Goals Addressed             This Visit's Progress    Achieve a Healthy Weight-Obesity   On track    Timeframe:  Long-Range Goal Priority:  High Start Date:                             Expected End Date:                       Follow Up Date 01/25/2022    - drink 6 to 8 glasses of water each day - eat 3 to  5 servings of fruits and vegetables each day - eat 5 or 6 small meals each day - join a weight loss program - manage portion size - set goal weight         Depression Screen    01/25/2022   10:18 AM 11/28/2021   12:30 PM 11/26/2021    9:38 AM 08/21/2021   10:13 AM 05/17/2021   10:25 AM  02/14/2021   11:39 AM 01/24/2021   10:53 AM  PHQ 2/9 Scores  PHQ - 2 Score 0 0 0 0 1 0 0  PHQ- 9 Score    _0 Fall Risk    01/25/2022   10:16 AM 11/28/2021   12:30 PM 11/28/2021   12:06 PM 11/26/2021    9:38 AM 08/21/2021   10:13 AM  [place] in the past year? 0 0 0 0 0  Number falls in past yr: 0 0  0   Injury with Fall? 0 0  0   Risk for fall due to : No Fall Risks      Follow up Falls prevention discussed        [place]:  Any stairs in or around the home? No  If so, are there any without handrails? No  Home free of loose throw rugs in walkways, pet beds, electrical cords, etc? Yes  Adequate lighting in your home to reduce risk of falls? Yes   ASSISTIVE DEVICES UTILIZED TO PREVENT FALLS:  Life alert? No  Use of a cane, walker or w/c? No  Grab bars in the bathroom? No  Shower chair or bench in shower? No  Elevated toilet seat or a handicapped toilet? No          01/25/2022   10:19 AM 01/24/2021   10:42 AM  6CIT Screen  What Year? 0 points 0 points  What month? 0 points 0 points  What time? 0 points 0 points  Count back from 20 0 points 0 points  Months in reverse 0 points 0 points  Repeat phrase 0 points 2 points  Total Score 0 points 2 points    Immunizations Immunization History  Administered Date(s) Administered   Influenza Inj Mdck Quad Pf 02/14/2016   Influenza,inj,Quad PF,6+ Mos 04/28/2017   Influenza,inj,quad, With Preservative 02/14/2016   Influenza-Unspecified 02/14/2016, 02/14/2016, 11/04/2019, 11/25/2020   Moderna Sars-Covid-2 Vaccination 05/21/2019, 06/25/2019, 02/14/2020, 12/27/2020   Pneumococcal Polysaccharide-23 04/18/2016   Tdap 02/25/2014    TDAP status: Up to date  Flu Vaccine status: Due, Education has been provided regarding the importance of this vaccine. Advised may receive this vaccine at local pharmacy or Health Dept. Aware to provide a copy of the vaccination record if obtained  from local pharmacy or Health Dept. Verbalized acceptance and understanding.  Pneumococcal vaccine status: Up to date  Covid-19 vaccine status: Completed vaccines  Qualifies for Shingles Vaccine? Yes   Zostavax completed No   Shingrix Completed?: No.    Education has been provided regarding the importance of this vaccine. Patient has been advised to call insurance company to determine out of pocket expense if they have not yet received this vaccine. Advised may also receive vaccine at local pharmacy or Health Dept. Verbalized acceptance and understanding.  Screening Tests Health Maintenance  Topic Date Due   OPHTHALMOLOGY EXAM  02/07/2021   COLONOSCOPY (Pts 45-12yr Insurance coverage will need to be confirmed)  Never done   MAMMOGRAM  08/03/2021  COVID-19 Vaccine (5 - 2023-24 season) 10/26/2021   INFLUENZA VACCINE  05/26/2022 (Originally 09/25/2021)   Diabetic kidney evaluation - Urine ACR  05/18/2022   FOOT EXAM  05/18/2022   HEMOGLOBIN A1C  05/30/2022   Diabetic kidney evaluation - GFR measurement  11/29/2022   Medicare Annual Wellness (AWV)  01/26/2023   PAP SMEAR-Modifier  10/05/2023   DTaP/Tdap/Td (2 - Td or Tdap) 02/26/2024   Hepatitis C Screening  Completed   HIV Screening  Completed   HPV VACCINES  Aged Out    Health Maintenance  Health Maintenance Due  Topic Date Due   OPHTHALMOLOGY EXAM  02/07/2021   COLONOSCOPY (Pts 45-46yr Insurance coverage will need to be confirmed)  Never done   MAMMOGRAM  08/03/2021   COVID-19 Vaccine (5 - 2023-24 season) 10/26/2021    Colorectal cancer screening: Referral to GI placed per patient has Cologuard . Pt aware the office will call re: appt.  Mammogram status: Ordered patient to schedule . Pt provided with contact info and advised to call to schedule appt.   Bone Density status: Ordered not of age . Pt provided with contact info and advised to call to schedule appt.  Lung Cancer Screening: (Low Dose CT Chest recommended if Age  45-80years, 30 pack-year currently smoking OR have quit w/in 15years.) does not qualify.   Lung Cancer Screening Referral: n/a   Additional Screening:  Hepatitis C Screening: does not qualify;   Vision Screening: Recommended annual ophthalmology exams for early detection of glaucoma and other disorders of the eye. Is the patient up to date with their annual eye exam?  Yes  Who is the provider or what is the name of the office in which the patient attends annual eye exams? [facility] If pt is not established with a provider, would they like to be referred to a provider to establish care? No .   Dental Screening: Recommended annual dental exams for proper oral hygiene  Community Resource Referral / Chronic Care Management: CRR required this visit?  No   CCM required this visit?  No      Plan:     I have personally reviewed and noted the following in the patient's chart:   Medical and social history Use of alcohol, tobacco or illicit drugs  Current medications and supplements including opioid prescriptions. Patient is currently taking opioid prescriptions. Information provided to patient regarding non-opioid alternatives. Patient advised to discuss non-opioid treatment plan with their provider. Functional ability and status Nutritional status Physical activity Advanced directives List of other physicians Hospitalizations, surgeries, and ER visits in previous 12 months Vitals Screenings to include cognitive, depression, and falls Referrals and appointments  In addition, I have reviewed and discussed with patient certain preventive protocols, quality metrics, and best practice recommendations. A written personalized care plan for preventive services as well as general preventive health recommendations were provided to patient.     LDaphane Shepherd LPN   [id number]  Nurse Notes: Patient waiting on Cologuard kit

## 2022-01-25 NOTE — Patient Instructions (Signed)
Ms. Vanessa [name] , Thank you for taking time to come for your Medicare Wellness Visit. I appreciate your ongoing commitment to your health goals. Please review the following plan we discussed and let me know if I can assist you in the future.   These are the goals we discussed:  Goals      Achieve a Healthy Weight-Obesity     Timeframe:  Long-Range Goal Priority:  High Start Date:                             Expected End Date:                       Follow Up Date 01/25/2022    - drink 6 to 8 glasses of water each day - eat 3 to 5 servings of fruits and vegetables each day - eat 5 or 6 small meals each day - join a weight loss program - manage portion size - set goal weight          This is a list of the screening recommended for you and due dates:  Health Maintenance  Topic Date Due   Eye exam for diabetics  02/07/2021   Colon Cancer Screening  Never done   Mammogram  08/03/2021   COVID-19 Vaccine (5 - 2023-24 season) 10/26/2021   Flu Shot  05/26/2022*   Yearly kidney health urinalysis for diabetes  05/18/2022   Complete foot exam   05/18/2022   Hemoglobin A1C  05/30/2022   Yearly kidney function blood test for diabetes  11/29/2022   Medicare Annual Wellness Visit  01/26/2023   Pap Smear  10/05/2023   DTaP/Tdap/Td vaccine (2 - Td or Tdap) 02/26/2024   Hepatitis C Screening: USPSTF Recommendation to screen - Ages 50-79 yo.  Completed   HIV Screening  Completed   HPV Vaccine  Aged Out  *Topic was postponed. The date shown is not the original due date.    Advanced directives: Advance directive discussed with you today. I have provided a copy for you to complete at home and have notarized. Once this is complete please bring a copy in to our office so we can scan it into your chart.   Conditions/risks identified: Aim for 30 minutes of exercise or brisk walking, 6-8 glasses of water, and 5 servings of fruits and vegetables each day.   Next appointment: Follow up in one year for  your annual wellness visit    Preventive Care 65 Years and Older, Female Preventive care refers to lifestyle choices and visits with your health care provider that can promote health and wellness. What does preventive care include? A yearly physical exam. This is also called an annual well check. Dental exams once or twice a year. Routine eye exams. Ask your health care provider how often you should have your eyes checked. Personal lifestyle choices, including: Daily care of your teeth and gums. Regular physical activity. Eating a healthy diet. Avoiding tobacco and drug use. Limiting alcohol use. Practicing safe sex. Taking low-dose aspirin every day. Taking vitamin and mineral supplements as recommended by your health care provider. What happens during an annual well check? The services and screenings done by your health care provider during your annual well check will depend on your age, overall health, lifestyle risk factors, and family history of disease. Counseling  Your health care provider may ask you questions about your: Alcohol use. Tobacco use. Drug  use. Emotional well-being. Home and relationship well-being. Sexual activity. Eating habits. History of falls. Memory and ability to understand (cognition). Work and work Statistician. Reproductive health. Screening  You may have the following tests or measurements: Height, weight, and BMI. Blood pressure. Lipid and cholesterol levels. These may be checked every 5 years, or more frequently if you are over 79 years old. Skin check. Lung cancer screening. You may have this screening every year starting at age 43 if you have a 30-pack-year history of smoking and currently smoke or have quit within the past 15 years. Fecal occult blood test (FOBT) of the stool. You may have this test every year starting at age 52. Flexible sigmoidoscopy or colonoscopy. You may have a sigmoidoscopy every 5 years or a colonoscopy every 10  years starting at age 17. Hepatitis C blood test. Hepatitis B blood test. Sexually transmitted disease (STD) testing. Diabetes screening. This is done by checking your blood sugar (glucose) after you have not eaten for a while (fasting). You may have this done every 1-3 years. Bone density scan. This is done to screen for osteoporosis. You may have this done starting at age 69. Mammogram. This may be done every 1-2 years. Talk to your health care provider about how often you should have regular mammograms. Talk with your health care provider about your test results, treatment options, and if necessary, the need for more tests. Vaccines  Your health care provider may recommend certain vaccines, such as: Influenza vaccine. This is recommended every year. Tetanus, diphtheria, and acellular pertussis (Tdap, Td) vaccine. You may need a Td booster every 10 years. Zoster vaccine. You may need this after age 66. Pneumococcal 13-valent conjugate (PCV13) vaccine. One dose is recommended after age 73. Pneumococcal polysaccharide (PPSV23) vaccine. One dose is recommended after age 4. Talk to your health care provider about which screenings and vaccines you need and how often you need them. This information is not intended to replace advice given to you by your health care provider. Make sure you discuss any questions you have with your health care provider. Document Released: 03/10/2015 Document Revised: 11/01/2015 Document Reviewed: 12/13/2014 Elsevier Interactive Patient Education  2017 [place] Prevention in the Home Falls can cause injuries. They can happen to people of all ages. There are many things you can do to make your home safe and to help prevent falls. What can I do on the outside of my home? Regularly fix the edges of walkways and driveways and fix any cracks. Remove anything that might make you trip as you walk through a door, such as a raised step or threshold. Trim any  bushes or trees on the path to your home. Use bright outdoor lighting. Clear any walking paths of anything that might make someone trip, such as rocks or tools. Regularly check to see if handrails are loose or broken. Make sure that both sides of any steps have handrails. Any raised decks and porches should have guardrails on the edges. Have any leaves, snow, or ice cleared regularly. Use sand or salt on walking paths during winter. Clean up any spills in your garage right away. This includes oil or grease spills. What can I do in the bathroom? Use night lights. Install grab bars by the toilet and in the tub and shower. Do not use towel bars as grab bars. Use non-skid mats or decals in the tub or shower. If you need to sit down in the shower, use a plastic, non-slip  stool. Keep the floor dry. Clean up any water that spills on the floor as soon as it happens. Remove soap buildup in the tub or shower regularly. Attach bath mats securely with double-sided non-slip rug tape. Do not have throw rugs and other things on the floor that can make you trip. What can I do in the bedroom? Use night lights. Make sure that you have a light by your bed that is easy to reach. Do not use any sheets or blankets that are too big for your bed. They should not hang down onto the floor. Have a firm chair that has side arms. You can use this for support while you get dressed. Do not have throw rugs and other things on the floor that can make you trip. What can I do in the kitchen? Clean up any spills right away. Avoid walking on wet floors. Keep items that you use a lot in easy-to-reach places. If you need to reach something above you, use a strong step stool that has a grab bar. Keep electrical cords out of the way. Do not use floor polish or wax that makes floors slippery. If you must use wax, use non-skid floor wax. Do not have throw rugs and other things on the floor that can make you trip. What can I do  with my stairs? Do not leave any items on the stairs. Make sure that there are handrails on both sides of the stairs and use them. Fix handrails that are broken or loose. Make sure that handrails are as long as the stairways. Check any carpeting to make sure that it is firmly attached to the stairs. Fix any carpet that is loose or worn. Avoid having throw rugs at the top or bottom of the stairs. If you do have throw rugs, attach them to the floor with carpet tape. Make sure that you have a light switch at the top of the stairs and the bottom of the stairs. If you do not have them, ask someone to add them for you. What else can I do to help prevent falls? Wear shoes that: Do not have high heels. Have rubber bottoms. Are comfortable and fit you well. Are closed at the toe. Do not wear sandals. If you use a stepladder: Make sure that it is fully opened. Do not climb a closed stepladder. Make sure that both sides of the stepladder are locked into place. Ask someone to hold it for you, if possible. Clearly mark and make sure that you can see: Any grab bars or handrails. First and last steps. Where the edge of each step is. Use tools that help you move around (mobility aids) if they are needed. These include: Canes. Walkers. Scooters. Crutches. Turn on the lights when you go into a dark area. Replace any light bulbs as soon as they burn out. Set up your furniture so you have a clear path. Avoid moving your furniture around. If any of your floors are uneven, fix them. If there are any pets around you, be aware of where they are. Review your medicines with your doctor. Some medicines can make you feel dizzy. This can increase your chance of falling. Ask your doctor what other things that you can do to help prevent falls. This information is not intended to replace advice given to you by your health care provider. Make sure you discuss any questions you have with your health care  provider. Document Released: 12/08/2008 Document Revised: 07/20/2015 Document Reviewed:  03/18/2014 Elsevier Interactive Patient Education  2017 [employer].

## 2022-01-28 ENCOUNTER — Ambulatory Visit (INDEPENDENT_AMBULATORY_CARE_PROVIDER_SITE_OTHER): Payer: 59 | Admitting: Nurse Practitioner

## 2022-01-28 ENCOUNTER — Encounter: Payer: Self-pay | Admitting: Nurse Practitioner

## 2022-01-28 VITALS — BP 145/83 | HR 85 | Temp 98.5°F | Ht 70.0 in | Wt 328.0 lb

## 2022-01-28 DIAGNOSIS — M170 Bilateral primary osteoarthritis of knee: Secondary | ICD-10-CM

## 2022-01-28 DIAGNOSIS — Z1211 Encounter for screening for malignant neoplasm of colon: Secondary | ICD-10-CM

## 2022-01-28 DIAGNOSIS — G43709 Chronic migraine without aura, not intractable, without status migrainosus: Secondary | ICD-10-CM

## 2022-01-28 NOTE — Assessment & Plan Note (Signed)
Patient is managed by neurology.

## 2022-01-28 NOTE — Assessment & Plan Note (Signed)
Referral completed to pain management clinic.  Continue current medication.  Follow-up with unresolved symptoms.

## 2022-01-28 NOTE — Progress Notes (Signed)
Established Patient Office Visit  Subjective   Patient ID: Vanessa [name], female    DOB: Mar 12, 1975  Age: 46 y.o. MRN: [id number]  Chief Complaint  Patient presents with   Post-op Follow-up    HPI Patient presents to clinic today following up after surgery 12/26/2021 from [facility].  Patient had a right lumboperitoneal shunt insertion.  patient developed a blood clot in her right arm after shunt placement and is currently on Xarelto.  Patient continues to experience migraine, and unresolved pain.  Patient has follow-up appointment scheduled with neurology for reassessment, and shunt pressure adjustments as needed.  For ongoing pain patient is requesting referral to pain management clinic.  Patient Active Problem List   Diagnosis Date Noted   Well adult exam 11/28/2021   Allergy to alpha-gal 05/21/2021   Seasonal allergies 01/04/2021   Cervical pain 01/04/2021   Type 2 diabetes mellitus with hyperglycemia, without long-term current use of insulin ([place]) 01/04/2021   Controlled substance agreement signed 04/06/2020   Chronic migraine without aura without status migrainosus, not intractable 10/05/2019   Bilateral primary osteoarthritis of knee 08/11/2019   Bilateral pes planus 08/11/2019   Hyperlipidemia associated with type 2 diabetes mellitus ([place]) 81/02/7508   Metabolic syndrome 25/85/2778   Essential hypertension 03/04/2016   PCOS (polycystic ovarian syndrome) 03/04/2016   Morbid obesity with BMI of 45.0-49.9, adult ([place]) 11/21/2015   S/P ventriculoperitoneal shunt 10/25/2015   IIH (idiopathic intracranial hypertension) 09/21/2015   Blindness of right eye 08/14/2015   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   Hypertension    Migraines    Pseudotumor (inflammatory) of orbit    Sepsis ([place]) 02/2019   Sleep apnea    Vaginal Pap smear, abnormal    Vision loss    Past  Surgical History:  Procedure Laterality Date   BRAIN SURGERY     CSF SHUNT     Social History   Tobacco Use   Smoking status: Never   Smokeless tobacco: Never  Vaping Use   Vaping Use: Never used  Substance Use Topics   Alcohol use: No   Drug use: No   Social History   Socioeconomic History   Marital status: Single    Spouse name: Not on file   Number of children: 0   Years of education: Not on file   Highest education level: Not on file  Occupational History   Occupation: disability  Tobacco Use   Smoking status: Never   Smokeless tobacco: Never  Vaping Use   Vaping Use: Never used  Substance and Sexual Activity   Alcohol use: No   Drug use: No   Sexual activity: Not Currently    Birth control/protection: Condom  Other Topics Concern   Not on file  Social History Narrative   Lives with parents   Right handed   Caffeine: maybe 2 cups/day   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   Social Determinants of Health   Financial Resource Strain: Low Risk  (01/25/2022)   Overall Financial Resource Strain (CARDIA)    Difficulty of Paying Living Expenses: Not hard at all  Food Insecurity: No Food Insecurity (01/25/2022)   Hunger Vital Sign    Worried About Running Out of Food in the Last Year: Never true    Ran Out of Food in the Last Year: Never true  Transportation Needs: No Transportation Needs (01/25/2022)   PRAPARE -  Hydrologist (Medical): No    Lack of Transportation (Non-Medical): No  Physical Activity: Inactive (01/25/2022)   Exercise Vital Sign    Days of Exercise per Week: 0 days    Minutes of Exercise per Session: 0 min  Stress: No Stress Concern Present (01/25/2022)   [place]    Feeling of Stress : Not at all  Social Connections: Moderately Isolated (01/25/2022)   Social Connection and Isolation Panel [NHANES]    Frequency of Communication with Friends and Family: More  than three times a week    Frequency of Social Gatherings with Friends and Family: More than three times a week    Attends Religious Services: More than 4 times per year    Active Member of [employer] or Organizations: No    Attends Archivist Meetings: Never    Marital Status: Never married  Intimate Partner Violence: Not At Risk (01/25/2022)   Humiliation, Afraid, Rape, and Kick questionnaire    Fear of Current or Ex-Partner: No    Emotionally Abused: No    Physically Abused: No    Sexually Abused: No   Family Status  Relation Name Status   Mother  Alive   Father  Alive   Sister [place]letta [place] Alive   Neg Hx  (Not Specified)   Family History  Problem Relation Age of Onset   Pulmonary Hypertension Mother    Congestive Heart Failure Mother    Hypertension Sister    Obesity Sister    Migraines Neg Hx    Allergies  Allergen Reactions   Metrizamide Other (See Comments), Itching, Photosensitivity, Shortness Of Breath and Swelling    update Migraine instantly   Mushroom Extract Complex Itching    Throat itching with cough   Peanut Oil Anaphylaxis and Other (See Comments)    update   Peanut-Containing Drug Products Itching    Itching throat with cough Itching throat with cough   Diflucan In Dextrose [Fluconazole In Dextrose] Hives   Iodinated Contrast Media    Iodine-131 Other (See Comments)    update      Review of Systems  Constitutional: Negative.  Negative for chills and fever.  HENT: Negative.    Respiratory: Negative.    Cardiovascular: Negative.   Gastrointestinal: Negative.   Genitourinary: Negative.   Skin: Negative.  Negative for itching and rash.  Neurological:  Positive for headaches.  All other systems reviewed and are negative.     Objective:     BP (!) 145/83   Pulse 85   Temp 98.5 F (36.9 C)   Ht _0  (1.778 m)   Wt (!) 328 lb (148.8 kg)   LMP 12/29/2021 (Within Days)   SpO2 96%   BMI 47.06 kg/m  BP Readings from Last 3 Encounters:   01/28/22 (!) 145/83  01/21/22 (!) 144/84  11/28/21 (!) 148/83   Wt Readings from Last 3 Encounters:  01/28/22 (!) 328 lb (148.8 kg)  01/25/22 (!) 322 lb (146.1 kg)  01/21/22 (!) 323 lb (146.5 kg)      Physical Exam Vitals and nursing note reviewed.  Constitutional:      Appearance: Normal appearance.  HENT:     Head: Normocephalic.     Right Ear: External ear normal.     Left Ear: External ear normal.     Nose: Nose normal.     Mouth/Throat:     Mouth: Mucous membranes are moist.  Pharynx: Oropharynx is clear.  Eyes:     Conjunctiva/sclera: Conjunctivae normal.     Pupils: Pupils are equal, round, and reactive to light.  Cardiovascular:     Rate and Rhythm: Normal rate and regular rhythm.     Pulses: Normal pulses.     Heart sounds: Normal heart sounds.  Pulmonary:     Effort: Pulmonary effort is normal.     Breath sounds: Normal breath sounds.  Abdominal:     General: Bowel sounds are normal.  Skin:    General: Skin is warm.  Neurological:     General: No focal deficit present.     Mental Status: She is alert and oriented to person, place, and time.      No results found for any visits on 01/28/22.  Last CBC Lab Results  Component Value Date   WBC 7.9 11/28/2021   HGB 9.6 (L) 11/28/2021   HCT 31.4 (L) 11/28/2021   MCV 80 11/28/2021   MCH 24.3 (L) 11/28/2021   RDW 14.6 11/28/2021   PLT 287 82/88/3374   Last metabolic panel Lab Results  Component Value Date   GLUCOSE 95 11/28/2021   NA 137 11/28/2021   K 3.9 11/28/2021   CL 103 11/28/2021   CO2 21 11/28/2021   BUN 9 11/28/2021   CREATININE 0.85 11/28/2021   EGFR 86 11/28/2021   CALCIUM 9.1 11/28/2021   PROT 7.2 11/28/2021   ALBUMIN 4.2 11/28/2021   LABGLOB 3.0 11/28/2021   AGRATIO 1.4 11/28/2021   BILITOT 0.4 11/28/2021   ALKPHOS 64 11/28/2021   AST 19 11/28/2021   ALT 15 11/28/2021      The 10-year ASCVD risk score (Arnett DK, et al., 2019) is: 13%    Assessment & Plan:    Problem List Items Addressed This Visit       Cardiovascular and Mediastinum   Chronic migraine without aura without status migrainosus, not intractable - Primary    Patient is managed by neurology.        Relevant Orders   Ambulatory referral to Pain Clinic     Musculoskeletal and Integument   Bilateral primary osteoarthritis of knee (Chronic)    Referral completed to pain management clinic.  Continue current medication.  Follow-up with unresolved symptoms.      Relevant Orders   Ambulatory referral to Pain Clinic    Return if symptoms worsen or fail to improve.    Ivy Lynn, NP

## 2022-01-28 NOTE — Patient Instructions (Signed)
Migraine Headache  A migraine headache is a very strong throbbing pain on one side or both sides of your head. This type of headache can also cause other symptoms. It can last from 4 hours to 3 days. Talk with your doctor about what things may bring on (trigger) this condition.  What are the causes?  The exact cause of this condition is not known. This condition may be triggered or caused by:  Drinking alcohol.  Smoking.  Taking medicines, such as:  Medicine used to treat chest pain (nitroglycerin).  Birth control pills.  Estrogen.  Some blood pressure medicines.  Eating or drinking certain products.  Doing physical activity.  Other things that may trigger a migraine headache include:  Having a menstrual period.  Pregnancy.  Hunger.  Stress.  Not getting enough sleep or getting too much sleep.  Weather changes.  Tiredness (fatigue).  What increases the risk?  Being 25-55 years old.  Being female.  Having a family history of migraine headaches.  Being Caucasian.  Having depression or anxiety.  Being very overweight.  What are the signs or symptoms?  A throbbing pain. This pain may:  Happen in any area of the head, such as on one side or both sides.  Make it hard to do daily activities.  Get worse with physical activity.  Get worse around bright lights or loud noises.  Other symptoms may include:  Feeling sick to your stomach (nauseous).  Vomiting.  Dizziness.  Being sensitive to bright lights, loud noises, or smells.  Before you get a migraine headache, you may get warning signs (an aura). An aura may include:  Seeing flashing lights or having blind spots.  Seeing bright spots, halos, or zigzag lines.  Having tunnel vision or blurred vision.  Having numbness or a tingling feeling.  Having trouble talking.  Having weak muscles.  Some people have symptoms after a migraine headache (postdromal phase), such as:  Tiredness.  Trouble thinking (concentrating).  How is this treated?  Taking medicines that:  Relieve  pain.  Relieve the feeling of being sick to your stomach.  Prevent migraine headaches.  Treatment may also include:  Having acupuncture.  Avoiding foods that bring on migraine headaches.  Learning ways to control your body functions (biofeedback).  Therapy to help you know and deal with negative thoughts (cognitive behavioral therapy).  Follow these instructions at home:  Medicines  Take over-the-counter and prescription medicines only as told by your doctor.  Ask your doctor if the medicine prescribed to you:  Requires you to avoid driving or using heavy machinery.  Can cause trouble pooping (constipation). You may need to take these steps to prevent or treat trouble pooping:  Drink enough fluid to keep your pee (urine) pale yellow.  Take over-the-counter or prescription medicines.  Eat foods that are high in fiber. These include beans, whole grains, and fresh fruits and vegetables.  Limit foods that are high in fat and sugar. These include fried or sweet foods.  Lifestyle  Do not drink alcohol.  Do not use any products that contain nicotine or tobacco, such as cigarettes, e-cigarettes, and chewing tobacco. If you need help quitting, ask your doctor.  Get at least 8 hours of sleep every night.  Limit and deal with stress.  General instructions  Keep a journal to find out what may bring on your migraine headaches. For example, write down:  What you eat and drink.  How much sleep you get.  Any change in   what you eat or drink.  Any change in your medicines.  If you have a migraine headache:  Avoid things that make your symptoms worse, such as bright lights.  It may help to lie down in a dark, quiet room.  Do not drive or use heavy machinery.  Ask your doctor what activities are safe for you.  Keep all follow-up visits as told by your doctor. This is important.  Contact a doctor if:  You get a migraine headache that is different or worse than others you have had.  You have more than 15 headache days in one month.  Get  help right away if:  Your migraine headache gets very bad.  Your migraine headache lasts longer than 72 hours.  You have a fever.  You have a stiff neck.  You have trouble seeing.  Your muscles feel weak or like you cannot control them.  You start to lose your balance a lot.  You start to have trouble walking.  You pass out (faint).  You have a seizure.  Summary  A migraine headache is a very strong throbbing pain on one side or both sides of your head. These headaches can also cause other symptoms.  This condition may be treated with medicines and changes to your lifestyle.  Keep a journal to find out what may bring on your migraine headaches.  Contact a doctor if you get a migraine headache that is different or worse than others you have had.  Contact your doctor if you have more than 15 headache days in a month.  This information is not intended to replace advice given to you by your health care provider. Make sure you discuss any questions you have with your health care provider.  Document Revised: 07/26/2021 Document Reviewed: 03/26/2018  Elsevier Patient Education  2023 Elsevier Inc[employer]

## 2022-01-28 NOTE — Addendum Note (Signed)
Addended by: Ivy Lynn on: 01/28/2022 11:42 AM   Modules accepted: Orders

## 2022-01-29 ENCOUNTER — Other Ambulatory Visit: Payer: Self-pay | Admitting: Family Medicine

## 2022-01-29 DIAGNOSIS — E1165 Type 2 diabetes mellitus with hyperglycemia: Secondary | ICD-10-CM

## 2022-01-30 ENCOUNTER — Encounter: Payer: 59 | Admitting: Nutrition

## 2022-02-11 ENCOUNTER — Other Ambulatory Visit: Payer: Self-pay | Admitting: Family Medicine

## 2022-02-11 DIAGNOSIS — F5101 Primary insomnia: Secondary | ICD-10-CM

## 2022-02-11 DIAGNOSIS — I10 Essential (primary) hypertension: Secondary | ICD-10-CM

## 2022-02-12 ENCOUNTER — Ambulatory Visit
Admission: RE | Admit: 2022-02-12 | Discharge: 2022-02-12 | Disposition: A | Discharge status: Home / Self Care | Payer: 59 | Source: Ambulatory Visit | Attending: Family Medicine | Admitting: Family Medicine

## 2022-02-12 DIAGNOSIS — Z1231 Encounter for screening mammogram for malignant neoplasm of breast: Secondary | ICD-10-CM

## 2022-02-23 ENCOUNTER — Other Ambulatory Visit: Payer: Self-pay | Admitting: Nurse Practitioner

## 2022-02-23 DIAGNOSIS — G43709 Chronic migraine without aura, not intractable, without status migrainosus: Secondary | ICD-10-CM

## 2022-03-01 ENCOUNTER — Encounter: Payer: Self-pay | Admitting: Nurse Practitioner

## 2022-03-01 ENCOUNTER — Ambulatory Visit (INDEPENDENT_AMBULATORY_CARE_PROVIDER_SITE_OTHER): Payer: 59 | Admitting: Nurse Practitioner

## 2022-03-01 VITALS — BP 129/76 | HR 81 | Temp 98.7°F | Ht 70.0 in | Wt 315.0 lb

## 2022-03-01 DIAGNOSIS — I10 Essential (primary) hypertension: Secondary | ICD-10-CM

## 2022-03-01 DIAGNOSIS — Z79899 Other long term (current) drug therapy: Secondary | ICD-10-CM | POA: Diagnosis not present

## 2022-03-01 DIAGNOSIS — M542 Cervicalgia: Secondary | ICD-10-CM | POA: Diagnosis not present

## 2022-03-01 DIAGNOSIS — E1165 Type 2 diabetes mellitus with hyperglycemia: Secondary | ICD-10-CM

## 2022-03-01 LAB — BAYER DCA HB A1C WAIVED: HB A1C (BAYER DCA - WAIVED): 7.4 % — ABNORMAL HIGH (ref 4.8–5.6)

## 2022-03-01 NOTE — Assessment & Plan Note (Signed)
Completed labs: CBC, CMP, Lipid panel. Results pending. A1c is 7.4, goal is <7.  Patient is on a GLP-1 and has had good blood glucose control, advised patient to reduce high calorie diet (diabetic diet recommended), exercise and weight loss. Patient verbalize understanding.

## 2022-03-01 NOTE — Progress Notes (Signed)
Established Patient Office Visit  Subjective   Patient ID: Vanessa [name], female    DOB: [birth year]  Age: 47 y.o. MRN: [id number]  Chief Complaint  Patient presents with   chronic disease managemnt   Hypertension   Diabetes     HPI  Pt presents for follow up of hypertension. Patient was diagnosed in  The patient is tolerating the medication well without side effects. Compliance with treatment has been good; including taking medication as directed , maintains a healthy diet and regular exercise regimen , and following up as directed.    The patient presents with history of type 2 diabetes mellitus without complications. Patient was diagnosed in 01/04/2021. Compliance with treatment has been good; the patient takes medication as directed , maintains a diabetic diet and an exercise regimen , follows up as directed , and is keeping a glucose diary. Patient specifically denies associated symptoms, including blurred vision, fatigue, polydipsia, polyphagia and polyuria . Patient denies hypoglycemia. In regard to preventative care, the patient performs foot self-exams daily and last ophthalmology exam was in : she will schedule an appointment.    Patient Active Problem List   Diagnosis Date Noted   Well adult exam 11/28/2021   Allergy to alpha-gal 05/21/2021   Seasonal allergies 01/04/2021   Cervical pain 01/04/2021   Type 2 diabetes mellitus with hyperglycemia, without long-term current use of insulin ([place]) 01/04/2021   Controlled substance agreement signed 04/06/2020   Chronic migraine without aura without status migrainosus, not intractable 10/05/2019   Bilateral primary osteoarthritis of knee 08/11/2019   Bilateral pes planus 08/11/2019   Hyperlipidemia associated with type 2 diabetes mellitus ([place]) 54/00/8676   Metabolic syndrome 19/50/9326   Essential hypertension 03/04/2016   PCOS (polycystic ovarian syndrome) 03/04/2016   Morbid obesity with BMI of 45.0-49.9, adult ([place])  11/21/2015   S/P ventriculoperitoneal shunt 10/25/2015   IIH (idiopathic intracranial hypertension) 09/21/2015   Blindness of right eye 08/14/2015   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   Hypertension    Migraines    Pseudotumor (inflammatory) of orbit    Sepsis ([place]) 02/2019   Sleep apnea    Vaginal Pap smear, abnormal    Vision loss    Past Surgical History:  Procedure Laterality Date   BRAIN SURGERY     CSF SHUNT     Social History   Tobacco Use   Smoking status: Never   Smokeless tobacco: Never  Vaping Use   Vaping Use: Never used  Substance Use Topics   Alcohol use: No   Drug use: No   Social History   Socioeconomic History   Marital status: Single    Spouse name: Not on file   Number of children: 0   Years of education: Not on file   Highest education level: Not on file  Occupational History   Occupation: disability  Tobacco Use   Smoking status: Never   Smokeless tobacco: Never  Vaping Use   Vaping Use: Never used  Substance and Sexual Activity   Alcohol use: No   Drug use: No   Sexual activity: Not Currently    Birth control/protection: Condom  Other Topics Concern   Not on file  Social History Narrative   Lives with parents   Right handed   Caffeine: maybe 2 cups/day   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   Social Determinants of Health   Financial Resource Strain: Low  Risk  (01/25/2022)   Overall Financial Resource Strain (CARDIA)    Difficulty of Paying Living Expenses: Not hard at all  Food Insecurity: No Food Insecurity (01/25/2022)   Hunger Vital Sign    Worried About Running Out of Food in the Last Year: Never true    Ran Out of Food in the Last Year: Never true  Transportation Needs: No Transportation Needs (01/25/2022)   PRAPARE - Hydrologist (Medical): No    Lack of Transportation (Non-Medical): No  Physical Activity: Inactive  (01/25/2022)   Exercise Vital Sign    Days of Exercise per Week: 0 days    Minutes of Exercise per Session: 0 min  Stress: No Stress Concern Present (01/25/2022)   [place]    Feeling of Stress : Not at all  Social Connections: Moderately Isolated (01/25/2022)   Social Connection and Isolation Panel [NHANES]    Frequency of Communication with Friends and Family: More than three times a week    Frequency of Social Gatherings with Friends and Family: More than three times a week    Attends Religious Services: More than 4 times per year    Active Member of [employer] or Organizations: No    Attends Archivist Meetings: Never    Marital Status: Never married  Intimate Partner Violence: Not At Risk (01/25/2022)   Humiliation, Afraid, Rape, and Kick questionnaire    Fear of Current or Ex-Partner: No    Emotionally Abused: No    Physically Abused: No    Sexually Abused: No   Family Status  Relation Name Status   Mother  Alive   Father  Alive   Sister [place]letta [place] Alive   Neg Hx  (Not Specified)   Family History  Problem Relation Age of Onset   Pulmonary Hypertension Mother    Congestive Heart Failure Mother    Hypertension Sister    Obesity Sister    Migraines Neg Hx    Allergies  Allergen Reactions   Metrizamide Other (See Comments), Itching, Photosensitivity, Shortness Of Breath and Swelling    update Migraine instantly   Mushroom Extract Complex Itching    Throat itching with cough   Peanut Oil Anaphylaxis and Other (See Comments)    update   Peanut-Containing Drug Products Itching    Itching throat with cough Itching throat with cough   Diflucan In Dextrose [Fluconazole In Dextrose] Hives   Iodinated Contrast Media    Iodine-131 Other (See Comments)    update      Review of Systems  Constitutional: Negative.  Negative for chills and fever.  HENT: Negative.    Eyes: Negative.   Respiratory:  Negative.    Cardiovascular: Negative.   Gastrointestinal: Negative.   Genitourinary: Negative.   Musculoskeletal: Negative.   Skin: Negative.  Negative for itching and rash.  Neurological: Negative.   All other systems reviewed and are negative.     Objective:     BP 129/76   Pulse 81   Temp 98.7 F (37.1 C)   Ht '5\' 10"'$  (1.778 m)   Wt (!) 315 lb (142.9 kg)   SpO2 100%   BMI 45.20 kg/m  BP Readings from Last 3 Encounters:  03/01/22 129/76  01/28/22 (!) 145/83  01/21/22 (!) 144/84   Wt Readings from Last 3 Encounters:  03/01/22 (!) 315 lb (142.9 kg)  01/28/22 (!) 328 lb (148.8 kg)  01/25/22 (!) 322 lb (146.1  kg)      Physical Exam Vitals and nursing note reviewed.  HENT:     Right Ear: External ear normal.     Left Ear: External ear normal.     Nose: Nose normal.     Mouth/Throat:     Mouth: Mucous membranes are moist.     Pharynx: Oropharynx is clear.  Eyes:     Conjunctiva/sclera: Conjunctivae normal.  Cardiovascular:     Rate and Rhythm: Normal rate and regular rhythm.     Pulses: Normal pulses.     Heart sounds: Normal heart sounds.  Pulmonary:     Effort: Pulmonary effort is normal.     Breath sounds: Normal breath sounds.  Abdominal:     General: Bowel sounds are normal.  Skin:    General: Skin is warm.     Findings: No erythema or rash.  Neurological:     General: No focal deficit present.     Mental Status: She is alert and oriented to person, place, and time.  Psychiatric:        Behavior: Behavior normal.      Results for orders placed or performed in visit on 03/01/22  Bayer DCA Hb A1c Waived  Result Value Ref Range   HB A1C (BAYER DCA - WAIVED) 7.4 (H) 4.8 - 5.6 %    Last CBC Lab Results  Component Value Date   WBC 7.9 11/28/2021   HGB 9.6 (L) 11/28/2021   HCT 31.4 (L) 11/28/2021   MCV 80 11/28/2021   MCH 24.3 (L) 11/28/2021   RDW 14.6 11/28/2021   PLT 287 62/69/4854   Last metabolic panel Lab Results  Component Value  Date   GLUCOSE 95 11/28/2021   NA 137 11/28/2021   K 3.9 11/28/2021   CL 103 11/28/2021   CO2 21 11/28/2021   BUN 9 11/28/2021   CREATININE 0.85 11/28/2021   EGFR 86 11/28/2021   CALCIUM 9.1 11/28/2021   PROT 7.2 11/28/2021   ALBUMIN 4.2 11/28/2021   LABGLOB 3.0 11/28/2021   AGRATIO 1.4 11/28/2021   BILITOT 0.4 11/28/2021   ALKPHOS 64 11/28/2021   AST 19 11/28/2021   ALT 15 11/28/2021   Last lipids Lab Results  Component Value Date   CHOL 162 11/28/2021   HDL 42 11/28/2021   LDLCALC 98 11/28/2021   TRIG 124 11/28/2021   CHOLHDL 3.9 11/28/2021   Last hemoglobin A1c Lab Results  Component Value Date   HGBA1C 7.4 (H) 03/01/2022      The 10-year ASCVD risk score (Arnett DK, et al., 2019) is: 7.6%    Assessment & Plan:   Problem List Items Addressed This Visit       Cardiovascular and Mediastinum   Essential hypertension - Primary   Relevant Orders   Bayer DCA Hb A1c Waived (Completed)   CBC with Differential/Platelet   CMP14+EGFR   Lipid panel     Endocrine   Type 2 diabetes mellitus with hyperglycemia, without long-term current use of insulin (HCC)    Completed labs: CBC, CMP, Lipid panel. Results pending. A1c is 7.4, goal is <7.  Patient is on a GLP-1 and has had good blood glucose control, advised patient to reduce high calorie diet (diabetic diet recommended), exercise and weight loss. Patient verbalize understanding.         Other   Controlled substance agreement signed    Controlled substance arrangement has expired, patient will need a new  contract.  Cervical pain    Patient is currently on hydrocodone for back pain. We discussed that chronic pain is best managed at the pain clinic. Referral to pain management completed.       Relevant Orders   Ambulatory referral to Pain Clinic    No follow-ups on file.    Ivy Lynn, NP

## 2022-03-01 NOTE — Patient Instructions (Signed)
Diabetes Insipidus Diabetes insipidus (DI) is a rare condition that causes the body to produce more urine than normal. This leads to thirst and low fluid in the body (dehydration). In this condition, the urine is made mostly of water, or dilute urine. DI affects mostly adults, but it can happen at any age. There are four types of DI: Central DI. This is the most common type. Dipsogenic DI. Nephrogenic DI. Gestational DI. The most common forms of this condition are caused by a decrease in the production of the hormone that regulates urine output (antidiuretic hormone), or the body's resistance to this hormone. This condition is not related to type 1 or type 2 diabetes mellitus. What are the causes? Central DI is caused by damage to the pituitary gland or hypothalamus in the brain. Dipsogenic DI is caused by a defect in the thirst mechanism in the brain. This defect causes you to drink too much fluid. These may result from: Brain surgery. Infection. Inflammation. Brain tumor. Head injury. Nephrogenic DI is caused by the kidneys not responding to the antidiuretic hormone in the body. This may result from: Chronic kidney disease (CKD). Certain medicines, such as lithium. Low potassium levels. High calcium levels. Gestational DI is rare and is caused by the antidiuretic hormone that has stopped working properly. What are the signs or symptoms? Symptoms of this condition include: Excessive urination. This means urinating more than 10 cups (2.4 L) during a period of 24 hours. Excessive thirst. Too much nighttime urination (nocturia). Nausea. Diarrhea. How is this diagnosed? This condition may be diagnosed based on: Your medical history. A physical exam. Blood tests. Urine tests. A water deprivation test. During this test, you will stop drinking fluids for a period of time and your blood and urine will be checked regularly. An MRI. How is this treated? Once your specific type of  diabetes insipidus is diagnosed, treatment may include one or more of the following: Increasing or limiting your fluid intake. Taking medicines that contain artificial (synthetic) versions of the antidiuretic hormone. Stopping certain medicines that you take. Correcting the balance of minerals (electrolytes) in your body. Changing your diet. You may be put on a low-protein and low-sodium diet. You may need to visit your health care provider regularly to make sure your condition is being treated properly. You may also need to work with providers who specialize in: Kidney problems (nephrologist). Hormone disorders (endocrinologist). Follow these instructions at home:  Eating and drinking Follow instructions from your health care provider about how much fluid and water to drink. You may be directed to drink more fluids and water, or to limit how much fluid and water you drink. Follow instructions from your health care provider about eating or drinking restrictions. General instructions Take over-the-counter and prescription medicines only as told by your health care provider. If directed, monitor your risk of dehydration in extreme heat. Carry a medical alert card or wear medical alert jewelry. Keep all follow-up visits as told by your health care provider. This is important. You may need to visit your health care provider regularly to make sure your condition is being treated properly. Contact a health care provider if: You continue to have symptoms after treatment. Get help right away if: You have extreme thirst. You have symptoms of severe dehydration, such as rapid heart rate, muscle cramps, or confusion. Summary Diabetes insipidus (DI) is a rare condition that causes the body to produce more urine than normal, which leads to thirst and dehydration. Follow instructions   from your health care provider about eating or drinking restrictions. Treatment may include increasing or limiting your  fluid intake and correcting the balance of minerals (electrolytes) in your body. Get help right away if you have symptoms of severe dehydration, such as rapid heart rate, muscle cramps, or confusion. This information is not intended to replace advice given to you by your health care provider. Make sure you discuss any questions you have with your health care provider. Document Revised: 07/17/2021 Document Reviewed: 03/17/2019 Elsevier Patient Education  2023 Elsevier Inc.  

## 2022-03-01 NOTE — Assessment & Plan Note (Signed)
Controlled substance arrangement has expired, patient will need a new  contract.

## 2022-03-01 NOTE — Assessment & Plan Note (Signed)
Patient is currently on hydrocodone for back pain. We discussed that chronic pain is best managed at the pain clinic. Referral to pain management completed.

## 2022-03-02 LAB — CMP14+EGFR
ALT: 15 IU/L (ref 0–32)
AST: 17 IU/L (ref 0–40)
Albumin/Globulin Ratio: 1.2 (ref 1.2–2.2)
Albumin: 3.9 g/dL (ref 3.9–4.9)
Alkaline Phosphatase: 66 IU/L (ref 44–121)
BUN/Creatinine Ratio: 10 (ref 9–23)
BUN: 9 mg/dL (ref 6–24)
Bilirubin Total: 0.5 mg/dL (ref 0.0–1.2)
CO2: 22 mmol/L (ref 20–29)
Calcium: 8.8 mg/dL (ref 8.7–10.2)
Chloride: 104 mmol/L (ref 96–106)
Creatinine, Ser: 0.9 mg/dL (ref 0.57–1.00)
Globulin, Total: 3.3 g/dL (ref 1.5–4.5)
Glucose: 74 mg/dL (ref 70–99)
Potassium: 4 mmol/L (ref 3.5–5.2)
Sodium: 139 mmol/L (ref 134–144)
Total Protein: 7.2 g/dL (ref 6.0–8.5)
eGFR: 80 mL/min/{1.73_m2} (ref >59)

## 2022-03-02 LAB — CBC WITH DIFFERENTIAL/PLATELET
Basophils Absolute: 0 10*3/uL (ref 0.0–0.2)
Basos: 1 %
EOS (ABSOLUTE): 0.2 10*3/uL (ref 0.0–0.4)
Eos: 2 %
Hematocrit: 32.2 % — ABNORMAL LOW (ref 34.0–46.6)
Hemoglobin: 9.7 g/dL — ABNORMAL LOW (ref 11.1–15.9)
Immature Grans (Abs): 0 10*3/uL (ref 0.0–0.1)
Immature Granulocytes: 0 %
Lymphocytes Absolute: 2.3 10*3/uL (ref 0.7–3.1)
Lymphs: 33 %
MCH: 21.3 pg — ABNORMAL LOW (ref 26.6–33.0)
MCHC: 30.1 g/dL — ABNORMAL LOW (ref 31.5–35.7)
MCV: 71 fL — ABNORMAL LOW (ref 79–97)
Monocytes Absolute: 0.5 10*3/uL (ref 0.1–0.9)
Monocytes: 7 %
Neutrophils Absolute: 4 10*3/uL (ref 1.4–7.0)
Neutrophils: 57 %
Platelets: 255 10*3/uL (ref 150–450)
RBC: 4.56 x10E6/uL (ref 3.77–5.28)
RDW: 18.6 % — ABNORMAL HIGH (ref 11.7–15.4)
WBC: 7.1 10*3/uL (ref 3.4–10.8)

## 2022-03-02 LAB — LIPID PANEL
Chol/HDL Ratio: 4.2 ratio (ref 0.0–4.4)
Cholesterol, Total: 156 mg/dL (ref 100–199)
HDL: 37 mg/dL — ABNORMAL LOW (ref >39)
LDL Chol Calc (NIH): 103 mg/dL — ABNORMAL HIGH (ref 0–99)
Triglycerides: 86 mg/dL (ref 0–149)
VLDL Cholesterol Cal: 16 mg/dL (ref 5–40)

## 2022-03-04 LAB — COLOGUARD: COLOGUARD: NEGATIVE

## 2022-03-05 ENCOUNTER — Ambulatory Visit: Payer: 59 | Admitting: Pulmonary Disease

## 2022-03-13 ENCOUNTER — Ambulatory Visit: Payer: 59 | Admitting: Family Medicine

## 2022-03-18 ENCOUNTER — Telehealth: Payer: Self-pay | Admitting: Family Medicine

## 2022-03-18 DIAGNOSIS — G43709 Chronic migraine without aura, not intractable, without status migrainosus: Secondary | ICD-10-CM

## 2022-03-18 NOTE — Telephone Encounter (Signed)
Pt has an upcoming botox appointment scheduled for 04/10/22 and will need a new PA before appointment. Prior PA expired 03/16/22.

## 2022-03-18 NOTE — Telephone Encounter (Signed)
Needs Botox re-auth.  Chronic Migraine CPT 64615    Botox J0585 Units:200   G43.709 Chronic migraine without aura without status migrainosus, not intractable

## 2022-03-20 ENCOUNTER — Encounter: Payer: 59 | Attending: Family Medicine | Admitting: Nutrition

## 2022-03-29 ENCOUNTER — Other Ambulatory Visit (HOSPITAL_COMMUNITY): Payer: Self-pay

## 2022-03-29 NOTE — Telephone Encounter (Signed)
Pharmacy Patient Advocate Encounter   Received notification from [facility] that prior authorization for Botox 200UNIT solution is required/requested.    PA submitted on 03/29/2022 to (ins) [employer] via CoverMyMeds [facility]  Status is pending  Will also verify per Medical.

## 2022-03-29 NOTE — Telephone Encounter (Signed)
Benefit Verification [id number] Submitted! BotoxONe

## 2022-04-01 ENCOUNTER — Other Ambulatory Visit: Payer: Self-pay

## 2022-04-01 ENCOUNTER — Other Ambulatory Visit (HOSPITAL_COMMUNITY): Payer: Self-pay

## 2022-04-01 MED ORDER — ONABOTULINUMTOXINA 200 UNITS IJ SOLR
INTRAMUSCULAR | 2 refills | Status: DC
  Filled 2022-04-01: qty 1, 84d supply, fill #0

## 2022-04-01 NOTE — Telephone Encounter (Signed)
E-scribed Rx Botox to [facility].

## 2022-04-01 NOTE — Addendum Note (Signed)
Addended by: Wyvonnia Lora on: 04/01/2022 09:16 AM   Modules accepted: Orders

## 2022-04-01 NOTE — Telephone Encounter (Signed)
Pharmacy Patient Advocate Encounter  Prior Authorization for Botox 200UNIT solution has been approved.    PA# PA Case ID: [id number] Effective dates: 03/29/2022 through 06/27/2022 Per test billing copay is zero This can be filled with [facility]

## 2022-04-03 ENCOUNTER — Ambulatory Visit: Payer: 59 | Admitting: Nutrition

## 2022-04-08 ENCOUNTER — Other Ambulatory Visit: Payer: Self-pay | Admitting: *Deleted

## 2022-04-08 DIAGNOSIS — M65332 Trigger finger, left middle finger: Secondary | ICD-10-CM

## 2022-04-09 MED ORDER — NAPROXEN 500 MG PO TABS: 500.0000 mg | ORAL_TABLET | Freq: Two times a day (BID) | ORAL | 0 refills | Status: DC

## 2022-04-09 NOTE — Progress Notes (Unsigned)
04/10/2022 ALL: Vanessa [name] returns for Botox. Migraines have been worse over the past two weeks. She was unable to get her Botox on time. Nurtec does help.   12/19/2021 ALL: Vanessa [name] returns for Botox. Migraines are well managed on Ajovy and Nurtec.Has more headaches at end of cycle. She continues Diamox 254m BID. She is planning to have right lumboperitoneal shunt insertion with Dr JHarvel Ricks10/31/2023.   09/19/2021 ALL: Vanessa [name] for Botox. Migraines remain well managed on Ajovy and Nurtec. 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 She was seen by Dr AhJaynee Eagles/2023 and dose reduced to 12574mID with plans to increase to 250m37mD. Referral to [facility] for shunt eval. CT/shunt series show shunt intact, valve pressure at 140. 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 She is schedule to return for venous angiogram with manometry next month. She is tolerating Diamox 125mg23m. She has follow up with AhernJaynee Eagles month.   06/25/2021 ALL: Vanessa [name] for 3rd procedure. [place] and [place] is doing well. 4-5 migraines on average. Nurtec works well.   03/26/2021 ALL: Vanessa [name] for 2nd Botox procedure. She continues Ajovy and Nurtec.  She reports over 50% reduction in migraine frequency and intensity. Now having 3-4 migrainous headaches per month. Migraines are easily aborted with Nurtec.   01/01/2021 ALL: Vanessa [name] for her first Botox procedure. Baseline daily headaches with 8-12 migrainous headaches per month. She continues Ajovy for migraine prevention. Amitriptyline prescribed by PCP. Nurtec helps with abortive therapy.    Consent Form Botulism Toxin Injection For Chronic Migraine    Reviewed orally with patient, additionally signature is on file:  Botulism toxin has been approved by the Federal drug administration for treatment of chronic migraine. Botulism toxin does not cure chronic migraine and it may not be  effective in some patients.  The administration of botulism toxin is accomplished by injecting a small amount of toxin into the muscles of the neck and head. Dosage must be titrated for each individual. Any benefits resulting from botulism toxin tend to wear off after 3 months with a repeat injection required if benefit is to be maintained. Injections are usually done every 3-4 months with maximum effect peak achieved by about 2 or 3 weeks. Botulism toxin is expensive and you should be sure of what costs you will incur resulting from the injection.  The side effects of botulism toxin use for chronic migraine may include:   -Transient, and usually mild, facial weakness with facial injections  -Transient, and usually mild, head or neck weakness with head/neck injections  -Reduction or loss of forehead facial animation due to forehead muscle weakness  -Eyelid drooping  -Dry eye  -Pain at the site of injection or bruising at the site of injection  -Double vision  -Potential unknown long term risks   Contraindications: You should not have Botox if you are pregnant, nursing, allergic to albumin, have an infection, skin condition, or muscle weakness at the site of the injection, or have myasthenia gravis, Lambert-Eaton syndrome, or ALS.  It is also possible that as with any injection, there may be an allergic reaction or no effect from the medication. Reduced effectiveness after repeated injections is sometimes seen and rarely infection at the injection site may occur. All care will be taken to prevent these side effects. If therapy is given over a long time, atrophy and wasting in the muscle injected may occur. Occasionally the  patient's become refractory to treatment because they develop antibodies to the toxin. In this event, therapy needs to be modified.  I have read the above information and consent to the administration of botulism toxin.    BOTOX PROCEDURE NOTE FOR MIGRAINE  HEADACHE  Contraindications and precautions discussed with patient(above). Aseptic procedure was observed and patient tolerated procedure. Procedure performed by [name], FNP-C.   The condition has existed for more than 6 months, and pt does not have a diagnosis of ALS, Myasthenia Gravis or Lambert-Eaton Syndrome.  Risks and benefits of injections discussed and pt agrees to proceed with the procedure.  Written consent obtained  These injections are medically necessary. Pt  receives good benefits from these injections. These injections do not cause sedations or hallucinations which the oral therapies may cause.   Description of procedure:  The patient was placed in a sitting position. The standard protocol was used for Botox as follows, with 5 units of Botox injected at each site:  -Procerus muscle, midline injection  -Corrugator muscle, bilateral injection  -Frontalis muscle, bilateral injection, with 2 sites each side, medial injection was performed in the upper one third of the frontalis muscle, in the region vertical from the medial inferior edge of the superior orbital rim. The lateral injection was again in the upper one third of the forehead vertically above the lateral limbus of the cornea, 1.5 cm lateral to the medial injection site.  -Temporalis muscle injection, 4 sites, bilaterally. The first injection was 3 cm above the tragus of the ear, second injection site was 1.5 cm to 3 cm up from the first injection site in line with the tragus of the ear. The third injection site was 1.5-3 cm forward between the first 2 injection sites. The fourth injection site was 1.5 cm posterior to the second injection site. 5th site laterally in the temporalis  muscleat the level of the outer canthus.  -Occipitalis muscle injection, 3 sites, bilaterally. The first injection was done one half way between the occipital protuberance and the tip of the mastoid process behind the ear. The second injection  site was done lateral and superior to the first, 1 fingerbreadth from the first injection. The third injection site was 1 fingerbreadth superiorly and medially from the first injection site.  -Cervical paraspinal muscle injection, 2 sites, bilaterally. The first injection site was 1 cm from the midline of the cervical spine, 3 cm inferior to the lower border of the occipital protuberance. The second injection site was 1.5 cm superiorly and laterally to the first injection site.  -Trapezius muscle injection was performed at 3 sites, bilaterally. The first injection site was in the upper trapezius muscle halfway between the inflection point of the neck, and the acromion. The second injection site was one half way between the acromion and the first injection site. The third injection was done between the first injection site and the inflection point of the neck.   Will return for repeat injection in 3 months.   A total of 200 units of Botox was prepared, 155 units of Botox was injected as documented above, 45 units of Botox was wasted. The patient tolerated the procedure well, there were no complications of the above procedure.

## 2022-04-10 ENCOUNTER — Ambulatory Visit (INDEPENDENT_AMBULATORY_CARE_PROVIDER_SITE_OTHER): Payer: 59 | Admitting: Family Medicine

## 2022-04-10 DIAGNOSIS — G43709 Chronic migraine without aura, not intractable, without status migrainosus: Secondary | ICD-10-CM | POA: Diagnosis not present

## 2022-04-10 MED ORDER — ONABOTULINUMTOXINA 200 UNITS IJ SOLR
155.0000 [IU] | Freq: Once | INTRAMUSCULAR | Status: AC
  Administered 2022-04-10: 155 [IU] via INTRAMUSCULAR

## 2022-04-10 NOTE — Progress Notes (Signed)
Botox- 200 units x 1 vial Lot: [id number] Expiration:07/2024 NDC: 0023-3921-02  Bacteriostatic 0.9% Sodium Chloride- 79m total Lot: [id number]: 11/25 NDC: 619758-832-54 Dx: GD82.641S/P

## 2022-04-17 ENCOUNTER — Other Ambulatory Visit: Payer: Self-pay | Admitting: Family Medicine

## 2022-04-17 DIAGNOSIS — E1165 Type 2 diabetes mellitus with hyperglycemia: Secondary | ICD-10-CM

## 2022-04-24 ENCOUNTER — Other Ambulatory Visit: Payer: Self-pay | Admitting: Family Medicine

## 2022-04-24 DIAGNOSIS — J302 Other seasonal allergic rhinitis: Secondary | ICD-10-CM

## 2022-04-26 ENCOUNTER — Other Ambulatory Visit: Payer: Self-pay | Admitting: *Deleted

## 2022-04-26 DIAGNOSIS — E1165 Type 2 diabetes mellitus with hyperglycemia: Secondary | ICD-10-CM

## 2022-04-26 MED ORDER — EMPAGLIFLOZIN 25 MG PO TABS: 25.0000 mg | ORAL_TABLET | Freq: Every day | ORAL | 0 refills | Status: DC

## 2022-05-01 ENCOUNTER — Ambulatory Visit: Payer: 59 | Admitting: Nutrition

## 2022-05-02 LAB — HM DIABETES EYE EXAM

## 2022-05-05 ENCOUNTER — Other Ambulatory Visit: Payer: Self-pay | Admitting: Family Medicine

## 2022-05-05 DIAGNOSIS — M65332 Trigger finger, left middle finger: Secondary | ICD-10-CM

## 2022-05-08 ENCOUNTER — Other Ambulatory Visit: Payer: Self-pay | Admitting: Family Medicine

## 2022-05-08 DIAGNOSIS — F5101 Primary insomnia: Secondary | ICD-10-CM

## 2022-05-13 ENCOUNTER — Ambulatory Visit: Payer: 59 | Admitting: Pulmonary Disease

## 2022-05-14 ENCOUNTER — Telehealth: Payer: Self-pay

## 2022-05-14 NOTE — Transitions of Care (Post Inpatient/ED Visit) (Signed)
   05/14/2022  Name: Vanessa [name] MRN: [id number] DOB: 06/24/1975  Today's TOC FU Call Status: Today's TOC FU Call Status:: Successful TOC FU Call Competed TOC FU Call Complete Date: 05/14/22  Transition Care Management Follow-up Telephone Call Date of Discharge: 05/13/22 Discharge Facility: Other ([facility]) Name of Other ([facility]) Discharge Facility: [facility] Type of Discharge: Emergency Department Reason for ED Visit: Other: (gastroenteritis) How have you been since you were released from the hospital?: Better Any questions or concerns?: No  Items Reviewed: Did you receive and understand the discharge instructions provided?: Yes Medications obtained and verified?: Yes (Medications Reviewed) Any new allergies since your discharge?: No Dietary orders reviewed?: Yes Do you have support at home?: Yes People in Home: parent(s)  Home Care and Equipment/Supplies: [place] Ordered?: NA Any new equipment or medical supplies ordered?: NA  Functional Questionnaire: Do you need assistance with bathing/showering or dressing?: No Do you need assistance with meal preparation?: No Do you need assistance with eating?: No Do you have difficulty maintaining continence: No Do you need assistance with getting out of bed/getting out of a chair/moving?: No Do you have difficulty managing or taking your medications?: No  Follow up appointments reviewed: PCP Follow-up appointment confirmed?: [facility] Follow-up appointment confirmed?: NA Do you need transportation to your follow-up appointment?: No Do you understand care options if your condition(s) worsen?: Yes-patient verbalized understanding    [place], [place] Direct Dial [phone]

## 2022-05-20 ENCOUNTER — Other Ambulatory Visit: Payer: Self-pay | Admitting: Family Medicine

## 2022-05-20 DIAGNOSIS — F5101 Primary insomnia: Secondary | ICD-10-CM

## 2022-05-31 ENCOUNTER — Encounter: Payer: Self-pay | Admitting: Family Medicine

## 2022-05-31 ENCOUNTER — Ambulatory Visit (INDEPENDENT_AMBULATORY_CARE_PROVIDER_SITE_OTHER): Payer: 59 | Admitting: Family Medicine

## 2022-05-31 VITALS — BP 136/79 | HR 80 | Temp 98.0°F | Ht 70.0 in | Wt 314.6 lb

## 2022-05-31 DIAGNOSIS — Z7985 Long-term (current) use of injectable non-insulin antidiabetic drugs: Secondary | ICD-10-CM

## 2022-05-31 DIAGNOSIS — M65332 Trigger finger, left middle finger: Secondary | ICD-10-CM

## 2022-05-31 DIAGNOSIS — E785 Hyperlipidemia, unspecified: Secondary | ICD-10-CM | POA: Diagnosis not present

## 2022-05-31 DIAGNOSIS — I152 Hypertension secondary to endocrine disorders: Secondary | ICD-10-CM

## 2022-05-31 DIAGNOSIS — E1165 Type 2 diabetes mellitus with hyperglycemia: Secondary | ICD-10-CM

## 2022-05-31 DIAGNOSIS — Z6841 Body Mass Index (BMI) 40.0 and over, adult: Secondary | ICD-10-CM

## 2022-05-31 DIAGNOSIS — E1169 Type 2 diabetes mellitus with other specified complication: Secondary | ICD-10-CM

## 2022-05-31 DIAGNOSIS — E1159 Type 2 diabetes mellitus with other circulatory complications: Secondary | ICD-10-CM

## 2022-05-31 LAB — BAYER DCA HB A1C WAIVED: HB A1C (BAYER DCA - WAIVED): 6 % — ABNORMAL HIGH (ref 4.8–5.6)

## 2022-05-31 MED ORDER — LANCETS MISC. MISC
1.0000 | Freq: Three times a day (TID) | 0 refills | Status: AC
Start: 2022-05-31 — End: 2022-06-30

## 2022-05-31 MED ORDER — FUROSEMIDE 40 MG PO TABS: 40.0000 mg | ORAL_TABLET | Freq: Every day | ORAL | 1 refills | Status: DC

## 2022-05-31 MED ORDER — LISINOPRIL 40 MG PO TABS: 40.0000 mg | ORAL_TABLET | Freq: Every day | ORAL | 0 refills | Status: DC

## 2022-05-31 MED ORDER — NAPROXEN 500 MG PO TABS: 500.0000 mg | ORAL_TABLET | Freq: Two times a day (BID) | ORAL | 2 refills | Status: DC | PRN

## 2022-05-31 MED ORDER — BLOOD GLUCOSE MONITORING SUPPL DEVI
1.0000 | Freq: Three times a day (TID) | 0 refills | Status: DC
Start: 2022-05-31 — End: 2023-04-30

## 2022-05-31 MED ORDER — LANCET DEVICE MISC
1.0000 | Freq: Three times a day (TID) | 0 refills | Status: AC
Start: 2022-05-31 — End: 2022-06-30

## 2022-05-31 MED ORDER — SPIRONOLACTONE 100 MG PO TABS
100.0000 mg | ORAL_TABLET | Freq: Two times a day (BID) | ORAL | 1 refills | Status: DC
Start: 2022-05-31 — End: 2022-06-03

## 2022-05-31 MED ORDER — BLOOD GLUCOSE TEST VI STRP
1.0000 | ORAL_STRIP | Freq: Three times a day (TID) | 0 refills | Status: DC
Start: 2022-05-31 — End: 2022-07-01

## 2022-05-31 NOTE — Patient Instructions (Addendum)

## 2022-05-31 NOTE — Progress Notes (Signed)
Subjective:  Patient ID: Vanessa [name], female    DOB: 20-Feb-1976, 47 y.o.   MRN: [id number]  Patient Care Team: Sonny Masters, FNP as PCP - General (Family Medicine)   Chief Complaint:  Establish Care (Previous JE patient ) and Diabetes (3 month follow up )   HPI: Vanessa [name] is a 47 y.o. female presenting on 05/31/2022 for Establish Care (Previous [facility] patient ) and Diabetes (3 month follow up )  Pt presents today to establish care with new PCP and for management of chronic medical conditions. She is followed by several specialists on a regular basis and those records have been reviewed.   1. Type 2 diabetes mellitus with hyperglycemia, without long-term current use of insulin Compliant with Ozempic and Jardiance. Denies polyuria, polyphagia, or polydipsia. No adverse side effects from medications. No UTI or vaginitis symptoms. On ACEi and statin.   2. Hyperlipidemia associated with type 2 diabetes mellitus Compliant with medications - Yes Current medications - atorvastatin Side effects from medications - No Diet - does not follow a strict diet Exercise - none   3. Hypertension associated with type 2 diabetes mellitus Complaint with meds - Yes Current Medications - Diamox, Lasix, Aldactone, Toprol, Lisinopril Checking BP at home - No Exercising Regularly - No Watching Salt intake - No Pertinent ROS:  Headache - Yes Fatigue - No Visual Disturbances - chronic changes Chest pain - No Dyspnea - No Palpitations - No LE edema - Yes They report good compliance with medications and can restate their regimen by memory. No medication side effects.  BP Readings from Last 3 Encounters:  05/31/22 136/79  03/01/22 129/76  01/28/22 (!) 145/83     4. Morbid obesity with BMI of 45.0-49.9, adult Does not follow a strict diet or exercise routine. Has been evaluated by nutrition. Encouraged to follow up    Relevant past medical, surgical, family, and social history reviewed  and updated as indicated.  Allergies and medications reviewed and updated. Data reviewed: Chart in Epic.   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   Hypertension    Migraines    Pseudotumor (inflammatory) of orbit    Sepsis 02/2019   Sleep apnea    Vaginal Pap smear, abnormal    Vision loss     Past Surgical History:  Procedure Laterality Date   BRAIN SURGERY     CSF SHUNT      Social History   Socioeconomic History   Marital status: Single    Spouse name: Not on file   Number of children: 0   Years of education: Not on file   Highest education level: Some college, no degree  Occupational History   Occupation: disability  Tobacco Use   Smoking status: Never   Smokeless tobacco: Never  Vaping Use   Vaping Use: Never used  Substance and Sexual Activity   Alcohol use: No   Drug use: No   Sexual activity: Not Currently    Birth control/protection: Condom  Other Topics Concern   Not on file  Social History Narrative   Lives with parents   Right handed   Caffeine: maybe 2 cups/day   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   Social Determinants of Health   Financial Resource Strain: Low Risk  (05/27/2022)   Overall Financial Resource Strain (CARDIA)    Difficulty of Paying Living Expenses: Not very hard  Food Insecurity: No Food Insecurity (05/27/2022)  Hunger Vital Sign    Worried About Running Out of Food in the Last Year: Never true    Ran Out of Food in the Last Year: Never true  Transportation Needs: Unmet Transportation Needs (05/27/2022)   PRAPARE - Transportation    Lack of Transportation (Medical): Yes    Lack of Transportation (Non-Medical): Yes  Physical Activity: Insufficiently Active (05/27/2022)   Exercise Vital Sign    Days of Exercise per Week: 2 days    Minutes of Exercise per Session: 10 min  Stress: No Stress Concern Present (05/27/2022)   [employer] of Occupational Health - Occupational Stress Questionnaire     Feeling of Stress : Only a little  Social Connections: Moderately Isolated (05/27/2022)   Social Connection and Isolation Panel [NHANES]    Frequency of Communication with Friends and Family: Twice a week    Frequency of Social Gatherings with Friends and Family: More than three times a week    Attends Religious Services: 1 to 4 times per year    Active Member of [employer] or Organizations: No    Attends Banker Meetings: Never    Marital Status: Never married  Intimate Partner Violence: Not At Risk (01/25/2022)   Humiliation, Afraid, Rape, and Kick questionnaire    Fear of Current or Ex-Partner: No    Emotionally Abused: No    Physically Abused: No    Sexually Abused: No    Outpatient Encounter Medications as of 05/31/2022  Medication Sig   Blood Glucose Monitoring Suppl DEVI 1 each by Does not apply route in the morning, at noon, and at bedtime. May substitute to any manufacturer covered by patient's insurance.   Glucose Blood (BLOOD GLUCOSE TEST STRIPS) STRP 1 each by In Vitro route in the morning, at noon, and at bedtime. May substitute to any manufacturer covered by patient's insurance.   Lancet Device MISC 1 each by Does not apply route in the morning, at noon, and at bedtime. May substitute to any manufacturer covered by patient's insurance.   Lancets Misc. MISC 1 each by Does not apply route in the morning, at noon, and at bedtime. May substitute to any manufacturer covered by patient's insurance.   acetaZOLAMIDE (DIAMOX) 125 MG tablet Take 2 tablets (250 mg total) by mouth 2 (two) times daily.   amitriptyline (ELAVIL) 50 MG tablet Take 1 tablet by mouth daily.   atorvastatin (LIPITOR) 80 MG tablet TAKE 1 TABLET BY MOUTH EVERY DAY   botulinum toxin Type A (BOTOX) 200 units injection Inject 200 Units into the muscle every 3 (three) months. To be administer by provider and dispose any remainder.   cyclobenzaprine (FLEXERIL) 10 MG tablet TAKE 1 TABLET BY MOUTH THREE TIMES A  DAY AS NEEDED FOR MUSCLE SPASMS   dicyclomine (BENTYL) 20 MG tablet Take 20 mg by mouth 2 (two) times daily.   empagliflozin (JARDIANCE) 25 MG TABS tablet Take 1 tablet (25 mg total) by mouth daily before breakfast.   EPINEPHrine (EPIPEN 2-PAK) 0.3 mg/0.3 mL IJ SOAJ injection Inject 0.3 mg into the muscle as needed for anaphylaxis.   ferrous sulfate (FE TABS) 325 (65 FE) MG EC tablet Take 1 tablet (325 mg total) by mouth daily with breakfast.   flunisolide (NASALIDE) 25 MCG/ACT (0.025%) SOLN Instill 2 sprays into each nostril twice daily.   Fremanezumab-vfrm (AJOVY) 225 MG/1.5ML SOSY Inject 1.5 mLs into the skin every 30 (thirty) days.   furosemide (LASIX) 40 MG tablet Take 1 tablet (40 mg total) by  mouth daily.   lisinopril (ZESTRIL) 40 MG tablet Take 1 tablet (40 mg total) by mouth daily.   loratadine (CLARITIN) 10 MG tablet Take 1 tablet by mouth daily.   metoprolol succinate (TOPROL-XL) 25 MG 24 hr tablet TAKE 1 TABLET (25 MG TOTAL) BY MOUTH DAILY.   montelukast (SINGULAIR) 10 MG tablet Take 1 tablet by mouth daily at bedtime.   Multiple Vitamin (MULTIVITAMIN) tablet Take 1 tablet by mouth daily.   OneTouch Delica Lancets 30G MISC 1 each by Does not apply route daily. Dx E11.9   Oxycodone HCl 10 MG TABS Take 1 tablet (10 mg total) by mouth 2 (two) times daily as needed.   Oxycodone HCl 10 MG TABS Take 1 tablet (10 mg total) by mouth 2 (two) times daily as needed.   Oxycodone HCl 10 MG TABS Take 1 tablet (10 mg total) by mouth 2 (two) times daily as needed.   Rimegepant Sulfate (NURTEC) 75 MG TBDP Take 75 mg by mouth daily as needed. Onset migraine (max one daily)   Semaglutide, 2 MG/DOSE, (OZEMPIC, 2 MG/DOSE,) 8 MG/3ML SOPN INJECT 2 MGS SUBCUTANEOUSLY AS DIRECTED ONCE A WEEK   spironolactone (ALDACTONE) 100 MG tablet Take 1 tablet (100 mg total) by mouth 2 (two) times daily.   traZODone (DESYREL) 50 MG tablet TAKE 1 TO 2 TABLETS BY MOUTH AT BEDTIME AS NEEDED SLEEP   [DISCONTINUED] furosemide  (LASIX) 40 MG tablet TAKE 1 TABLET BY MOUTH EVERY DAY   [DISCONTINUED] glucose blood test strip Use to test sugar daily. Dx E11.65   [DISCONTINUED] lisinopril (ZESTRIL) 40 MG tablet TAKE 1 TABLET BY MOUTH EVERY DAY   [DISCONTINUED] megestrol (MEGACE) 40 MG tablet TAKE 3 TABLETS EVERY DAY FOR 5 DAYS TAKE 2 TABLETS EVERY DAY FOR 5 DAYS TAKE 1 TABLET EVERY DAY   [DISCONTINUED] naproxen (NAPROSYN) 500 MG tablet TAKE 1 TABLET BY MOUTH TWICE A DAY   [DISCONTINUED] naproxen (NAPROSYN) 500 MG tablet Take 1 tablet (500 mg total) by mouth 2 (two) times daily as needed.   [DISCONTINUED] promethazine-dextromethorphan (PROMETHAZINE-DM) 6.25-15 MG/5ML syrup Take 5 mLs by mouth 4 (four) times daily as needed for cough.   [DISCONTINUED] Rivaroxaban (XARELTO) 15 MG TABS tablet Take 15 mg by mouth 2 (two) times daily with a meal.   [DISCONTINUED] spironolactone (ALDACTONE) 100 MG tablet TAKE 1 TABLET BY MOUTH TWICE A DAY   No facility-administered encounter medications on file as of 05/31/2022.    Allergies  Allergen Reactions   Iodinated Contrast Media Swelling, Cough, Other (See Comments), Itching, Photosensitivity, Shortness Of Breath and Anaphylaxis    Migraine instantly  update, Migraine instantly  update    update update update  update Migraine instantly    update Migraine instantly update Migraine instantly   Metrizamide Other (See Comments), Itching, Photosensitivity, Shortness Of Breath and Swelling    update Migraine instantly   Mushroom Extract Complex Itching    Throat itching with cough   Peanut Oil Anaphylaxis and Other (See Comments)    update   Peanut-Containing Drug Products Itching, Anaphylaxis and Other (See Comments)    Itching throat with cough  update   Diflucan In Dextrose [Fluconazole In Dextrose] Hives   Iodine-131 Other (See Comments)    update    Review of Systems  Constitutional:  Negative for activity change, appetite change, chills, diaphoresis, fatigue, fever  and unexpected weight change.  HENT: Negative.    Eyes:  Positive for visual disturbance (chronic).  Respiratory:  Negative for cough, chest tightness  and shortness of breath.   Cardiovascular:  Positive for leg swelling. Negative for chest pain and palpitations.  Gastrointestinal:  Negative for blood in stool, constipation, diarrhea, nausea and vomiting.  Endocrine: Negative.   Genitourinary:  Negative for dysuria, frequency and urgency.  Musculoskeletal:  Negative for arthralgias and myalgias.  Skin:        Skin dryness  Allergic/Immunologic: Negative.   Neurological:  Negative for dizziness and headaches.  Hematological: Negative.   Psychiatric/Behavioral:  Negative for confusion, hallucinations, sleep disturbance and suicidal ideas. The patient is not nervous/anxious.   All other systems reviewed and are negative.       Objective:  BP 136/79   Pulse 80   Temp 98 F (36.7 C) (Temporal)   Ht 5\' 10"  (1.778 m)   Wt (!) 314 lb 9.6 oz (142.7 kg)   SpO2 100%   BMI 45.14 kg/m    Wt Readings from Last 3 Encounters:  05/31/22 (!) 314 lb 9.6 oz (142.7 kg)  03/01/22 (!) 315 lb (142.9 kg)  01/28/22 (!) 328 lb (148.8 kg)    Physical Exam Vitals and nursing note reviewed.  Constitutional:      General: She is not in acute distress.    Appearance: Normal appearance. She is well-developed and well-groomed. She is morbidly obese. She is not ill-appearing, toxic-appearing or diaphoretic.  HENT:     Head: Normocephalic and atraumatic.     Jaw: There is normal jaw occlusion.     Right Ear: Hearing normal.     Left Ear: Hearing normal.     Nose: Nose normal.     Mouth/Throat:     Lips: Pink.     Mouth: Mucous membranes are moist.     Pharynx: Oropharynx is clear. Uvula midline.  Eyes:     General: Lids are normal.     Conjunctiva/sclera: Conjunctivae normal.     Pupils: Pupils are equal, round, and reactive to light.     Comments: Exotropia of right eye, blind in right eye   Neck:     Thyroid: No thyroid mass, thyromegaly or thyroid tenderness.     Vascular: No carotid bruit or JVD.     Trachea: Trachea and phonation normal.  Cardiovascular:     Rate and Rhythm: Normal rate and regular rhythm.     Chest Wall: PMI is not displaced.     Pulses: Normal pulses.          Dorsalis pedis pulses are 2+ on the right side and 2+ on the left side.       Posterior tibial pulses are 2+ on the right side and 2+ on the left side.     Heart sounds: Normal heart sounds. No murmur heard.    No friction rub. No gallop.     Comments: Chronic venous statis changes to bilateral lower extremities, right greater than left Pulmonary:     Effort: Pulmonary effort is normal. No respiratory distress.     Breath sounds: Normal breath sounds. No wheezing.  Abdominal:     General: Bowel sounds are normal. There is no abdominal bruit.     Palpations: Abdomen is soft. There is no hepatomegaly or splenomegaly.  Musculoskeletal:        General: Normal range of motion.     Cervical back: Normal range of motion and neck supple.     Right lower leg: 2+ Edema present.     Left lower leg: 2+ Edema present.  Feet:  Right foot:     Skin integrity: Callus and dry skin present.     Left foot:     Skin integrity: Callus and dry skin present.     Comments: Left great toenail missing Lymphadenopathy:     Cervical: No cervical adenopathy.  Skin:    General: Skin is warm and dry.     Capillary Refill: Capillary refill takes less than 2 seconds.     Coloration: Skin is not cyanotic, jaundiced or pale.     Findings: No rash.     Comments: Venous stasis changes to bilateral lower extremities  Neurological:     General: No focal deficit present.     Mental Status: She is alert and oriented to person, place, and time.     Sensory: Sensation is intact.     Motor: Motor function is intact.     Coordination: Coordination is intact.     Gait: Gait is intact.     Deep Tendon Reflexes: Reflexes  are normal and symmetric.  Psychiatric:        Attention and Perception: Attention and perception normal.        Mood and Affect: Mood and affect normal.        Speech: Speech normal.        Behavior: Behavior normal. Behavior is cooperative.        Thought Content: Thought content normal.        Cognition and Memory: Cognition and memory normal.        Judgment: Judgment normal.     Results for orders placed or performed in visit on 05/31/22  Bayer DCA Hb A1c Waived  Result Value Ref Range   HB A1C (BAYER DCA - WAIVED) 6.0 (H) 4.8 - 5.6 %       Pertinent labs & imaging results that were available during my care of the patient were reviewed by me and considered in my medical decision making.  Assessment & Plan:  Vanessa [name] was seen today for establish care and diabetes.  Diagnoses and all orders for this visit:  Type 2 diabetes mellitus with hyperglycemia, without long-term current use of insulin A1C 6.0 in office today. Continue current regimen. Diet and exercise encouraged. Pt needs new glucometer, DME script sent to pharmacy.  -     Bayer DCA Hb A1c Waived -     Microalbumin / creatinine urine ratio -     Blood Glucose Monitoring Suppl DEVI; 1 each by Does not apply route in the morning, at noon, and at bedtime. May substitute to any manufacturer covered by patient's insurance. -     Glucose Blood (BLOOD GLUCOSE TEST STRIPS) STRP; 1 each by In Vitro route in the morning, at noon, and at bedtime. May substitute to any manufacturer covered by patient's insurance. -     Lancet Device MISC; 1 each by Does not apply route in the morning, at noon, and at bedtime. May substitute to any manufacturer covered by patient's insurance. -     Lancets Misc. MISC; 1 each by Does not apply route in the morning, at noon, and at bedtime. May substitute to any manufacturer covered by patient's insurance.  Hyperlipidemia associated with type 2 diabetes mellitus Diet and exercise encouraged. Continue  statin therapy.   Hypertension associated with type 2 diabetes mellitus BP well controlled. Changes were not made in regimen today. Goal BP is 130/80. Pt aware to report any persistent high or low readings. DASH diet and exercise encouraged. Exercise  at least 150 minutes per week and increase as tolerated. Goal BMI > 25. Stress management encouraged. Avoid nicotine and tobacco product use. Avoid excessive alcohol and NSAID's. Avoid more than 2000 mg of sodium daily. Medications as prescribed. Follow up as scheduled.  -     furosemide (LASIX) 40 MG tablet; Take 1 tablet (40 mg total) by mouth daily. -     lisinopril (ZESTRIL) 40 MG tablet; Take 1 tablet (40 mg total) by mouth daily. -     spironolactone (ALDACTONE) 100 MG tablet; Take 1 tablet (100 mg total) by mouth 2 (two) times daily.  Morbid obesity with BMI of 45.0-49.9, adult Diet and exercise encouraged.      Continue all other maintenance medications.  Follow up plan: Return in about 3 months (around 08/30/2022), or if symptoms worsen or fail to improve, for DM.   Continue healthy lifestyle choices, including diet (rich in fruits, vegetables, and lean proteins, and low in salt and simple carbohydrates) and exercise (at least 30 minutes of moderate physical activity daily).  Educational handout given for DM  The above assessment and management plan was discussed with the patient. The patient verbalized understanding of and has agreed to the management plan. Patient is aware to call the clinic if they develop any new symptoms or if symptoms persist or worsen. Patient is aware when to return to the clinic for a follow-up visit. Patient educated on when it is appropriate to go to the emergency department.   Kari BaarsMichelle Ysidra Sopher, FNP-C [facility] [phone]

## 2022-06-02 LAB — MICROALBUMIN / CREATININE URINE RATIO
Creatinine, Urine: 206.2 mg/dL
Microalb/Creat Ratio: 2 mg/g creat (ref 0–29)
Microalbumin, Urine: 5.1 ug/mL

## 2022-06-03 ENCOUNTER — Other Ambulatory Visit: Payer: Self-pay | Admitting: *Deleted

## 2022-06-03 DIAGNOSIS — E1159 Type 2 diabetes mellitus with other circulatory complications: Secondary | ICD-10-CM

## 2022-06-03 MED ORDER — SPIRONOLACTONE 100 MG PO TABS
100.0000 mg | ORAL_TABLET | Freq: Two times a day (BID) | ORAL | 0 refills | Status: DC
Start: 2022-06-03 — End: 2022-12-05

## 2022-06-03 MED ORDER — LISINOPRIL 40 MG PO TABS
40.0000 mg | ORAL_TABLET | Freq: Every day | ORAL | 0 refills | Status: DC
Start: 2022-06-03 — End: 2022-08-26

## 2022-06-18 ENCOUNTER — Ambulatory Visit (INDEPENDENT_AMBULATORY_CARE_PROVIDER_SITE_OTHER): Payer: 59 | Admitting: Podiatry

## 2022-06-18 DIAGNOSIS — E119 Type 2 diabetes mellitus without complications: Secondary | ICD-10-CM | POA: Diagnosis not present

## 2022-06-18 DIAGNOSIS — M2041 Other hammer toe(s) (acquired), right foot: Secondary | ICD-10-CM | POA: Diagnosis not present

## 2022-06-18 DIAGNOSIS — L853 Xerosis cutis: Secondary | ICD-10-CM | POA: Diagnosis not present

## 2022-06-18 DIAGNOSIS — E1165 Type 2 diabetes mellitus with hyperglycemia: Secondary | ICD-10-CM

## 2022-06-18 DIAGNOSIS — M2042 Other hammer toe(s) (acquired), left foot: Secondary | ICD-10-CM

## 2022-06-18 NOTE — Progress Notes (Signed)
Subjective: Vanessa [name] presents today referred by Sonny Masters, FNP for diabetic foot evaluation.  Patient relates many year history of diabetes.  Patient denies any history of foot wounds.  Patient denies any history of numbness, tingling, burning, pins/needles sensations.  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   Hypertension    Migraines    Pseudotumor (inflammatory) of orbit    Sepsis 02/2019   Sleep apnea    Vaginal Pap smear, abnormal    Vision loss     Patient Active Problem List   Diagnosis Date Noted   Allergy to alpha-gal 05/21/2021   Seasonal allergies 01/04/2021   Type 2 diabetes mellitus with hyperglycemia, without long-term current use of insulin 01/04/2021   Controlled substance agreement signed 04/06/2020   Chronic migraine without aura without status migrainosus, not intractable 10/05/2019   Bilateral primary osteoarthritis of knee 08/11/2019   Bilateral pes planus 08/11/2019   Hyperlipidemia associated with type 2 diabetes mellitus 07/05/2019   PCOS (polycystic ovarian syndrome) 03/04/2016   Morbid obesity with BMI of 45.0-49.9, adult 11/21/2015   S/P ventriculoperitoneal shunt 10/25/2015   IIH (idiopathic intracranial hypertension) 09/21/2015   Blindness of right eye 08/14/2015   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    Past Surgical History:  Procedure Laterality Date   BRAIN SURGERY     CSF SHUNT      Current Outpatient Medications on File Prior to Visit  Medication Sig Dispense Refill   acetaZOLAMIDE (DIAMOX) 125 MG tablet Take 2 tablets (250 mg total) by mouth 2 (two) times daily. 60 tablet 11   amitriptyline (ELAVIL) 50 MG tablet Take 1 tablet by mouth daily. 90 tablet 0   atorvastatin (LIPITOR) 80 MG tablet TAKE 1 TABLET BY MOUTH EVERY DAY 90 tablet 0   Blood Glucose Monitoring Suppl DEVI 1 each by Does not apply route in the morning, at noon, and at bedtime. May substitute to any manufacturer  covered by patient's insurance. 1 each 0   botulinum toxin Type A (BOTOX) 200 units injection Inject 200 Units into the muscle every 3 (three) months. To be administer by provider and dispose any remainder. 1 each 2   cyclobenzaprine (FLEXERIL) 10 MG tablet TAKE 1 TABLET BY MOUTH THREE TIMES A DAY AS NEEDED FOR MUSCLE SPASMS 60 tablet 2   dicyclomine (BENTYL) 20 MG tablet Take 20 mg by mouth 2 (two) times daily.     empagliflozin (JARDIANCE) 25 MG TABS tablet Take 1 tablet (25 mg total) by mouth daily before breakfast. 90 tablet 0   EPINEPHrine (EPIPEN 2-PAK) 0.3 mg/0.3 mL IJ SOAJ injection Inject 0.3 mg into the muscle as needed for anaphylaxis. 2 each 1   ferrous sulfate (FE TABS) 325 (65 FE) MG EC tablet Take 1 tablet (325 mg total) by mouth daily with breakfast. 60 tablet 1   flunisolide (NASALIDE) 25 MCG/ACT (0.025%) SOLN Instill 2 sprays into each nostril twice daily. 25 mL 0   Fremanezumab-vfrm (AJOVY) 225 MG/1.5ML SOSY Inject 1.5 mLs into the skin every 30 (thirty) days. 4.5 mL 3   furosemide (LASIX) 40 MG tablet Take 1 tablet (40 mg total) by mouth daily. 90 tablet 1   Glucose Blood (BLOOD GLUCOSE TEST STRIPS) STRP 1 each by In Vitro route in the morning, at noon, and at bedtime. May substitute to any manufacturer covered by patient's insurance. 100 strip 0   Lancet Device MISC 1 each by Does not apply route in the morning,  at noon, and at bedtime. May substitute to any manufacturer covered by patient's insurance. 1 each 0   Lancets Misc. MISC 1 each by Does not apply route in the morning, at noon, and at bedtime. May substitute to any manufacturer covered by patient's insurance. 100 each 0   lisinopril (ZESTRIL) 40 MG tablet Take 1 tablet (40 mg total) by mouth daily. 90 tablet 0   loratadine (CLARITIN) 10 MG tablet Take 1 tablet by mouth daily. 90 tablet 1   metoprolol succinate (TOPROL-XL) 25 MG 24 hr tablet TAKE 1 TABLET (25 MG TOTAL) BY MOUTH DAILY. 90 tablet 0   montelukast (SINGULAIR)  10 MG tablet Take 1 tablet by mouth daily at bedtime. 90 tablet 0   Multiple Vitamin (MULTIVITAMIN) tablet Take 1 tablet by mouth daily.     OneTouch Delica Lancets 30G MISC 1 each by Does not apply route daily. Dx E11.9 100 each 3   Oxycodone HCl 10 MG TABS Take 1 tablet (10 mg total) by mouth 2 (two) times daily as needed. 60 tablet 0   Oxycodone HCl 10 MG TABS Take 1 tablet (10 mg total) by mouth 2 (two) times daily as needed. 60 tablet 0   Oxycodone HCl 10 MG TABS Take 1 tablet (10 mg total) by mouth 2 (two) times daily as needed. 60 tablet 0   Rimegepant Sulfate (NURTEC) 75 MG TBDP Take 75 mg by mouth daily as needed. Onset migraine (max one daily) 10 tablet 11   Semaglutide, 2 MG/DOSE, (OZEMPIC, 2 MG/DOSE,) 8 MG/3ML SOPN INJECT 2 MGS SUBCUTANEOUSLY AS DIRECTED ONCE A WEEK 9 mL 0   spironolactone (ALDACTONE) 100 MG tablet Take 1 tablet (100 mg total) by mouth 2 (two) times daily. 180 tablet 0   traZODone (DESYREL) 50 MG tablet TAKE 1 TO 2 TABLETS BY MOUTH AT BEDTIME AS NEEDED SLEEP 180 tablet 0   No current facility-administered medications on file prior to visit.     Allergies  Allergen Reactions   Iodinated Contrast Media Swelling, Cough, Other (See Comments), Itching, Photosensitivity, Shortness Of Breath and Anaphylaxis    Migraine instantly  update, Migraine instantly  update    update update update  update Migraine instantly    update Migraine instantly update Migraine instantly   Metrizamide Other (See Comments), Itching, Photosensitivity, Shortness Of Breath and Swelling    update Migraine instantly   Mushroom Extract Complex Itching    Throat itching with cough   Peanut Oil Anaphylaxis and Other (See Comments)    update   Peanut-Containing Drug Products Itching, Anaphylaxis and Other (See Comments)    Itching throat with cough  update   Diflucan In Dextrose [Fluconazole In Dextrose] Hives   Iodine-131 Other (See Comments)    update    Social History    Occupational History   Occupation: disability  Tobacco Use   Smoking status: Never   Smokeless tobacco: Never  Vaping Use   Vaping Use: Never used  Substance and Sexual Activity   Alcohol use: No   Drug use: No   Sexual activity: Not Currently    Birth control/protection: Condom    Family History  Problem Relation Age of Onset   Pulmonary Hypertension Mother    Congestive Heart Failure Mother    Hypertension Sister    Obesity Sister    Diabetes Maternal Grandmother    Migraines Neg Hx     Immunization History  Administered Date(s) Administered   Influenza Inj Mdck Quad Pf 02/14/2016  Influenza,inj,Quad PF,6+ Mos 04/28/2017   Influenza,inj,quad, With Preservative 02/14/2016   Influenza-Unspecified 02/14/2016, 02/14/2016, 11/04/2019, 11/25/2020   Moderna Sars-Covid-2 Vaccination 05/21/2019, 06/25/2019, 02/14/2020, 12/27/2020   Pneumococcal Polysaccharide-23 04/18/2016   Tdap 02/25/2014    Review of systems: Positive Findings in bold print.  Constitutional:  chills, fatigue, fever, sweats, weight change Communication: Nurse, learning disability, sign Presenter, broadcasting, hand writing, iPad/Android device Head: headaches, head injury Eyes: changes in vision, eye pain, glaucoma, cataracts, macular degeneration, diplopia, glare,  light sensitivity, eyeglasses or contacts, blindness Ears nose mouth throat: hearing impaired, hearing aids,  ringing in ears, deaf, sign language,  vertigo, nosebleeds,  rhinitis,  cold sores, snoring, swollen glands Cardiovascular: HTN, edema, arrhythmia, pacemaker in place, defibrillator in place, chest pain/tightness, chronic anticoagulation, blood clot, heart failure, MI Peripheral Vascular: leg cramps, varicose veins, blood clots, lymphedema, varicosities Respiratory:  asthma, difficulty breathing, denies congestion, SOB, wheezing, cough, emphysema Gastrointestinal: change in appetite or weight, abdominal pain, constipation, diarrhea, nausea, vomiting,  vomiting blood, change in bowel habits, abdominal pain, jaundice, rectal bleeding, hemorrhoids, GERD Genitourinary:  nocturia,  pain on urination, polyuria,  blood in urine, Foley catheter, urinary urgency, ESRD on hemodialysis Musculoskeletal: amputation, cramping, stiff joints, painful joints, decreased joint motion, fractures, OA, gout, hemiplegia, paraplegia, uses cane, wheelchair bound, uses walker, uses rollator Skin: +changes in toenails, color change, dryness, itching, mole changes,  rash, wound(s) Neurological: headaches, numbness in feet, paresthesias in feet, burning in feet, fainting,  seizures, change in speech, migraines, memory problems/poor historian, cerebral palsy, weakness, paralysis, CVA, TIA Endocrine: diabetes, hypothyroidism, hyperthyroidism,  goiter, dry mouth, flushing, heat intolerance, cold intolerance,  excessive thirst, denies polyuria,  nocturia Hematological:  easy bleeding, excessive bleeding, easy bruising, enlarged lymph nodes, on long term blood thinner, history of past transusions Allergy/immunological:  hives, eczema, frequent infections, multiple drug allergies, seasonal allergies, transplant recipient, multiple food allergies Psychiatric:  anxiety, depression, mood disorder, suicidal ideations, hallucinations, insomnia  Objective: There were no vitals filed for this visit. Vascular Examination: Capillary refill time less than 3 seconds x 10 digits.  Dorsalis pedis pulses palpable 2 out of 4.  Posterior tibial pulses palpable 2 out of 4.  Digital hair not present x 10 digits.  Skin temperature gradient WNL b/l.  Dermatological Examination: Skin with normal turgor, texture and tone b/l  Toenails 1-5 b/l discolored, thick, dystrophic with subungual debris and pain with palpation to nailbeds due to thickness of nails.  Musculoskeletal: Muscle strength 5/5 to all LE muscle groups.  Neurological: Sensation intact with 10 gram monofilament.  Vibratory  sensation intact.  Assessment: NIDDM Encounter for diabetic foot examination Xerosis skin Hammertoe bilateral  Plan: Discussed diabetic foot care principles. Literature dispensed on today. Patient to continue soft, supportive shoe gear daily. Patient to report any pedal injuries to medical professional immediately. Follow up one year. Patient/POA to call should there be a concern in the interim. I explained to the patient the etiology of xerosis and various treatment options were extensively discussed.  I explained to the patient the importance of maintaining moisturization of the skin with application of over-the-counter lotion such as Eucerin or Luciderm.  Given that she has failed over-the-counter medication she will benefit from ammonium lactate I have asked her to apply twice a day. She will be scheduled for diabetic shoes

## 2022-06-21 ENCOUNTER — Other Ambulatory Visit (HOSPITAL_COMMUNITY): Payer: Self-pay

## 2022-06-21 ENCOUNTER — Telehealth: Payer: Self-pay | Admitting: *Deleted

## 2022-06-21 NOTE — Telephone Encounter (Signed)
Vanessa [name] w/ [facility] stated patient doesn't have her next Botox appt scheduled. Please reach out to patient and offer to schedule her next Botox appointment. Her last injection was on 04/10/22. Her next one should be on or after 06/03/22.

## 2022-06-24 NOTE — Telephone Encounter (Signed)
Pt is working on getting her insurance to cover the bill and will call us back to schedule once it has been taken care of.

## 2022-06-24 NOTE — Telephone Encounter (Signed)
Noted  

## 2022-06-28 ENCOUNTER — Other Ambulatory Visit: Payer: Self-pay | Admitting: Family Medicine

## 2022-06-28 DIAGNOSIS — E1165 Type 2 diabetes mellitus with hyperglycemia: Secondary | ICD-10-CM

## 2022-06-30 ENCOUNTER — Other Ambulatory Visit: Payer: Self-pay | Admitting: Family Medicine

## 2022-06-30 DIAGNOSIS — E1165 Type 2 diabetes mellitus with hyperglycemia: Secondary | ICD-10-CM

## 2022-07-04 ENCOUNTER — Other Ambulatory Visit (HOSPITAL_COMMUNITY): Payer: Self-pay

## 2022-07-08 ENCOUNTER — Other Ambulatory Visit: Payer: Self-pay | Admitting: Family Medicine

## 2022-07-08 DIAGNOSIS — E1165 Type 2 diabetes mellitus with hyperglycemia: Secondary | ICD-10-CM

## 2022-07-18 ENCOUNTER — Other Ambulatory Visit: Payer: Self-pay | Admitting: Family Medicine

## 2022-07-18 ENCOUNTER — Ambulatory Visit (INDEPENDENT_AMBULATORY_CARE_PROVIDER_SITE_OTHER): Payer: 59

## 2022-07-18 DIAGNOSIS — E1165 Type 2 diabetes mellitus with hyperglycemia: Secondary | ICD-10-CM

## 2022-07-18 DIAGNOSIS — M2042 Other hammer toe(s) (acquired), left foot: Secondary | ICD-10-CM

## 2022-07-18 DIAGNOSIS — J302 Other seasonal allergic rhinitis: Secondary | ICD-10-CM

## 2022-07-18 DIAGNOSIS — M2041 Other hammer toe(s) (acquired), right foot: Secondary | ICD-10-CM

## 2022-07-18 NOTE — Progress Notes (Signed)
Patient presents to the office today for diabetic shoe and insole measuring.   ABN signed.   Documentation of medical necessity will be sent to patient's treating diabetic doctor to verify and sign.   Patient's diabetic provider: Sonny Masters, FNP   Garlan Fair, DO   Shoes and insoles will be ordered at that time and patient will be notified for an appointment for fitting when they arrive.   Patient shoe selection-   1st   Shoe choice:   NB [id number]  Shoe size ordered: 12

## 2022-07-24 ENCOUNTER — Telehealth: Payer: 59 | Admitting: Physician Assistant

## 2022-07-24 DIAGNOSIS — R3989 Other symptoms and signs involving the genitourinary system: Secondary | ICD-10-CM

## 2022-07-24 DIAGNOSIS — R252 Cramp and spasm: Secondary | ICD-10-CM

## 2022-07-24 MED ORDER — SULFAMETHOXAZOLE-TRIMETHOPRIM 800-160 MG PO TABS
1.00 | ORAL_TABLET | Freq: Two times a day (BID) | ORAL | 0 refills | Status: DC
Start: 2022-07-24 — End: 2022-09-03

## 2022-07-24 NOTE — Progress Notes (Signed)
Virtual Visit Consent   Vanessa [name], you are scheduled for a virtual visit with a [facility] provider today. Just as with appointments in the office, your consent must be obtained to participate. Your consent will be active for this visit and any virtual visit you may have with one of our providers in the next 365 days. If you have a MyChart account, a copy of this consent can be sent to you electronically.  As this is a virtual visit, video technology does not allow for your provider to perform a traditional examination. This may limit your provider's ability to fully assess your condition. If your provider identifies any concerns that need to be evaluated in person or the need to arrange testing (such as labs, EKG, etc.), we will make arrangements to do so. Although advances in technology are sophisticated, we cannot ensure that it will always work on either your end or our end. If the connection with a video visit is poor, the visit may have to be switched to a telephone visit. With either a video or telephone visit, we are not always able to ensure that we have a secure connection.  By engaging in this virtual visit, you consent to the provision of healthcare and authorize for your insurance to be billed (if applicable) for the services provided during this visit. Depending on your insurance coverage, you may receive a charge related to this service.  I need to obtain your verbal consent now. Are you willing to proceed with your visit today? Vanessa [name] has provided verbal consent on 07/24/2022 for a virtual visit (video or telephone). Vanessa Loveless, PA-C  Date: 07/24/2022 8:22 AM  Virtual Visit via Video Note   I, Vanessa [name], connected with  Vanessa [name]  ([id number], 1976/01/07) on 07/24/22 at  8:15 AM EDT by a video-enabled telemedicine application and verified that I am speaking with the correct person using two identifiers.  Location: Patient: Virtual Visit Location  Patient: Home Provider: Virtual Visit Location Provider: Home Office   I discussed the limitations of evaluation and management by telemedicine and the availability of in person appointments. The patient expressed understanding and agreed to proceed.    History of Present Illness: Vanessa [name] is a 47 y.o. who identifies as a female who was assigned female at birth, and is being seen today for possible UTI.  HPI: Urinary Tract Infection  This is a new problem. The current episode started in the past 7 days (2 days ago). The problem occurs every urination. The problem has been gradually worsening. The quality of the pain is described as burning and aching. The pain is moderate. There has been no fever. There is No history of pyelonephritis. Associated symptoms include frequency, hesitancy and urgency. Pertinent negatives include no chills, discharge, flank pain, hematuria, nausea or vomiting. Treatments tried: AZO tablets. The treatment provided no relief.    Reports starting to also have increased leg cramping. Occurs anytime.  Problems:  Patient Active Problem List   Diagnosis Date Noted   Allergy to alpha-gal 05/21/2021   Seasonal allergies 01/04/2021   Type 2 diabetes mellitus with hyperglycemia, without long-term current use of insulin (HCC) 01/04/2021   Controlled substance agreement signed 04/06/2020   Chronic migraine without aura without status migrainosus, not intractable 10/05/2019   Bilateral primary osteoarthritis of knee 08/11/2019   Bilateral pes planus 08/11/2019   Hyperlipidemia associated with type 2 diabetes mellitus (HCC) 07/05/2019   PCOS (polycystic ovarian syndrome) 03/04/2016   Morbid  obesity with BMI of 45.0-49.9, adult (HCC) 11/21/2015   S/P ventriculoperitoneal shunt 10/25/2015   IIH (idiopathic intracranial hypertension) 09/21/2015   Blindness of right eye 08/14/2015   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    Allergies:  Allergies  Allergen Reactions    Iodinated Contrast Media Swelling, Cough, Other (See Comments), Itching, Photosensitivity, Shortness Of Breath and Anaphylaxis    Migraine instantly  update, Migraine instantly  update    update update update  update Migraine instantly    update Migraine instantly update Migraine instantly   Metrizamide Other (See Comments), Itching, Photosensitivity, Shortness Of Breath and Swelling    update Migraine instantly   Mushroom Extract Complex Itching    Throat itching with cough   Peanut Oil Anaphylaxis and Other (See Comments)    update   Peanut-Containing Drug Products Itching, Anaphylaxis and Other (See Comments)    Itching throat with cough  update   Diflucan In Dextrose [Fluconazole In Dextrose] Hives   Iodine-131 Other (See Comments)    update   Medications:  Current Outpatient Medications:    sulfamethoxazole-trimethoprim (BACTRIM DS) 800-160 MG tablet, Take 1 tablet by mouth 2 (two) times daily., Disp: 10 tablet, Rfl: 0   acetaZOLAMIDE (DIAMOX) 125 MG tablet, Take 2 tablets (250 mg total) by mouth 2 (two) times daily., Disp: 60 tablet, Rfl: 11   amitriptyline (ELAVIL) 50 MG tablet, Take 1 tablet by mouth daily., Disp: 90 tablet, Rfl: 0   atorvastatin (LIPITOR) 80 MG tablet, TAKE 1 TABLET BY MOUTH EVERY DAY, Disp: 90 tablet, Rfl: 0   Blood Glucose Monitoring Suppl DEVI, 1 each by Does not apply route in the morning, at noon, and at bedtime. May substitute to any manufacturer covered by patient's insurance., Disp: 1 each, Rfl: 0   botulinum toxin Type A (BOTOX) 200 units injection, Inject 200 Units into the muscle every 3 (three) months. To be administer by provider and dispose any remainder., Disp: 1 each, Rfl: 2   cyclobenzaprine (FLEXERIL) 10 MG tablet, TAKE 1 TABLET BY MOUTH THREE TIMES A DAY AS NEEDED FOR MUSCLE SPASMS, Disp: 60 tablet, Rfl: 2   dicyclomine (BENTYL) 20 MG tablet, Take 20 mg by mouth 2 (two) times daily., Disp: , Rfl:    empagliflozin (JARDIANCE) 25 MG TABS  tablet, Take 1 tablet (25 mg total) by mouth daily before breakfast., Disp: 90 tablet, Rfl: 0   EPINEPHrine (EPIPEN 2-PAK) 0.3 mg/0.3 mL IJ SOAJ injection, Inject 0.3 mg into the muscle as needed for anaphylaxis., Disp: 2 each, Rfl: 1   ferrous sulfate (FE TABS) 325 (65 FE) MG EC tablet, Take 1 tablet (325 mg total) by mouth daily with breakfast., Disp: 60 tablet, Rfl: 1   flunisolide (NASALIDE) 25 MCG/ACT (0.025%) SOLN, Instill 2 sprays into each nostril twice daily., Disp: 25 mL, Rfl: 0   Fremanezumab-vfrm (AJOVY) 225 MG/1.5ML SOSY, Inject 1.5 mLs into the skin every 30 (thirty) days., Disp: 4.5 mL, Rfl: 3   furosemide (LASIX) 40 MG tablet, Take 1 tablet (40 mg total) by mouth daily., Disp: 90 tablet, Rfl: 1   glucose blood (ONETOUCH ULTRA) test strip, DX:E11.65, Disp: 100 strip, Rfl: 0   Lancets (ONETOUCH DELICA PLUS LANCET33G) MISC, Check BS 3 times a day Dx E11.65, Disp: 300 each, Rfl: 3   lisinopril (ZESTRIL) 40 MG tablet, Take 1 tablet (40 mg total) by mouth daily., Disp: 90 tablet, Rfl: 0   loratadine (CLARITIN) 10 MG tablet, Take 1 tablet by mouth daily., Disp: 90 tablet, Rfl: 1  metoprolol succinate (TOPROL-XL) 25 MG 24 hr tablet, TAKE 1 TABLET (25 MG TOTAL) BY MOUTH DAILY., Disp: 90 tablet, Rfl: 0   montelukast (SINGULAIR) 10 MG tablet, Take 1 tablet by mouth daily at bedtime., Disp: 90 tablet, Rfl: 0   Multiple Vitamin (MULTIVITAMIN) tablet, Take 1 tablet by mouth daily., Disp: , Rfl:    Oxycodone HCl 10 MG TABS, Take 1 tablet (10 mg total) by mouth 2 (two) times daily as needed., Disp: 60 tablet, Rfl: 0   Oxycodone HCl 10 MG TABS, Take 1 tablet (10 mg total) by mouth 2 (two) times daily as needed., Disp: 60 tablet, Rfl: 0   Oxycodone HCl 10 MG TABS, Take 1 tablet (10 mg total) by mouth 2 (two) times daily as needed., Disp: 60 tablet, Rfl: 0   Rimegepant Sulfate (NURTEC) 75 MG TBDP, Take 75 mg by mouth daily as needed. Onset migraine (max one daily), Disp: 10 tablet, Rfl: 11    Semaglutide, 2 MG/DOSE, (OZEMPIC, 2 MG/DOSE,) 8 MG/3ML SOPN, INJECT 2 MGS SUBCUTANEOUSLY AS DIRECTED ONCE A WEEK, Disp: 9 mL, Rfl: 0   spironolactone (ALDACTONE) 100 MG tablet, Take 1 tablet (100 mg total) by mouth 2 (two) times daily., Disp: 180 tablet, Rfl: 0   traZODone (DESYREL) 50 MG tablet, TAKE 1 TO 2 TABLETS BY MOUTH AT BEDTIME AS NEEDED SLEEP, Disp: 180 tablet, Rfl: 0  Observations/Objective: Patient is well-developed, well-nourished in no acute distress.  Resting comfortably at home.  Head is normocephalic, atraumatic.  No labored breathing.  Speech is clear and coherent with logical content.  Patient is alert and oriented at baseline.    Assessment and Plan: 1. Suspected UTI - sulfamethoxazole-trimethoprim (BACTRIM DS) 800-160 MG tablet; Take 1 tablet by mouth 2 (two) times daily.  Dispense: 10 tablet; Refill: 0  2. Leg cramping  - Worsening symptoms.  - Will treat empirically with Bactrim - May use AZO for bladder spasms - Continue to push fluids.  - Seek in person evaluation for urine culture if symptoms do not improve or if they worsen.   - Magnesium sulfate 250mg  OTC for leg cramping - Follow up in person for evaluation and labs if cramping persists.  Follow Up Instructions: I discussed the assessment and treatment plan with the patient. The patient was provided an opportunity to ask questions and all were answered. The patient agreed with the plan and demonstrated an understanding of the instructions.  A copy of instructions were sent to the patient via MyChart unless otherwise noted below.    The patient was advised to call back or seek an in-person evaluation if the symptoms worsen or if the condition fails to improve as anticipated.  Time:  I spent 8 minutes with the patient via telehealth technology discussing the above problems/concerns.    Vanessa Loveless, PA-C

## 2022-07-24 NOTE — Patient Instructions (Signed)
Vanessa [name], thank you for joining Vanessa Loveless, PA-C for today's virtual visit.  While this provider is not your primary care provider (PCP), if your PCP is located in our provider database this encounter information will be shared with them immediately following your visit.   A [facility] MyChart account gives you access to today's visit and all your visits, tests, and labs performed at [facility] " click here if you don't have a [facility] MyChart account or go to [email or web address]  Consent: (Patient) Vanessa [name] provided verbal consent for this virtual visit at the beginning of the encounter.  Current Medications:  Current Outpatient Medications:    sulfamethoxazole-trimethoprim (BACTRIM DS) 800-160 MG tablet, Take 1 tablet by mouth 2 (two) times daily., Disp: 10 tablet, Rfl: 0   acetaZOLAMIDE (DIAMOX) 125 MG tablet, Take 2 tablets (250 mg total) by mouth 2 (two) times daily., Disp: 60 tablet, Rfl: 11   amitriptyline (ELAVIL) 50 MG tablet, Take 1 tablet by mouth daily., Disp: 90 tablet, Rfl: 0   atorvastatin (LIPITOR) 80 MG tablet, TAKE 1 TABLET BY MOUTH EVERY DAY, Disp: 90 tablet, Rfl: 0   Blood Glucose Monitoring Suppl DEVI, 1 each by Does not apply route in the morning, at noon, and at bedtime. May substitute to any manufacturer covered by patient's insurance., Disp: 1 each, Rfl: 0   botulinum toxin Type A (BOTOX) 200 units injection, Inject 200 Units into the muscle every 3 (three) months. To be administer by provider and dispose any remainder., Disp: 1 each, Rfl: 2   cyclobenzaprine (FLEXERIL) 10 MG tablet, TAKE 1 TABLET BY MOUTH THREE TIMES A DAY AS NEEDED FOR MUSCLE SPASMS, Disp: 60 tablet, Rfl: 2   dicyclomine (BENTYL) 20 MG tablet, Take 20 mg by mouth 2 (two) times daily., Disp: , Rfl:    empagliflozin (JARDIANCE) 25 MG TABS tablet, Take 1 tablet (25 mg total) by mouth daily before breakfast., Disp: 90 tablet, Rfl: 0   EPINEPHrine (EPIPEN 2-PAK)  0.3 mg/0.3 mL IJ SOAJ injection, Inject 0.3 mg into the muscle as needed for anaphylaxis., Disp: 2 each, Rfl: 1   ferrous sulfate (FE TABS) 325 (65 FE) MG EC tablet, Take 1 tablet (325 mg total) by mouth daily with breakfast., Disp: 60 tablet, Rfl: 1   flunisolide (NASALIDE) 25 MCG/ACT (0.025%) SOLN, Instill 2 sprays into each nostril twice daily., Disp: 25 mL, Rfl: 0   Fremanezumab-vfrm (AJOVY) 225 MG/1.5ML SOSY, Inject 1.5 mLs into the skin every 30 (thirty) days., Disp: 4.5 mL, Rfl: 3   furosemide (LASIX) 40 MG tablet, Take 1 tablet (40 mg total) by mouth daily., Disp: 90 tablet, Rfl: 1   glucose blood (ONETOUCH ULTRA) test strip, DX:E11.65, Disp: 100 strip, Rfl: 0   Lancets (ONETOUCH DELICA PLUS LANCET33G) MISC, Check BS 3 times a day Dx E11.65, Disp: 300 each, Rfl: 3   lisinopril (ZESTRIL) 40 MG tablet, Take 1 tablet (40 mg total) by mouth daily., Disp: 90 tablet, Rfl: 0   loratadine (CLARITIN) 10 MG tablet, Take 1 tablet by mouth daily., Disp: 90 tablet, Rfl: 1   metoprolol succinate (TOPROL-XL) 25 MG 24 hr tablet, TAKE 1 TABLET (25 MG TOTAL) BY MOUTH DAILY., Disp: 90 tablet, Rfl: 0   montelukast (SINGULAIR) 10 MG tablet, Take 1 tablet by mouth daily at bedtime., Disp: 90 tablet, Rfl: 0   Multiple Vitamin (MULTIVITAMIN) tablet, Take 1 tablet by mouth daily., Disp: , Rfl:    Oxycodone HCl 10 MG TABS, Take 1 tablet (10 mg  total) by mouth 2 (two) times daily as needed., Disp: 60 tablet, Rfl: 0   Oxycodone HCl 10 MG TABS, Take 1 tablet (10 mg total) by mouth 2 (two) times daily as needed., Disp: 60 tablet, Rfl: 0   Oxycodone HCl 10 MG TABS, Take 1 tablet (10 mg total) by mouth 2 (two) times daily as needed., Disp: 60 tablet, Rfl: 0   Rimegepant Sulfate (NURTEC) 75 MG TBDP, Take 75 mg by mouth daily as needed. Onset migraine (max one daily), Disp: 10 tablet, Rfl: 11   Semaglutide, 2 MG/DOSE, (OZEMPIC, 2 MG/DOSE,) 8 MG/3ML SOPN, INJECT 2 MGS SUBCUTANEOUSLY AS DIRECTED ONCE A WEEK, Disp: 9 mL, Rfl:  0   spironolactone (ALDACTONE) 100 MG tablet, Take 1 tablet (100 mg total) by mouth 2 (two) times daily., Disp: 180 tablet, Rfl: 0   traZODone (DESYREL) 50 MG tablet, TAKE 1 TO 2 TABLETS BY MOUTH AT BEDTIME AS NEEDED SLEEP, Disp: 180 tablet, Rfl: 0   Medications ordered in this encounter:  Meds ordered this encounter  Medications   sulfamethoxazole-trimethoprim (BACTRIM DS) 800-160 MG tablet    Sig: Take 1 tablet by mouth 2 (two) times daily.    Dispense:  10 tablet    Refill:  0    Order Specific Question:   Supervising Provider    Answer:   Merrilee Jansky [id number]     *If you need refills on other medications prior to your next appointment, please contact your pharmacy*  Follow-Up: Call back or seek an in-person evaluation if the symptoms worsen or if the condition fails to improve as anticipated.  [facility] [phone]  Other Instructions Urinary Tract Infection, Adult  A urinary tract infection (UTI) is an infection of any part of the urinary tract. The urinary tract includes the kidneys, ureters, bladder, and urethra. These organs make, store, and get rid of urine in the body. An upper UTI affects the ureters and kidneys. A lower UTI affects the bladder and urethra. What are the causes? Most urinary tract infections are caused by bacteria in your genital area around your urethra, where urine leaves your body. These bacteria grow and cause inflammation of your urinary tract. What increases the risk? You are more likely to develop this condition if: You have a urinary catheter that stays in place. You are not able to control when you urinate or have a bowel movement (incontinence). You are female and you: Use a spermicide or diaphragm for birth control. Have low estrogen levels. Are pregnant. You have certain genes that increase your risk. You are sexually active. You take antibiotic medicines. You have a condition that causes your flow of urine to  slow down, such as: An enlarged prostate, if you are female. Blockage in your urethra. A kidney stone. A nerve condition that affects your bladder control (neurogenic bladder). Not getting enough to drink, or not urinating often. You have certain medical conditions, such as: Diabetes. A weak disease-fighting system (immunesystem). Sickle cell disease. Gout. Spinal cord injury. What are the signs or symptoms? Symptoms of this condition include: Needing to urinate right away (urgency). Frequent urination. This may include small amounts of urine each time you urinate. Pain or burning with urination. Blood in the urine. Urine that smells bad or unusual. Trouble urinating. Cloudy urine. Vaginal discharge, if you are female. Pain in the abdomen or the lower back. You may also have: Vomiting or a decreased appetite. Confusion. Irritability or tiredness. A fever or chills. Diarrhea.  The first symptom in older adults may be confusion. In some cases, they may not have any symptoms until the infection has worsened. How is this diagnosed? This condition is diagnosed based on your medical history and a physical exam. You may also have other tests, including: Urine tests. Blood tests. Tests for STIs (sexually transmitted infections). If you have had more than one UTI, a cystoscopy or imaging studies may be done to determine the cause of the infections. How is this treated? Treatment for this condition includes: Antibiotic medicine. Over-the-counter medicines to treat discomfort. Drinking enough water to stay hydrated. If you have frequent infections or have other conditions such as a kidney stone, you may need to see a health care provider who specializes in the urinary tract (urologist). In rare cases, urinary tract infections can cause sepsis. Sepsis is a life-threatening condition that occurs when the body responds to an infection. Sepsis is treated in the hospital with IV antibiotics,  fluids, and other medicines. Follow these instructions at home:  Medicines Take over-the-counter and prescription medicines only as told by your health care provider. If you were prescribed an antibiotic medicine, take it as told by your health care provider. Do not stop using the antibiotic even if you start to feel better. General instructions Make sure you: Empty your bladder often and completely. Do not hold urine for long periods of time. Empty your bladder after sex. Wipe from front to back after urinating or having a bowel movement if you are female. Use each tissue only one time when you wipe. Drink enough fluid to keep your urine pale yellow. Keep all follow-up visits. This is important. Contact a health care provider if: Your symptoms do not get better after 1-2 days. Your symptoms go away and then return. Get help right away if: You have severe pain in your back or your lower abdomen. You have a fever or chills. You have nausea or vomiting. Summary A urinary tract infection (UTI) is an infection of any part of the urinary tract, which includes the kidneys, ureters, bladder, and urethra. Most urinary tract infections are caused by bacteria in your genital area. Treatment for this condition often includes antibiotic medicines. If you were prescribed an antibiotic medicine, take it as told by your health care provider. Do not stop using the antibiotic even if you start to feel better. Keep all follow-up visits. This is important. This information is not intended to replace advice given to you by your health care provider. Make sure you discuss any questions you have with your health care provider. Document Revised: 09/19/2019 Document Reviewed: 09/24/2019 Elsevier Patient Education  2024 [employer]    If you have been instructed to have an in-person evaluation today at a local Urgent Care facility, please use the link below. It will take you to a list of all of our  available [facility], including address, phone number and hours of operation. Please do not delay care.  [facility]  If you or a family member do not have a primary care provider, use the link below to schedule a visit and establish care. When you choose a [facility] primary care physician or advanced practice provider, you gain a long-term partner in health. Find a Primary Care Provider  Learn more about [facility]'s in-office and virtual care options: [facility] Get Care Now

## 2022-08-01 ENCOUNTER — Other Ambulatory Visit (HOSPITAL_COMMUNITY): Payer: Self-pay

## 2022-08-13 ENCOUNTER — Ambulatory Visit (INDEPENDENT_AMBULATORY_CARE_PROVIDER_SITE_OTHER): Payer: 59 | Admitting: Podiatry

## 2022-08-13 DIAGNOSIS — M2042 Other hammer toe(s) (acquired), left foot: Secondary | ICD-10-CM

## 2022-08-13 DIAGNOSIS — E1165 Type 2 diabetes mellitus with hyperglycemia: Secondary | ICD-10-CM | POA: Diagnosis not present

## 2022-08-13 DIAGNOSIS — M2041 Other hammer toe(s) (acquired), right foot: Secondary | ICD-10-CM

## 2022-08-13 NOTE — Progress Notes (Signed)
Patient presents today to pick up diabetic shoes and insoles.  Patient was dispensed 1 pair of diabetic shoes and 3 pairs of foam casted diabetic insoles. Fit was satisfactory. Instructions for break-in and wear was reviewed and a copy was given to the patient.   Re-appointment for regularly scheduled diabetic foot care visits or if they should experience any trouble with the shoes or insoles.  

## 2022-08-20 HISTORY — PX: OTHER SURGICAL HISTORY: SHX169

## 2022-08-26 ENCOUNTER — Other Ambulatory Visit: Payer: Self-pay | Admitting: Family Medicine

## 2022-08-26 DIAGNOSIS — F5101 Primary insomnia: Secondary | ICD-10-CM

## 2022-08-26 DIAGNOSIS — E1159 Type 2 diabetes mellitus with other circulatory complications: Secondary | ICD-10-CM

## 2022-09-03 ENCOUNTER — Encounter: Payer: Self-pay | Admitting: Family Medicine

## 2022-09-03 ENCOUNTER — Other Ambulatory Visit: Payer: Self-pay | Admitting: Pharmacist

## 2022-09-03 ENCOUNTER — Ambulatory Visit (INDEPENDENT_AMBULATORY_CARE_PROVIDER_SITE_OTHER): Payer: 59 | Admitting: Family Medicine

## 2022-09-03 VITALS — BP 138/77 | HR 92 | Temp 98.0°F | Ht 70.0 in | Wt 324.8 lb

## 2022-09-03 DIAGNOSIS — G43709 Chronic migraine without aura, not intractable, without status migrainosus: Secondary | ICD-10-CM | POA: Diagnosis not present

## 2022-09-03 DIAGNOSIS — E1165 Type 2 diabetes mellitus with hyperglycemia: Secondary | ICD-10-CM

## 2022-09-03 DIAGNOSIS — E1169 Type 2 diabetes mellitus with other specified complication: Secondary | ICD-10-CM

## 2022-09-03 DIAGNOSIS — E785 Hyperlipidemia, unspecified: Secondary | ICD-10-CM

## 2022-09-03 DIAGNOSIS — E1159 Type 2 diabetes mellitus with other circulatory complications: Secondary | ICD-10-CM | POA: Diagnosis not present

## 2022-09-03 DIAGNOSIS — I152 Hypertension secondary to endocrine disorders: Secondary | ICD-10-CM

## 2022-09-03 DIAGNOSIS — F5101 Primary insomnia: Secondary | ICD-10-CM

## 2022-09-03 LAB — BAYER DCA HB A1C WAIVED: HB A1C (BAYER DCA - WAIVED): 5.9 % — ABNORMAL HIGH (ref 4.8–5.6)

## 2022-09-03 MED ORDER — FREESTYLE LIBRE 3 SENSOR MISC
10 refills | Status: AC
Start: 2022-09-03 — End: ?

## 2022-09-03 MED ORDER — AMITRIPTYLINE HCL 50 MG PO TABS: 50.0000 mg | ORAL_TABLET | Freq: Every day | ORAL | 1 refills | Status: DC

## 2022-09-03 MED ORDER — METOPROLOL SUCCINATE ER 25 MG PO TB24
25.0000 mg | ORAL_TABLET | Freq: Every day | ORAL | 1 refills | Status: DC
Start: 2022-09-03 — End: 2023-03-09

## 2022-09-03 MED ORDER — OZEMPIC (2 MG/DOSE) 8 MG/3ML ~~LOC~~ SOPN
PEN_INJECTOR | SUBCUTANEOUS | 1 refills | Status: DC
Start: 2022-09-03 — End: 2023-03-21

## 2022-09-03 MED ORDER — ATORVASTATIN CALCIUM 80 MG PO TABS
80.0000 mg | ORAL_TABLET | Freq: Every day | ORAL | 1 refills | Status: DC
Start: 2022-09-03 — End: 2023-02-25

## 2022-09-03 NOTE — Progress Notes (Signed)
Subjective:  Patient ID: Vanessa [name], female    DOB: September 14, 1975, 47 y.o.   MRN: [id number]  Patient Care Team: Sonny Masters, FNP as PCP - General (Family Medicine)   Chief Complaint:  Diabetes (3 month follow up )   HPI: Vanessa [name] is a 47 y.o. female presenting on 09/03/2022 for Diabetes (3 month follow up )    1. Type 2 diabetes mellitus with hyperglycemia, without long-term current use of insulin (HCC) Pt presents for followup evaluation of Type 2 diabetes mellitus.  Current symptoms include none. Patient denies foot ulcerations, hyperglycemia, hyperglycemia no, hypoglycemia , hypoglycemia no, paresthesia of the feet, polydipsia, and polyuria. Current diabetic medications include Compliant with Ozempic and Jardiance.  Compliant with meds - Yes Current monitoring regimen: homecbg  - 3x daily Any episodes of hypoglycemia? no Known diabetic complications: none Cardiovascular risk factors: none Eye exam current (within one year): yes Podiatry yearly?  Yes Weight trend: stable Current diet: in general, a "healthy" diet   Current exercise: none Is She on ACE inhibitor or angiotensin II receptor blocker?  Yes zestril Is She on statin? Yes lipitor Is She on ASA 81 mg daily?  No   2. Hyperlipidemia associated with type 2 diabetes mellitus (HCC) Compliant with medications - Yes Current medications - atorvastatin Side effects from medications - No Diet - does not follow a strict diet Exercise - none  3. Hypertension associated with type 2 diabetes mellitus (HCC) Complaint with meds - Yes Current Medications - Diamox, Lasix, Aldactone, Toprol, Lisinopril Checking BP at home - No Exercising Regularly - No Watching Salt intake - No Pertinent ROS:  Headache - Yes Fatigue - No Visual Disturbances - chronic changes Chest pain - No Dyspnea - No Palpitations - No LE edema - no  BP Readings from Last 3 Encounters:  09/03/22 138/77  05/31/22 136/79  03/01/22 129/76     4. Chronic migraine without aura without status migrainosus, not intractable Compliant  with botox injections every 3 months, amitrityline, diamox, and Rimegepant PRN  5. Insomnia Pt only sleeping 3 hours at a time with trazadone. Tried taking 2 tablets (100mg ) without improvement and just mad groggy th next day. Pt states she wakes up every  hours and never feels like she gets good sleep. Pt states sh limits screen time and has established bedtime routine that starts with warm bath, then reading a book after taking trazadone.   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   Hypertension    Migraines    Pseudotumor (inflammatory) of orbit    Sepsis (HCC) 02/2019   Sleep apnea    Vaginal Pap smear, abnormal    Vision loss     Past Surgical History:  Procedure Laterality Date   arm surgery Left 08/20/2022   BRAIN SURGERY     CSF SHUNT      Social History   Socioeconomic History   Marital status: Single    Spouse name: Not on file   Number of children: 0   Years of education: Not on file   Highest education level: Some college, no degree  Occupational History   Occupation: disability  Tobacco Use   Smoking status: Never   Smokeless tobacco: Never  Vaping Use   Vaping Use: Never used  Substance and Sexual Activity   Alcohol use: No   Drug use: No   Sexual activity: Not Currently    Birth  control/protection: Condom  Other Topics Concern   Not on file  Social History Narrative   Lives with parents   Right handed   Caffeine: maybe 2 cups/day   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   Social Determinants of Health   Financial Resource Strain: Low Risk  (05/27/2022)   Overall Financial Resource Strain (CARDIA)    Difficulty of Paying Living Expenses: Not very hard  Food Insecurity: No Food Insecurity (05/27/2022)   Hunger Vital Sign    Worried About Running Out of Food in the Last Year: Never true    Ran Out of Food in the Last Year:  Never true  Transportation Needs: Unmet Transportation Needs (05/27/2022)   PRAPARE - Transportation    Lack of Transportation (Medical): Yes    Lack of Transportation (Non-Medical): Yes  Physical Activity: Insufficiently Active (05/27/2022)   Exercise Vital Sign    Days of Exercise per Week: 2 days    Minutes of Exercise per Session: 10 min  Stress: No Stress Concern Present (05/27/2022)   [employer] of Occupational Health - Occupational Stress Questionnaire    Feeling of Stress : Only a little  Social Connections: Moderately Isolated (05/27/2022)   Social Connection and Isolation Panel [NHANES]    Frequency of Communication with Friends and Family: Twice a week    Frequency of Social Gatherings with Friends and Family: More than three times a week    Attends Religious Services: 1 to 4 times per year    Active Member of [employer] or Organizations: No    Attends Banker Meetings: Never    Marital Status: Never married  Intimate Partner Violence: Not At Risk (01/25/2022)   Humiliation, Afraid, Rape, and Kick questionnaire    Fear of Current or Ex-Partner: No    Emotionally Abused: No    Physically Abused: No    Sexually Abused: No    Outpatient Encounter Medications as of 09/03/2022  Medication Sig   acetaZOLAMIDE (DIAMOX) 125 MG tablet Take 2 tablets (250 mg total) by mouth 2 (two) times daily.   amitriptyline (ELAVIL) 50 MG tablet Take 1 tablet by mouth daily.   atorvastatin (LIPITOR) 80 MG tablet TAKE 1 TABLET BY MOUTH EVERY DAY   Blood Glucose Monitoring Suppl DEVI 1 each by Does not apply route in the morning, at noon, and at bedtime. May substitute to any manufacturer covered by patient's insurance.   botulinum toxin Type A (BOTOX) 200 units injection Inject 200 Units into the muscle every 3 (three) months. To be administer by provider and dispose any remainder.   cyclobenzaprine (FLEXERIL) 10 MG tablet TAKE 1 TABLET BY MOUTH THREE TIMES A DAY AS NEEDED FOR MUSCLE SPASMS    dicyclomine (BENTYL) 20 MG tablet Take 20 mg by mouth 2 (two) times daily.   empagliflozin (JARDIANCE) 25 MG TABS tablet Take 1 tablet (25 mg total) by mouth daily before breakfast.   EPINEPHrine (EPIPEN 2-PAK) 0.3 mg/0.3 mL IJ SOAJ injection Inject 0.3 mg into the muscle as needed for anaphylaxis.   ferrous sulfate (FE TABS) 325 (65 FE) MG EC tablet Take 1 tablet (325 mg total) by mouth daily with breakfast.   flunisolide (NASALIDE) 25 MCG/ACT (0.025%) SOLN Instill 2 sprays into each nostril twice daily.   Fremanezumab-vfrm (AJOVY) 225 MG/1.5ML SOSY Inject 1.5 mLs into the skin every 30 (thirty) days.   furosemide (LASIX) 40 MG tablet Take 1 tablet (40 mg total) by mouth daily.   glucose blood (ONETOUCH ULTRA) test strip DX:E11.65  Lancets (ONETOUCH DELICA PLUS LANCET33G) MISC Check BS 3 times a day Dx E11.65   lisinopril (ZESTRIL) 40 MG tablet TAKE 1 TABLET BY MOUTH EVERY DAY   loratadine (CLARITIN) 10 MG tablet Take 1 tablet by mouth daily.   metoprolol succinate (TOPROL-XL) 25 MG 24 hr tablet TAKE 1 TABLET (25 MG TOTAL) BY MOUTH DAILY.   montelukast (SINGULAIR) 10 MG tablet Take 1 tablet by mouth daily at bedtime.   Multiple Vitamin (MULTIVITAMIN) tablet Take 1 tablet by mouth daily.   Oxycodone HCl 10 MG TABS Take 1 tablet (10 mg total) by mouth 2 (two) times daily as needed.   Oxycodone HCl 10 MG TABS Take 1 tablet (10 mg total) by mouth 2 (two) times daily as needed.   Oxycodone HCl 10 MG TABS Take 1 tablet (10 mg total) by mouth 2 (two) times daily as needed.   Rimegepant Sulfate (NURTEC) 75 MG TBDP Take 75 mg by mouth daily as needed. Onset migraine (max one daily)   Semaglutide, 2 MG/DOSE, (OZEMPIC, 2 MG/DOSE,) 8 MG/3ML SOPN INJECT 2 MGS SUBCUTANEOUSLY AS DIRECTED ONCE A WEEK   spironolactone (ALDACTONE) 100 MG tablet Take 1 tablet (100 mg total) by mouth 2 (two) times daily.   traZODone (DESYREL) 50 MG tablet TAKE 1 TO 2 TABLETS BY MOUTH AT BEDTIME AS NEEDED SLEEP   [DISCONTINUED]  sulfamethoxazole-trimethoprim (BACTRIM DS) 800-160 MG tablet Take 1 tablet by mouth 2 (two) times daily.   No facility-administered encounter medications on file as of 09/03/2022.    Allergies  Allergen Reactions   Iodinated Contrast Media Swelling, Cough, Other (See Comments), Itching, Photosensitivity, Shortness Of Breath and Anaphylaxis    Migraine instantly  update, Migraine instantly  update    update update update  update Migraine instantly    update Migraine instantly update Migraine instantly   Metrizamide Other (See Comments), Itching, Photosensitivity, Shortness Of Breath and Swelling    update Migraine instantly   Mushroom Extract Complex Itching    Throat itching with cough   Peanut Oil Anaphylaxis and Other (See Comments)    update   Peanut-Containing Drug Products Itching, Anaphylaxis and Other (See Comments)    Itching throat with cough  update   Diflucan In Dextrose [Fluconazole In Dextrose] Hives   Iodine-131 Other (See Comments)    update    Review of Systems  Constitutional:  Negative for activity change, appetite change, chills, diaphoresis, fatigue, fever and unexpected weight change.  HENT: Negative.    Eyes:  Positive for visual disturbance (chronic changes). Negative for photophobia.  Respiratory:  Negative for cough, chest tightness and shortness of breath.   Cardiovascular:  Negative for chest pain, palpitations and leg swelling.  Gastrointestinal:  Negative for abdominal pain, blood in stool, constipation, diarrhea, nausea and vomiting.  Endocrine: Negative.  Negative for polydipsia, polyphagia and polyuria.  Genitourinary:  Negative for decreased urine volume, difficulty urinating, dysuria, frequency and urgency.  Musculoskeletal:  Negative for arthralgias and myalgias.  Skin: Negative.   Allergic/Immunologic: Negative.   Neurological:  Positive for headaches. Negative for dizziness, tremors, seizures, syncope, facial asymmetry, speech  difficulty, weakness, light-headedness and numbness.  Hematological: Negative.   Psychiatric/Behavioral:  Negative for confusion, hallucinations, sleep disturbance and suicidal ideas.   All other systems reviewed and are negative.       Objective:  BP 138/77   Pulse 92   Temp 98 F (36.7 C) (Temporal)   Ht 5\' 10"  (1.778 m)   Wt (!) 324 lb 12.8 oz (147.3  kg)   SpO2 95%   BMI 46.60 kg/m    Wt Readings from Last 3 Encounters:  09/03/22 (!) 324 lb 12.8 oz (147.3 kg)  05/31/22 (!) 314 lb 9.6 oz (142.7 kg)  03/01/22 (!) 315 lb (142.9 kg)    Physical Exam Vitals and nursing note reviewed.  Constitutional:      General: She is not in acute distress.    Appearance: Normal appearance. She is morbidly obese. She is not ill-appearing, toxic-appearing or diaphoretic.  HENT:     Head: Normocephalic and atraumatic.     Mouth/Throat:     Mouth: Mucous membranes are moist.  Eyes:     General: Lids are normal.     Conjunctiva/sclera: Conjunctivae normal.     Comments:  Exotropia of right eye, blind in right eye   Cardiovascular:     Rate and Rhythm: Normal rate and regular rhythm.     Heart sounds: Normal heart sounds.  Pulmonary:     Effort: Pulmonary effort is normal.     Breath sounds: Normal breath sounds.  Abdominal:     General: Abdomen is flat.  Musculoskeletal:     Right lower leg: Edema present.     Left lower leg: Edema present.  Skin:    General: Skin is warm and dry.     Capillary Refill: Capillary refill takes less than 2 seconds.  Neurological:     General: No focal deficit present.     Mental Status: She is alert and oriented to person, place, and time.  Psychiatric:        Mood and Affect: Mood normal.        Behavior: Behavior normal.        Thought Content: Thought content normal.        Judgment: Judgment normal.     Results for orders placed or performed in visit on 06/06/22  HM DIABETES EYE EXAM  Result Value Ref Range   HM Diabetic Eye Exam No  Retinopathy No Retinopathy       Pertinent labs & imaging results that were available during my care of the patient were reviewed by me and considered in my medical decision making.  Assessment & Plan:  Vanessa [name] was seen today for diabetes.  Diagnoses and all orders for this visit:  Type 2 diabetes mellitus with hyperglycemia, without long-term current use of insulin (HCC) A1C well controlled. May be able to stop jardiance if A1C remains below 6.5.  -     Bayer DCA Hb A1c Waived -      evaluate insurance coverage for Continuous blood sugar monitor   Hyperlipidemia associated with type 2 diabetes mellitus (HCC) -     Lipid panel Diet encouraged - increase intake of fresh fruits and vegetables, increase intake of lean proteins. Bake, broil, or grill foods. Avoid fried, greasy, and fatty foods. Avoid fast foods. Increase intake of fiber-rich whole grains. Exercise encouraged - at least 150 minutes per week and advance as tolerated.  Goal BMI < 25. Continue medications as prescribed. Follow up in 3-6 months as discussed.    Essential hypertension & Hypertension associated with type 2 diabetes mellitus (HCC) -     CBC with Differential/Platelet -     CMP14+EGFR BP well controlled. Changes were not made in regimen today. Goal BP is 130/80. Pt aware to report any persistent high or low readings. DASH diet and exercise encouraged. Exercise at least 150 minutes per week and increase as tolerated. Goal BMI >  25. Stress management encouraged. Avoid nicotine and tobacco product use. Avoid excessive alcohol and NSAID's. Avoid more than 2000 mg of sodium daily. Medications as prescribed. Follow up as scheduled.     Insomnia  Continue Trazadone 50 mg HS PRN Add OTC melatonin 3-5mg  HS PRN  Continue all other maintenance medications.  Follow up plan: Return in about 3 months (around 12/04/2022). Eval Tranzadone and sleep next visit  Continue healthy lifestyle choices, including diet (rich in fruits,  vegetables, and lean proteins, and low in salt and simple carbohydrates) and exercise (at least 30 minutes of moderate physical activity daily).  Educational handout given for Dash Diet  The above assessment and management plan was discussed with the patient. The patient verbalized understanding of and has agreed to the management plan. Patient is aware to call the clinic if they develop any new symptoms or if symptoms persist or worsen. Patient is aware when to return to the clinic for a follow-up visit. Patient educated on when it is appropriate to go to the emergency department.   Maryelizabeth Kaufmann NP student [facility] [phone]  I personally was present during the history, physical exam, and medical decision-making activities of this visit and have verified that the services and findings are accurately documented in the nurse practitioner student's note.  Kari Baars, FNP-C [facility] [address] [place], [place] [address] [phone]

## 2022-09-03 NOTE — Progress Notes (Signed)
   09/03/2022 Name: Vanessa [name] MRN: [id number] DOB: 11-21-1975  Patient interested in [place] 3 CGM to test blood sugar continuously.  She is currently not on insulin, however is diligently testing blood sugar 3-4 times daily.  We will send in [place] 3 CGM to the pharmacy to see if covered.  Prior authorization is often needed.  If not covered, Libre 3 sensors are $75/month (for 2 sensors) and phone can be used as "reader device".  Patient's T2DM is well controlled.   Kieth Brightly, PharmD, BCACP Clinical Pharmacist, [facility]

## 2022-09-03 NOTE — Patient Instructions (Addendum)
Add melatonin to trazadone at night  Goal BP:  For patients younger than 60: Goal BP < 140/90. For patients 60 and older: Goal BP < 150/90. For patients with diabetes: Goal BP < 140/90.  Take your medications faithfully as prescribed. Maintain a healthy weight. Get at least 150 minutes of aerobic exercise per week. Minimize salt intake, less than 2000 mg per day. Minimize alcohol intake.  DASH Eating Plan DASH stands for "Dietary Approaches to Stop Hypertension." The DASH eating plan is a healthy eating plan that has been shown to reduce high blood pressure (hypertension). Additional health benefits may include reducing the risk of type 2 diabetes mellitus, heart disease, and stroke. The DASH eating plan may also help with weight loss.  WHAT DO I NEED TO KNOW ABOUT THE DASH EATING PLAN? For the DASH eating plan, you will follow these general guidelines: Choose foods with a percent daily value for sodium of less than 5% (as listed on the food label). Use salt-free seasonings or herbs instead of table salt or sea salt. Check with your health care provider or pharmacist before using salt substitutes. Eat lower-sodium products, often labeled as "lower sodium" or "no salt added." Eat fresh foods. Eat more vegetables, fruits, and low-fat dairy products. Choose whole grains. Look for the word "whole" as the first word in the ingredient list. Choose fish and skinless chicken or [place] more often than red meat. Limit fish, poultry, and meat to 6 oz (170 g) each day. Limit sweets, desserts, sugars, and sugary drinks. Choose heart-healthy fats. Limit cheese to 1 oz (28 g) per day. Eat more home-cooked food and less restaurant, buffet, and fast food. Limit fried foods. Cook foods using methods other than frying. Limit canned vegetables. If you do use them, rinse them well to decrease the sodium. When eating at a restaurant, ask that your food be prepared with less salt, or no salt if  possible.  WHAT FOODS CAN I EAT? Seek help from a dietitian for individual calorie needs.  Grains Whole grain or whole wheat bread. Brown rice. Whole grain or whole wheat pasta. Quinoa, bulgur, and whole grain cereals. Low-sodium cereals. Corn or whole wheat flour tortillas. Whole grain cornbread. Whole grain crackers. Low-sodium crackers.  Vegetables Fresh or frozen vegetables (raw, steamed, roasted, or grilled). Low-sodium or reduced-sodium tomato and vegetable juices. Low-sodium or reduced-sodium tomato sauce and paste. Low-sodium or reduced-sodium canned vegetables.   Fruits All fresh, canned (in natural juice), or frozen fruits.  Meat and Other Protein Products Ground beef (85% or leaner), grass-fed beef, or beef trimmed of fat. Skinless chicken or [place]. Ground chicken or [place]. Pork trimmed of fat. All fish and seafood. Eggs. Dried beans, peas, or lentils. Unsalted nuts and seeds. Unsalted canned beans.  Dairy Low-fat dairy products, such as skim or 1% milk, 2% or reduced-fat cheeses, low-fat ricotta or cottage cheese, or plain low-fat yogurt. Low-sodium or reduced-sodium cheeses.  Fats and Oils Tub margarines without trans fats. Light or reduced-fat mayonnaise and salad dressings (reduced sodium). Avocado. Safflower, olive, or canola oils. Natural peanut or almond butter.  Other Unsalted popcorn and pretzels. The items listed above may not be a complete list of recommended foods or beverages. Contact your dietitian for more options.  WHAT FOODS ARE NOT RECOMMENDED?  Grains White bread. White pasta. White rice. Refined cornbread. Bagels and croissants. Crackers that contain trans fat.  Vegetables Creamed or fried vegetables. Vegetables in a cheese sauce. Regular canned vegetables. Regular canned tomato sauce and  paste. Regular tomato and vegetable juices.  Fruits Dried fruits. Canned fruit in light or heavy syrup. Fruit juice.  Meat and Other Protein Products Fatty  cuts of meat. Ribs, chicken wings, bacon, sausage, bologna, salami, chitterlings, fatback, hot dogs, bratwurst, and packaged luncheon meats. Salted nuts and seeds. Canned beans with salt.  Dairy Whole or 2% milk, cream, half-and-half, and cream cheese. Whole-fat or sweetened yogurt. Full-fat cheeses or blue cheese. Nondairy creamers and whipped toppings. Processed cheese, cheese spreads, or cheese curds.  Condiments Onion and garlic salt, seasoned salt, table salt, and sea salt. Canned and packaged gravies. Worcestershire sauce. Tartar sauce. Barbecue sauce. Teriyaki sauce. Soy sauce, including reduced sodium. Steak sauce. Fish sauce. Oyster sauce. Cocktail sauce. Horseradish. Ketchup and mustard. Meat flavorings and tenderizers. Bouillon cubes. Hot sauce. Tabasco sauce. Marinades. Taco seasonings. Relishes.  Fats and Oils Butter, stick margarine, lard, shortening, ghee, and bacon fat. Coconut, palm kernel, or palm oils. Regular salad dressings.  Other Pickles and olives. Salted popcorn and pretzels.  The items listed above may not be a complete list of foods and beverages to avoid. Contact your dietitian for more information.  WHERE CAN I FIND MORE INFORMATION? National Heart, Lung, and Blood Institute: [employer] Document Released: 01/31/2011 Document Revised: 06/28/2013 Document Reviewed: 12/16/2012 [facility] Patient Information 2015 [place], [place]. This information is not intended to replace advice given to you by your health care provider. Make sure you discuss any questions you have with your health care provider.   I think that you would greatly benefit from seeing a nutritionist.  If you are interested, please call Dr. Gerilyn Pilgrim at [phone] to schedule an appointment.

## 2022-09-04 ENCOUNTER — Other Ambulatory Visit: Payer: Self-pay | Admitting: Family Medicine

## 2022-09-04 DIAGNOSIS — M65332 Trigger finger, left middle finger: Secondary | ICD-10-CM

## 2022-09-04 LAB — CMP14+EGFR
ALT: 18 IU/L (ref 0–32)
AST: 14 IU/L (ref 0–40)
Albumin: 3.7 g/dL — ABNORMAL LOW (ref 3.9–4.9)
Alkaline Phosphatase: 62 IU/L (ref 44–121)
BUN/Creatinine Ratio: 10 (ref 9–23)
BUN: 8 mg/dL (ref 6–24)
Bilirubin Total: 0.2 mg/dL (ref 0.0–1.2)
CO2: 21 mmol/L (ref 20–29)
Calcium: 8.6 mg/dL — ABNORMAL LOW (ref 8.7–10.2)
Chloride: 104 mmol/L (ref 96–106)
Creatinine, Ser: 0.8 mg/dL (ref 0.57–1.00)
Globulin, Total: 3 g/dL (ref 1.5–4.5)
Glucose: 122 mg/dL — ABNORMAL HIGH (ref 70–99)
Potassium: 3.9 mmol/L (ref 3.5–5.2)
Sodium: 136 mmol/L (ref 134–144)
Total Protein: 6.7 g/dL (ref 6.0–8.5)
eGFR: 92 mL/min/{1.73_m2} (ref >59)

## 2022-09-04 LAB — CBC WITH DIFFERENTIAL/PLATELET
Basophils Absolute: 0 10*3/uL (ref 0.0–0.2)
Basos: 0 %
EOS (ABSOLUTE): 0.2 10*3/uL (ref 0.0–0.4)
Eos: 3 %
Hematocrit: 31.6 % — ABNORMAL LOW (ref 34.0–46.6)
Hemoglobin: 9.6 g/dL — ABNORMAL LOW (ref 11.1–15.9)
Immature Grans (Abs): 0 10*3/uL (ref 0.0–0.1)
Immature Granulocytes: 0 %
Lymphocytes Absolute: 2.1 10*3/uL (ref 0.7–3.1)
Lymphs: 29 %
MCH: 22.2 pg — ABNORMAL LOW (ref 26.6–33.0)
MCHC: 30.4 g/dL — ABNORMAL LOW (ref 31.5–35.7)
MCV: 73 fL — ABNORMAL LOW (ref 79–97)
Monocytes Absolute: 0.6 10*3/uL (ref 0.1–0.9)
Monocytes: 8 %
Neutrophils Absolute: 4.3 10*3/uL (ref 1.4–7.0)
Neutrophils: 60 %
Platelets: 220 10*3/uL (ref 150–450)
RBC: 4.33 x10E6/uL (ref 3.77–5.28)
RDW: 18.1 % — ABNORMAL HIGH (ref 11.7–15.4)
WBC: 7.2 10*3/uL (ref 3.4–10.8)

## 2022-09-04 LAB — LIPID PANEL
Chol/HDL Ratio: 4.4 ratio (ref 0.0–4.4)
Cholesterol, Total: 175 mg/dL (ref 100–199)
HDL: 40 mg/dL (ref >39)
LDL Chol Calc (NIH): 113 mg/dL — ABNORMAL HIGH (ref 0–99)
Triglycerides: 123 mg/dL (ref 0–149)
VLDL Cholesterol Cal: 22 mg/dL (ref 5–40)

## 2022-09-05 ENCOUNTER — Other Ambulatory Visit (HOSPITAL_COMMUNITY): Payer: Self-pay

## 2022-10-03 ENCOUNTER — Telehealth: Payer: Self-pay

## 2022-10-03 NOTE — Transitions of Care (Post Inpatient/ED Visit) (Signed)
   10/03/2022  Name: Vanessa [name] MRN: [id number] DOB: May 27, 1975  Today's TOC FU Call Status: Today's TOC FU Call Status:: Unsuccessful Call (1st Attempt) Unsuccessful Call (1st Attempt) Date: 10/03/22  Attempted to reach the patient regarding the most recent Inpatient/ED visit.  Follow Up Plan: Additional outreach attempts will be made to reach the patient to complete the Transitions of Care (Post Inpatient/ED visit) call.   Signature Karena Addison, LPN [facility] Nurse Health Advisor Direct Dial [phone]

## 2022-10-07 NOTE — Transitions of Care (Post Inpatient/ED Visit) (Signed)
10/07/2022  Name: Vanessa [name] MRN: [id number] DOB: 1975-10-07  Today's TOC FU Call Status: Today's TOC FU Call Status:: Successful TOC FU Call Completed Unsuccessful Call (1st Attempt) Date: 10/03/22 [facility] FU Call Complete Date: 10/07/22  Transition Care Management Follow-up Telephone Call Date of Discharge: 10/02/22 Discharge Facility: Other ([facility]) Name of Other ([facility]) Discharge Facility: [facility] Type of Discharge: Emergency Department Reason for ED Visit: Other: (headache) How have you been since you were released from the hospital?: Better  Items Reviewed: Did you receive and understand the discharge instructions provided?: No Medications obtained,verified, and reconciled?: Yes (Medications Reviewed) Any new allergies since your discharge?: No Dietary orders reviewed?: Yes Do you have support at home?: No  Medications Reviewed Today: Medications Reviewed Today     Reviewed by Karena Addison, LPN (Licensed Practical Nurse) on 10/07/22 at 1622  Med List Status: <None>   Medication Order Taking? Sig Documenting Provider Last Dose Status Informant  acetaZOLAMIDE (DIAMOX) 125 MG tablet [id number] No Take 2 tablets (250 mg total) by mouth 2 (two) times daily. Anson Fret, MD Taking Active   amitriptyline (ELAVIL) 50 MG tablet [id number]  Take 1 tablet (50 mg total) by mouth daily. Sonny Masters, FNP  Active   atorvastatin (LIPITOR) 80 MG tablet [id number]  Take 1 tablet (80 mg total) by mouth daily. Sonny Masters, FNP  Active   Blood Glucose Monitoring Suppl DEVI [id number] No 1 each by Does not apply route in the morning, at noon, and at bedtime. May substitute to any manufacturer covered by patient's insurance. Sonny Masters, FNP Taking Active   botulinum toxin Type A (BOTOX) 200 units injection [id number] No Inject 200 Units into the muscle every 3 (three) months. To be administer by provider and dispose any remainder. Lomax, Amy, NP Taking Active    Continuous Glucose Sensor (FREESTYLE LIBRE 3 SENSOR) [place] [id number]  Place 1 sensor on the skin every 14 days. Use to check glucose continuously Sonny Masters, FNP  Active   cyclobenzaprine (FLEXERIL) 10 MG tablet [id number] No TAKE 1 TABLET BY MOUTH THREE TIMES A DAY AS NEEDED FOR MUSCLE SPASMS Gwenlyn Fudge, FNP Taking Active   dicyclomine (BENTYL) 20 MG tablet [id number] No Take 20 mg by mouth 2 (two) times daily. [provider] Taking Active   empagliflozin (JARDIANCE) 25 MG TABS tablet [id number] No Take 1 tablet (25 mg total) by mouth daily before breakfast. Rakes, Doralee Albino, FNP Taking Active   EPINEPHrine (EPIPEN 2-PAK) 0.3 mg/0.3 mL IJ SOAJ injection [id number] No Inject 0.3 mg into the muscle as needed for anaphylaxis. Gwenlyn Fudge, FNP Taking Active   ferrous sulfate (FE TABS) 325 (65 FE) MG EC tablet [id number] No Take 1 tablet (325 mg total) by mouth daily with breakfast. Daryll Drown, NP Taking Active   flunisolide (NASALIDE) 25 MCG/ACT (0.025%) SOLN [id number] No Instill 2 sprays into each nostril twice daily. Gwenlyn Fudge, FNP Taking Active   Fremanezumab-vfrm (AJOVY) 225 MG/1.5ML Konrad Penta [id number] No Inject 1.5 mLs into the skin every 30 (thirty) days. Lomax, Amy, NP Taking Active   furosemide (LASIX) 40 MG tablet [id number] No Take 1 tablet (40 mg total) by mouth daily. Sonny Masters, FNP Taking Active   glucose blood [facility] ULTRA) test strip [id number] No DX:E11.65 Sonny Masters, FNP Taking Active   Lancets [facility] Epimenio Foot Dexter) MISC [id number] No Check BS 3 times a day Dx E11.65 Sonny Masters, FNP Taking Active   lisinopril (  ZESTRIL) 40 MG tablet [id number] No TAKE 1 TABLET BY MOUTH EVERY DAY Rakes, Doralee Albino, FNP Taking Active   loratadine (CLARITIN) 10 MG tablet [id number] No Take 1 tablet by mouth daily. Gwenlyn Fudge, FNP Taking Active   metoprolol succinate (TOPROL-XL) 25 MG 24 hr tablet [id number]  Take 1 tablet (25 mg total) by mouth daily. Sonny Masters, FNP  Active   montelukast (SINGULAIR) 10 MG tablet [id number] No Take 1 tablet by mouth daily at bedtime. Sonny Masters, FNP Taking Active   Multiple Vitamin (MULTIVITAMIN) tablet [id number] No Take 1 tablet by mouth daily. [provider] Taking Active   Oxycodone HCl 10 MG TABS [id number] No Take 1 tablet (10 mg total) by mouth 2 (two) times daily as needed. Gwenlyn Fudge, FNP Taking Active   Oxycodone HCl 10 MG TABS [id number] No Take 1 tablet (10 mg total) by mouth 2 (two) times daily as needed. Gwenlyn Fudge, FNP Taking Active   Oxycodone HCl 10 MG TABS [id number] No Take 1 tablet (10 mg total) by mouth 2 (two) times daily as needed. Gwenlyn Fudge, FNP Taking Active   Rimegepant Sulfate (NURTEC) 75 MG TBDP [id number] No Take 75 mg by mouth daily as needed. Onset migraine (max one daily) Lomax, Amy, NP Taking Active   Semaglutide, 2 MG/DOSE, (OZEMPIC, 2 MG/DOSE,) 8 MG/3ML SOPN [id number]  INJECT 2 MGS SUBCUTANEOUSLY AS DIRECTED ONCE A WEEK Rakes, Doralee Albino, FNP  Active   spironolactone (ALDACTONE) 100 MG tablet [id number] No Take 1 tablet (100 mg total) by mouth 2 (two) times daily. Sonny Masters, FNP Taking Active   traZODone (DESYREL) 50 MG tablet [id number] No TAKE 1 TO 2 TABLETS BY MOUTH AT BEDTIME AS NEEDED SLEEP Rakes, Doralee Albino, FNP Taking Active             Home Care and Equipment/Supplies: Were Home Health Services Ordered?: NA Any new equipment or medical supplies ordered?: NA  Functional Questionnaire: Do you need assistance with bathing/showering or dressing?: No Do you need assistance with meal preparation?: No Do you need assistance with eating?: No Do you have difficulty maintaining continence: No Do you need assistance with getting out of bed/getting out of a chair/moving?: No Do you have difficulty managing or taking your medications?: No  Follow up appointments reviewed: PCP Follow-up appointment confirmed?: NA Specialist Hospital Follow-up  appointment confirmed?: No Reason Specialist Follow-Up Not Confirmed: Patient has Specialist Provider Number and will Call for Appointment Do you need transportation to your follow-up appointment?: No Do you understand care options if your condition(s) worsen?: Yes-patient verbalized understanding    SIGNATURE Karena Addison, LPN [facility] Nurse Health Advisor Direct Dial [phone]

## 2022-10-12 ENCOUNTER — Other Ambulatory Visit: Payer: Self-pay | Admitting: Family Medicine

## 2022-10-12 DIAGNOSIS — E1159 Type 2 diabetes mellitus with other circulatory complications: Secondary | ICD-10-CM

## 2022-10-16 ENCOUNTER — Other Ambulatory Visit: Payer: Self-pay | Admitting: Family Medicine

## 2022-10-16 DIAGNOSIS — J302 Other seasonal allergic rhinitis: Secondary | ICD-10-CM

## 2022-10-22 ENCOUNTER — Telehealth: Payer: Self-pay | Admitting: Family Medicine

## 2022-10-22 DIAGNOSIS — E1165 Type 2 diabetes mellitus with hyperglycemia: Secondary | ICD-10-CM

## 2022-10-22 NOTE — Telephone Encounter (Signed)
  Name from pharmacy: FREESTYLE LIBRE 3 SENSOR   Pharmacy comment: Alternative Requested:NEEDS PRIOR AUTHORIZATION.

## 2022-10-23 ENCOUNTER — Telehealth: Payer: Self-pay

## 2022-10-23 ENCOUNTER — Other Ambulatory Visit (HOSPITAL_COMMUNITY): Payer: Self-pay

## 2022-10-23 NOTE — Telephone Encounter (Signed)
PA request has been Submitted. New Encounter created for follow up. For additional info see Pharmacy Prior Auth telephone encounter from 10/23/22.

## 2022-10-23 NOTE — Telephone Encounter (Signed)
Pharmacy Patient Advocate Encounter   Received notification from Pt Calls Messages that prior authorization for FreeStyle Libre 3 Sensor is required/requested.   Insurance verification completed.   The patient is insured through [facility] .   Per test claim: PA required; PA submitted to [facility] via CoverMyMeds Key/confirmation #/EOC [id number] Status is pending

## 2022-10-29 NOTE — Telephone Encounter (Signed)
Pharmacy Patient Advocate Encounter  Received notification from [facility] that Prior Authorization for FreeStyle Libre 3 Sensor has been DENIED.  Full denial letter will be uploaded to the media tab. See denial reason below.   PA #/Case ID/Reference #: [id number]   DENIAL REASON:   Continuous glucose monitor system (receiver, transmitter, and sensor) is denied for not meeting the prior authorization requirement(s). Product authorization requires the following: (1) Submission of medical records (for example: chart notes, laboratory values) or claims history documenting one of the following: (A) You are being treated with insulin. (B) You have a history of problematic hypoglycemia with documentation of recurrent level 2 hypoglycemic events [glucose less than 54mg /dl (3.0 mmol/L)] that persist despite multiple attempts to adjust medication(s) and/or modify the diabetes treatment plan. (C) You have a history of problematic hypoglycemia with documentation of a history of a level 3 hypoglycemic event [glucose less than 54mg /dl (3.0 mmol/L)] characterized by altered mental and/or physical state requiring third-party assistance for treatment of hypoglycemia. Reviewed by: Melody HaverPh. **Please note: This review applies to [employer], Dexcom G7, Freestyle [place], [address], [address], and [place].

## 2022-10-29 NOTE — Telephone Encounter (Signed)
Please inform pt

## 2022-10-30 NOTE — Telephone Encounter (Signed)
lmtcb

## 2022-11-08 ENCOUNTER — Other Ambulatory Visit: Payer: Self-pay | Admitting: Neurology

## 2022-11-08 DIAGNOSIS — G932 Benign intracranial hypertension: Secondary | ICD-10-CM

## 2022-11-11 NOTE — Telephone Encounter (Signed)
Patient aware and verbalizes understanding.

## 2022-12-01 ENCOUNTER — Other Ambulatory Visit: Payer: Self-pay | Admitting: Family Medicine

## 2022-12-01 DIAGNOSIS — F5101 Primary insomnia: Secondary | ICD-10-CM

## 2022-12-05 ENCOUNTER — Other Ambulatory Visit: Payer: Self-pay | Admitting: Family Medicine

## 2022-12-05 ENCOUNTER — Ambulatory Visit: Payer: 59 | Admitting: Family Medicine

## 2022-12-05 DIAGNOSIS — E1159 Type 2 diabetes mellitus with other circulatory complications: Secondary | ICD-10-CM

## 2022-12-10 ENCOUNTER — Ambulatory Visit: Payer: 59 | Admitting: Family Medicine

## 2023-01-05 IMAGING — MG MM DIGITAL SCREENING BILAT W/ TOMO AND CAD
6 of 12 series · 6 of 36 positions shown · non-contrast
Comparison: None.

CLINICAL DATA: Screening.

EXAM:
DIGITAL SCREENING BILATERAL MAMMOGRAM WITH TOMOSYNTHESIS AND CAD
TECHNIQUE: Bilateral screening digital craniocaudal and mediolateral oblique
mammograms were obtained. Bilateral screening digital breast
tomosynthesis was performed. The images were evaluated with
computer-aided detection.

[L MLO synth-2D]
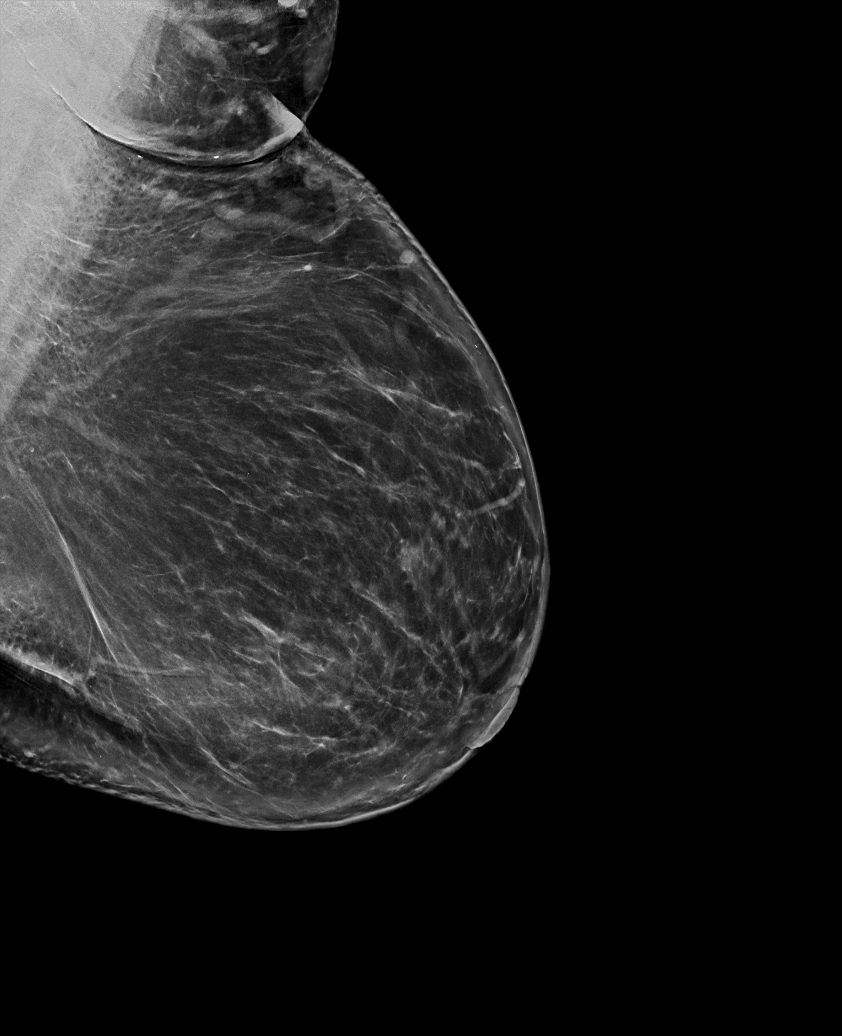

[R MLO synth-2D]
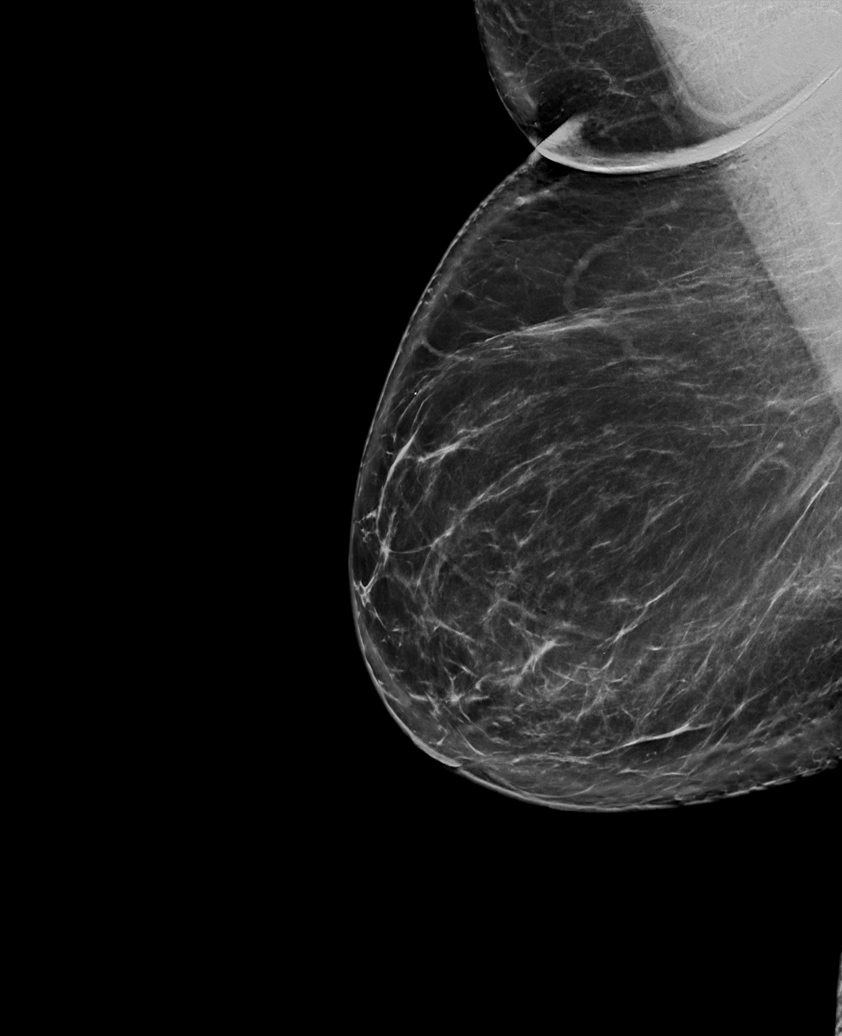

[L XCCL synth-2D]
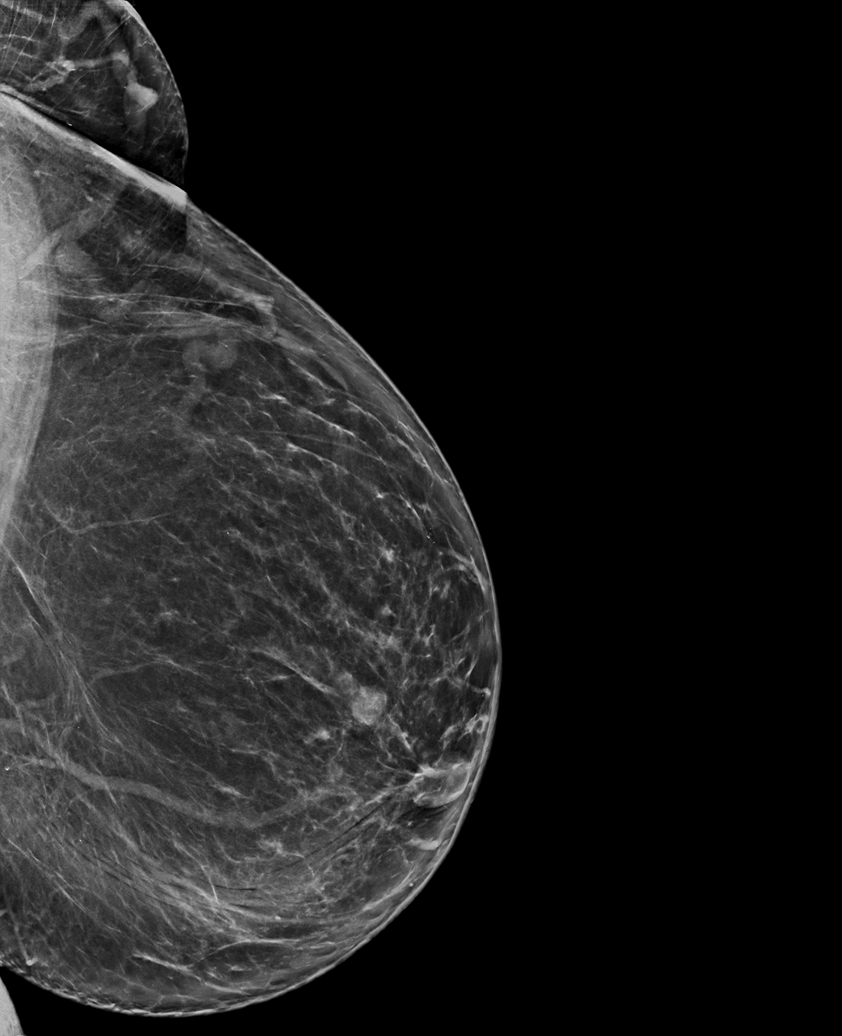

[R XCCL synth-2D]
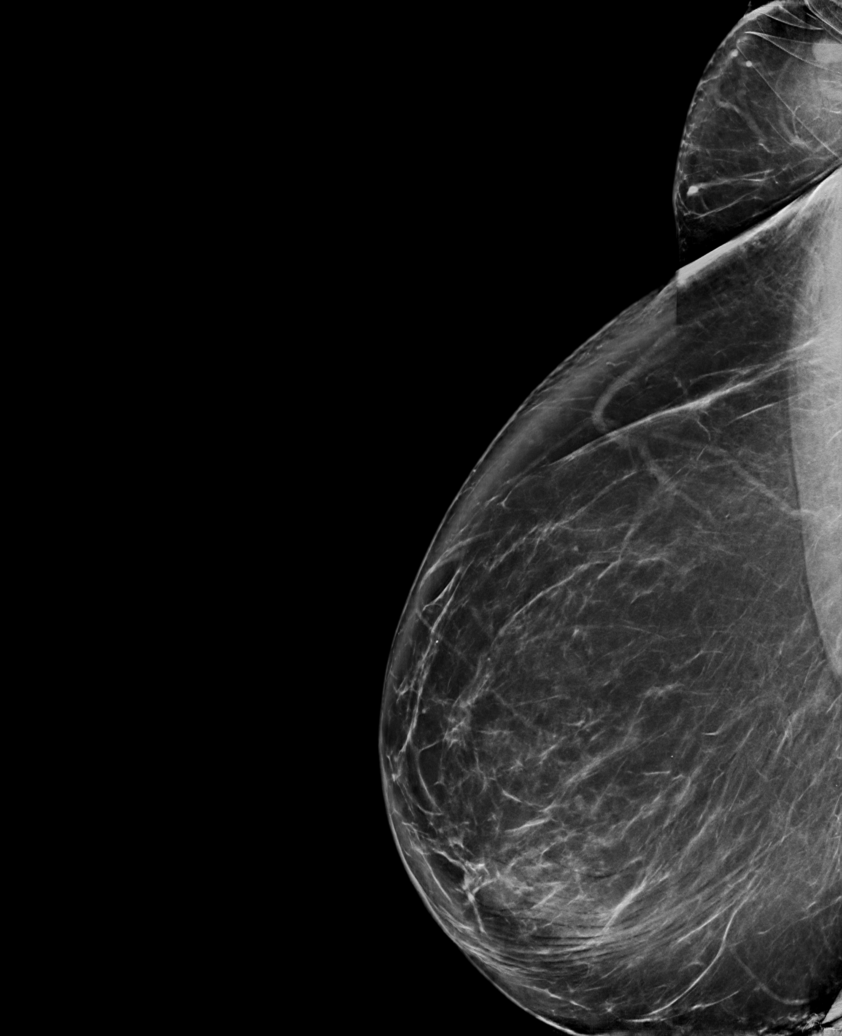

[R CC synth-2D]
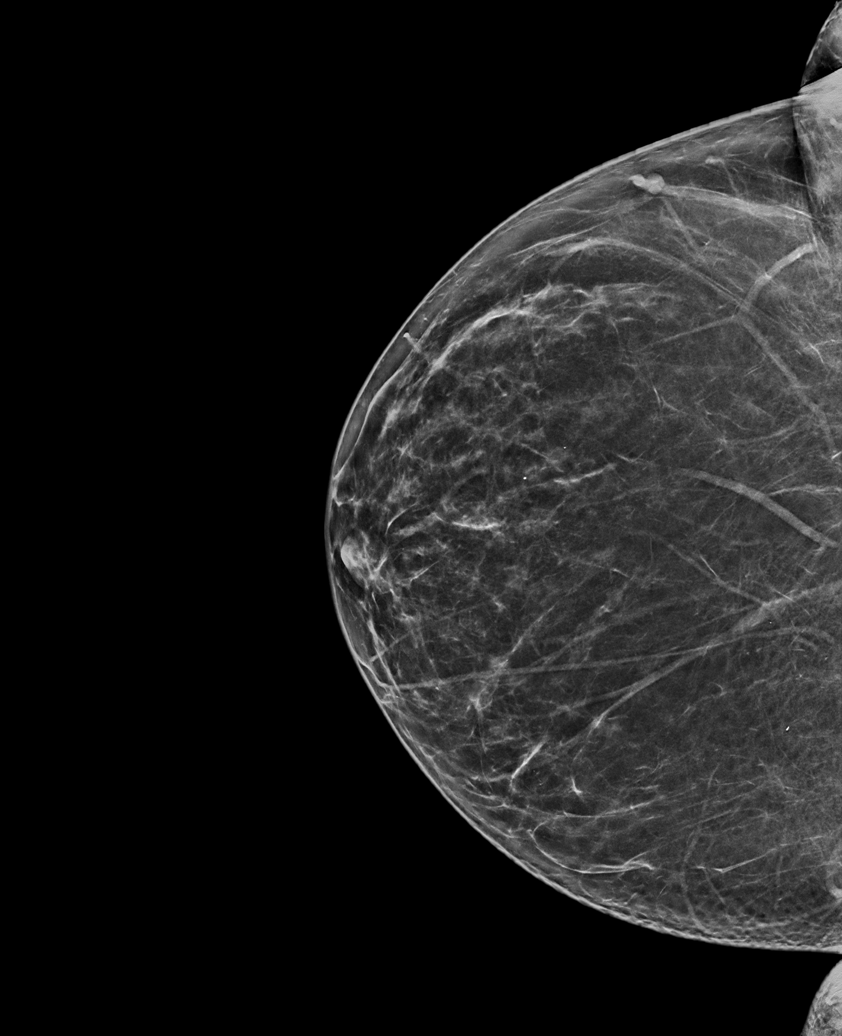

[L CC synth-2D]
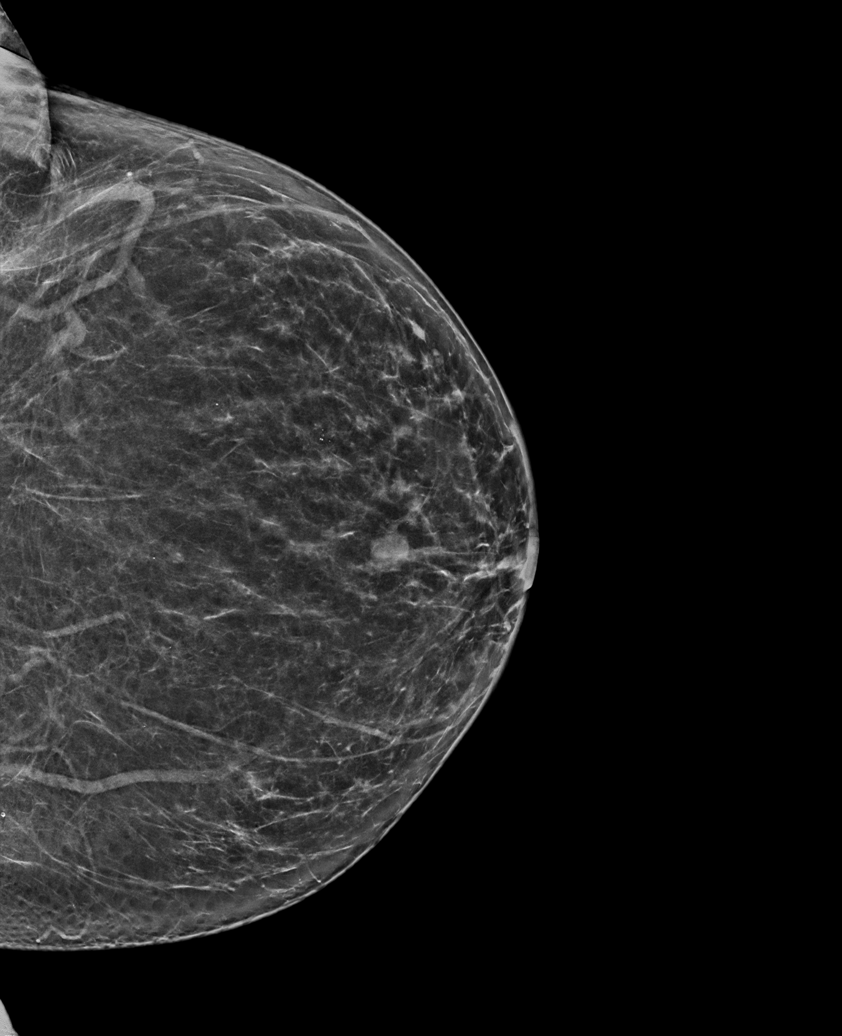

[6 of 36 positions shown; findings below may reference images not displayed]

ACR Breast Density Category b: There are scattered areas of
fibroglandular density.
FINDINGS: In the left breast, a possible mass warrants further evaluation. In
the right breast, no findings suspicious for malignancy.
IMPRESSION: Further evaluation is suggested for possible mass in the left
breast.

RECOMMENDATION:
Ultrasound of the left breast. (Code:[id number])

The patient will be contacted regarding the findings, and additional
imaging will be scheduled.

BI-RADS CATEGORY  0: Incomplete. Need additional imaging evaluation
and/or prior mammograms for comparison.

## 2023-01-07 ENCOUNTER — Other Ambulatory Visit: Payer: Self-pay | Admitting: Family Medicine

## 2023-01-07 DIAGNOSIS — E1165 Type 2 diabetes mellitus with hyperglycemia: Secondary | ICD-10-CM

## 2023-01-15 NOTE — Progress Notes (Deleted)
No chief complaint on file.   HISTORY OF PRESENT ILLNESS:  01/15/23 ALL:  Vanessa [name] returns for follow up for headaches. She was last seen 03/2022 for Botox and was doing well. She reports not being able to reschedule due to cost.   01/21/2022 ALL: Vanessa [name] returns for follow up for headaches. She is s/p rt lumboperitoneal shunt insertion 12/25/2021. Headaches were worse following surgery and shunt setting increased to H20. Headaches are significantly better. She had right upper ext pain and swelling. [place] positive for DVT. She is now on Xarelto.   She continues Botox, Ajovy and acetazolamide 250mg  BID and Nurtec as needed. Migraines are well managed. She may have 3-4 per month. Nurtec works well.   07/09/2021 ALL: Vanessa [name] returns for follow up following initiation of Botox for migraines. She continues Ajovy and Nurtec. Last procedure 06/25/2021. She feels that migraines are much better. She has about 4-5 migraine days a month, previously reporting daily headaches with 8-12 migraines. Nurtec works well for abortive therapy.   She reports more pressure headaches over the past few months. She feels that her head is going to explode at times. She reports blurred vision at times (with and without headache). No vision loss or TVO. She reports eye exam last month showed worsening optic nerve edema. She has follow up in 10/2021. BP has been better, usually between 135-145/80's. She is current on furosemide 40mg  daily and spironolactone 100mg  BID for peripheral edema. Also taking metoprolol daily.   She was diagnosed with alpha gal. She has eliminated milk and red meats from her diet. She is eating mostly chicken, [place] and fish. She reports losing 10lbs. She has never worked with medical weight management. She lives in [place], [place]. A1C improved from 10.2 to 8.2 over past 6 months.   11/30/2021 ALL:  Vanessa [name] returns for follow up for migraines. She was switched from [facility] to Ajovy at last visit  04/2020. Amitriptyline continued per PCP and Nurtec for abortive therapy. She feels that she may be doing a little better on Ajovy. Nurtec works well for abortive therapy. No vision changes or vision loss, some blurred vision with headache. She is scheduled for eye exam next week. She reports that BP has been elevated at home (145/80-90). She relates this to being nervous about a recent biopsy with GYN. She found out yesterday everything was fine with biopsy. She has a hard time sleeping, since childhood. She is on zaleplon. She feels that she just lies awake at night. Usually only gets 4 hours of sleep each night. She reports CBGs are stable. Last A1C 7.0. She feels mood is good on fluoxetine.   From a thorough review of notes patient's been on multiple medications that can be used in iih and migraine management including acetazolamide (made her feels really bad), topiramate (made headaches worse), propranolol (can't remember but reports side effects), amitriptyline (on now), amlodipine, Benadryl, gabapentin, lisinopril, Zofran, promethazine, naratriptan, maxalt, sumatriptan  05/25/2020 ALL:  Vanessa [name] is a 47 y.o. female here today for follow up for headache follow up. 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 MRI 11/2019 was unremarkable. She was started on Emgality (Ajovy not approved).  Diamox and amitriptyline continued (prescribed by PCP). She reports that around 11/2019 she stopped Diamox due to reports of nausea. She was doing very well from 11/2019 until 04/2020. Over the past month she has noticed worsening headaches. She is having  near daily dull, achy headaches in the frontal region. She is having 1-2 migraines a week. She reports smelling diesel fuel before during and after headache. She has nausea and blurred vision with headache. No vision loss in left eye. Blind in right eye. Nurtec helps most with abortive therapy.  A1C at  goal. BP has been elevated. She feels that this is due to headaches. She is taking Oxycodone 10mg  PRN (1-2 times daily) for knee pain. She has constipation. She reports neuro opthalmology exam was normal with no concerns of optic nerve edema about 2 months ago.   She was seen 3/25 in the ER for classic migraine aborted with migraine cocktail and Fioricet. CT was normal, stable position of shunt and decompressed ventricles.    HISTORY (copied from Dr Trevor Mace previous note)  HPI:  Vanessa [name] is a 47 y.o. female here as requested by Gwenlyn Fudge, FNP for migraines.  Patient has a past medical history of IDIOPATHIC INTRACRANIAL HYPERTENSION,  congenital blindness OD, migraines, morbid obesity, hypertension, vision loss, PCOS, diabetes, hyperlipidemia, congenital blindness of right eye, OD glaucoma. 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  She has been evaluated in the past by Dr. Annette Stable) as well as Dr. Jorja Loa Martin(neuro-Ophthalmology) and now she sees a neuro- ophthalmologist elsewhere.   Last time she saw neurosurgery was in 2019 and at that time the shunt was in good working condition. Headaches improved. She is still getting migraines however. Some days it feels like head is going  to explode. She has not lost weight. She has daily headaches and migraines 2x a week. She wakes up with headaches, not using her cpap. Positional worsening headache, vision changes and blurry vision.The migraines are throbbing, she may smell finny things prior to migraine. Migraines can last 3 days, severe, at least 15 migraine days a month for over a year, light/sound sensitivity, nausea, no vomiting. Daily headaches. Congenital blindness right eye. Headaches are worse in the morning and with position. No weakness, numbness. No other focal neurologic deficits, associated symptoms, inciting events or modifiable factors.  Last seen by neurosurgery in April 2019.   Reviewed notes, labs and imaging from outside physicians, which showed:   Reviewed imaging from January 2019 CT of the head, interval placement of a left frontal approach VP shunt catheter, slitlike appearance of the lateral and third ventricles.  Evidence of a prior right frontal burr hole site.  XR shunt series showed left VP shunt catheter tubing coursing along the left hemithorax and into the abdomen and terminates into the left lower quadrant, no shunt kinking or discontinuity.    From a thorough review of notes patient's been on multiple medications that can be used in iih and migraine management including acetazolamide, propranolol, amitriptyline, amlodipine, Benadryl, gabapentin, lisinopril, Zofran, promethazine,topamax, naratriptan, maxalt, sumatriptan    REVIEW OF SYSTEMS: Out  of a complete 14 system review of symptoms, the patient complains only of the following symptoms, headaches, chronic knee pain, blurred vision, and all other reviewed systems are negative.   ALLERGIES: Allergies  Allergen Reactions   Iodinated Contrast Media Swelling, Cough, Other (See Comments), Itching, Photosensitivity, Shortness Of Breath and Anaphylaxis    Migraine instantly  update, Migraine instantly  update    update update update  update  Migraine instantly    update Migraine instantly update Migraine instantly   Metrizamide Other (See Comments), Itching, Photosensitivity, Shortness Of Breath and Swelling    update Migraine instantly   Mushroom Extract Complex Itching    Throat itching with cough   Peanut Oil Anaphylaxis and Other (See Comments)    update   Peanut-Containing Drug Products Itching, Anaphylaxis and Other (See Comments)    Itching throat with cough  update   Diflucan In Dextrose [Fluconazole In Dextrose] Hives   Iodine-131 Other (See Comments)    update     HOME MEDICATIONS: Outpatient Medications Prior to Visit  Medication Sig Dispense Refill   acetaZOLAMIDE (DIAMOX) 125 MG tablet TAKE 2 TABLETS (250 MG TOTAL) BY MOUTH 2 (TWO) TIMES DAILY. 180 tablet 1   amitriptyline (ELAVIL) 50 MG tablet Take 1 tablet (50 mg total) by mouth daily. 90 tablet 1   atorvastatin (LIPITOR) 80 MG tablet Take 1 tablet (80 mg total) by mouth daily. 90 tablet 1   Blood Glucose Monitoring Suppl DEVI 1 each by Does not apply route in the morning, at noon, and at bedtime. May substitute to any manufacturer covered by patient's insurance. 1 each 0   botulinum toxin Type A (BOTOX) 200 units injection Inject 200 Units into the muscle every 3 (three) months. To be administer by provider and dispose any remainder. 1 each 2   Continuous Glucose Sensor (FREESTYLE LIBRE 3 SENSOR) MISC Place 1 sensor on the skin every 14 days. Use to check glucose continuously 2 each 10   cyclobenzaprine (FLEXERIL) 10 MG tablet TAKE 1 TABLET BY MOUTH THREE TIMES A DAY AS NEEDED FOR MUSCLE SPASMS 60 tablet 2   dicyclomine (BENTYL) 20 MG tablet Take 20 mg by mouth 2 (two) times daily.     empagliflozin (JARDIANCE) 25 MG TABS tablet Take 1 tablet (25 mg total) by mouth daily before breakfast. **NEEDS TO BE SEEN BEFORE NEXT REFILL** 30 tablet 0   EPINEPHrine (EPIPEN 2-PAK) 0.3 mg/0.3 mL IJ SOAJ injection Inject 0.3 mg into the muscle as needed for anaphylaxis.  2 each 1   ferrous sulfate (FE TABS) 325 (65 FE) MG EC tablet Take 1 tablet (325 mg total) by mouth daily with breakfast. 60 tablet 1   flunisolide (NASALIDE) 25 MCG/ACT (0.025%) SOLN Instill 2 sprays into each nostril twice daily. 25 mL 0   Fremanezumab-vfrm (AJOVY) 225 MG/1.5ML SOSY Inject 1.5 mLs into the skin every 30 (thirty) days. 4.5 mL 3   furosemide (LASIX) 40 MG tablet TAKE 1 TABLET BY MOUTH EVERY DAY 90 tablet 0   glucose blood (ONETOUCH ULTRA) test strip DX:E11.65 100 strip 0   Lancets (ONETOUCH DELICA PLUS LANCET33G) MISC Check BS 3 times a day Dx E11.65 300 each 3   lisinopril (ZESTRIL) 40 MG tablet TAKE 1 TABLET BY MOUTH EVERY DAY 90 tablet 0   loratadine (CLARITIN) 10 MG tablet Take 1 tablet by mouth daily. 90 tablet 1   metoprolol succinate (TOPROL-XL) 25 MG 24 hr tablet Take 1 tablet (25 mg total) by mouth daily. 90 tablet  1   montelukast (SINGULAIR) 10 MG tablet Take 1 tablet by mouth daily at bedtime. 90 tablet 1   Multiple Vitamin (MULTIVITAMIN) tablet Take 1 tablet by mouth daily.     Oxycodone HCl 10 MG TABS Take 1 tablet (10 mg total) by mouth 2 (two) times daily as needed. 60 tablet 0   Oxycodone HCl 10 MG TABS Take 1 tablet (10 mg total) by mouth 2 (two) times daily as needed. 60 tablet 0   Oxycodone HCl 10 MG TABS Take 1 tablet (10 mg total) by mouth 2 (two) times daily as needed. 60 tablet 0   Rimegepant Sulfate (NURTEC) 75 MG TBDP Take 75 mg by mouth daily as needed. Onset migraine (max one daily) 10 tablet 11   Semaglutide, 2 MG/DOSE, (OZEMPIC, 2 MG/DOSE,) 8 MG/3ML SOPN INJECT 2 MGS SUBCUTANEOUSLY AS DIRECTED ONCE A WEEK 9 mL 1   spironolactone (ALDACTONE) 100 MG tablet TAKE 1 TABLET BY MOUTH TWICE A DAY 180 tablet 0   traZODone (DESYREL) 50 MG tablet TAKE 1 TO 2 TABLETS BY MOUTH AT BEDTIME AS NEEDED SLEEP 180 tablet 0   No facility-administered medications prior to visit.     PAST MEDICAL HISTORY: Past Medical History:  Diagnosis Date   Allergy    Allergy to  alpha-gal 05/21/2021   Hypertension    Migraines    Pseudotumor (inflammatory) of orbit    Sepsis (HCC) 02/2019   Sleep apnea    Vaginal Pap smear, abnormal    Vision loss      PAST SURGICAL HISTORY: Past Surgical History:  Procedure Laterality Date   arm surgery Left 08/20/2022   BRAIN SURGERY     CSF SHUNT       FAMILY HISTORY: Family History  Problem Relation Age of Onset   Pulmonary Hypertension Mother    Congestive Heart Failure Mother    Hypertension Sister    Obesity Sister    Diabetes Maternal Grandmother    Migraines Neg Hx      SOCIAL HISTORY: Social History   Socioeconomic History   Marital status: Single    Spouse name: Not on file   Number of children: 0   Years of education: Not on file   Highest education level: Some college, no degree  Occupational History   Occupation: disability  Tobacco Use   Smoking status: Never   Smokeless tobacco: Never  Vaping Use   Vaping status: Never Used  Substance and Sexual Activity   Alcohol use: No   Drug use: No   Sexual activity: Not Currently    Birth control/protection: Condom  Other Topics Concern   Not on file  Social History Narrative   Lives with parents   Right handed   Caffeine: maybe 2 cups/day   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   Social Determinants of Health   Financial Resource Strain: Low Risk  (07/04/2022)   Received from [facility], [facility]   Overall Financial Resource Strain (CARDIA)    Difficulty of Paying Living Expenses: Not hard at all  Food Insecurity: Food Insecurity Present (07/04/2022)   Received from [facility], [facility]   Hunger Vital Sign    Worried About Running Out of Food in the Last Year: Never true    Ran Out of Food in the Last Year: Sometimes true  Transportation Needs: Unknown (07/04/2022)   Received from [facility], [facility]   [facility] - Transportation    Lack of Transportation (Medical): Patient declined  Lack of Transportation  (Non-Medical): No  Recent Concern: Transportation Needs - Unmet Transportation Needs (05/27/2022)   PRAPARE - Transportation    Lack of Transportation (Medical): Yes    Lack of Transportation (Non-Medical): Yes  Physical Activity: Insufficiently Active (05/27/2022)   Exercise Vital Sign    Days of Exercise per Week: 2 days    Minutes of Exercise per Session: 10 min  Stress: No Stress Concern Present (08/20/2022)   Received from [facility], [facility] - Occupational Stress Questionnaire    Feeling of Stress : Only a little  Social Connections: Unknown (08/24/2022)   Received from [facility], [facility]   Social Network    Social Network: Not on file  Recent Concern: Social Connections - Moderately Isolated (05/27/2022)   Social Connection and Isolation Panel [NHANES]    Frequency of Communication with Friends and Family: Twice a week    Frequency of Social Gatherings with Friends and Family: More than three times a week    Attends Religious Services: 1 to 4 times per year    Active Member of [employer] or Organizations: No    Attends Banker Meetings: Never    Marital Status: Never married  Intimate Partner Violence: Not At Risk (07/04/2022)   Received from [employer], [facility]   HITS    Over the last 12 months how often did your partner physically hurt you?: Never    Over the last 12 months how often did your partner insult you or talk down to you?: Never    Over the last 12 months how often did your partner threaten you with physical harm?: Never    Over the last 12 months how often did your partner scream or curse at you?: Never      PHYSICAL EXAM  There were no vitals filed for this visit.     There is no height or weight on file to calculate BMI.   Generalized: Well developed, in no acute distress  Cardiology: normal rate and rhythm, no murmur auscultated  Respiratory: clear to auscultation bilaterally     Neurological examination  Mentation: Alert oriented to time, place, history taking. Follows all commands speech and language fluent Cranial nerve II-XII: Pupils were equal round reactive to light. Extraocular movements were full of left eye, visual field were full on confrontational test. Congenital blindness of right eye, outward deviation. Facial sensation and strength were normal. Head turning and shoulder shrug  were normal and symmetric. Motor: The motor testing reveals 5 over 5 strength of all 4 extremities. Good symmetric motor tone is noted throughout.  Gait and station: Gait is normal.     DIAGNOSTIC DATA (LABS, IMAGING, TESTING) - I reviewed patient records, labs, notes, testing and imaging myself where available.  Lab Results  Component Value Date   WBC 7.2 09/03/2022   HGB 9.6 (L) 09/03/2022   HCT 31.6 (L) 09/03/2022   MCV 73 (L) 09/03/2022   PLT 220 09/03/2022      Component Value Date/Time   NA 136 09/03/2022 1257   K 3.9 09/03/2022 1257   CL 104 09/03/2022 1257   CO2 21 09/03/2022 1257   GLUCOSE 122 (H) 09/03/2022 1257   BUN 8 09/03/2022 1257   CREATININE 0.80 09/03/2022 1257   CALCIUM 8.6 (L) 09/03/2022 1257   PROT 6.7 09/03/2022 1257   ALBUMIN 3.7 (L) 09/03/2022 1257   AST 14 09/03/2022 1257   ALT 18 09/03/2022 1257  ALKPHOS 62 09/03/2022 1257   BILITOT 0.2 09/03/2022 1257   GFRNONAA 79 04/06/2020 1329   GFRAA 91 04/06/2020 1329   Lab Results  Component Value Date   CHOL 175 09/03/2022   HDL 40 09/03/2022   LDLCALC 113 (H) 09/03/2022   TRIG 123 09/03/2022   CHOLHDL 4.4 09/03/2022   Lab Results  Component Value Date   HGBA1C 5.9 (H) 09/03/2022   No results found for: "VITAMINB12" No results found for: "TSH"      No data to display               No data to display           ASSESSMENT AND PLAN  47 y.o. year old female  has a past medical history of Allergy, Allergy to alpha-gal (05/21/2021), Hypertension, Migraines,  Pseudotumor (inflammatory) of orbit, Sepsis (HCC) (02/2019), Sleep apnea, Vaginal Pap smear, abnormal, and Vision loss. here with   No diagnosis found.  Kinlie is doing much better from a migraine perspective. Previously having daily headaches with at least 8-12 migrainous days. Now reports 3-4 migraine days per month. She reports pressure headaches are improving post shunt insertion. She will continue Botox q12w, Ajovy every month and acetazolamide 250mg  BID for prevention. Amitriptyline per PCP recommendation. We will continue Nurtec for abortive therapy. Healthy lifestyle habits encouraged. She will monitor BP closely. She will follow up in 1 year for office visit, every 12 weeks for Botox. She verbalizes understanding and agreement with this plan.   No orders of the defined types were placed in this encounter.     No orders of the defined types were placed in this encounter.    I spent 30 minutes of face-to-face and non-face-to-face time with patient.  This included previsit chart review, lab review, study review, order entry, electronic health record documentation, patient education.   Shawnie Dapper, MSN, FNP-C 01/15/2023, 10:54 AM  [facility] [address] [place], [place] [address] [phone]

## 2023-01-20 ENCOUNTER — Ambulatory Visit: Payer: 59 | Admitting: Family Medicine

## 2023-01-28 ENCOUNTER — Ambulatory Visit: Payer: 59

## 2023-01-28 VITALS — Ht 70.0 in | Wt 324.0 lb

## 2023-01-28 DIAGNOSIS — Z0000 Encounter for general adult medical examination without abnormal findings: Secondary | ICD-10-CM | POA: Diagnosis not present

## 2023-01-28 DIAGNOSIS — Z1231 Encounter for screening mammogram for malignant neoplasm of breast: Secondary | ICD-10-CM | POA: Diagnosis not present

## 2023-01-28 NOTE — Progress Notes (Signed)
Subjective:   Vanessa [name] is a 47 y.o. female who presents for Medicare Annual (Subsequent) preventive examination.  Visit Complete: Virtual I connected with  Sharlene Motts on 01/28/23 by a audio enabled telemedicine application and verified that I am speaking with the correct person using two identifiers.  Patient Location: Home  Provider Location: Home Office  I discussed the limitations of evaluation and management by telemedicine. The patient expressed understanding and agreed to proceed.  Vital Signs: Because this visit was a virtual/telehealth visit, some criteria may be missing or patient reported. Any vitals not documented were not able to be obtained and vitals that have been documented are patient reported.  Cardiac Risk Factors include: diabetes mellitus;hypertension;dyslipidemia     Objective:    Today's Vitals   01/28/23 1838  Weight: (!) 324 lb (147 kg)  Height: 5\' 10"  (1.778 m)   Body mass index is 46.49 kg/m.     01/28/2023    6:52 PM 01/24/2021   10:51 AM  Advanced Directives  Does Patient Have a Medical Advance Directive? No No  Would patient like information on creating a medical advance directive? Yes (MAU/Ambulatory/Procedural Areas - Information given) No - Patient declined    Current Medications (verified) Outpatient Encounter Medications as of 01/28/2023  Medication Sig   acetaZOLAMIDE (DIAMOX) 125 MG tablet TAKE 2 TABLETS (250 MG TOTAL) BY MOUTH 2 (TWO) TIMES DAILY.   amitriptyline (ELAVIL) 50 MG tablet Take 1 tablet (50 mg total) by mouth daily.   atorvastatin (LIPITOR) 80 MG tablet Take 1 tablet (80 mg total) by mouth daily.   Blood Glucose Monitoring Suppl DEVI 1 each by Does not apply route in the morning, at noon, and at bedtime. May substitute to any manufacturer covered by patient's insurance.   botulinum toxin Type A (BOTOX) 200 units injection Inject 200 Units into the muscle every 3 (three) months. To be administer by provider and  dispose any remainder.   Continuous Glucose Sensor (FREESTYLE LIBRE 3 SENSOR) MISC Place 1 sensor on the skin every 14 days. Use to check glucose continuously   cyclobenzaprine (FLEXERIL) 10 MG tablet TAKE 1 TABLET BY MOUTH THREE TIMES A DAY AS NEEDED FOR MUSCLE SPASMS   dicyclomine (BENTYL) 20 MG tablet Take 20 mg by mouth 2 (two) times daily.   empagliflozin (JARDIANCE) 25 MG TABS tablet Take 1 tablet (25 mg total) by mouth daily before breakfast. **NEEDS TO BE SEEN BEFORE NEXT REFILL**   EPINEPHrine (EPIPEN 2-PAK) 0.3 mg/0.3 mL IJ SOAJ injection Inject 0.3 mg into the muscle as needed for anaphylaxis.   ferrous sulfate (FE TABS) 325 (65 FE) MG EC tablet Take 1 tablet (325 mg total) by mouth daily with breakfast.   flunisolide (NASALIDE) 25 MCG/ACT (0.025%) SOLN Instill 2 sprays into each nostril twice daily.   Fremanezumab-vfrm (AJOVY) 225 MG/1.5ML SOSY Inject 1.5 mLs into the skin every 30 (thirty) days.   furosemide (LASIX) 40 MG tablet TAKE 1 TABLET BY MOUTH EVERY DAY   glucose blood (ONETOUCH ULTRA) test strip DX:E11.65   Lancets (ONETOUCH DELICA PLUS LANCET33G) MISC Check BS 3 times a day Dx E11.65   lisinopril (ZESTRIL) 40 MG tablet TAKE 1 TABLET BY MOUTH EVERY DAY   loratadine (CLARITIN) 10 MG tablet Take 1 tablet by mouth daily.   metoprolol succinate (TOPROL-XL) 25 MG 24 hr tablet Take 1 tablet (25 mg total) by mouth daily.   montelukast (SINGULAIR) 10 MG tablet Take 1 tablet by mouth daily at bedtime.   Multiple  Vitamin (MULTIVITAMIN) tablet Take 1 tablet by mouth daily.   Oxycodone HCl 10 MG TABS Take 1 tablet (10 mg total) by mouth 2 (two) times daily as needed.   Oxycodone HCl 10 MG TABS Take 1 tablet (10 mg total) by mouth 2 (two) times daily as needed.   Oxycodone HCl 10 MG TABS Take 1 tablet (10 mg total) by mouth 2 (two) times daily as needed.   Rimegepant Sulfate (NURTEC) 75 MG TBDP Take 75 mg by mouth daily as needed. Onset migraine (max one daily)   Semaglutide, 2 MG/DOSE,  (OZEMPIC, 2 MG/DOSE,) 8 MG/3ML SOPN INJECT 2 MGS SUBCUTANEOUSLY AS DIRECTED ONCE A WEEK   spironolactone (ALDACTONE) 100 MG tablet TAKE 1 TABLET BY MOUTH TWICE A DAY   traZODone (DESYREL) 50 MG tablet TAKE 1 TO 2 TABLETS BY MOUTH AT BEDTIME AS NEEDED SLEEP   No facility-administered encounter medications on file as of 01/28/2023.    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   Hypertension    Migraines    Pseudotumor (inflammatory) of orbit    Sepsis (HCC) 02/2019   Sleep apnea    Vaginal Pap smear, abnormal    Vision loss    Past Surgical History:  Procedure Laterality Date   arm surgery Left 08/20/2022   BRAIN SURGERY     CSF SHUNT     Family History  Problem Relation Age of Onset   Pulmonary Hypertension Mother    Congestive Heart Failure Mother    Hypertension Sister    Obesity Sister    Diabetes Maternal Grandmother    Migraines Neg Hx    Social History   Socioeconomic History   Marital status: Single    Spouse name: Not on file   Number of children: 0   Years of education: Not on file   Highest education level: Some college, no degree  Occupational History   Occupation: disability  Tobacco Use   Smoking status: Never   Smokeless tobacco: Never  Vaping Use   Vaping status: Never Used  Substance and Sexual Activity   Alcohol use: No   Drug use: No   Sexual activity: Not Currently    Birth control/protection: Condom  Other Topics Concern   Not on file  Social History Narrative   Lives with parents   Right handed   Caffeine: maybe 2 cups/day   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   Social Determinants of Health   Financial Resource Strain: Low Risk  (01/28/2023)   Overall Financial Resource Strain (CARDIA)     Difficulty of Paying Living Expenses: Not hard at all  Food Insecurity: No Food Insecurity (01/28/2023)   Hunger Vital Sign    Worried About Running Out of Food in the Last Year: Never true    Ran Out of Food in the Last Year: Never true  Transportation Needs: Unmet Transportation Needs (01/28/2023)   PRAPARE - Transportation    Lack of Transportation (Medical): Yes    Lack of Transportation (Non-Medical): Yes  Physical Activity: Inactive (01/28/2023)   Exercise Vital Sign    Days of Exercise per Week: 0 days    Minutes of Exercise per Session: 0 min  Stress: No Stress Concern Present (01/28/2023)   [employer] of Occupational Health - Occupational Stress Questionnaire  Feeling of Stress : Only a little  Social Connections: Moderately Isolated (01/28/2023)   Social Connection and Isolation Panel [NHANES]    Frequency of Communication with Friends and Family: Twice a week    Frequency of Social Gatherings with Friends and Family: Three times a week    Attends Religious Services: 1 to 4 times per year    Active Member of Clubs or Organizations: No    Attends Banker Meetings: Never    Marital Status: Never married    Tobacco Counseling Counseling given: Not Answered   Clinical Intake:  Pre-visit preparation completed: Yes  Pain : No/denies pain     Diabetes: Yes CBG done?: No Did pt. bring in CBG monitor from home?: No  How often do you need to have someone help you when you read instructions, pamphlets, or other written materials from your doctor or pharmacy?: 1 - Never  Interpreter Needed?: No  Information entered by :: Kandis Fantasia LPN   Activities of Daily Living    01/28/2023    6:51 PM  In your present state of health, do you have any difficulty performing the following activities:  Hearing? 0  Vision? 1  Difficulty concentrating or making decisions? 0  Walking or climbing stairs? 0  Dressing or bathing? 0  Doing errands, shopping? 0   Preparing Food and eating ? N  Using the Toilet? N  In the past six months, have you accidently leaked urine? N  Do you have problems with loss of bowel control? N  Managing your Medications? N  Managing your Finances? N  Housekeeping or managing your Housekeeping? N    Patient Care Team: Sonny Masters, FNP as PCP - General (Family Medicine) [place], [facility] Nelly Rout, MD as Physician Assistant (Neurosurgery) Ellwood Sayers, MD as Referring Physician (Physical Medicine and Rehabilitation)  Indicate any recent Medical Services you may have received from other than [facility] providers in the past year (date may be approximate).     Assessment:   This is a routine wellness examination for Vanessa [name].  Hearing/Vision screen Hearing Screening - Comments:: Denies hearing difficulties   Vision Screening - Comments:: Wears rx glasses - up to date with routine eye exams with [facility]     Goals Addressed   None   Depression Screen    01/28/2023    6:48 PM 09/03/2022   12:23 PM 05/31/2022   12:00 PM 03/01/2022   12:25 PM 01/25/2022   10:18 AM 11/28/2021   12:30 PM 11/26/2021    9:38 AM  PHQ 2/9 Scores  PHQ - 2 Score 0 0 0 0 0 0 0  PHQ- 9 Score  0 0 0       Fall Risk    01/28/2023    6:51 PM 09/03/2022   12:23 PM 05/31/2022   12:00 PM 03/01/2022   12:25 PM 01/28/2022   10:03 AM  Fall Risk   Falls in the past year? 0 0 0 0 0  Number falls in past yr: 0   0 0  Injury with Fall? 0   0 0  Risk for fall due to : No Fall Risks;Impaired vision   No Fall Risks No Fall Risks  Follow up Falls prevention discussed;Education provided;Falls evaluation completed   Education provided Education provided    MEDICARE RISK AT HOME: Medicare Risk at Home Any stairs in or around the home?: No If so, are there any without handrails?: No Home  free of loose throw rugs in walkways, pet beds, electrical cords, etc?: Yes Adequate lighting in your home to reduce risk of falls?:  Yes Life alert?: No Use of a cane, walker or w/c?: No Grab bars in the bathroom?: Yes Shower chair or bench in shower?: No Elevated toilet seat or a handicapped toilet?: Yes  TIMED UP AND GO:  Was the test performed?  No    Cognitive Function:        01/28/2023    6:52 PM 01/25/2022   10:19 AM 01/24/2021   10:42 AM  6CIT Screen  What Year? 0 points 0 points 0 points  What month? 0 points 0 points 0 points  What time? 0 points 0 points 0 points  Count back from 20 0 points 0 points 0 points  Months in reverse 0 points 0 points 0 points  Repeat phrase 0 points 0 points 2 points  Total Score 0 points 0 points 2 points    Immunizations Immunization History  Administered Date(s) Administered   Influenza Inj Mdck Quad Pf 02/14/2016   Influenza,inj,Quad PF,6+ Mos 04/28/2017   Influenza,inj,quad, With Preservative 02/14/2016   Influenza-Unspecified 02/14/2016, 02/14/2016, 11/04/2019, 11/25/2020   Moderna Sars-Covid-2 Vaccination 05/21/2019, 06/25/2019, 02/14/2020, 12/27/2020   Pneumococcal Polysaccharide-23 04/18/2016   Tdap 02/25/2014    TDAP status: Up to date  Flu Vaccine status: Up to date  Pneumococcal vaccine status: Up to date  Covid-19 vaccine status: Information provided on how to obtain vaccines.   Qualifies for Shingles Vaccine? No    Screening Tests Health Maintenance  Topic Date Due   INFLUENZA VACCINE  09/26/2022   COVID-19 Vaccine (5 - 2023-24 season) 10/27/2022   MAMMOGRAM  02/13/2023   HEMOGLOBIN A1C  03/06/2023   OPHTHALMOLOGY EXAM  05/02/2023   Diabetic kidney evaluation - Urine ACR  05/31/2023   FOOT EXAM  05/31/2023   Diabetic kidney evaluation - eGFR measurement  09/03/2023   Medicare Annual Wellness (AWV)  01/28/2024   DTaP/Tdap/Td (2 - Td or Tdap) 02/26/2024   Fecal DNA (Cologuard)  02/26/2025   Cervical Cancer Screening (HPV/Pap Cotest)  10/04/2025   Hepatitis C Screening  Completed   HIV Screening  Completed   HPV VACCINES  Aged Out     Health Maintenance  Health Maintenance Due  Topic Date Due   INFLUENZA VACCINE  09/26/2022   COVID-19 Vaccine (5 - 2023-24 season) 10/27/2022    Colorectal cancer screening: Type of screening: Cologuard. Completed 02/26/22. Repeat every 3 years  Mammogram status: Ordered today. Pt provided with contact info and advised to call to schedule appt.   Lung Cancer Screening: (Low Dose CT Chest recommended if Age 43-80 years, 20 pack-year currently smoking OR have quit w/in 15years.) does not qualify.   Lung Cancer Screening Referral: n/a  Additional Screening:  Hepatitis C Screening: does qualify; Completed 01/04/21  Vision Screening: Recommended annual ophthalmology exams for early detection of glaucoma and other disorders of the eye. Is the patient up to date with their annual eye exam?  Yes  Who is the provider or what is the name of the office in which the patient attends annual eye exams? [facility] If pt is not established with a provider, would they like to be referred to a provider to establish care? No .   Dental Screening: Recommended annual dental exams for proper oral hygiene  Diabetic Foot Exam: Diabetic Foot Exam: Completed 05/31/22  Community Resource Referral / Chronic Care Management: CRR required this visit?  No   CCM required this visit?  No     Plan:     I have personally reviewed and noted the following in the patient's chart:   Medical and social history Use of alcohol, tobacco or illicit drugs  Current medications and supplements including opioid prescriptions. Patient is currently taking opioid prescriptions. Information provided to patient regarding non-opioid alternatives. Patient advised to discuss non-opioid treatment plan with their provider. Functional ability and status Nutritional status Physical activity Advanced directives List of other physicians Hospitalizations, surgeries, and ER visits in previous 12 months Vitals Screenings to  include cognitive, depression, and falls Referrals and appointments  In addition, I have reviewed and discussed with patient certain preventive protocols, quality metrics, and best practice recommendations. A written personalized care plan for preventive services as well as general preventive health recommendations were provided to patient.     Kandis Fantasia [place], [place]   11/02/1189   After Visit Summary: (MyChart) Due to this being a telephonic visit, the after visit summary with patients personalized plan was offered to patient via MyChart   Nurse Notes: No concerns at this time

## 2023-01-28 NOTE — Patient Instructions (Signed)
Ms. Anspaugh , Thank you for taking time to come for your Medicare Wellness Visit. I appreciate your ongoing commitment to your health goals. Please review the following plan we discussed and let me know if I can assist you in the future.   Referrals/Orders/Follow-Ups/Clinician Recommendations: Aim for 30 minutes of exercise or brisk walking, 6-8 glasses of water, and 5 servings of fruits and vegetables each day.  This is a list of the screening recommended for you and due dates:  Health Maintenance  Topic Date Due   Flu Shot  09/26/2022   COVID-19 Vaccine (5 - 2023-24 season) 10/27/2022   Mammogram  02/13/2023   Hemoglobin A1C  03/06/2023   Eye exam for diabetics  05/02/2023   Yearly kidney health urinalysis for diabetes  05/31/2023   Complete foot exam   05/31/2023   Yearly kidney function blood test for diabetes  09/03/2023   Medicare Annual Wellness Visit  01/28/2024   DTaP/Tdap/Td vaccine (2 - Td or Tdap) 02/26/2024   Cologuard (Stool DNA test)  02/26/2025   Pap with HPV screening  10/04/2025   Hepatitis C Screening  Completed   HIV Screening  Completed   HPV Vaccine  Aged Out    Advanced directives: (ACP Link)Information on Advanced Care Planning can be found at [facility] [place] Advance Health Care Directives Advance Health Care Directives ([email or web address]   Next Medicare Annual Wellness Visit scheduled for next year: Yes

## 2023-02-04 ENCOUNTER — Other Ambulatory Visit: Payer: Self-pay | Admitting: Family Medicine

## 2023-02-04 DIAGNOSIS — E1165 Type 2 diabetes mellitus with hyperglycemia: Secondary | ICD-10-CM

## 2023-02-04 MED ORDER — EMPAGLIFLOZIN 25 MG PO TABS: 25.0000 mg | ORAL_TABLET | Freq: Every day | ORAL | 0 refills | Status: DC

## 2023-02-04 NOTE — Addendum Note (Signed)
Addended by: Julious Payer D on: 02/04/2023 09:39 AM   Modules accepted: Orders

## 2023-02-04 NOTE — Telephone Encounter (Signed)
I made pt an appt on 03-04-2023 at 12:05pm w/[facility].

## 2023-02-04 NOTE — Telephone Encounter (Signed)
Vanessa [name] pt NTBS 30-d given 01/07/23

## 2023-02-14 ENCOUNTER — Ambulatory Visit
Admission: RE | Admit: 2023-02-14 | Discharge: 2023-02-14 | Disposition: A | Discharge status: Home / Self Care | Payer: 59 | Source: Ambulatory Visit | Attending: Family Medicine | Admitting: Family Medicine

## 2023-02-14 DIAGNOSIS — Z1231 Encounter for screening mammogram for malignant neoplasm of breast: Secondary | ICD-10-CM

## 2023-02-23 IMAGING — US US BREAST*L* LIMITED INC AXILLA
1 series · 7 of 7 positions shown · non-contrast
Comparison: Previous exam(s).

CLINICAL DATA: The patient was called back for a possible left
breast mass.

EXAM:
ULTRASOUND OF THE LEFT BREAST

[Series 1: us breast*left* limited inc axilla · 0.09mm/px · 7 of 7 slices shown]
[im 1/7]
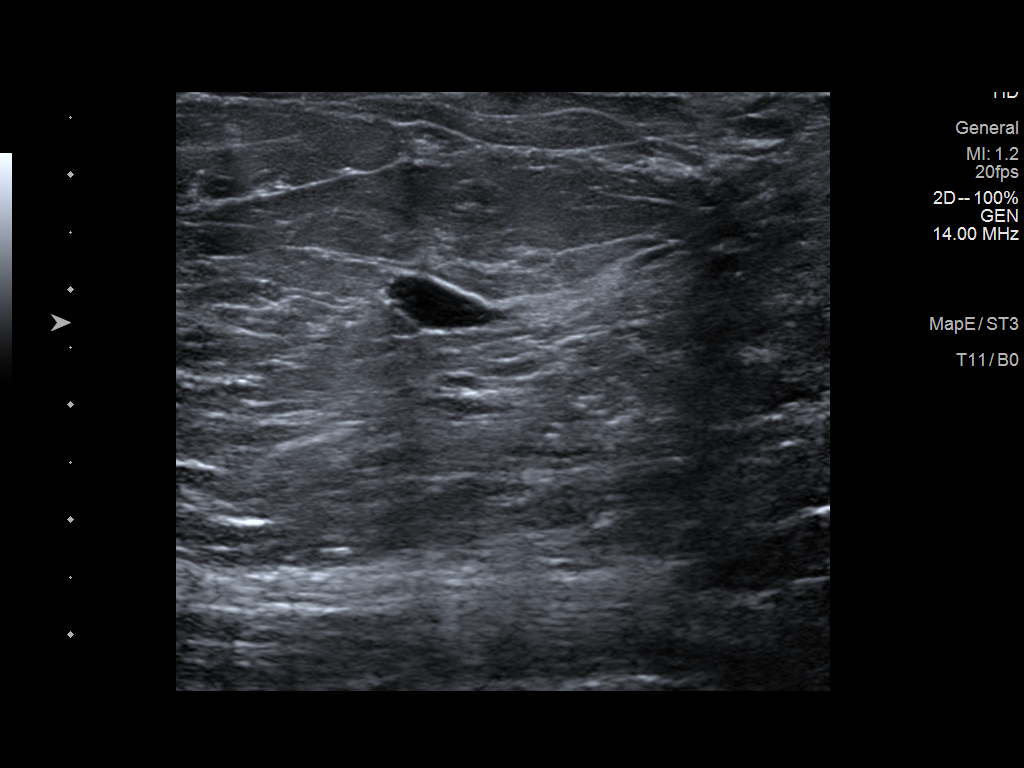
[im 2/7]
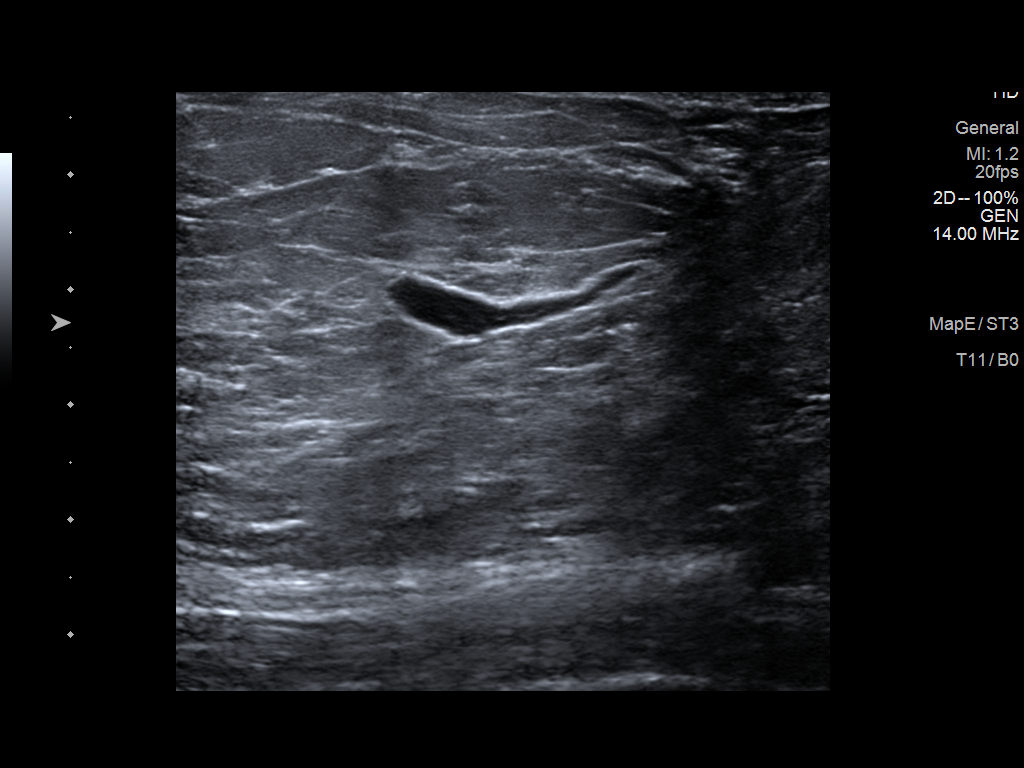
[im 3/7]
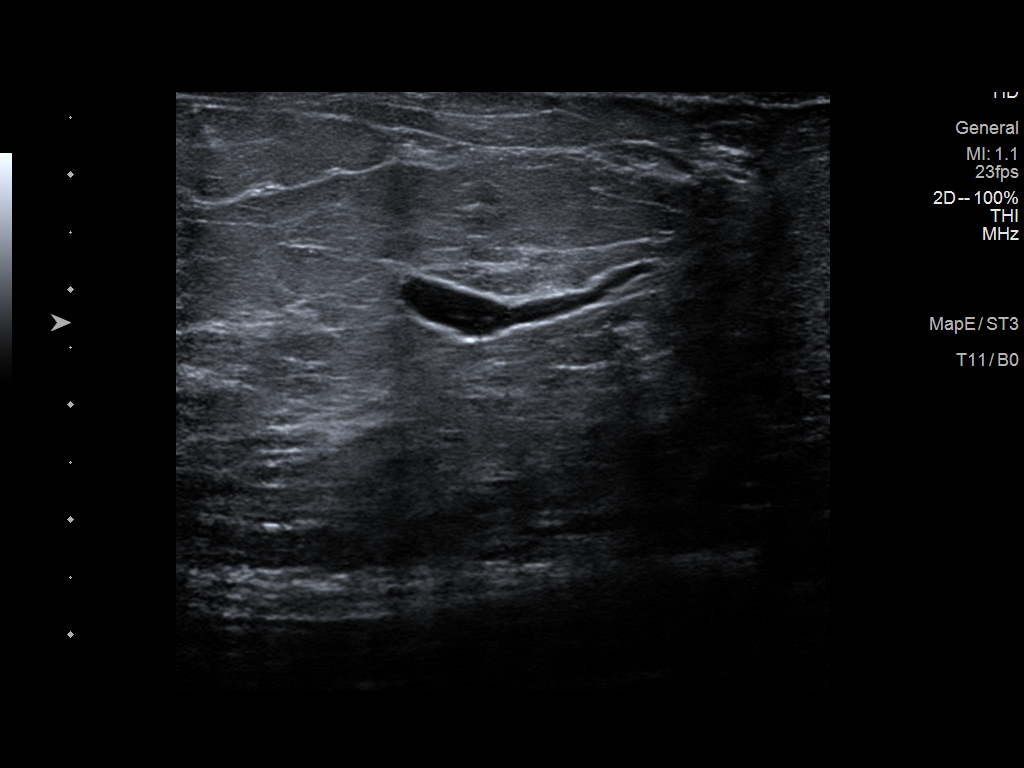
[im 4/7]
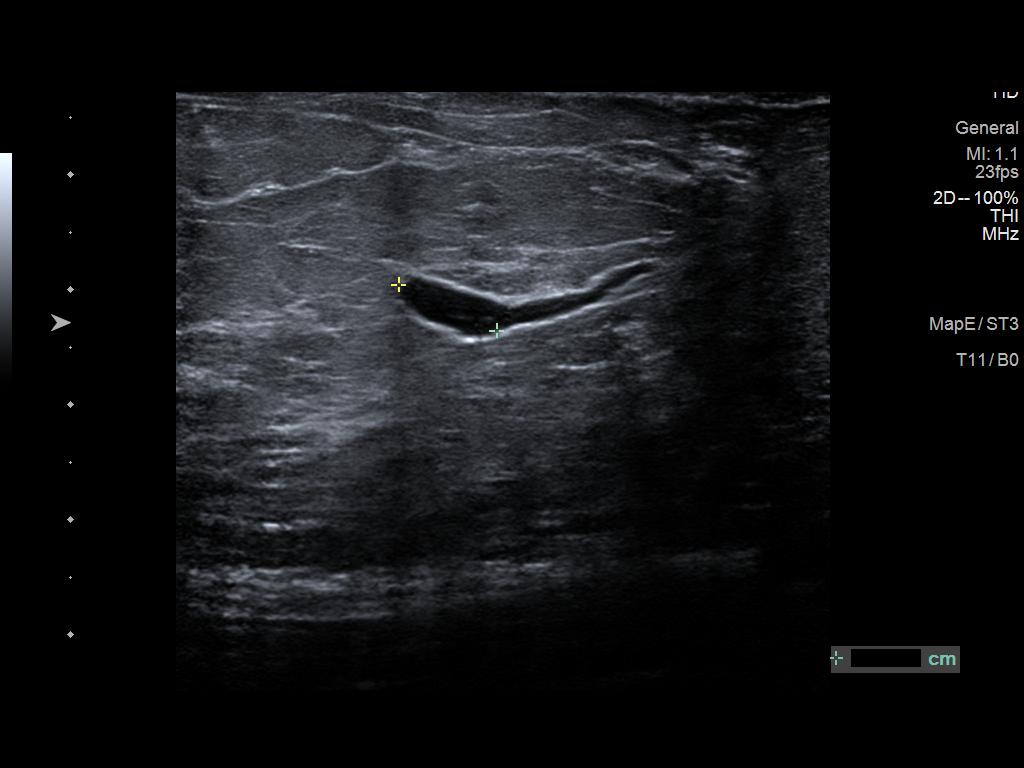
[im 5/7]
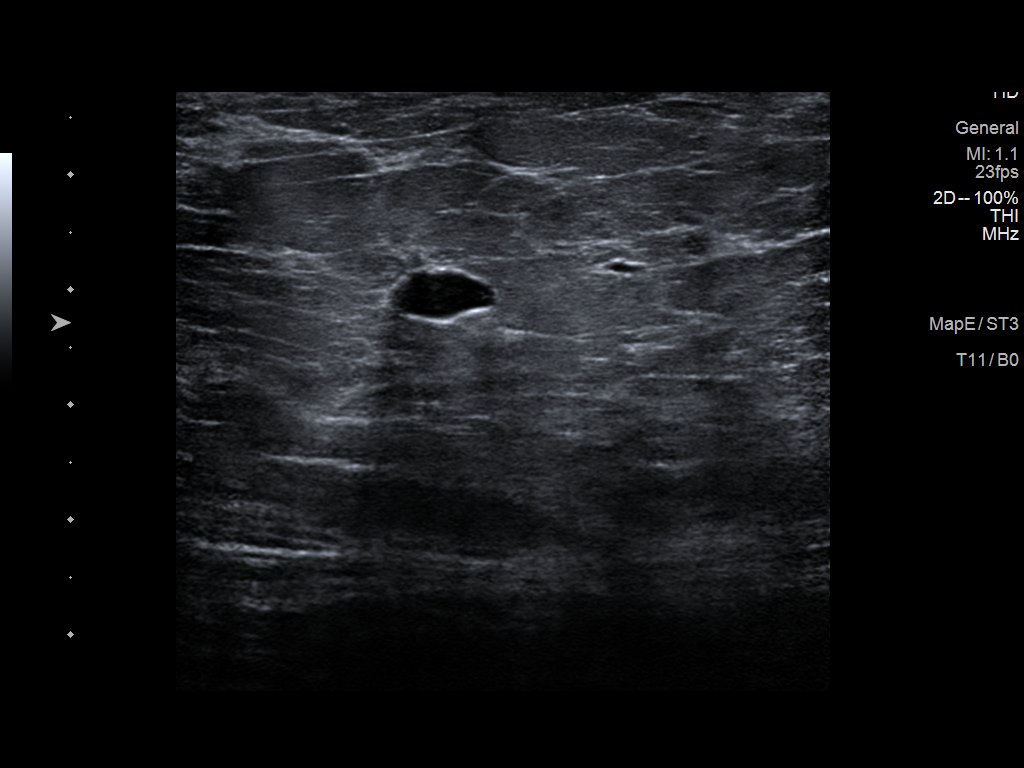
[im 6/7]
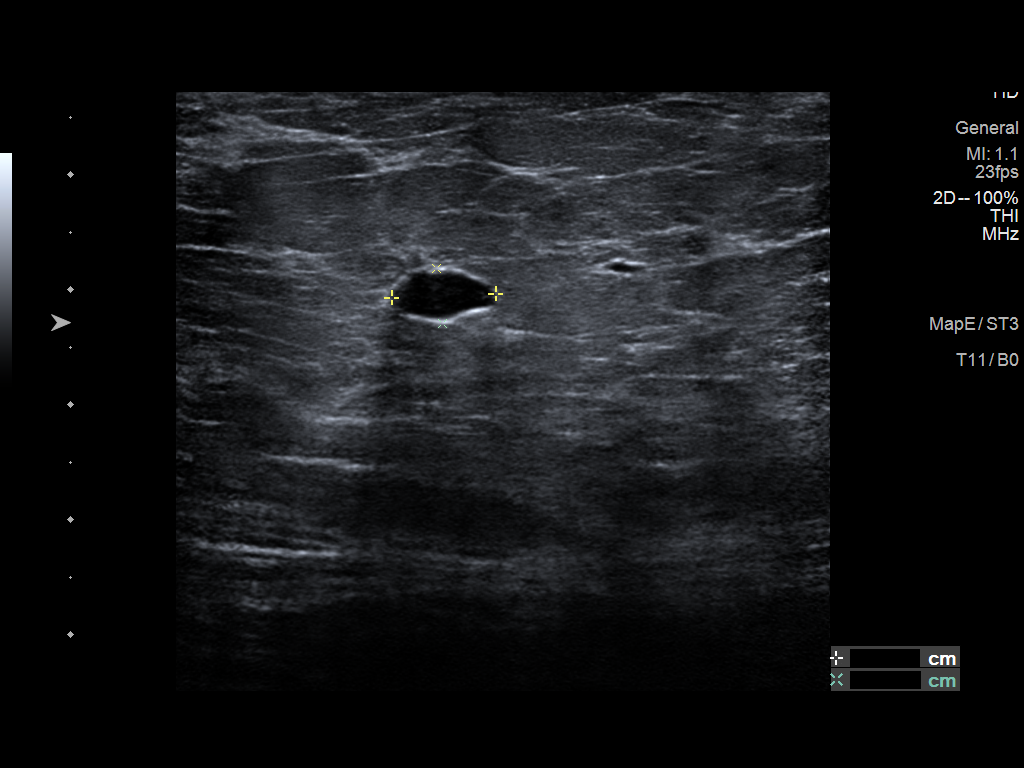
[im 7/7]
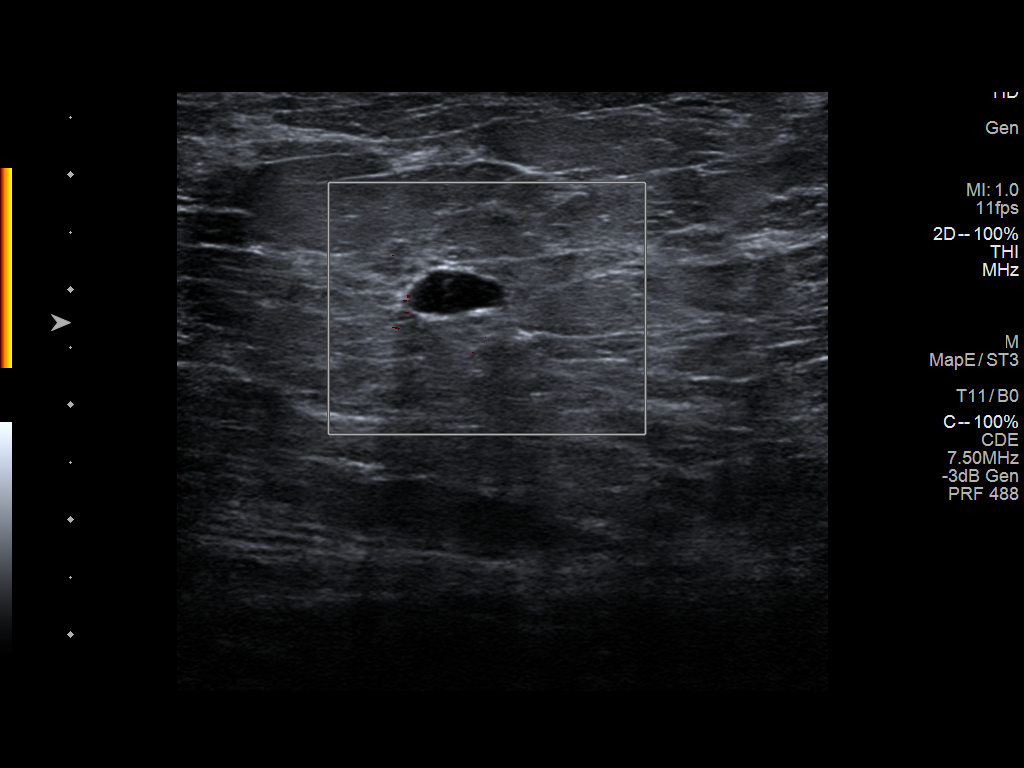

[7 of 7 positions shown; findings below may reference images not displayed]

FINDINGS: On physical exam, no suspicious lumps are identified.

Targeted ultrasound is performed, showing focal ductal ectasia with
no intraductal mass or debris. The duct proximal and distal to the
ectasias is normal in caliber. This does not qualify as a solitary
dilated duct. Rather, finding represents focal ductal ectasia.
IMPRESSION: No sonographic evidence of malignancy.

RECOMMENDATION:
Annual screening mammography.

I have discussed the findings and recommendations with the patient.
If applicable, a reminder letter will be sent to the patient
regarding the next appointment.

BI-RADS CATEGORY  2: Benign.

## 2023-02-25 ENCOUNTER — Other Ambulatory Visit: Payer: Self-pay | Admitting: Family Medicine

## 2023-02-25 DIAGNOSIS — E1165 Type 2 diabetes mellitus with hyperglycemia: Secondary | ICD-10-CM

## 2023-02-25 DIAGNOSIS — E1169 Type 2 diabetes mellitus with other specified complication: Secondary | ICD-10-CM

## 2023-03-01 ENCOUNTER — Other Ambulatory Visit: Payer: Self-pay | Admitting: Family Medicine

## 2023-03-01 DIAGNOSIS — F5101 Primary insomnia: Secondary | ICD-10-CM

## 2023-03-04 ENCOUNTER — Ambulatory Visit: Payer: 59 | Admitting: Family Medicine

## 2023-03-08 ENCOUNTER — Other Ambulatory Visit: Payer: Self-pay | Admitting: Family Medicine

## 2023-03-08 DIAGNOSIS — E1159 Type 2 diabetes mellitus with other circulatory complications: Secondary | ICD-10-CM

## 2023-03-08 DIAGNOSIS — G43709 Chronic migraine without aura, not intractable, without status migrainosus: Secondary | ICD-10-CM

## 2023-03-18 ENCOUNTER — Ambulatory Visit (INDEPENDENT_AMBULATORY_CARE_PROVIDER_SITE_OTHER): Payer: 59 | Admitting: Family Medicine

## 2023-03-18 VITALS — BP 167/79 | HR 75 | Temp 97.4°F | Ht 70.0 in | Wt 329.6 lb

## 2023-03-18 DIAGNOSIS — M170 Bilateral primary osteoarthritis of knee: Secondary | ICD-10-CM

## 2023-03-18 DIAGNOSIS — F5101 Primary insomnia: Secondary | ICD-10-CM

## 2023-03-18 DIAGNOSIS — J302 Other seasonal allergic rhinitis: Secondary | ICD-10-CM

## 2023-03-18 DIAGNOSIS — E1159 Type 2 diabetes mellitus with other circulatory complications: Secondary | ICD-10-CM

## 2023-03-18 DIAGNOSIS — Z982 Presence of cerebrospinal fluid drainage device: Secondary | ICD-10-CM

## 2023-03-18 DIAGNOSIS — I152 Hypertension secondary to endocrine disorders: Secondary | ICD-10-CM

## 2023-03-18 DIAGNOSIS — E1169 Type 2 diabetes mellitus with other specified complication: Secondary | ICD-10-CM

## 2023-03-18 DIAGNOSIS — E1165 Type 2 diabetes mellitus with hyperglycemia: Secondary | ICD-10-CM

## 2023-03-18 DIAGNOSIS — Z6841 Body Mass Index (BMI) 40.0 and over, adult: Secondary | ICD-10-CM

## 2023-03-18 DIAGNOSIS — E785 Hyperlipidemia, unspecified: Secondary | ICD-10-CM

## 2023-03-18 DIAGNOSIS — G43709 Chronic migraine without aura, not intractable, without status migrainosus: Secondary | ICD-10-CM

## 2023-03-18 DIAGNOSIS — G932 Benign intracranial hypertension: Secondary | ICD-10-CM

## 2023-03-18 LAB — LIPID PANEL

## 2023-03-18 LAB — BAYER DCA HB A1C WAIVED: HB A1C (BAYER DCA - WAIVED): 5.6 % (ref 4.8–5.6)

## 2023-03-18 MED ORDER — LISINOPRIL 40 MG PO TABS: 40.0000 mg | ORAL_TABLET | Freq: Every day | ORAL | 1 refills | Status: DC

## 2023-03-18 MED ORDER — HYDROXYZINE PAMOATE 25 MG PO CAPS: 25.0000 mg | ORAL_CAPSULE | Freq: Every evening | ORAL | 0 refills | Status: DC | PRN

## 2023-03-18 MED ORDER — MONTELUKAST SODIUM 10 MG PO TABS: ORAL_TABLET | ORAL | 1 refills | Status: DC

## 2023-03-18 NOTE — Progress Notes (Signed)
Subjective:  Patient ID: Vanessa [name], female    DOB: 1976/01/10, 48 y.o.   MRN: [id number]  Patient Care Team: Sonny Masters, FNP as PCP - General (Family Medicine) [place], [facility] Nelly Rout, MD as Physician Assistant (Neurosurgery) Ellwood Sayers, MD as Referring Physician (Physical Medicine and Rehabilitation)   Chief Complaint:  Medical Management of Chronic Issues (Chronic check up )   HPI: Vanessa [name] is a 48 y.o. female presenting on 03/18/2023 for Medical Management of Chronic Issues (Chronic check up )   Discussed the use of AI scribe software for clinical note transcription with the patient, who gave verbal consent to proceed.  History of Present Illness   The patient, with a history of diabetes, idiopathic intracranial hypertension, and chronic knee pain, reports good control of their blood sugars, ranging between 110 and 150. They have been compliant with their Jardiance and Ozempic (2mg /week) without any side effects. They deny any increased hunger, thirst, or urination. They also deny any hair, skin, or nail changes, but report itchy skin, which they attribute to the cold weather and indoor heating.  The patient has been taking their blood pressure medications as prescribed and denies any significant headaches, chest pain, or leg swelling. They report "normal" headaches, which they have been managing with neurology. However, they have not been able to receive Botox injections for their headaches for the past six months due to insurance issues.  The patient has a VP shunt, which was last checked during the procedure in October. They deny any changes in their vision since their last eye check in March of the previous year. They have been struggling with sleep, reporting that Trazodone makes them sick for two to three days after taking it. They have been taking oxycodone as needed for chronic knee pain, and have been managing any potential  constipation with stool softeners.  The patient's cholesterol medication, Atorvastatin, has not been causing any significant muscle aches or pains. They have been taking Singulair every night and have been managing their nasal symptoms with a nasal spray. Their most recent A1c was 5.6.   She does not follow an exercise routine or specific diet.          Relevant past medical, surgical, family, and social history reviewed and updated as indicated.  Allergies and medications reviewed and updated. Data reviewed: Chart in Epic.   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   Hypertension    Migraines    Pseudotumor (inflammatory) of orbit    Sepsis (HCC) 02/2019   Sleep apnea    Vaginal Pap smear, abnormal    Vision loss     Past Surgical History:  Procedure Laterality Date   arm surgery Left 08/20/2022   BRAIN SURGERY     CSF SHUNT      Social History   Socioeconomic History   Marital status: Single    Spouse name: Not on file   Number of children: 0   Years of education: Not on file   Highest education level: Some college, no degree  Occupational History   Occupation: disability  Tobacco Use   Smoking status: Never   Smokeless tobacco: Never  Vaping Use   Vaping status: Never Used  Substance and Sexual Activity   Alcohol use: No   Drug use: No   Sexual activity: Not Currently    Birth control/protection: Condom  Other Topics Concern  Not on file  Social History Narrative   Lives with parents   Right handed   Caffeine: maybe 2 cups/day   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   Social Drivers of Health   Financial Resource Strain: Medium Risk (03/18/2023)   Overall Financial Resource Strain (CARDIA)    Difficulty of Paying Living Expenses: Somewhat hard  Food Insecurity: Food Insecurity Present (03/18/2023)   Hunger Vital Sign    Worried About Running Out of Food in the Last Year: Sometimes true    Ran Out of Food in the  Last Year: Sometimes true  Transportation Needs: Unmet Transportation Needs (03/18/2023)   PRAPARE - Administrator, Civil Service (Medical): No    Lack of Transportation (Non-Medical): Yes  Physical Activity: Insufficiently Active (03/18/2023)   Exercise Vital Sign    Days of Exercise per Week: 3 days    Minutes of Exercise per Session: 10 min  Stress: Stress Concern Present (03/18/2023)   [employer] of Occupational Health - Occupational Stress Questionnaire    Feeling of Stress : To some extent  Social Connections: Moderately Integrated (03/18/2023)   Social Connection and Isolation Panel [NHANES]    Frequency of Communication with Friends and Family: More than three times a week    Frequency of Social Gatherings with Friends and Family: More than three times a week    Attends Religious Services: 1 to 4 times per year    Active Member of [employer] or Organizations: Yes    Attends Banker Meetings: 1 to 4 times per year    Marital Status: Never married  Recent Concern: Social Connections - Moderately Isolated (01/28/2023)   Social Connection and Isolation Panel [NHANES]    Frequency of Communication with Friends and Family: Twice a week    Frequency of Social Gatherings with Friends and Family: Three times a week    Attends Religious Services: 1 to 4 times per year    Active Member of Clubs or Organizations: No    Attends Banker Meetings: Never    Marital Status: Never married  Intimate Partner Violence: Not At Risk (01/28/2023)   Humiliation, Afraid, Rape, and Kick questionnaire    Fear of Current or Ex-Partner: No    Emotionally Abused: No    Physically Abused: No    Sexually Abused: No    Outpatient Encounter Medications as of 03/18/2023  Medication Sig   acetaZOLAMIDE (DIAMOX) 125 MG tablet TAKE 2 TABLETS (250 MG TOTAL) BY MOUTH 2 (TWO) TIMES DAILY.   amitriptyline (ELAVIL) 50 MG tablet TAKE 1 TABLET BY MOUTH EVERY DAY   atorvastatin  (LIPITOR) 80 MG tablet TAKE 1 TABLET BY MOUTH EVERY DAY   Blood Glucose Monitoring Suppl DEVI 1 each by Does not apply route in the morning, at noon, and at bedtime. May substitute to any manufacturer covered by patient's insurance.   botulinum toxin Type A (BOTOX) 200 units injection Inject 200 Units into the muscle every 3 (three) months. To be administer by provider and dispose any remainder.   Continuous Glucose Sensor (FREESTYLE LIBRE 3 SENSOR) MISC Place 1 sensor on the skin every 14 days. Use to check glucose continuously   cyclobenzaprine (FLEXERIL) 10 MG tablet TAKE 1 TABLET BY MOUTH THREE TIMES A DAY AS NEEDED FOR MUSCLE SPASMS   dicyclomine (BENTYL) 20 MG tablet Take 20 mg by mouth 2 (two) times daily.   empagliflozin (JARDIANCE) 25 MG TABS tablet Take 1 tablet (25 mg total) by mouth daily  before breakfast.   EPINEPHrine (EPIPEN 2-PAK) 0.3 mg/0.3 mL IJ SOAJ injection Inject 0.3 mg into the muscle as needed for anaphylaxis.   ferrous sulfate (FE TABS) 325 (65 FE) MG EC tablet Take 1 tablet (325 mg total) by mouth daily with breakfast.   flunisolide (NASALIDE) 25 MCG/ACT (0.025%) SOLN Instill 2 sprays into each nostril twice daily.   Fremanezumab-vfrm (AJOVY) 225 MG/1.5ML SOSY Inject 1.5 mLs into the skin every 30 (thirty) days.   furosemide (LASIX) 40 MG tablet TAKE 1 TABLET BY MOUTH EVERY DAY   glucose blood (ONETOUCH ULTRA) test strip DX:E11.65   hydrOXYzine (VISTARIL) 25 MG capsule Take 1-2 capsules (25-50 mg total) by mouth at bedtime and may repeat dose one time if needed.   Lancets (ONETOUCH DELICA PLUS LANCET33G) MISC Check BS 3 times a day Dx E11.65   loratadine (CLARITIN) 10 MG tablet Take 1 tablet by mouth daily.   metoprolol succinate (TOPROL-XL) 25 MG 24 hr tablet TAKE 1 TABLET (25 MG TOTAL) BY MOUTH DAILY.   Multiple Vitamin (MULTIVITAMIN) tablet Take 1 tablet by mouth daily.   Oxycodone HCl 10 MG TABS Take 1 tablet (10 mg total) by mouth 2 (two) times daily as needed.    Oxycodone HCl 10 MG TABS Take 1 tablet (10 mg total) by mouth 2 (two) times daily as needed.   Oxycodone HCl 10 MG TABS Take 1 tablet (10 mg total) by mouth 2 (two) times daily as needed.   Rimegepant Sulfate (NURTEC) 75 MG TBDP Take 75 mg by mouth daily as needed. Onset migraine (max one daily)   Semaglutide, 2 MG/DOSE, (OZEMPIC, 2 MG/DOSE,) 8 MG/3ML SOPN INJECT 2 MGS SUBCUTANEOUSLY AS DIRECTED ONCE A WEEK   spironolactone (ALDACTONE) 100 MG tablet TAKE 1 TABLET BY MOUTH TWICE A DAY   [DISCONTINUED] lisinopril (ZESTRIL) 40 MG tablet TAKE 1 TABLET BY MOUTH EVERY DAY   [DISCONTINUED] montelukast (SINGULAIR) 10 MG tablet Take 1 tablet by mouth daily at bedtime.   [DISCONTINUED] traZODone (DESYREL) 50 MG tablet TAKE 1 TO 2 TABLETS BY MOUTH AT BEDTIME AS NEEDED FOR SLEEP   lisinopril (ZESTRIL) 40 MG tablet Take 1 tablet (40 mg total) by mouth daily.   montelukast (SINGULAIR) 10 MG tablet Take 1 tablet by mouth daily at bedtime.   No facility-administered encounter medications on file as of 03/18/2023.    Allergies  Allergen Reactions   Iodinated Contrast Media Swelling, Cough, Other (See Comments), Itching, Photosensitivity, Shortness Of Breath and Anaphylaxis    Migraine instantly  update, Migraine instantly  update    update update update  update Migraine instantly    update Migraine instantly update Migraine instantly   Metrizamide Other (See Comments), Itching, Photosensitivity, Shortness Of Breath and Swelling    update Migraine instantly   Mushroom Extract Complex (Do Not Select) Itching    Throat itching with cough   Peanut Oil Anaphylaxis and Other (See Comments)    update   Peanut-Containing Drug Products Itching, Anaphylaxis and Other (See Comments)    Itching throat with cough  update   Diflucan In Dextrose [Fluconazole In Dextrose] Hives   Iodine-131 Other (See Comments)    update    Pertinent ROS per HPI, otherwise unremarkable      Objective:  BP (!) 167/79    Pulse 75   Temp (!) 97.4 F (36.3 C)   Ht 5\' 10"  (1.778 m)   Wt (!) 329 lb 9.6 oz (149.5 kg)   SpO2 100%   BMI 47.29 kg/m  Wt Readings from Last 3 Encounters:  03/18/23 (!) 329 lb 9.6 oz (149.5 kg)  01/28/23 (!) 324 lb (147 kg)  09/03/22 (!) 324 lb 12.8 oz (147.3 kg)    Physical Exam Vitals and nursing note reviewed.  Constitutional:      General: She is not in acute distress.    Appearance: Normal appearance. She is well-developed and well-groomed. She is morbidly obese. She is not ill-appearing, toxic-appearing or diaphoretic.  HENT:     Head: Normocephalic and atraumatic.     Jaw: There is normal jaw occlusion.     Comments: Exotropia of right eye, blind in right eye      Right Ear: Hearing normal.     Left Ear: Hearing normal.     Nose: Nose normal.     Mouth/Throat:     Lips: Pink.     Mouth: Mucous membranes are moist.     Pharynx: Oropharynx is clear. Uvula midline.  Eyes:     General: Lids are normal.     Extraocular Movements: Extraocular movements intact.     Conjunctiva/sclera: Conjunctivae normal.     Pupils: Pupils are equal, round, and reactive to light.  Neck:     Thyroid: No thyroid mass, thyromegaly or thyroid tenderness.     Vascular: No carotid bruit or JVD.     Trachea: Trachea and phonation normal.  Cardiovascular:     Rate and Rhythm: Normal rate and regular rhythm.     Chest Wall: PMI is not displaced.     Pulses: Normal pulses.     Heart sounds: Normal heart sounds. No murmur heard.    No friction rub. No gallop.  Pulmonary:     Effort: Pulmonary effort is normal. No respiratory distress.     Breath sounds: Normal breath sounds. No wheezing.  Abdominal:     General: Bowel sounds are normal. There is no distension or abdominal bruit.     Palpations: Abdomen is soft. There is no hepatomegaly or splenomegaly.     Tenderness: There is no abdominal tenderness. There is no right CVA tenderness or left CVA tenderness.     Hernia: No hernia is  present.  Musculoskeletal:        General: Normal range of motion.     Cervical back: Normal range of motion and neck supple.     Right lower leg: Edema present.     Left lower leg: Edema present.  Lymphadenopathy:     Cervical: No cervical adenopathy.  Skin:    General: Skin is warm and dry.     Capillary Refill: Capillary refill takes less than 2 seconds.     Coloration: Skin is not cyanotic, jaundiced or pale.     Findings: Rash present.     Comments: Venous stasis changes to bilateral lower extremities   Neurological:     General: No focal deficit present.     Mental Status: She is alert and oriented to person, place, and time.     Sensory: Sensation is intact.     Motor: Motor function is intact.     Coordination: Coordination is intact.     Gait: Gait is intact.     Deep Tendon Reflexes: Reflexes are normal and symmetric.  Psychiatric:        Attention and Perception: Attention and perception normal.        Mood and Affect: Mood and affect normal.        Speech: Speech normal.  Behavior: Behavior normal. Behavior is cooperative.        Thought Content: Thought content normal.        Cognition and Memory: Cognition and memory normal.        Judgment: Judgment normal.      Results for orders placed or performed in visit on 09/03/22  Bayer DCA Hb A1c Waived   Collection Time: 09/03/22 12:24 PM  Result Value Ref Range   HB A1C (BAYER DCA - WAIVED) 5.9 (H) 4.8 - 5.6 %  Lipid panel   Collection Time: 09/03/22 12:57 PM  Result Value Ref Range   Cholesterol, Total 175 100 - 199 mg/dL   Triglycerides 829 0 - 149 mg/dL   HDL 40 >56 mg/dL   VLDL Cholesterol Cal 22 5 - 40 mg/dL   LDL Chol Calc (NIH) 213 (H) 0 - 99 mg/dL   Chol/HDL Ratio 4.4 0.0 - 4.4 ratio  CBC with Differential/Platelet   Collection Time: 09/03/22 12:57 PM  Result Value Ref Range   WBC 7.2 3.4 - 10.8 x10E3/uL   RBC 4.33 3.77 - 5.28 x10E6/uL   Hemoglobin 9.6 (L) 11.1 - 15.9 g/dL   Hematocrit 08.6  (L) 34.0 - 46.6 %   MCV 73 (L) 79 - 97 fL   MCH 22.2 (L) 26.6 - 33.0 pg   MCHC 30.4 (L) 31.5 - 35.7 g/dL   RDW 57.8 (H) 46.9 - 62.9 %   Platelets 220 150 - 450 x10E3/uL   Neutrophils 60 Not Estab. %   Lymphs 29 Not Estab. %   Monocytes 8 Not Estab. %   Eos 3 Not Estab. %   Basos 0 Not Estab. %   Neutrophils Absolute 4.3 1.4 - 7.0 x10E3/uL   Lymphocytes Absolute 2.1 0.7 - 3.1 x10E3/uL   Monocytes Absolute 0.6 0.1 - 0.9 x10E3/uL   EOS (ABSOLUTE) 0.2 0.0 - 0.4 x10E3/uL   Basophils Absolute 0.0 0.0 - 0.2 x10E3/uL   Immature Granulocytes 0 Not Estab. %   Immature Grans (Abs) 0.0 0.0 - 0.1 x10E3/uL  CMP14+EGFR   Collection Time: 09/03/22 12:57 PM  Result Value Ref Range   Glucose 122 (H) 70 - 99 mg/dL   BUN 8 6 - 24 mg/dL   Creatinine, Ser 5.28 0.57 - 1.00 mg/dL   eGFR 92 >41 LK/GMW/1.02   BUN/Creatinine Ratio 10 9 - 23   Sodium 136 134 - 144 mmol/L   Potassium 3.9 3.5 - 5.2 mmol/L   Chloride 104 96 - 106 mmol/L   CO2 21 20 - 29 mmol/L   Calcium 8.6 (L) 8.7 - 10.2 mg/dL   Total Protein 6.7 6.0 - 8.5 g/dL   Albumin 3.7 (L) 3.9 - 4.9 g/dL   Globulin, Total 3.0 1.5 - 4.5 g/dL   Bilirubin Total 0.2 0.0 - 1.2 mg/dL   Alkaline Phosphatase 62 44 - 121 IU/L   AST 14 0 - 40 IU/L   ALT 18 0 - 32 IU/L       Pertinent labs & imaging results that were available during my care of the patient were reviewed by me and considered in my medical decision making.  Assessment & Plan:  Airielle was seen today for medical management of chronic issues.  Diagnoses and all orders for this visit:  Type 2 diabetes mellitus with hyperglycemia, without long-term current use of insulin (HCC) -     Lipid panel -     CBC with Differential/Platelet -     CMP14+EGFR -  Bayer DCA Hb A1c Waived  Hypertension associated with type 2 diabetes mellitus (HCC) -     Lipid panel -     CBC with Differential/Platelet -     CMP14+EGFR -     lisinopril (ZESTRIL) 40 MG tablet; Take 1 tablet (40 mg total) by mouth  daily.  Hyperlipidemia associated with type 2 diabetes mellitus (HCC) -     Lipid panel -     CMP14+EGFR  Morbid obesity with BMI of 45.0-49.9, adult (HCC) -     Lipid panel -     CBC with Differential/Platelet -     CMP14+EGFR -     Bayer DCA Hb A1c Waived  Chronic migraine without aura without status migrainosus, not intractable -     CBC with Differential/Platelet -     CMP14+EGFR  IIH (idiopathic intracranial hypertension) -     CBC with Differential/Platelet -     CMP14+EGFR  S/P ventriculoperitoneal shunt -     CBC with Differential/Platelet -     CMP14+EGFR  Bilateral primary osteoarthritis of knee -     CMP14+EGFR  Seasonal allergies -     montelukast (SINGULAIR) 10 MG tablet; Take 1 tablet by mouth daily at bedtime.  Primary insomnia -     hydrOXYzine (VISTARIL) 25 MG capsule; Take 1-2 capsules (25-50 mg total) by mouth at bedtime and may repeat dose one time if needed.     Assessment and Plan    Type 2 Diabetes Mellitus Blood sugars range between 110-150 mg/dL. No polyphagia, polydipsia, or polyuria. A1c is 5.6, indicating good control. Current medications: Jardiance and Ozempic 2 mg weekly. Emphasized maintaining current regimen and regular monitoring. - Continue Jardiance and Ozempic - Follow-up in 4-5 months for diabetes management  Hypertension Blood pressure is well-managed. No significant headaches, chest pain, or edema. Lisinopril needs a refill. Emphasized adherence to medication to prevent complications. - Refill lisinopril  Idiopathic Intracranial Hypertension Ongoing idiopathic intracranial hypertension and migraines. Last Botox injection six months ago; insurance issues have delayed further treatments. No recent shunt series since October 2021. Discussed potential repeat imaging if symptoms worsen and exploring insurance options for Botox. - Discuss insurance options for Botox injections - Consider repeat imaging if symptoms  worsen  Migraine Uses Ajovy for migraines. No recent visual changes. Last eye exam was in March last year. Emphasized the importance of regular eye exams to monitor for migraine-related changes. - Refill Ajovy - Schedule eye check-up  Insomnia Trazodone was ineffective and caused prolonged sickness. Discussed trying hydroxyzine (Vistaril) for sleep. Explained potential side effects and the importance of monitoring response. - Prescribe hydroxyzine 25-50 mg QHS as needed, may repeat once - Follow-up if hydroxyzine is effective  Osteoarthritis Uses oxycodone for knee pain as needed. No significant constipation due to concurrent stool softeners. Emphasized using oxycodone sparingly to avoid dependency and continuing stool softeners to prevent constipation. - Continue current oxycodone regimen as needed - Continue stool softeners to prevent constipation  Hyperlipidemia On atorvastatin with no significant myalgia. Emphasized continuing medication to manage cholesterol and prevent cardiovascular events. - Continue atorvastatin  General Health Maintenance Up to date with most medications and screenings. Singulair needs a refill. Emphasized adherence to medication regimen and regular health maintenance visits. - Refill Singulair - Ensure all other medications are up to date  Follow-up - Follow-up in 4-5 months for diabetes management.       Total time spent with patient 45 minutes.  Greater than 50% of encounter spent in coordination  of care/counseling.    Continue all other maintenance medications.  Follow up plan: Return 4-5 months, for DM.   Continue healthy lifestyle choices, including diet (rich in fruits, vegetables, and lean proteins, and low in salt and simple carbohydrates) and exercise (at least 30 minutes of moderate physical activity daily).  Educational handout given for DM  The above assessment and management plan was discussed with the patient. The patient  verbalized understanding of and has agreed to the management plan. Patient is aware to call the clinic if they develop any new symptoms or if symptoms persist or worsen. Patient is aware when to return to the clinic for a follow-up visit. Patient educated on when it is appropriate to go to the emergency department.   Kari Baars, FNP-C [facility] [phone]

## 2023-03-18 NOTE — Patient Instructions (Signed)

## 2023-03-19 ENCOUNTER — Encounter: Payer: Self-pay | Admitting: Family Medicine

## 2023-03-19 LAB — CBC WITH DIFFERENTIAL/PLATELET
Basophils Absolute: 0 10*3/uL (ref 0.0–0.2)
Basos: 0 %
EOS (ABSOLUTE): 0.1 10*3/uL (ref 0.0–0.4)
Eos: 1 %
Hematocrit: 32.8 % — ABNORMAL LOW (ref 34.0–46.6)
Hemoglobin: 9.5 g/dL — ABNORMAL LOW (ref 11.1–15.9)
Immature Grans (Abs): 0 10*3/uL (ref 0.0–0.1)
Immature Granulocytes: 0 %
Lymphocytes Absolute: 2.7 10*3/uL (ref 0.7–3.1)
Lymphs: 27 %
MCH: 21.4 pg — ABNORMAL LOW (ref 26.6–33.0)
MCHC: 29 g/dL — ABNORMAL LOW (ref 31.5–35.7)
MCV: 74 fL — ABNORMAL LOW (ref 79–97)
Monocytes Absolute: 0.5 10*3/uL (ref 0.1–0.9)
Monocytes: 5 %
Neutrophils Absolute: 6.7 10*3/uL (ref 1.4–7.0)
Neutrophils: 67 %
Platelets: 267 10*3/uL (ref 150–450)
RBC: 4.43 x10E6/uL (ref 3.77–5.28)
RDW: 17.7 % — ABNORMAL HIGH (ref 11.7–15.4)
WBC: 10 10*3/uL (ref 3.4–10.8)

## 2023-03-19 LAB — CMP14+EGFR
ALT: 20 IU/L (ref 0–32)
AST: 14 IU/L (ref 0–40)
Albumin: 4 g/dL (ref 3.9–4.9)
Alkaline Phosphatase: 61 IU/L (ref 44–121)
BUN/Creatinine Ratio: 16 (ref 9–23)
BUN: 14 mg/dL (ref 6–24)
Bilirubin Total: 0.4 mg/dL (ref 0.0–1.2)
CO2: 23 mmol/L (ref 20–29)
Calcium: 8.9 mg/dL (ref 8.7–10.2)
Chloride: 102 mmol/L (ref 96–106)
Creatinine, Ser: 0.87 mg/dL (ref 0.57–1.00)
Globulin, Total: 3 g/dL (ref 1.5–4.5)
Glucose: 119 mg/dL — ABNORMAL HIGH (ref 70–99)
Potassium: 4.2 mmol/L (ref 3.5–5.2)
Sodium: 136 mmol/L (ref 134–144)
Total Protein: 7 g/dL (ref 6.0–8.5)
eGFR: 83 mL/min/{1.73_m2} (ref >59)

## 2023-03-19 LAB — LIPID PANEL
Cholesterol, Total: 178 mg/dL (ref 100–199)
HDL: 43 mg/dL (ref >39)
LDL CALC COMMENT:: 4.1 ratio (ref 0.0–4.4)
LDL Chol Calc (NIH): 121 mg/dL — ABNORMAL HIGH (ref 0–99)
Triglycerides: 77 mg/dL (ref 0–149)
VLDL Cholesterol Cal: 14 mg/dL (ref 5–40)

## 2023-03-21 ENCOUNTER — Other Ambulatory Visit: Payer: Self-pay | Admitting: Family Medicine

## 2023-03-21 DIAGNOSIS — E1165 Type 2 diabetes mellitus with hyperglycemia: Secondary | ICD-10-CM

## 2023-04-08 ENCOUNTER — Other Ambulatory Visit: Payer: Self-pay | Admitting: Family Medicine

## 2023-04-08 DIAGNOSIS — I152 Hypertension secondary to endocrine disorders: Secondary | ICD-10-CM

## 2023-04-10 ENCOUNTER — Other Ambulatory Visit: Payer: Self-pay | Admitting: Family Medicine

## 2023-04-10 DIAGNOSIS — F5101 Primary insomnia: Secondary | ICD-10-CM

## 2023-04-14 ENCOUNTER — Other Ambulatory Visit: Payer: Self-pay | Admitting: Family Medicine

## 2023-04-14 DIAGNOSIS — J302 Other seasonal allergic rhinitis: Secondary | ICD-10-CM

## 2023-04-30 ENCOUNTER — Encounter: Payer: Self-pay | Admitting: Family Medicine

## 2023-04-30 ENCOUNTER — Ambulatory Visit (INDEPENDENT_AMBULATORY_CARE_PROVIDER_SITE_OTHER): Admitting: Family Medicine

## 2023-04-30 VITALS — BP 158/72 | HR 90 | Temp 97.7°F | Ht 70.0 in | Wt 333.2 lb

## 2023-04-30 DIAGNOSIS — J069 Acute upper respiratory infection, unspecified: Secondary | ICD-10-CM

## 2023-04-30 DIAGNOSIS — E1165 Type 2 diabetes mellitus with hyperglycemia: Secondary | ICD-10-CM

## 2023-04-30 DIAGNOSIS — Z7985 Long-term (current) use of injectable non-insulin antidiabetic drugs: Secondary | ICD-10-CM

## 2023-04-30 DIAGNOSIS — N898 Other specified noninflammatory disorders of vagina: Secondary | ICD-10-CM

## 2023-04-30 DIAGNOSIS — H66002 Acute suppurative otitis media without spontaneous rupture of ear drum, left ear: Secondary | ICD-10-CM | POA: Diagnosis not present

## 2023-04-30 DIAGNOSIS — B948 Sequelae of other specified infectious and parasitic diseases: Secondary | ICD-10-CM | POA: Diagnosis not present

## 2023-04-30 MED ORDER — PROMETHAZINE-DM 6.25-15 MG/5ML PO SYRP: 5.0000 mL | ORAL_SOLUTION | Freq: Four times a day (QID) | ORAL | 0 refills | Status: DC | PRN

## 2023-04-30 MED ORDER — AMOXICILLIN-POT CLAVULANATE 875-125 MG PO TABS: 1.0000 | ORAL_TABLET | Freq: Two times a day (BID) | ORAL | 0 refills | Status: AC

## 2023-04-30 MED ORDER — ONETOUCH ULTRA VI STRP: ORAL_STRIP | 0 refills | Status: DC

## 2023-04-30 MED ORDER — LANCET DEVICE MISC: 1.0000 | Freq: Three times a day (TID) | 0 refills | Status: AC

## 2023-04-30 MED ORDER — BLOOD GLUCOSE MONITORING SUPPL DEVI: 1.0000 | Freq: Three times a day (TID) | 0 refills | Status: DC

## 2023-04-30 MED ORDER — LANCETS MISC. MISC: 1.0000 | Freq: Three times a day (TID) | 0 refills | Status: AC

## 2023-04-30 NOTE — Progress Notes (Signed)
 Subjective:  Patient ID: Vanessa [name], female    DOB: 05/02/1975, 48 y.o.   MRN: [id number]  Patient Care Team: Sonny Masters, FNP as PCP - General (Family Medicine) [place] Nelly Rout, MD as Physician Assistant (Neurosurgery) Ellwood Sayers, MD as Referring Physician (Physical Medicine and Rehabilitation)   Chief Complaint:  Cough, Nasal Congestion, and Headache (Patient states that she had the flu 3 weeks ago and still has symptoms. )   HPI: Vanessa [name] is a 48 y.o. female presenting on 04/30/2023 for Cough, Nasal Congestion, and Headache (Patient states that she had the flu 3 weeks ago and still has symptoms. )   Discussed the use of AI scribe software for clinical note transcription with the patient, who gave verbal consent to proceed.  History of Present Illness   Vanessa [name] is a 48 year old female who presents with persistent cough and chest discomfort following a recent flu infection.  Approximately three weeks ago, she experienced the flu, which has not fully resolved, leading to a persistent cough and chest discomfort. The cough is non-productive, though occasionally she produces a small amount of sputum. It is severe enough to cause headaches and chest pain, particularly troublesome at night, causing her to sit up and cough throughout the night. No current fever, and her temperature was normal when last checked. She has been managing her symptoms with Tylenol and cortisone, but these have not provided significant relief. She has not taken Tamiflu for the flu and was not seen by a healthcare provider at the time of the initial illness. She has previously used Teflon pearls for cough management but has not found them effective this time.  She reports bilateral ear discomfort, with the left ear being more problematic. The left ear is infected, while the right ear has fluid behind it. She has a history of yeast infections with antibiotic  use.  She requests a prescription for glucose strips and possibly a new glucose monitor, as her current device is malfunctioning. Her blood sugars have been stable, and she wants to avoid insulin therapy, having been on it once before.          Relevant past medical, surgical, family, and social history reviewed and updated as indicated.  Allergies and medications reviewed and updated. Data reviewed: Chart in Epic.   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   Hypertension    Migraines    Pseudotumor (inflammatory) of orbit    Sepsis (HCC) 02/2019   Sleep apnea    Vaginal Pap smear, abnormal    Vision loss     Past Surgical History:  Procedure Laterality Date   arm surgery Left 08/20/2022   BRAIN SURGERY     CSF SHUNT      Social History   Socioeconomic History   Marital status: Single    Spouse name: Not on file   Number of children: 0   Years of education: Not on file   Highest education level: Some college, no degree  Occupational History   Occupation: disability  Tobacco Use   Smoking status: Never   Smokeless tobacco: Never  Vaping Use   Vaping status: Never Used  Substance and Sexual Activity   Alcohol use: No   Drug use: No   Sexual activity: Not Currently    Birth control/protection: Condom  Other Topics Concern   Not on file  Social History  Narrative   Lives with parents   Right handed   Caffeine: maybe 2 cups/day   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   Social Drivers of Health   Financial Resource Strain: Medium Risk (03/18/2023)   Overall Financial Resource Strain (CARDIA)    Difficulty of Paying Living Expenses: Somewhat hard  Food Insecurity: Food Insecurity Present (03/18/2023)   Hunger Vital Sign    Worried About Running Out of Food in the Last Year: Sometimes true    Ran Out of Food in the Last Year: Sometimes true  Transportation Needs: Unmet Transportation Needs (03/18/2023)   PRAPARE -  Administrator, Civil Service (Medical): No    Lack of Transportation (Non-Medical): Yes  Physical Activity: Insufficiently Active (03/18/2023)   Exercise Vital Sign    Days of Exercise per Week: 3 days    Minutes of Exercise per Session: 10 min  Stress: Stress Concern Present (03/18/2023)   [employer] of Occupational Health - Occupational Stress Questionnaire    Feeling of Stress : To some extent  Social Connections: Moderately Integrated (03/18/2023)   Social Connection and Isolation Panel [NHANES]    Frequency of Communication with Friends and Family: More than three times a week    Frequency of Social Gatherings with Friends and Family: More than three times a week    Attends Religious Services: 1 to 4 times per year    Active Member of [employer] or Organizations: Yes    Attends Banker Meetings: 1 to 4 times per year    Marital Status: Never married  Recent Concern: Social Connections - Moderately Isolated (01/28/2023)   Social Connection and Isolation Panel [NHANES]    Frequency of Communication with Friends and Family: Twice a week    Frequency of Social Gatherings with Friends and Family: Three times a week    Attends Religious Services: 1 to 4 times per year    Active Member of Clubs or Organizations: No    Attends Banker Meetings: Never    Marital Status: Never married  Intimate Partner Violence: Not At Risk (01/28/2023)   Humiliation, Afraid, Rape, and Kick questionnaire    Fear of Current or Ex-Partner: No    Emotionally Abused: No    Physically Abused: No    Sexually Abused: No    Outpatient Encounter Medications as of 04/30/2023  Medication Sig   acetaZOLAMIDE (DIAMOX) 125 MG tablet TAKE 2 TABLETS (250 MG TOTAL) BY MOUTH 2 (TWO) TIMES DAILY.   amitriptyline (ELAVIL) 50 MG tablet TAKE 1 TABLET BY MOUTH EVERY DAY   amoxicillin-clavulanate (AUGMENTIN) 875-125 MG tablet Take 1 tablet by mouth 2 (two) times daily for 10 days.    atorvastatin (LIPITOR) 80 MG tablet TAKE 1 TABLET BY MOUTH EVERY DAY   Blood Glucose Monitoring Suppl DEVI 1 each by Does not apply route in the morning, at noon, and at bedtime. May substitute to any manufacturer covered by patient's insurance.   botulinum toxin Type A (BOTOX) 200 units injection Inject 200 Units into the muscle every 3 (three) months. To be administer by provider and dispose any remainder.   Continuous Glucose Sensor (FREESTYLE LIBRE 3 SENSOR) MISC Place 1 sensor on the skin every 14 days. Use to check glucose continuously   cyclobenzaprine (FLEXERIL) 10 MG tablet TAKE 1 TABLET BY MOUTH THREE TIMES A DAY AS NEEDED FOR MUSCLE SPASMS   dicyclomine (BENTYL) 20 MG tablet Take 20 mg by mouth 2 (two) times daily.   empagliflozin (JARDIANCE)  25 MG TABS tablet Take 1 tablet (25 mg total) by mouth daily before breakfast.   EPINEPHrine (EPIPEN 2-PAK) 0.3 mg/0.3 mL IJ SOAJ injection Inject 0.3 mg into the muscle as needed for anaphylaxis.   ferrous sulfate (FE TABS) 325 (65 FE) MG EC tablet Take 1 tablet (325 mg total) by mouth daily with breakfast.   flunisolide (NASALIDE) 25 MCG/ACT (0.025%) SOLN Instill 2 sprays into each nostril twice daily.   Fremanezumab-vfrm (AJOVY) 225 MG/1.5ML SOSY Inject 1.5 mLs into the skin every 30 (thirty) days.   furosemide (LASIX) 40 MG tablet TAKE 1 TABLET BY MOUTH EVERY DAY   hydrOXYzine (VISTARIL) 25 MG capsule TAKE 1-2 CAPSULES (25-50 MG TOTAL) BY MOUTH AT BEDTIME AND MAY REPEAT DOSE ONE TIME IF NEEDED.   Lancet Device MISC 1 each by Does not apply route in the morning, at noon, and at bedtime. May substitute to any manufacturer covered by patient's insurance.   Lancets (ONETOUCH DELICA PLUS LANCET33G) MISC Check BS 3 times a day Dx E11.65   Lancets Misc. MISC 1 each by Does not apply route in the morning, at noon, and at bedtime. May substitute to any manufacturer covered by patient's insurance.   lisinopril (ZESTRIL) 40 MG tablet Take 1 tablet (40 mg  total) by mouth daily.   loratadine (CLARITIN) 10 MG tablet Take 1 tablet by mouth daily.   metoprolol succinate (TOPROL-XL) 25 MG 24 hr tablet TAKE 1 TABLET (25 MG TOTAL) BY MOUTH DAILY.   montelukast (SINGULAIR) 10 MG tablet Take 1 tablet by mouth daily at bedtime.   Multiple Vitamin (MULTIVITAMIN) tablet Take 1 tablet by mouth daily.   Oxycodone HCl 10 MG TABS Take 1 tablet (10 mg total) by mouth 2 (two) times daily as needed.   Oxycodone HCl 10 MG TABS Take 1 tablet (10 mg total) by mouth 2 (two) times daily as needed.   Oxycodone HCl 10 MG TABS Take 1 tablet (10 mg total) by mouth 2 (two) times daily as needed.   promethazine-dextromethorphan (PROMETHAZINE-DM) 6.25-15 MG/5ML syrup Take 5 mLs by mouth 4 (four) times daily as needed for cough.   Rimegepant Sulfate (NURTEC) 75 MG TBDP Take 75 mg by mouth daily as needed. Onset migraine (max one daily)   Semaglutide, 2 MG/DOSE, (OZEMPIC, 2 MG/DOSE,) 8 MG/3ML SOPN INJECT 2 MGS SUBCUTANEOUSLY AS DIRECTED ONCE A WEEK   spironolactone (ALDACTONE) 100 MG tablet TAKE 1 TABLET BY MOUTH TWICE A DAY   [DISCONTINUED] Blood Glucose Monitoring Suppl DEVI 1 each by Does not apply route in the morning, at noon, and at bedtime. May substitute to any manufacturer covered by patient's insurance.   [DISCONTINUED] glucose blood (ONETOUCH ULTRA) test strip DX:E11.65   glucose blood (ONETOUCH ULTRA) test strip DX:E11.65   No facility-administered encounter medications on file as of 04/30/2023.    Allergies  Allergen Reactions   Iodinated Contrast Media Swelling, Cough, Other (See Comments), Itching, Photosensitivity, Shortness Of Breath and Anaphylaxis    Migraine instantly  update, Migraine instantly  update    update update update  update Migraine instantly    update Migraine instantly update Migraine instantly   Metrizamide Other (See Comments), Itching, Photosensitivity, Shortness Of Breath and Swelling    update Migraine instantly   Mushroom  Extract Complex (Obsolete) Itching    Throat itching with cough   Peanut Oil Anaphylaxis and Other (See Comments)    update   Peanut-Containing Drug Products Itching, Anaphylaxis and Other (See Comments)    Itching throat with  cough  update   Diflucan In Dextrose [Fluconazole In Dextrose] Hives   Iodine-131 Other (See Comments)    update    Pertinent ROS per HPI, otherwise unremarkable      Objective:  BP (!) 158/72   Pulse 90   Temp 97.7 F (36.5 C)   Ht 5\' 10"  (1.778 m)   Wt (!) 333 lb 3.2 oz (151.1 kg)   SpO2 95%   BMI 47.81 kg/m    Wt Readings from Last 3 Encounters:  04/30/23 (!) 333 lb 3.2 oz (151.1 kg)  03/18/23 (!) 329 lb 9.6 oz (149.5 kg)  01/28/23 (!) 324 lb (147 kg)    Physical Exam Vitals and nursing note reviewed.  Constitutional:      General: She is not in acute distress.    Appearance: She is well-developed. She is morbidly obese. She is not ill-appearing, toxic-appearing or diaphoretic.  HENT:     Head: Normocephalic and atraumatic.     Right Ear: A middle ear effusion is present.     Left Ear: Tympanic membrane is erythematous and bulging.     Nose: Congestion present.     Mouth/Throat:     Lips: Pink.     Mouth: Mucous membranes are moist.     Pharynx: Posterior oropharyngeal erythema present. No pharyngeal swelling, oropharyngeal exudate, uvula swelling or postnasal drip.  Eyes:     Conjunctiva/sclera: Conjunctivae normal.  Cardiovascular:     Rate and Rhythm: Normal rate and regular rhythm.     Heart sounds: Normal heart sounds.  Pulmonary:     Effort: Pulmonary effort is normal.     Breath sounds: Normal breath sounds.  Skin:    General: Skin is warm and dry.     Capillary Refill: Capillary refill takes less than 2 seconds.  Neurological:     General: No focal deficit present.     Mental Status: She is alert and oriented to person, place, and time.  Psychiatric:        Mood and Affect: Mood normal.        Behavior: Behavior normal.  Behavior is cooperative.        Thought Content: Thought content normal.        Judgment: Judgment normal.     Results for orders placed or performed in visit on 03/18/23  Bayer DCA Hb A1c Waived   Collection Time: 03/18/23 10:45 AM  Result Value Ref Range   HB A1C (BAYER DCA - WAIVED) 5.6 4.8 - 5.6 %  Lipid panel   Collection Time: 03/18/23 11:45 AM  Result Value Ref Range   Cholesterol, Total 178 100 - 199 mg/dL   Triglycerides 77 0 - 149 mg/dL   HDL 43 >16 mg/dL   VLDL Cholesterol Cal 14 5 - 40 mg/dL   LDL Chol Calc (NIH) 109 (H) 0 - 99 mg/dL   Chol/HDL Ratio 4.1 0.0 - 4.4 ratio  CBC with Differential/Platelet   Collection Time: 03/18/23 11:45 AM  Result Value Ref Range   WBC 10.0 3.4 - 10.8 x10E3/uL   RBC 4.43 3.77 - 5.28 x10E6/uL   Hemoglobin 9.5 (L) 11.1 - 15.9 g/dL   Hematocrit 60.4 (L) 54.0 - 46.6 %   MCV 74 (L) 79 - 97 fL   MCH 21.4 (L) 26.6 - 33.0 pg   MCHC 29.0 (L) 31.5 - 35.7 g/dL   RDW 98.1 (H) 19.1 - 47.8 %   Platelets 267 150 - 450 x10E3/uL   Neutrophils 67 Not  Estab. %   Lymphs 27 Not Estab. %   Monocytes 5 Not Estab. %   Eos 1 Not Estab. %   Basos 0 Not Estab. %   Neutrophils Absolute 6.7 1.4 - 7.0 x10E3/uL   Lymphocytes Absolute 2.7 0.7 - 3.1 x10E3/uL   Monocytes Absolute 0.5 0.1 - 0.9 x10E3/uL   EOS (ABSOLUTE) 0.1 0.0 - 0.4 x10E3/uL   Basophils Absolute 0.0 0.0 - 0.2 x10E3/uL   Immature Granulocytes 0 Not Estab. %   Immature Grans (Abs) 0.0 0.0 - 0.1 x10E3/uL  CMP14+EGFR   Collection Time: 03/18/23 11:45 AM  Result Value Ref Range   Glucose 119 (H) 70 - 99 mg/dL   BUN 14 6 - 24 mg/dL   Creatinine, Ser 2.13 0.57 - 1.00 mg/dL   eGFR 83 >08 MV/HQI/6.96   BUN/Creatinine Ratio 16 9 - 23   Sodium 136 134 - 144 mmol/L   Potassium 4.2 3.5 - 5.2 mmol/L   Chloride 102 96 - 106 mmol/L   CO2 23 20 - 29 mmol/L   Calcium 8.9 8.7 - 10.2 mg/dL   Total Protein 7.0 6.0 - 8.5 g/dL   Albumin 4.0 3.9 - 4.9 g/dL   Globulin, Total 3.0 1.5 - 4.5 g/dL   Bilirubin  Total 0.4 0.0 - 1.2 mg/dL   Alkaline Phosphatase 61 44 - 121 IU/L   AST 14 0 - 40 IU/L   ALT 20 0 - 32 IU/L       Pertinent labs & imaging results that were available during my care of the patient were reviewed by me and considered in my medical decision making.  Assessment & Plan:  Vanessa [name] was seen today for cough, nasal congestion and headache.  Diagnoses and all orders for this visit:  Post-viral disorder -     amoxicillin-clavulanate (AUGMENTIN) 875-125 MG tablet; Take 1 tablet by mouth 2 (two) times daily for 10 days. -     promethazine-dextromethorphan (PROMETHAZINE-DM) 6.25-15 MG/5ML syrup; Take 5 mLs by mouth 4 (four) times daily as needed for cough.  Non-recurrent acute suppurative otitis media of left ear without spontaneous rupture of tympanic membrane -     amoxicillin-clavulanate (AUGMENTIN) 875-125 MG tablet; Take 1 tablet by mouth 2 (two) times daily for 10 days.  URI with cough and congestion -     amoxicillin-clavulanate (AUGMENTIN) 875-125 MG tablet; Take 1 tablet by mouth 2 (two) times daily for 10 days. -     promethazine-dextromethorphan (PROMETHAZINE-DM) 6.25-15 MG/5ML syrup; Take 5 mLs by mouth 4 (four) times daily as needed for cough.  Type 2 diabetes mellitus with hyperglycemia, without long-term current use of insulin (HCC) -     Blood Glucose Monitoring Suppl DEVI; 1 each by Does not apply route in the morning, at noon, and at bedtime. May substitute to any manufacturer covered by patient's insurance. -     Lancet Device MISC; 1 each by Does not apply route in the morning, at noon, and at bedtime. May substitute to any manufacturer covered by patient's insurance. -     Lancets Misc. MISC; 1 each by Does not apply route in the morning, at noon, and at bedtime. May substitute to any manufacturer covered by patient's insurance. -     glucose blood (ONETOUCH ULTRA) test strip; DX:E11.65     Assessment and Plan    Post-viral syndrome Experiencing post-viral  syndrome following an influenza infection three weeks ago, with symptoms of persistent cough, headache from coughing, and chest pain. Lungs are clear, indicating  no pneumonia. Likely due to residual inflammation and irritation from the viral infection. - Prescribe promethazine DM cough syrup, to be taken up to four times daily as needed for cough, with caution for potential drowsiness.  Left otitis media Left ear infection likely bacterial, with fluid in the right ear but no infection. Augmentin is chosen to cover both upper respiratory and ear infections. - Prescribe Augmentin, to be taken twice daily for ten days.  Antibiotic-associated yeast infection risk Risk of yeast infections with antibiotic use, complicated by fluconazole allergy. Advised to monitor for symptoms and report for alternative treatment options. - Monitor for symptoms of yeast infection and report any symptoms for alternative treatment options.  Diabetes management Requires glucose strips and a new glucose monitor due to malfunctioning device. Managing diabetes without insulin, aiming to maintain blood sugar levels to avoid insulin use. - Send prescription for glucose strips and a new glucose monitor.          Continue all other maintenance medications.  Follow up plan: Return if symptoms worsen or fail to improve.   Continue healthy lifestyle choices, including diet (rich in fruits, vegetables, and lean proteins, and low in salt and simple carbohydrates) and exercise (at least 30 minutes of moderate physical activity daily).  Educational handout given for URI  The above assessment and management plan was discussed with the patient. The patient verbalized understanding of and has agreed to the management plan. Patient is aware to call the clinic if they develop any new symptoms or if symptoms persist or worsen. Patient is aware when to return to the clinic for a follow-up visit. Patient educated on when it is  appropriate to go to the emergency department.   Kari Baars, FNP-C [facility] [phone]

## 2023-05-08 ENCOUNTER — Encounter: Payer: Self-pay | Admitting: Family Medicine

## 2023-05-09 MED ORDER — FLUCONAZOLE 150 MG PO TABS: 150.0000 mg | ORAL_TABLET | Freq: Once | ORAL | 0 refills | Status: AC

## 2023-05-09 NOTE — Addendum Note (Signed)
 Addended by: Sonny Masters on: 05/09/2023 08:00 AM   Modules accepted: Orders

## 2023-06-23 ENCOUNTER — Other Ambulatory Visit: Payer: Self-pay | Admitting: Family Medicine

## 2023-06-23 DIAGNOSIS — E1159 Type 2 diabetes mellitus with other circulatory complications: Secondary | ICD-10-CM

## 2023-08-14 ENCOUNTER — Ambulatory Visit: Payer: 59 | Admitting: Family Medicine

## 2023-09-04 ENCOUNTER — Ambulatory Visit: Admitting: Family Medicine

## 2023-09-11 ENCOUNTER — Other Ambulatory Visit: Payer: Self-pay | Admitting: Family Medicine

## 2023-09-11 DIAGNOSIS — E1165 Type 2 diabetes mellitus with hyperglycemia: Secondary | ICD-10-CM

## 2023-10-01 ENCOUNTER — Ambulatory Visit: Admitting: Family Medicine

## 2023-10-01 VITALS — BP 178/88 | HR 73 | Temp 97.5°F | Ht 70.0 in | Wt 331.8 lb

## 2023-10-01 DIAGNOSIS — E785 Hyperlipidemia, unspecified: Secondary | ICD-10-CM

## 2023-10-01 DIAGNOSIS — E1165 Type 2 diabetes mellitus with hyperglycemia: Secondary | ICD-10-CM | POA: Diagnosis not present

## 2023-10-01 DIAGNOSIS — E1169 Type 2 diabetes mellitus with other specified complication: Secondary | ICD-10-CM | POA: Diagnosis not present

## 2023-10-01 DIAGNOSIS — J302 Other seasonal allergic rhinitis: Secondary | ICD-10-CM

## 2023-10-01 DIAGNOSIS — E1159 Type 2 diabetes mellitus with other circulatory complications: Secondary | ICD-10-CM | POA: Diagnosis not present

## 2023-10-01 DIAGNOSIS — Z6841 Body Mass Index (BMI) 40.0 and over, adult: Secondary | ICD-10-CM

## 2023-10-01 DIAGNOSIS — G932 Benign intracranial hypertension: Secondary | ICD-10-CM

## 2023-10-01 DIAGNOSIS — G43709 Chronic migraine without aura, not intractable, without status migrainosus: Secondary | ICD-10-CM

## 2023-10-01 DIAGNOSIS — F5101 Primary insomnia: Secondary | ICD-10-CM | POA: Insufficient documentation

## 2023-10-01 DIAGNOSIS — I152 Hypertension secondary to endocrine disorders: Secondary | ICD-10-CM

## 2023-10-01 LAB — CBC WITH DIFFERENTIAL/PLATELET
Basophils Absolute: 0 x10E3/uL (ref 0.0–0.2)
Basos: 0 %
EOS (ABSOLUTE): 0.1 x10E3/uL (ref 0.0–0.4)
Eos: 1 %
Hematocrit: 32 % — ABNORMAL LOW (ref 34.0–46.6)
Hemoglobin: 9.7 g/dL — ABNORMAL LOW (ref 11.1–15.9)
Immature Grans (Abs): 0 x10E3/uL (ref 0.0–0.1)
Immature Granulocytes: 0 %
Lymphocytes Absolute: 1.6 x10E3/uL (ref 0.7–3.1)
Lymphs: 28 %
MCH: 22.5 pg — ABNORMAL LOW (ref 26.6–33.0)
MCHC: 30.3 g/dL — ABNORMAL LOW (ref 31.5–35.7)
MCV: 74 fL — ABNORMAL LOW (ref 79–97)
Monocytes Absolute: 0.7 x10E3/uL (ref 0.1–0.9)
Monocytes: 12 %
Neutrophils Absolute: 3.2 x10E3/uL (ref 1.4–7.0)
Neutrophils: 59 %
Platelets: 224 x10E3/uL (ref 150–450)
RBC: 4.31 x10E6/uL (ref 3.77–5.28)
RDW: 17.6 % — ABNORMAL HIGH (ref 11.7–15.4)
WBC: 5.5 x10E3/uL (ref 3.4–10.8)

## 2023-10-01 LAB — BAYER DCA HB A1C WAIVED: HB A1C (BAYER DCA - WAIVED): 5.8 % — ABNORMAL HIGH (ref 4.8–5.6)

## 2023-10-01 MED ORDER — AMITRIPTYLINE HCL 50 MG PO TABS: 50.0000 mg | ORAL_TABLET | Freq: Every day | ORAL | 1 refills | Status: DC

## 2023-10-01 MED ORDER — SPIRONOLACTONE 100 MG PO TABS: 100.0000 mg | ORAL_TABLET | Freq: Two times a day (BID) | ORAL | 1 refills | Status: DC

## 2023-10-01 MED ORDER — ATORVASTATIN CALCIUM 80 MG PO TABS: 80.0000 mg | ORAL_TABLET | Freq: Every day | ORAL | 1 refills | Status: DC

## 2023-10-01 MED ORDER — MONTELUKAST SODIUM 10 MG PO TABS: 10.0000 mg | ORAL_TABLET | Freq: Every day | ORAL | 1 refills | Status: DC

## 2023-10-01 MED ORDER — FUROSEMIDE 40 MG PO TABS: 40.0000 mg | ORAL_TABLET | Freq: Every day | ORAL | 1 refills | Status: DC

## 2023-10-01 MED ORDER — LISINOPRIL 40 MG PO TABS: 40.0000 mg | ORAL_TABLET | Freq: Every day | ORAL | 1 refills | Status: DC

## 2023-10-01 MED ORDER — ONETOUCH ULTRA VI STRP: ORAL_STRIP | 0 refills | Status: DC

## 2023-10-01 MED ORDER — HYDROXYZINE PAMOATE 25 MG PO CAPS: 25.0000 mg | ORAL_CAPSULE | Freq: Every evening | ORAL | 1 refills | Status: DC | PRN

## 2023-10-01 MED ORDER — METOPROLOL SUCCINATE ER 25 MG PO TB24: 25.0000 mg | ORAL_TABLET | Freq: Every day | ORAL | 1 refills | Status: DC

## 2023-10-01 MED ORDER — BLOOD GLUCOSE MONITORING SUPPL DEVI: 1.0000 | Freq: Three times a day (TID) | 0 refills | Status: DC

## 2023-10-01 NOTE — Progress Notes (Signed)
 Subjective:  Patient ID: Vanessa [name], female    DOB: [birth year], 48 y.o.   MRN: [id number]  Patient Care Team: Severa Rock HERO, FNP as PCP - General ([facility], [facility], Dino HERO, MD as Physician Assistant (Neurosurgery) Dorrine Ditch, MD as Referring Physician (Physical Medicine and Rehabilitation)   Chief Complaint:  Diabetes (4-5 month follow up ), Cough, and Nasal Congestion (X 2 days )   HPI: Vanessa [name] is a 49 y.o. female presenting on 10/01/2023 for Diabetes (4-5 month follow up ), Cough, and Nasal Congestion (X 2 days )   Vanessa [name] is a 48 year old female with idiopathic intracranial hypertension and hypertension who presents with cold symptoms and elevated blood pressure.  She has been experiencing cold symptoms for the past two days, starting with a scratchy throat. She uses cough syrup, Flonase nasal spray, and Aleve  Cold & Sinus for relief, but notes that Aleve  only provides temporary relief. She describes a sensation of her head feeling like it's going to explode, which she attributes to pressure changes due to the weather.  She has a history of idiopathic intracranial hypertension, which worsens with rain. Her shunts function well when the weather is stable, and she denies any significant changes in her condition related to this.  She forgot to take her blood pressure medications this morning. She is prescribed lisinopril  and spironolactone  for hypertension.  She has diabetes and reports her blood sugars are well-controlled. Her regimen includes once weekly injectables and Jardiance . No increased hunger, thirst, or urination. She denies numbness, tingling, or sores on her feet, though she notes they are very dry.  She has been out of montelukast  (Singulair ) for about a month, which helps with her congestion.  She reports her legs have been swollen for a day or two, which she attributes to her dermatosclerosis. She is  currently taking Diamox  and Lasix  but has not taken today.      Relevant past medical, surgical, family, and social history reviewed and updated as indicated.  Allergies and medications reviewed and updated. Data reviewed: Chart in Epic.   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   Hypertension    Migraines    Pseudotumor (inflammatory) of orbit    Sepsis (HCC) 02/2019   Sleep apnea    Vaginal Pap smear, abnormal    Vision loss     Past Surgical History:  Procedure Laterality Date   arm surgery Left 08/20/2022   BRAIN SURGERY     CSF SHUNT      Social History   Socioeconomic History   Marital status: Single    Spouse name: Not on file   Number of children: 0   Years of education: Not on file   Highest education level: Some college, no degree  Occupational History   Occupation: disability  Tobacco Use   Smoking status: Never   Smokeless tobacco: Never  Vaping Use   Vaping status: Never Used  Substance and Sexual Activity   Alcohol use: No   Drug use: No   Sexual activity: Not Currently    Birth control/protection: Condom  Other Topics Concern   Not on file  Social History Narrative   Lives with parents   Right handed   Caffeine: maybe 2 cups/day   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   Social Drivers of Health   Financial Resource Strain: Medium Risk (10/01/2023)  Overall Financial Resource Strain (CARDIA)    Difficulty of Paying Living Expenses: Somewhat hard  Food Insecurity: No Food Insecurity (10/01/2023)   Hunger Vital Sign    Worried About Running Out of Food in the Last Year: Never true    Ran Out of Food in the Last Year: Never true  Transportation Needs: No Transportation Needs (10/01/2023)   PRAPARE - Administrator, Civil Service (Medical): No    Lack of Transportation (Non-Medical): No  Physical Activity: Insufficiently Active (10/01/2023)   Exercise Vital Sign    Days of Exercise per Week: 2 days     Minutes of Exercise per Session: 10 min  Stress: No Stress Concern Present (10/01/2023)   [employer] of Occupational Health - Occupational Stress Questionnaire    Feeling of Stress: Not at all  Social Connections: Moderately Integrated (10/01/2023)   Social Connection and Isolation Panel    Frequency of Communication with Friends and Family: More than three times a week    Frequency of Social Gatherings with Friends and Family: More than three times a week    Attends Religious Services: More than 4 times per year    Active Member of [employer] or Organizations: Yes    Attends Banker Meetings: 1 to 4 times per year    Marital Status: Never married  Intimate Partner Violence: Not At Risk (06/20/2023)   Received from [facility]   HITS    Over the last 12 months how often did your partner physically hurt you?: Never    Over the last 12 months how often did your partner insult you or talk down to you?: Never    Over the last 12 months how often did your partner threaten you with physical harm?: Never    Over the last 12 months how often did your partner scream or curse at you?: Never    Outpatient Encounter Medications as of 10/01/2023  Medication Sig   acetaZOLAMIDE  (DIAMOX ) 125 MG tablet TAKE 2 TABLETS (250 MG TOTAL) BY MOUTH 2 (TWO) TIMES DAILY.   botulinum toxin Type A  (BOTOX ) 200 units injection Inject 200 Units into the muscle every 3 (three) months. To be administer by provider and dispose any remainder.   Continuous Glucose Sensor (FREESTYLE LIBRE 3 SENSOR) MISC Place 1 sensor on the skin every 14 days. Use to check glucose continuously   cyclobenzaprine  (FLEXERIL ) 10 MG tablet TAKE 1 TABLET BY MOUTH THREE TIMES A DAY AS NEEDED FOR MUSCLE SPASMS   dicyclomine (BENTYL) 20 MG tablet Take 20 mg by mouth 2 (two) times daily.   empagliflozin  (JARDIANCE ) 25 MG TABS tablet Take 1 tablet (25 mg total) by mouth daily before breakfast.   EPINEPHrine  (EPIPEN  2-PAK) 0.3 mg/0.3 mL IJ  SOAJ injection Inject 0.3 mg into the muscle as needed for anaphylaxis.   ferrous sulfate  (FE TABS) 325 (65 FE) MG EC tablet Take 1 tablet (325 mg total) by mouth daily with breakfast.   flunisolide  (NASALIDE ) 25 MCG/ACT (0.025%) SOLN Instill 2 sprays into each nostril twice daily.   Fremanezumab -vfrm (AJOVY ) 225 MG/1.5ML SOSY Inject 1.5 mLs into the skin every 30 (thirty) days.   Lancets (ONETOUCH DELICA PLUS LANCET33G) MISC Check BS 3 times a day Dx E11.65   loratadine  (CLARITIN ) 10 MG tablet Take 1 tablet by mouth daily.   Multiple Vitamin (MULTIVITAMIN) tablet Take 1 tablet by mouth daily.   Oxycodone  HCl 10 MG TABS Take 1 tablet (10 mg total) by mouth 2 (two)  times daily as needed.   Oxycodone  HCl 10 MG TABS Take 1 tablet (10 mg total) by mouth 2 (two) times daily as needed.   Oxycodone  HCl 10 MG TABS Take 1 tablet (10 mg total) by mouth 2 (two) times daily as needed.   promethazine -dextromethorphan  (PROMETHAZINE -DM) 6.25-15 MG/5ML syrup Take 5 mLs by mouth 4 (four) times daily as needed for cough.   Rimegepant Sulfate (NURTEC) 75 MG TBDP Take 75 mg by mouth daily as needed. Onset migraine (max one daily)   Semaglutide , 2 MG/DOSE, (OZEMPIC , 2 MG/DOSE,) 8 MG/3ML SOPN INJECT 2 MGS SUBCUTANEOUSLY AS DIRECTED ONCE A WEEK   [DISCONTINUED] amitriptyline  (ELAVIL ) 50 MG tablet TAKE 1 TABLET BY MOUTH EVERY DAY   [DISCONTINUED] atorvastatin  (LIPITOR) 80 MG tablet TAKE 1 TABLET BY MOUTH EVERY DAY   [DISCONTINUED] Blood Glucose Monitoring Suppl DEVI 1 each by Does not apply route in the morning, at noon, and at bedtime. May substitute to any manufacturer covered by patient's insurance.   [DISCONTINUED] furosemide  (LASIX ) 40 MG tablet TAKE 1 TABLET BY MOUTH EVERY DAY   [DISCONTINUED] glucose blood (ONETOUCH ULTRA) test strip DX:E11.65   [DISCONTINUED] hydrOXYzine  (VISTARIL ) 25 MG capsule TAKE 1-2 CAPSULES (25-50 MG TOTAL) BY MOUTH AT BEDTIME AND MAY REPEAT DOSE ONE TIME IF NEEDED.   [DISCONTINUED]  lisinopril  (ZESTRIL ) 40 MG tablet TAKE 1 TABLET BY MOUTH EVERY DAY   [DISCONTINUED] metoprolol  succinate (TOPROL -XL) 25 MG 24 hr tablet TAKE 1 TABLET (25 MG TOTAL) BY MOUTH DAILY.   [DISCONTINUED] montelukast  (SINGULAIR ) 10 MG tablet Take 1 tablet by mouth daily at bedtime.   [DISCONTINUED] spironolactone  (ALDACTONE ) 100 MG tablet TAKE 1 TABLET BY MOUTH TWICE A DAY   amitriptyline  (ELAVIL ) 50 MG tablet Take 1 tablet (50 mg total) by mouth daily.   atorvastatin  (LIPITOR) 80 MG tablet Take 1 tablet (80 mg total) by mouth daily.   Blood Glucose Monitoring Suppl DEVI 1 each by Does not apply route in the morning, at noon, and at bedtime. May substitute to any manufacturer covered by patient's insurance.   furosemide  (LASIX ) 40 MG tablet Take 1 tablet (40 mg total) by mouth daily.   glucose blood (ONETOUCH ULTRA) test strip DX:E11.65   hydrOXYzine  (VISTARIL ) 25 MG capsule Take 1-2 capsules (25-50 mg total) by mouth at bedtime and may repeat dose one time if needed.   lisinopril  (ZESTRIL ) 40 MG tablet Take 1 tablet (40 mg total) by mouth daily.   metoprolol  succinate (TOPROL -XL) 25 MG 24 hr tablet Take 1 tablet (25 mg total) by mouth daily.   montelukast  (SINGULAIR ) 10 MG tablet Take 1 tablet (10 mg total) by mouth at bedtime.   spironolactone  (ALDACTONE ) 100 MG tablet Take 1 tablet (100 mg total) by mouth 2 (two) times daily.   No facility-administered encounter medications on file as of 10/01/2023.    Allergies  Allergen Reactions   Iodinated Contrast Media Anaphylaxis, Cough, Itching, Other (See Comments), Photosensitivity, Shortness Of Breath and Swelling    Migraine instantly  update, Migraine instantly  update    update update update  update Migraine instantly    update Migraine instantly update Migraine instantly  Migraine instantly    Migraine instantly  update, Migraine instantly  update  update update update  update Migraine instantly  update Migraine instantly update Migraine  instantly    update, Migraine instantly    update Migraine instantly  Topical OK per patient  Providone-Iodine 10% Solution used for skin prep   Metrizamide Other (See Comments), Itching, Photosensitivity,  Shortness Of Breath and Swelling    update Migraine instantly   Mushroom Extract Complex (Obsolete) Itching    Throat itching with cough   Other Itching    Throat itching with cough   Peanut Oil Anaphylaxis and Other (See Comments)    update   Peanut-Containing Drug Products Anaphylaxis, Itching and Other (See Comments)    Itching throat with cough  update  Itching throat with cough    Itching throat with cough  update    update   Diflucan  In Dextrose [Fluconazole  In Dextrose] Hives   Iodine-131 Other (See Comments)    update    Pertinent ROS per HPI, otherwise unremarkable      Objective:  BP (!) 178/88 (Cuff Size: Large)   Pulse 73   Temp (!) 97.5 F (36.4 C)   Ht 5' 10 (1.778 m)   Wt (!) 331 lb 12.8 oz (150.5 kg)   SpO2 98%   BMI 47.61 kg/m    Wt Readings from Last 3 Encounters:  10/01/23 (!) 331 lb 12.8 oz (150.5 kg)  04/30/23 (!) 333 lb 3.2 oz (151.1 kg)  03/18/23 (!) 329 lb 9.6 oz (149.5 kg)    Physical Exam Vitals and nursing note reviewed.  Constitutional:      General: She is not in acute distress.    Appearance: She is morbidly obese. She is not ill-appearing, toxic-appearing or diaphoretic.  HENT:     Head: Normocephalic and atraumatic.     Right Ear: A middle ear effusion is present. Tympanic membrane is not erythematous or bulging.     Left Ear: A middle ear effusion is present. Tympanic membrane is not erythematous or bulging.     Nose: Congestion present. No rhinorrhea.     Right Turbinates: Enlarged. Not swollen.     Left Turbinates: Enlarged. Not swollen.     Right Sinus: No maxillary sinus tenderness or frontal sinus tenderness.     Left Sinus: No maxillary sinus tenderness or frontal sinus tenderness.     Mouth/Throat:      Lips: Pink.     Mouth: Mucous membranes are moist.     Pharynx: Oropharynx is clear. Postnasal drip present. No pharyngeal swelling, oropharyngeal exudate, posterior oropharyngeal erythema or uvula swelling.  Eyes:     Conjunctiva/sclera: Conjunctivae normal.     Comments: Exotropia of right eye, blind in right eye   Cardiovascular:     Rate and Rhythm: Normal rate and regular rhythm.     Heart sounds: Normal heart sounds.     Comments: Chronic venous statis changes to bilateral lower extremities, right greater than left Pulmonary:     Effort: Pulmonary effort is normal.     Breath sounds: Normal breath sounds.  Musculoskeletal:     Cervical back: Neck supple.     Right lower leg: Edema present.     Left lower leg: Edema present.  Lymphadenopathy:     Cervical: No cervical adenopathy.  Skin:    General: Skin is warm and dry.     Capillary Refill: Capillary refill takes less than 2 seconds.  Neurological:     General: No focal deficit present.     Mental Status: She is alert and oriented to person, place, and time.  Psychiatric:        Mood and Affect: Mood normal.        Behavior: Behavior normal.        Thought Content: Thought content normal.  Judgment: Judgment normal.       Results for orders placed or performed in visit on 03/18/23  Bayer DCA Hb A1c Waived   Collection Time: 03/18/23 10:45 AM  Result Value Ref Range   HB A1C (BAYER DCA - WAIVED) 5.6 4.8 - 5.6 %  Lipid panel   Collection Time: 03/18/23 11:45 AM  Result Value Ref Range   Cholesterol, Total 178 100 - 199 mg/dL   Triglycerides 77 0 - 149 mg/dL   HDL 43 >60 mg/dL   VLDL Cholesterol Cal 14 5 - 40 mg/dL   LDL Chol Calc (NIH) 878 (H) 0 - 99 mg/dL   Chol/HDL Ratio 4.1 0.0 - 4.4 ratio  CBC with Differential/Platelet   Collection Time: 03/18/23 11:45 AM  Result Value Ref Range   WBC 10.0 3.4 - 10.8 x10E3/uL   RBC 4.43 3.77 - 5.28 x10E6/uL   Hemoglobin 9.5 (L) 11.1 - 15.9 g/dL   Hematocrit 67.1  (L) 34.0 - 46.6 %   MCV 74 (L) 79 - 97 fL   MCH 21.4 (L) 26.6 - 33.0 pg   MCHC 29.0 (L) 31.5 - 35.7 g/dL   RDW 82.2 (H) 88.2 - 84.5 %   Platelets 267 150 - 450 x10E3/uL   Neutrophils 67 Not Estab. %   Lymphs 27 Not Estab. %   Monocytes 5 Not Estab. %   Eos 1 Not Estab. %   Basos 0 Not Estab. %   Neutrophils Absolute 6.7 1.4 - 7.0 x10E3/uL   Lymphocytes Absolute 2.7 0.7 - 3.1 x10E3/uL   Monocytes Absolute 0.5 0.1 - 0.9 x10E3/uL   EOS (ABSOLUTE) 0.1 0.0 - 0.4 x10E3/uL   Basophils Absolute 0.0 0.0 - 0.2 x10E3/uL   Immature Granulocytes 0 Not Estab. %   Immature Grans (Abs) 0.0 0.0 - 0.1 x10E3/uL  CMP14+EGFR   Collection Time: 03/18/23 11:45 AM  Result Value Ref Range   Glucose 119 (H) 70 - 99 mg/dL   BUN 14 6 - 24 mg/dL   Creatinine, Ser 9.12 0.57 - 1.00 mg/dL   eGFR 83 >40 fO/fpw/8.26   BUN/Creatinine Ratio 16 9 - 23   Sodium 136 134 - 144 mmol/L   Potassium 4.2 3.5 - 5.2 mmol/L   Chloride 102 96 - 106 mmol/L   CO2 23 20 - 29 mmol/L   Calcium  8.9 8.7 - 10.2 mg/dL   Total Protein 7.0 6.0 - 8.5 g/dL   Albumin 4.0 3.9 - 4.9 g/dL   Globulin, Total 3.0 1.5 - 4.5 g/dL   Bilirubin Total 0.4 0.0 - 1.2 mg/dL   Alkaline Phosphatase 61 44 - 121 IU/L   AST 14 0 - 40 IU/L   ALT 20 0 - 32 IU/L       Pertinent labs & imaging results that were available during my care of the patient were reviewed by me and considered in my medical decision making.  Assessment & Plan:  Vanessa [name] was seen today for diabetes, cough and nasal congestion.  Diagnoses and all orders for this visit:  Type 2 diabetes mellitus with hyperglycemia, without long-term current use of insulin (HCC) -     Bayer DCA Hb A1c Waived -     CBC with Differential/Platelet -     atorvastatin  (LIPITOR) 80 MG tablet; Take 1 tablet (80 mg total) by mouth daily. -     Microalbumin / creatinine urine ratio -     Blood Glucose Monitoring Suppl DEVI; 1 each by Does not apply route in the  morning, at noon, and at bedtime. May  substitute to any manufacturer covered by patient's insurance. -     glucose blood (ONETOUCH ULTRA) test strip; DX:E11.65  Hypertension associated with type 2 diabetes mellitus (HCC) -     Bayer DCA Hb A1c Waived -     CBC with Differential/Platelet -     spironolactone  (ALDACTONE ) 100 MG tablet; Take 1 tablet (100 mg total) by mouth 2 (two) times daily. -     metoprolol  succinate (TOPROL -XL) 25 MG 24 hr tablet; Take 1 tablet (25 mg total) by mouth daily. -     lisinopril  (ZESTRIL ) 40 MG tablet; Take 1 tablet (40 mg total) by mouth daily. -     furosemide  (LASIX ) 40 MG tablet; Take 1 tablet (40 mg total) by mouth daily. -     Microalbumin / creatinine urine ratio  Hyperlipidemia associated with type 2 diabetes mellitus (HCC) -     Bayer DCA Hb A1c Waived -     atorvastatin  (LIPITOR) 80 MG tablet; Take 1 tablet (80 mg total) by mouth daily.  Morbid obesity with BMI of 45.0-49.9, adult (HCC) -     Bayer DCA Hb A1c Waived -     CBC with Differential/Platelet  Seasonal allergies -     montelukast  (SINGULAIR ) 10 MG tablet; Take 1 tablet (10 mg total) by mouth at bedtime.  Primary insomnia -     hydrOXYzine  (VISTARIL ) 25 MG capsule; Take 1-2 capsules (25-50 mg total) by mouth at bedtime and may repeat dose one time if needed.  IIH (idiopathic intracranial hypertension) -     spironolactone  (ALDACTONE ) 100 MG tablet; Take 1 tablet (100 mg total) by mouth 2 (two) times daily.  Chronic migraine without aura without status migrainosus, not intractable -     amitriptyline  (ELAVIL ) 50 MG tablet; Take 1 tablet (50 mg total) by mouth daily.       Hypertension Blood pressure is significantly elevated at 197/80, likely contributing to headaches. Missed morning doses of antihypertensive medications. Immediate management required due to idiopathic intracranial hypertension. Aware to stop over the counter cold and cough medications.  - Take lisinopril , spironolactone , and metoprolol  immediately upon  returning home - Check blood pressure one hour after taking medications - If blood pressure remains above 150-160, contact provider or go to the emergency room  Idiopathic intracranial hypertension Condition well-managed with current shunt settings, but weather changes exacerbate symptoms. Elevated blood pressure may worsen condition. - Ensure blood pressure is controlled to prevent exacerbation of intracranial hypertension  Acute upper respiratory infection with allergic rhinitis Symptoms started two days ago with a scratchy throat, progressing to congestion and headache. Likely allergy-driven with fluid behind ears and eustachian tube enlargement. No fever reported. - Restart Singulair  (montelukast ) at night to help with congestion - Continue using Flonase nasal spray - Report if symptoms do not improve in four days  Chronic lower extremity edema with dermatosclerosis Legs are slightly more swollen than usual. Swelling present for one to two days. - Take Lasix  (furosemide ) as soon as possible  Type 2 diabetes mellitus without complications Blood sugars are well-controlled with current regimen. A1c is 5.8. No increased hunger, thirst, or urination. No foot complications noted. - Continue current diabetes medications including Ozempic  and Jardiance  - Refill prescription for test strips - Monitor for signs of yeast infection or vaginal irritation due to Jardiance           Continue all other maintenance medications.  Follow up plan: Return in about 8  weeks (around 11/26/2023) for HTN.   Continue healthy lifestyle choices, including diet (rich in fruits, vegetables, and lean proteins, and low in salt and simple carbohydrates) and exercise (at least 30 minutes of moderate physical activity daily).  Educational handout given for DM  The above assessment and management plan was discussed with the patient. The patient verbalized understanding of and has agreed to the management plan.  Patient is aware to call the clinic if they develop any new symptoms or if symptoms persist or worsen. Patient is aware when to return to the clinic for a follow-up visit. Patient educated on when it is appropriate to go to the emergency department.   Vanessa Bruns, FNP-C [facility] [phone]

## 2023-10-01 NOTE — Patient Instructions (Signed)

## 2023-10-02 ENCOUNTER — Ambulatory Visit: Payer: Self-pay | Admitting: Family Medicine

## 2023-10-02 DIAGNOSIS — D649 Anemia, unspecified: Secondary | ICD-10-CM

## 2023-10-02 LAB — MICROALBUMIN / CREATININE URINE RATIO
Creatinine, Urine: 129.1 mg/dL
Microalb/Creat Ratio: 4 mg/g{creat} (ref 0–29)
Microalbumin, Urine: 5.4 ug/mL

## 2023-10-20 ENCOUNTER — Other Ambulatory Visit: Payer: Self-pay | Admitting: *Deleted

## 2023-10-20 MED ORDER — ONETOUCH ULTRA 2 W/DEVICE KIT: PACK | 0 refills | Status: AC

## 2023-10-20 NOTE — Telephone Encounter (Signed)
 Fax from [facility] Accu-Chek guide monitor not on formulary, sending OneTouch

## 2023-11-26 ENCOUNTER — Ambulatory Visit: Admitting: Family Medicine

## 2023-11-28 ENCOUNTER — Inpatient Hospital Stay: Attending: Hematology and Oncology | Admitting: Hematology and Oncology

## 2023-11-28 ENCOUNTER — Encounter: Payer: Self-pay | Admitting: Hematology and Oncology

## 2023-11-28 ENCOUNTER — Inpatient Hospital Stay

## 2023-11-28 VITALS — BP 152/87 | HR 82 | Temp 98.1°F | Resp 18 | Ht 70.0 in | Wt 323.2 lb

## 2023-11-28 DIAGNOSIS — D509 Iron deficiency anemia, unspecified: Secondary | ICD-10-CM | POA: Insufficient documentation

## 2023-11-28 LAB — CBC WITH DIFFERENTIAL/PLATELET
Abs Immature Granulocytes: 0.01 K/uL (ref 0.00–0.07)
Basophils Absolute: 0 K/uL (ref 0.0–0.1)
Basophils Relative: 0 %
Eosinophils Absolute: 0.2 K/uL (ref 0.0–0.5)
Eosinophils Relative: 2 %
HCT: 35.6 % — ABNORMAL LOW (ref 36.0–46.0)
Hemoglobin: 10.9 g/dL — ABNORMAL LOW (ref 12.0–15.0)
Immature Granulocytes: 0 %
Lymphocytes Relative: 28 %
Lymphs Abs: 2.2 K/uL (ref 0.7–4.0)
MCH: 22.8 pg — ABNORMAL LOW (ref 26.0–34.0)
MCHC: 30.6 g/dL (ref 30.0–36.0)
MCV: 74.5 fL — ABNORMAL LOW (ref 80.0–100.0)
Monocytes Absolute: 0.5 K/uL (ref 0.1–1.0)
Monocytes Relative: 7 %
Neutro Abs: 4.9 K/uL (ref 1.7–7.7)
Neutrophils Relative %: 63 %
Platelets: 233 K/uL (ref 150–400)
RBC: 4.78 MIL/uL (ref 3.87–5.11)
RDW: 19.1 % — ABNORMAL HIGH (ref 11.5–15.5)
WBC: 7.8 K/uL (ref 4.0–10.5)
nRBC: 0 % (ref 0.0–0.2)

## 2023-11-28 LAB — IRON AND TIBC
Iron: 32 ug/dL (ref 28–170)
Saturation Ratios: 8 % — ABNORMAL LOW (ref 10.4–31.8)
TIBC: 395 ug/dL (ref 250–450)
UIBC: 363 ug/dL

## 2023-11-28 LAB — FERRITIN: Ferritin: 32 ng/mL (ref 11–307)

## 2023-11-28 NOTE — Assessment & Plan Note (Signed)
 Lab review: 03/01/2022: Hemoglobin 9.7, MCV 71, RDW 18.6, platelets 255 09/03/2022: Hemoglobin 9.6, MCV 73, RDW 18.1 10/01/2023: Hemoglobin 9.7, MCV 74, RDW 17.6  Differential diagnosis microcytic anemia: Iron deficiency anemia versus hemoglobinopathy/thalassemia  Counseling: I explained to the patient pathway for iron absorption and the different causes of iron deficiencies.  Plan: Check iron studies, B12 and folate Evaluate for blood loss if she was found to be iron deficient If iron deficiency is confirmed: Recommend IV iron infusion If iron deficiency is not identified, we will work the patient up for thalassemia.

## 2023-11-28 NOTE — Progress Notes (Signed)
 Patient Care Team: Severa Rock HERO, FNP as PCP - General (Family Medicine) Steva, [facility], Dino HERO, MD as Physician Assistant (Neurosurgery) Dorrine Ditch, MD as Referring Physician (Physical Medicine and Rehabilitation)  DIAGNOSIS:  Encounter Diagnosis  Name Primary?   Microcytic anemia Yes   CHIEF COMPLIANT:   HISTORY OF PRESENT ILLNESS:  History of Present Illness Vanessa [name] is a 48 year old female who presents with symptoms of iron deficiency anemia.  She experiences constant fatigue, poor sleep quality, cramping in her hands and legs, and restlessness at night over the past year. She has cravings for ice chips. Her hemoglobin levels are consistently around 9.5 g/dL. She is not on iron supplements, only a multivitamin. Her menstrual cycles have been tapering off. She has not seen a gastroenterologist and has not noticed any blood in her stool.     ALLERGIES:  is allergic to iodinated contrast media, metrizamide, mushroom extract complex (obsolete), other, peanut oil, peanut-containing drug products, diflucan  in dextrose [fluconazole  in dextrose], and iodine-131.  MEDICATIONS:  Current Outpatient Medications  Medication Sig Dispense Refill   acetaZOLAMIDE  (DIAMOX ) 125 MG tablet TAKE 2 TABLETS (250 MG TOTAL) BY MOUTH 2 (TWO) TIMES DAILY. 180 tablet 1   amitriptyline  (ELAVIL ) 50 MG tablet Take 1 tablet (50 mg total) by mouth daily. 90 tablet 1   atorvastatin  (LIPITOR) 80 MG tablet Take 1 tablet (80 mg total) by mouth daily. 90 tablet 1   Blood Glucose Monitoring Suppl (ONE TOUCH ULTRA 2) w/Device KIT Check BS 3 times a day Dx E11.65 1 kit 0   botulinum toxin Type A  (BOTOX ) 200 units injection Inject 200 Units into the muscle every 3 (three) months. To be administer by provider and dispose any remainder. 1 each 2   Continuous Glucose Sensor (FREESTYLE LIBRE 3 SENSOR) MISC Place 1 sensor on the skin every 14 days. Use to check glucose continuously  2 each 10   cyclobenzaprine  (FLEXERIL ) 10 MG tablet TAKE 1 TABLET BY MOUTH THREE TIMES A DAY AS NEEDED FOR MUSCLE SPASMS 60 tablet 2   dicyclomine (BENTYL) 20 MG tablet Take 20 mg by mouth 2 (two) times daily.     empagliflozin  (JARDIANCE ) 25 MG TABS tablet Take 1 tablet (25 mg total) by mouth daily before breakfast. 30 tablet 0   EPINEPHrine  (EPIPEN  2-PAK) 0.3 mg/0.3 mL IJ SOAJ injection Inject 0.3 mg into the muscle as needed for anaphylaxis. 2 each 1   ferrous sulfate  (FE TABS) 325 (65 FE) MG EC tablet Take 1 tablet (325 mg total) by mouth daily with breakfast. 60 tablet 1   flunisolide  (NASALIDE ) 25 MCG/ACT (0.025%) SOLN Instill 2 sprays into each nostril twice daily. 25 mL 0   Fremanezumab -vfrm (AJOVY ) 225 MG/1.5ML SOSY Inject 1.5 mLs into the skin every 30 (thirty) days. 4.5 mL 3   furosemide  (LASIX ) 40 MG tablet Take 1 tablet (40 mg total) by mouth daily. 90 tablet 1   glucose blood (ONETOUCH ULTRA) test strip DX:E11.65 100 strip 0   hydrOXYzine  (VISTARIL ) 25 MG capsule Take 1-2 capsules (25-50 mg total) by mouth at bedtime and may repeat dose one time if needed. 180 capsule 1   Lancets (ONETOUCH DELICA PLUS LANCET33G) MISC Check BS 3 times a day Dx E11.65 300 each 3   lisinopril  (ZESTRIL ) 40 MG tablet Take 1 tablet (40 mg total) by mouth daily. 90 tablet 1   loratadine  (CLARITIN ) 10 MG tablet Take 1 tablet by mouth daily. 90 tablet 1  metoprolol  succinate (TOPROL -XL) 25 MG 24 hr tablet Take 1 tablet (25 mg total) by mouth daily. 90 tablet 1   montelukast  (SINGULAIR ) 10 MG tablet Take 1 tablet (10 mg total) by mouth at bedtime. 90 tablet 1   Multiple Vitamin (MULTIVITAMIN) tablet Take 1 tablet by mouth daily.     Oxycodone  HCl 10 MG TABS Take 1 tablet (10 mg total) by mouth 2 (two) times daily as needed. 60 tablet 0   Oxycodone  HCl 10 MG TABS Take 1 tablet (10 mg total) by mouth 2 (two) times daily as needed. 60 tablet 0   Oxycodone  HCl 10 MG TABS Take 1 tablet (10 mg total) by mouth 2  (two) times daily as needed. 60 tablet 0   promethazine -dextromethorphan  (PROMETHAZINE -DM) 6.25-15 MG/5ML syrup Take 5 mLs by mouth 4 (four) times daily as needed for cough. 118 mL 0   Rimegepant Sulfate (NURTEC) 75 MG TBDP Take 75 mg by mouth daily as needed. Onset migraine (max one daily) 10 tablet 11   Semaglutide , 2 MG/DOSE, (OZEMPIC , 2 MG/DOSE,) 8 MG/3ML SOPN INJECT 2 MGS SUBCUTANEOUSLY AS DIRECTED ONCE A WEEK 9 mL 0   spironolactone  (ALDACTONE ) 100 MG tablet Take 1 tablet (100 mg total) by mouth 2 (two) times daily. 180 tablet 1   No current facility-administered medications for this visit.    PHYSICAL EXAMINATION: ECOG PERFORMANCE STATUS: 1 - Symptomatic but completely ambulatory  There were no vitals filed for this visit. There were no vitals filed for this visit.  Physical Exam   (exam performed in the presence of a chaperone)  LABORATORY DATA:  I have reviewed the data as listed    Latest Ref Rng & Units 03/18/2023   11:45 AM 09/03/2022   12:57 PM 03/01/2022   12:59 PM  CMP  Glucose 70 - 99 mg/dL 880  877  74   BUN 6 - 24 mg/dL 14  8  9    Creatinine 0.57 - 1.00 mg/dL 9.12  9.19  9.09   Sodium 134 - 144 mmol/L 136  136  139   Potassium 3.5 - 5.2 mmol/L 4.2  3.9  4.0   Chloride 96 - 106 mmol/L 102  104  104   CO2 20 - 29 mmol/L 23  21  22    Calcium  8.7 - 10.2 mg/dL 8.9  8.6  8.8   Total Protein 6.0 - 8.5 g/dL 7.0  6.7  7.2   Total Bilirubin 0.0 - 1.2 mg/dL 0.4  0.2  0.5   Alkaline Phos 44 - 121 IU/L 61  62  66   AST 0 - 40 IU/L 14  14  17    ALT 0 - 32 IU/L 20  18  15      Lab Results  Component Value Date   WBC 5.5 10/01/2023   HGB 9.7 (L) 10/01/2023   HCT 32.0 (L) 10/01/2023   MCV 74 (L) 10/01/2023   PLT 224 10/01/2023   NEUTROABS 3.2 10/01/2023    ASSESSMENT & PLAN:  Microcytic anemia Lab review: 03/01/2022: Hemoglobin 9.7, MCV 71, RDW 18.6, platelets 255 09/03/2022: Hemoglobin 9.6, MCV 73, RDW 18.1 10/01/2023: Hemoglobin 9.7, MCV 74, RDW 17.6  Differential  diagnosis microcytic anemia: Iron deficiency anemia versus hemoglobinopathy/thalassemia Since the patient has symptoms of pica (ice chips) and also restless legs and muscle cramps, I strongly suspect this is classic iron deficiency. We discussed the different causes of iron deficiency.  She has not seen any blood in the stool and her menstrual cycles  have lightened out significantly.  Therefore I recommended that she see gastroenterology.  Counseling: I explained to the patient pathway for iron absorption and the different causes of iron deficiencies.  Plan: Check iron studies  Plan for IV iron infusion Gastroenterology consultation  Follow-up in 3 months with labs   No orders of the defined types were placed in this encounter.  The patient has a good understanding of the overall plan. she agrees with it. she will call with any problems that may develop before the next visit here. Total time spent: 30 mins including face to face time and time spent for planning, charting and co-ordination of care   Viinay K Gumecindo Hopkin, MD 11/28/23

## 2023-12-01 ENCOUNTER — Encounter (INDEPENDENT_AMBULATORY_CARE_PROVIDER_SITE_OTHER): Payer: Self-pay | Admitting: *Deleted

## 2023-12-02 ENCOUNTER — Other Ambulatory Visit: Payer: Self-pay | Admitting: Family Medicine

## 2023-12-02 DIAGNOSIS — E1165 Type 2 diabetes mellitus with hyperglycemia: Secondary | ICD-10-CM

## 2023-12-05 ENCOUNTER — Inpatient Hospital Stay

## 2023-12-09 ENCOUNTER — Ambulatory Visit: Admitting: Family Medicine

## 2023-12-09 VITALS — BP 169/80 | HR 81 | Temp 98.1°F | Ht 70.0 in | Wt 328.0 lb

## 2023-12-09 DIAGNOSIS — I152 Hypertension secondary to endocrine disorders: Secondary | ICD-10-CM

## 2023-12-09 DIAGNOSIS — Z7984 Long term (current) use of oral hypoglycemic drugs: Secondary | ICD-10-CM

## 2023-12-09 DIAGNOSIS — E1159 Type 2 diabetes mellitus with other circulatory complications: Secondary | ICD-10-CM | POA: Diagnosis not present

## 2023-12-09 MED ORDER — LOSARTAN POTASSIUM 100 MG PO TABS: 100.0000 mg | ORAL_TABLET | Freq: Every day | ORAL | 3 refills | Status: AC

## 2023-12-09 NOTE — Progress Notes (Signed)
 Subjective:  Patient ID: Vanessa [name], female    DOB: 1975/08/29, 48 y.o.   MRN: [id number]  Patient Care Team: Severa Rock HERO, FNP as PCP - General (Family Medicine) [facility], [facility], Dino HERO, MD as Physician Assistant (Neurosurgery) Dorrine Ditch, MD as Referring Physician (Physical Medicine and Rehabilitation)   Chief Complaint:  Hypertension (8 week follow up )   HPI: Vanessa [name] is a 48 y.o. female presenting on 12/09/2023 for Hypertension (8 week follow up )    Vanessa [name] is a 48 year old female with hypertension who presents for a blood pressure follow-up.  She has not taken her blood pressure medications this morning as she usually takes them at night. She has not been checking her blood pressure at home due to a broken blood pressure cuff. She is currently on lisinopril , a beta blocker at night, and diuretics during the day.  No increased chest pain, leg swelling, shortness of breath, headaches, or visual changes. She mentions having everyday headaches but nothing new or concerning.  Her weight has decreased from 331 pounds to 328 pounds over the past eight weeks. She attributes this to walking more, although she has had to adjust her routine due to heat and sun exposure.          Relevant past medical, surgical, family, and social history reviewed and updated as indicated.  Allergies and medications reviewed and updated. Data reviewed: Chart in Epic.   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   Hypertension    Microcytic anemia 11/28/2023   Migraines    Pseudotumor (inflammatory) of orbit    Sepsis (HCC) 02/2019   Sleep apnea    Vaginal Pap smear, abnormal    Vision loss     Past Surgical History:  Procedure Laterality Date   arm surgery Left 08/20/2022   BRAIN SURGERY     CSF SHUNT      Social History   Socioeconomic History   Marital status: Single    Spouse name:  Not on file   Number of children: 0   Years of education: Not on file   Highest education level: Some college, no degree  Occupational History   Occupation: disability  Tobacco Use   Smoking status: Never   Smokeless tobacco: Never  Vaping Use   Vaping status: Never Used  Substance and Sexual Activity   Alcohol use: No   Drug use: No   Sexual activity: Not Currently    Birth control/protection: Condom  Other Topics Concern   Not on file  Social History Narrative   Lives with parents   Right handed   Caffeine: maybe 2 cups/day   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   Social Drivers of Health   Financial Resource Strain: Medium Risk (12/09/2023)   Overall Financial Resource Strain (CARDIA)    Difficulty of Paying Living Expenses: Somewhat hard  Food Insecurity: Food Insecurity Present (12/09/2023)   Hunger Vital Sign    Worried About Running Out of Food in the Last Year: Never true    Ran Out of Food in the Last Year: Sometimes true  Transportation Needs: No Transportation Needs (12/09/2023)   PRAPARE - Administrator, Civil Service (Medical): No    Lack of Transportation (Non-Medical): No  Physical Activity: Insufficiently Active (12/09/2023)   Exercise Vital Sign    Days of Exercise per Week: 3 days  Minutes of Exercise per Session: 20 min  Stress: Stress Concern Present (12/09/2023)   [employer] of Occupational Health - Occupational Stress Questionnaire    Feeling of Stress: To some extent  Social Connections: Moderately Integrated (12/09/2023)   Social Connection and Isolation Panel    Frequency of Communication with Friends and Family: More than three times a week    Frequency of Social Gatherings with Friends and Family: More than three times a week    Attends Religious Services: More than 4 times per year    Active Member of [employer] or Organizations: Yes    Attends Banker Meetings: 1 to 4 times per year    Marital Status: Never married   Intimate Partner Violence: Not At Risk (11/28/2023)   Humiliation, Afraid, Rape, and Kick questionnaire    Fear of Current or Ex-Partner: No    Emotionally Abused: No    Physically Abused: No    Sexually Abused: No    Outpatient Encounter Medications as of 12/09/2023  Medication Sig   acetaZOLAMIDE  (DIAMOX ) 125 MG tablet TAKE 2 TABLETS (250 MG TOTAL) BY MOUTH 2 (TWO) TIMES DAILY.   amitriptyline  (ELAVIL ) 50 MG tablet Take 1 tablet (50 mg total) by mouth daily.   atorvastatin  (LIPITOR) 80 MG tablet Take 1 tablet (80 mg total) by mouth daily.   Blood Glucose Monitoring Suppl (ONE TOUCH ULTRA 2) w/Device KIT Check BS 3 times a day Dx E11.65   botulinum toxin Type A  (BOTOX ) 200 units injection Inject 200 Units into the muscle every 3 (three) months. To be administer by provider and dispose any remainder.   Continuous Glucose Sensor (FREESTYLE LIBRE 3 SENSOR) MISC Place 1 sensor on the skin every 14 days. Use to check glucose continuously   cyclobenzaprine  (FLEXERIL ) 10 MG tablet TAKE 1 TABLET BY MOUTH THREE TIMES A DAY AS NEEDED FOR MUSCLE SPASMS   dicyclomine (BENTYL) 20 MG tablet Take 20 mg by mouth 2 (two) times daily.   empagliflozin  (JARDIANCE ) 25 MG TABS tablet Take 1 tablet (25 mg total) by mouth daily before breakfast.   EPINEPHrine  (EPIPEN  2-PAK) 0.3 mg/0.3 mL IJ SOAJ injection Inject 0.3 mg into the muscle as needed for anaphylaxis.   ferrous sulfate  (FE TABS) 325 (65 FE) MG EC tablet Take 1 tablet (325 mg total) by mouth daily with breakfast.   flunisolide  (NASALIDE ) 25 MCG/ACT (0.025%) SOLN Instill 2 sprays into each nostril twice daily.   Fremanezumab -vfrm (AJOVY ) 225 MG/1.5ML SOSY Inject 1.5 mLs into the skin every 30 (thirty) days.   furosemide  (LASIX ) 40 MG tablet Take 1 tablet (40 mg total) by mouth daily.   glucose blood (ONETOUCH ULTRA) test strip DX:E11.65   hydrOXYzine  (VISTARIL ) 25 MG capsule Take 1-2 capsules (25-50 mg total) by mouth at bedtime and may repeat dose one  time if needed.   Lancets (ONETOUCH DELICA PLUS LANCET33G) MISC Check BS 3 times a day Dx E11.65   loratadine  (CLARITIN ) 10 MG tablet Take 1 tablet by mouth daily.   losartan (COZAAR) 100 MG tablet Take 1 tablet (100 mg total) by mouth daily.   metoprolol  succinate (TOPROL -XL) 25 MG 24 hr tablet Take 1 tablet (25 mg total) by mouth daily.   montelukast  (SINGULAIR ) 10 MG tablet Take 1 tablet (10 mg total) by mouth at bedtime.   Multiple Vitamin (MULTIVITAMIN) tablet Take 1 tablet by mouth daily.   Oxycodone  HCl 10 MG TABS Take 1 tablet (10 mg total) by mouth 2 (two) times daily as needed.  Oxycodone  HCl 10 MG TABS Take 1 tablet (10 mg total) by mouth 2 (two) times daily as needed.   Oxycodone  HCl 10 MG TABS Take 1 tablet (10 mg total) by mouth 2 (two) times daily as needed.   promethazine -dextromethorphan  (PROMETHAZINE -DM) 6.25-15 MG/5ML syrup Take 5 mLs by mouth 4 (four) times daily as needed for cough.   Rimegepant Sulfate (NURTEC) 75 MG TBDP Take 75 mg by mouth daily as needed. Onset migraine (max one daily)   Semaglutide , 2 MG/DOSE, (OZEMPIC , 2 MG/DOSE,) 8 MG/3ML SOPN INJECT 2MG  SUBCUTANEOUSLY AS DIRECTED ONCE A WEEK   spironolactone  (ALDACTONE ) 100 MG tablet Take 1 tablet (100 mg total) by mouth 2 (two) times daily.   [DISCONTINUED] lisinopril  (ZESTRIL ) 40 MG tablet Take 1 tablet (40 mg total) by mouth daily.   No facility-administered encounter medications on file as of 12/09/2023.    Allergies  Allergen Reactions   Iodinated Contrast Media Anaphylaxis, Cough, Itching, Other (See Comments), Photosensitivity, Shortness Of Breath and Swelling    Migraine instantly  update, Migraine instantly  update    update update update  update Migraine instantly    update Migraine instantly update Migraine instantly  Migraine instantly    Migraine instantly  update, Migraine instantly  update  update update update  update Migraine instantly  update Migraine instantly update Migraine  instantly    update, Migraine instantly    update Migraine instantly  Topical OK per patient  Providone-Iodine 10% Solution used for skin prep   Metrizamide Other (See Comments), Itching, Photosensitivity, Shortness Of Breath and Swelling    update Migraine instantly   Mushroom Extract Complex (Obsolete) Itching    Throat itching with cough   Other Itching    Throat itching with cough   Peanut Oil Anaphylaxis and Other (See Comments)    update   Peanut-Containing Drug Products Anaphylaxis, Itching and Other (See Comments)    Itching throat with cough  update  Itching throat with cough    Itching throat with cough  update    update   Diflucan  In Dextrose [Fluconazole  In Dextrose] Hives   Iodine-131 Other (See Comments)    update    Pertinent ROS per HPI, otherwise unremarkable      Objective:  BP (!) 169/80   Pulse 81   Temp 98.1 F (36.7 C)   Ht 5' 10 (1.778 m)   Wt (!) 328 lb (148.8 kg)   SpO2 98%   BMI 47.06 kg/m    Wt Readings from Last 3 Encounters:  12/09/23 (!) 328 lb (148.8 kg)  11/28/23 (!) 323 lb 3.1 oz (146.6 kg)  10/01/23 (!) 331 lb 12.8 oz (150.5 kg)    Physical Exam Vitals and nursing note reviewed.  Constitutional:      General: She is not in acute distress.    Appearance: Normal appearance. She is morbidly obese. She is not ill-appearing, toxic-appearing or diaphoretic.  HENT:     Head: Normocephalic and atraumatic.     Mouth/Throat:     Mouth: Mucous membranes are moist.  Eyes:     Conjunctiva/sclera: Conjunctivae normal.     Comments: Exotropia of right eye, blind in right eye   Cardiovascular:     Rate and Rhythm: Normal rate and regular rhythm.     Heart sounds: Normal heart sounds.  Pulmonary:     Effort: Pulmonary effort is normal.     Breath sounds: Normal breath sounds.  Musculoskeletal:     Cervical back: Neck supple.  Right lower leg: Edema present.     Left lower leg: Edema present.  Skin:    General: Skin is  warm and dry.     Capillary Refill: Capillary refill takes less than 2 seconds.  Neurological:     General: No focal deficit present.     Mental Status: She is alert and oriented to person, place, and time.  Psychiatric:        Mood and Affect: Mood normal.        Behavior: Behavior normal. Behavior is cooperative.        Thought Content: Thought content normal.        Judgment: Judgment normal.      Results for orders placed or performed in visit on 11/28/23  Ferritin   Collection Time: 11/28/23 11:02 AM  Result Value Ref Range   Ferritin 32 11 - 307 ng/mL       Pertinent labs & imaging results that were available during my care of the patient were reviewed by me and considered in my medical decision making.  Assessment & Plan:  Chonte was seen today for hypertension.  Diagnoses and all orders for this visit:  Hypertension associated with type 2 diabetes mellitus (HCC) -     BMP8+EGFR -     losartan (COZAAR) 100 MG tablet; Take 1 tablet (100 mg total) by mouth daily.  Morbid obesity (HCC) -     BMP8+EGFR      Hypertension Hypertension is not optimally controlled, with recent readings at 160/100. Lisinopril  may no longer be effective. - Discontinue lisinopril . - Prescribe losartan to be taken in the morning with diuretics. - Continue metoprolol  extended release at night. - Order BMP to assess renal function. - Advise her to purchase a new blood pressure cuff and monitor blood pressure at least two to three times a week, including during symptomatic episodes. - Consider referral to nephrology or hypertension clinic if blood pressure remains uncontrolled after medication adjustment.  Obesity Weight has decreased from 331 lbs to 328 lbs over the past eight weeks. She is increasing physical activity by walking more, contributing to weight loss. - Encourage continued physical activity and walking.          Continue all other maintenance medications.  Follow up  plan: Return in about 8 weeks (around 02/03/2024), or if symptoms worsen or fail to improve, for DM, HTN.   Continue healthy lifestyle choices, including diet (rich in fruits, vegetables, and lean proteins, and low in salt and simple carbohydrates) and exercise (at least 30 minutes of moderate physical activity daily).  Educational handout given for DASH  The above assessment and management plan was discussed with the patient. The patient verbalized understanding of and has agreed to the management plan. Patient is aware to call the clinic if they develop any new symptoms or if symptoms persist or worsen. Patient is aware when to return to the clinic for a follow-up visit. Patient educated on when it is appropriate to go to the emergency department.   Rosaline Bruns, FNP-C [facility] [phone]

## 2023-12-09 NOTE — Patient Instructions (Signed)
 Goal BP:  Less than 130/80  Take your medications faithfully as prescribed. Maintain a healthy weight. Get at least 150 minutes of aerobic exercise per week. Minimize salt intake, less than 2000 mg per day. Minimize alcohol intake.  DASH Eating Plan DASH stands for Dietary Approaches to Stop Hypertension. The DASH eating plan is a healthy eating plan that has been shown to reduce high blood pressure (hypertension). Additional health benefits may include reducing the risk of type 2 diabetes mellitus, heart disease, and stroke. The DASH eating plan may also help with weight loss.  WHAT DO I NEED TO KNOW ABOUT THE DASH EATING PLAN? For the DASH eating plan, you will follow these general guidelines: Choose foods with a percent daily value for sodium of less than 5% (as listed on the food label). Use salt-free seasonings or herbs instead of table salt or sea salt. Check with your health care provider or pharmacist before using salt substitutes. Eat lower-sodium products, often labeled as lower sodium or no salt added. Eat fresh foods. Eat more vegetables, fruits, and low-fat dairy products. Choose whole grains. Look for the word whole as the first word in the ingredient list. Choose fish and skinless chicken or [place] more often than red meat. Limit fish, poultry, and meat to 6 oz (170 g) each day. Limit sweets, desserts, sugars, and sugary drinks. Choose heart-healthy fats. Limit cheese to 1 oz (28 g) per day. Eat more home-cooked food and less restaurant, buffet, and fast food. Limit fried foods. Cook foods using methods other than frying. Limit canned vegetables. If you do use them, rinse them well to decrease the sodium. When eating at a restaurant, ask that your food be prepared with less salt, or no salt if possible.  WHAT FOODS CAN I EAT? Seek help from a dietitian for individual calorie needs.  Grains Whole grain or whole wheat bread. Hoshino rice. Whole grain or whole  wheat pasta. Quinoa, bulgur, and whole grain cereals. Low-sodium cereals. Corn or whole wheat flour tortillas. Whole grain cornbread. Whole grain crackers. Low-sodium crackers.  Vegetables Fresh or frozen vegetables (raw, steamed, roasted, or grilled). Low-sodium or reduced-sodium tomato and vegetable juices. Low-sodium or reduced-sodium tomato sauce and paste. Low-sodium or reduced-sodium canned vegetables.   Fruits All fresh, canned (in natural juice), or frozen fruits.  Meat and Other Protein Products Ground beef (85% or leaner), grass-fed beef, or beef trimmed of fat. Skinless chicken or [place]. Ground chicken or [place]. Pork trimmed of fat. All fish and seafood. Eggs. Dried beans, peas, or lentils. Unsalted nuts and seeds. Unsalted canned beans.  Dairy Low-fat dairy products, such as skim or 1% milk, 2% or reduced-fat cheeses, low-fat ricotta or cottage cheese, or plain low-fat yogurt. Low-sodium or reduced-sodium cheeses.  Fats and Oils Tub margarines without trans fats. Light or reduced-fat mayonnaise and salad dressings (reduced sodium). Avocado. Safflower, olive, or canola oils. Natural peanut or almond butter.  Other Unsalted popcorn and pretzels. The items listed above may not be a complete list of recommended foods or beverages. Contact your dietitian for more options.  WHAT FOODS ARE NOT RECOMMENDED?  Grains White bread. White pasta. White rice. Refined cornbread. Bagels and croissants. Crackers that contain trans fat.  Vegetables Creamed or fried vegetables. Vegetables in a cheese sauce. Regular canned vegetables. Regular canned tomato sauce and paste. Regular tomato and vegetable juices.  Fruits Dried fruits. Canned fruit in light or heavy syrup. Fruit juice.  Meat and Other Protein Products Fatty cuts of meat. Ribs,  chicken wings, bacon, sausage, bologna, salami, chitterlings, fatback, hot dogs, bratwurst, and packaged luncheon meats. Salted nuts and seeds. Canned  beans with salt.  Dairy Whole or 2% milk, cream, half-and-half, and cream cheese. Whole-fat or sweetened yogurt. Full-fat cheeses or blue cheese. Nondairy creamers and whipped toppings. Processed cheese, cheese spreads, or cheese curds.  Condiments Onion and garlic salt, seasoned salt, table salt, and sea salt. Canned and packaged gravies. Worcestershire sauce. Tartar sauce. Barbecue sauce. Teriyaki sauce. Soy sauce, including reduced sodium. Steak sauce. Fish sauce. Oyster sauce. Cocktail sauce. Horseradish. Ketchup and mustard. Meat flavorings and tenderizers. Bouillon cubes. Hot sauce. Tabasco sauce. Marinades. Taco seasonings. Relishes.  Fats and Oils Butter, stick margarine, lard, shortening, ghee, and bacon fat. Coconut, palm kernel, or palm oils. Regular salad dressings.  Other Pickles and olives. Salted popcorn and pretzels.  The items listed above may not be a complete list of foods and beverages to avoid. Contact your dietitian for more information.  WHERE CAN I FIND MORE INFORMATION? National [employer], [employer], [employer] Institute: [email or web address] Document Released: 01/31/2011 Document Revised: 06/28/2013 Document Reviewed: 12/16/2012 [facility] Patient Information 2015 [place], [place]. This information is not intended to replace advice given to you by your health care provider. Make sure you discuss any questions you have with your health care provider.   I think that you would greatly benefit from seeing a nutritionist.  If you are interested, please call Dr. Wonda at [phone] to schedule an appointment.

## 2023-12-10 ENCOUNTER — Ambulatory Visit: Payer: Self-pay | Admitting: Family Medicine

## 2023-12-10 LAB — BMP8+EGFR
BUN/Creatinine Ratio: 13 (ref 9–23)
BUN: 11 mg/dL (ref 6–24)
CO2: 20 mmol/L (ref 20–29)
Calcium: 8.8 mg/dL (ref 8.7–10.2)
Chloride: 103 mmol/L (ref 96–106)
Creatinine, Ser: 0.88 mg/dL (ref 0.57–1.00)
Glucose: 84 mg/dL (ref 70–99)
Potassium: 4.2 mmol/L (ref 3.5–5.2)
Sodium: 138 mmol/L (ref 134–144)
eGFR: 82 mL/min/1.73 (ref >59)

## 2023-12-12 ENCOUNTER — Inpatient Hospital Stay

## 2023-12-12 VITALS — BP 161/90 | HR 81 | Temp 97.4°F | Resp 18

## 2023-12-12 DIAGNOSIS — D509 Iron deficiency anemia, unspecified: Secondary | ICD-10-CM | POA: Diagnosis not present

## 2023-12-12 MED ORDER — SODIUM CHLORIDE 0.9 % IV SOLN: INTRAVENOUS | Status: DC

## 2023-12-12 MED ORDER — FAMOTIDINE IN NACL 20-0.9 MG/50ML-% IV SOLN
20.0000 mg | Freq: Once | INTRAVENOUS | Status: AC
  Administered 2023-12-12: 20 mg via INTRAVENOUS
  Filled 2023-12-12: qty 50

## 2023-12-12 MED ORDER — SODIUM CHLORIDE 0.9 % IV SOLN
510.0000 mg | Freq: Once | INTRAVENOUS | Status: AC
  Administered 2023-12-12: 510 mg via INTRAVENOUS
  Filled 2023-12-12: qty 510

## 2023-12-12 MED ORDER — ACETAMINOPHEN 325 MG PO TABS
650.0000 mg | ORAL_TABLET | Freq: Once | ORAL | Status: AC
  Administered 2023-12-12: 650 mg via ORAL
  Filled 2023-12-12: qty 2

## 2023-12-12 MED ORDER — CETIRIZINE HCL 10 MG/ML IV SOLN
10.0000 mg | Freq: Once | INTRAVENOUS | Status: AC
  Administered 2023-12-12: 10 mg via INTRAVENOUS
  Filled 2023-12-12: qty 1

## 2023-12-12 NOTE — Patient Instructions (Signed)

## 2023-12-12 NOTE — Progress Notes (Signed)
Stable during infusion without adverse affects.  Vital signs stable.  No complaints at this time.  Discharge from clinic ambulatory in stable condition.  Alert and oriented X 3.  Follow up with [facility] as scheduled.

## 2023-12-15 ENCOUNTER — Ambulatory Visit (INDEPENDENT_AMBULATORY_CARE_PROVIDER_SITE_OTHER): Admitting: Gastroenterology

## 2023-12-15 ENCOUNTER — Telehealth (INDEPENDENT_AMBULATORY_CARE_PROVIDER_SITE_OTHER): Payer: Self-pay

## 2023-12-15 ENCOUNTER — Encounter (INDEPENDENT_AMBULATORY_CARE_PROVIDER_SITE_OTHER): Payer: Self-pay | Admitting: Gastroenterology

## 2023-12-15 VITALS — BP 163/95 | HR 80 | Temp 97.1°F | Ht 70.0 in | Wt 330.1 lb

## 2023-12-15 DIAGNOSIS — D509 Iron deficiency anemia, unspecified: Secondary | ICD-10-CM | POA: Diagnosis not present

## 2023-12-15 MED ORDER — PEG 3350-KCL-NA BICARB-NACL 420 G PO SOLR: 4000.0000 mL | Freq: Once | ORAL | 0 refills | Status: AC

## 2023-12-15 NOTE — Patient Instructions (Signed)
 Schedule EGD and colonoscopy If negative endoscopic investigations, we will proceed with a capsule endoscopy Continue IV iron supplementation per hematology recommendations, close follow-up with hematology

## 2023-12-15 NOTE — Progress Notes (Signed)
 Toribio Fortune, M.D. Gastroenterology & Hepatology [facility] [address] [place], [place] [address] Primary Care Physician: Severa Rock HERO, FNP [address] [place] [address]  Referring MD: Mackey Chad, MD  Chief Complaint:  iron deficiency anemia  History of Present Illness: Vanessa [name] is a 48 y.o. female with past medical history of alpha gal, hypertension, sleep apnea, pseudotumor of orbit with vision loss, who presents for evaluation of iron deficiency anemia.  Patient has been seen by hematology (Dr. Chad) for iron-deficiency anemia.  She was started on IV iron infusion, with last dose on 12/12/2023. She is supposed to get IV iron again soon. Most recent blood workup from 11/28/2023 showed saturation of 8%, iron 32, TIBC 395, ferritin 32, hemoglobin 10.9, MCV 74, platelets 233 and WBC 7.8.  Notably, the patient has had hemoglobin less than 12 since February 2022.  Her hemoglobin has fluctuated between 9-10 range. Has never been on oral iron.  Patient reports she may have seen a couple of times some melena last month but this is not happening on a regular basis. Feels tired during the da but no SOB or lightheadedness. The patient denies having any nausea, vomiting, fever, chills, hematochezia, hematemesis, abdominal distention, abdominal pain, diarrhea, jaundice, pruritus or weight loss.  Patient denies any frequent vaginal bleeding.  Has not presented bleeding from any other orifice that she can notice.  Does not take NSAIDs or AC, only takes Tylenol  for pain.  Takes oxycodone  possibly once a day, may be less than that.  Last ZHI:wzczm Last Colonoscopy:never  FHx: neg for any gastrointestinal/liver disease, no malignancies Social: neg smoking, alcohol or illicit drug use Surgical: VP shunt  Past Medical 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   Hypertension     Microcytic anemia 11/28/2023   Migraines    Pseudotumor (inflammatory) of orbit    Sepsis (HCC) 02/2019   Sleep apnea    Vaginal Pap smear, abnormal    Vision loss     Past Surgical History: Past Surgical History:  Procedure Laterality Date   arm surgery Left 08/20/2022   BRAIN SURGERY     CSF SHUNT      Family History: Family History  Problem Relation Age of Onset   Pulmonary Hypertension Mother    Congestive Heart Failure Mother    Hypertension Sister    Obesity Sister    Diabetes Maternal Grandmother    Breast cancer Cousin    Migraines Neg Hx     Social History: Social History   Tobacco Use  Smoking Status Never  Smokeless Tobacco Never   Social History   Substance and Sexual Activity  Alcohol Use No   Social History   Substance and Sexual Activity  Drug Use No    Allergies: Allergies  Allergen Reactions   Iodinated Contrast Media Anaphylaxis, Cough, Itching, Other (See Comments), Photosensitivity, Shortness Of Breath and Swelling    Migraine instantly  update, Migraine instantly  update    update update update  update Migraine instantly    update Migraine instantly update Migraine instantly  Migraine instantly    Migraine instantly  update, Migraine instantly  update  update update update  update Migraine instantly  update Migraine instantly update Migraine instantly    update, Migraine instantly    update Migraine instantly  Topical OK per patient  Providone-Iodine 10% Solution used for skin prep   Metrizamide Other (  See Comments), Itching, Photosensitivity, Shortness Of Breath and Swelling    update Migraine instantly   Mushroom Extract Complex (Obsolete) Itching    Throat itching with cough   Other Itching    Throat itching with cough   Peanut Oil Anaphylaxis and Other (See Comments)    update   Peanut-Containing Drug Products Anaphylaxis, Itching and Other (See Comments)    Itching throat with cough  update  Itching throat  with cough    Itching throat with cough  update    update   Diflucan  In Dextrose [Fluconazole  In Dextrose] Hives   Iodine-131 Other (See Comments)    update    Medications: Current Outpatient Medications  Medication Sig Dispense Refill   amitriptyline  (ELAVIL ) 50 MG tablet Take 1 tablet (50 mg total) by mouth daily. 90 tablet 1   atorvastatin  (LIPITOR) 80 MG tablet Take 1 tablet (80 mg total) by mouth daily. 90 tablet 1   Blood Glucose Monitoring Suppl (ONE TOUCH ULTRA 2) w/Device KIT Check BS 3 times a day Dx E11.65 1 kit 0   Continuous Glucose Sensor (FREESTYLE LIBRE 3 SENSOR) MISC Place 1 sensor on the skin every 14 days. Use to check glucose continuously 2 each 10   cyclobenzaprine  (FLEXERIL ) 10 MG tablet TAKE 1 TABLET BY MOUTH THREE TIMES A DAY AS NEEDED FOR MUSCLE SPASMS 60 tablet 2   empagliflozin  (JARDIANCE ) 25 MG TABS tablet Take 1 tablet (25 mg total) by mouth daily before breakfast. 30 tablet 0   EPINEPHrine  (EPIPEN  2-PAK) 0.3 mg/0.3 mL IJ SOAJ injection Inject 0.3 mg into the muscle as needed for anaphylaxis. 2 each 1   furosemide  (LASIX ) 40 MG tablet Take 1 tablet (40 mg total) by mouth daily. 90 tablet 1   glucose blood (ONETOUCH ULTRA) test strip DX:E11.65 100 strip 0   hydrOXYzine  (VISTARIL ) 25 MG capsule Take 1-2 capsules (25-50 mg total) by mouth at bedtime and may repeat dose one time if needed. 180 capsule 1   Lancets (ONETOUCH DELICA PLUS LANCET33G) MISC Check BS 3 times a day Dx E11.65 300 each 3   losartan (COZAAR) 100 MG tablet Take 1 tablet (100 mg total) by mouth daily. 90 tablet 3   metoprolol  succinate (TOPROL -XL) 25 MG 24 hr tablet Take 1 tablet (25 mg total) by mouth daily. 90 tablet 1   montelukast  (SINGULAIR ) 10 MG tablet Take 1 tablet (10 mg total) by mouth at bedtime. 90 tablet 1   Oxycodone  HCl 10 MG TABS Take 1 tablet (10 mg total) by mouth 2 (two) times daily as needed. 60 tablet 0   Oxycodone  HCl 10 MG TABS Take 1 tablet (10 mg total) by mouth 2 (two)  times daily as needed. 60 tablet 0   Oxycodone  HCl 10 MG TABS Take 1 tablet (10 mg total) by mouth 2 (two) times daily as needed. 60 tablet 0   promethazine -dextromethorphan  (PROMETHAZINE -DM) 6.25-15 MG/5ML syrup Take 5 mLs by mouth 4 (four) times daily as needed for cough. 118 mL 0   Rimegepant Sulfate (NURTEC) 75 MG TBDP Take 75 mg by mouth daily as needed. Onset migraine (max one daily) 10 tablet 11   Semaglutide , 2 MG/DOSE, (OZEMPIC , 2 MG/DOSE,) 8 MG/3ML SOPN INJECT 2MG  SUBCUTANEOUSLY AS DIRECTED ONCE A WEEK 9 mL 0   spironolactone  (ALDACTONE ) 100 MG tablet Take 1 tablet (100 mg total) by mouth 2 (two) times daily. 180 tablet 1   dicyclomine (BENTYL) 20 MG tablet Take 20 mg by mouth 2 (two) times daily. (  Patient not taking: Reported on 12/15/2023)     No current facility-administered medications for this visit.    Review of Systems: GENERAL: negative for malaise, night sweats HEENT: No changes in hearing or vision, no nose bleeds or other nasal problems. NECK: Negative for lumps, goiter, pain and significant neck swelling RESPIRATORY: Negative for cough, wheezing CARDIOVASCULAR: Negative for chest pain, leg swelling, palpitations, orthopnea GI: SEE HPI MUSCULOSKELETAL: Negative for joint pain or swelling, back pain, and muscle pain. SKIN: Negative for lesions, rash PSYCH: Negative for sleep disturbance, mood disorder and recent psychosocial stressors. HEMATOLOGY Negative for prolonged bleeding, bruising easily, and swollen nodes. ENDOCRINE: Negative for cold or heat intolerance, polyuria, polydipsia and goiter. NEURO: negative for tremor, gait imbalance, syncope and seizures. The remainder of the review of systems is noncontributory.   Physical Exam: BP (!) 163/95 (BP Location: Left Arm, Patient Position: Sitting, Cuff Size: Large)   Pulse 80   Temp (!) 97.1 F (36.2 C) (Temporal)   Ht 5' 10 (1.778 m)   Wt (!) 330 lb 1.6 oz (149.7 kg)   BMI 47.36 kg/m  GENERAL: The patient is  AO x3, in no acute distress.  Obese HEENT: Head is normocephalic and atraumatic. EOMI are intact. Mouth is well hydrated and without lesions. NECK: Supple. No masses LUNGS: Clear to auscultation. No presence of rhonchi/wheezing/rales. Adequate chest expansion HEART: RRR, normal s1 and s2. ABDOMEN: Soft, nontender, no guarding, no peritoneal signs, and nondistended. BS +. No masses. EXTREMITIES: Without any cyanosis, clubbing, rash, lesions or edema. NEUROLOGIC: AOx3, no focal motor deficit. SKIN: no jaundice, no rashes   Imaging/Labs: as above  I personally reviewed and interpreted the available labs, imaging and endoscopic files.  Impression and Plan: Vanessa [name] is a 48 y.o. female with past medical history of alpha gal, hypertension, sleep apnea, pseudotumor of orbit with vision loss, who presents for evaluation of iron deficiency anemia.  Patient has presented worsening iron deficiency anemia without requirement of blood transfusion.Has not presented any overt gastrointestinal bleeding symptom.  We discussed in detail that this should be ideally evaluated with bidirectional endoscopy, which she is agreeable to proceed with to rule out etiology such as peptic ulcer disease, H. pylori, AVMs or malignancy.  If these investigations are negative, we will need to proceed with a capsule endoscopy.  Patient should continue close follow-up with hematology for now and continue with the scheduled IV iron infusions.  -Schedule EGD and colonoscopy -If negative endoscopic investigations, we will proceed with a capsule endoscopy -Continue IV iron supplementation per hematology recommendations, close follow-up with hematology  All questions were answered.      Toribio Fortune, MD Gastroenterology and Hepatology [facility]

## 2023-12-15 NOTE — Telephone Encounter (Signed)
 Spoke with patient, scheduled EGD/TCS for 01/20/2024 at 8:15am. Rx sent to pharmacy. Instructions sent to mychart.

## 2023-12-16 NOTE — Telephone Encounter (Signed)
 PA on [facility] for TCS/EGD:  Notification or Prior Authorization is not required for the requested services You are not required to submit a notification/prior authorization based on the information provided.  Decision ID #: [id number]

## 2023-12-19 ENCOUNTER — Inpatient Hospital Stay

## 2023-12-19 VITALS — BP 143/78 | HR 78 | Temp 97.0°F | Resp 20

## 2023-12-19 DIAGNOSIS — D509 Iron deficiency anemia, unspecified: Secondary | ICD-10-CM | POA: Diagnosis not present

## 2023-12-19 MED ORDER — ACETAMINOPHEN 325 MG PO TABS
650.0000 mg | ORAL_TABLET | Freq: Once | ORAL | Status: AC
  Administered 2023-12-19: 650 mg via ORAL
  Filled 2023-12-19: qty 2

## 2023-12-19 MED ORDER — CETIRIZINE HCL 10 MG/ML IV SOLN
10.0000 mg | Freq: Once | INTRAVENOUS | Status: AC
  Administered 2023-12-19: 10 mg via INTRAVENOUS
  Filled 2023-12-19: qty 1

## 2023-12-19 MED ORDER — ONDANSETRON HCL 4 MG/2ML IJ SOLN
8.0000 mg | Freq: Once | INTRAMUSCULAR | Status: AC
  Administered 2023-12-19: 8 mg via INTRAVENOUS
  Filled 2023-12-19: qty 4

## 2023-12-19 MED ORDER — FAMOTIDINE IN NACL 20-0.9 MG/50ML-% IV SOLN
20.0000 mg | Freq: Once | INTRAVENOUS | Status: AC
  Administered 2023-12-19: 20 mg via INTRAVENOUS
  Filled 2023-12-19: qty 50

## 2023-12-19 MED ORDER — SODIUM CHLORIDE 0.9 % IV SOLN: INTRAVENOUS | Status: DC

## 2023-12-19 MED ORDER — SODIUM CHLORIDE 0.9 % IV SOLN
510.0000 mg | Freq: Once | INTRAVENOUS | Status: AC
  Administered 2023-12-19: 510 mg via INTRAVENOUS
  Filled 2023-12-19: qty 510

## 2023-12-19 NOTE — Patient Instructions (Signed)

## 2023-12-19 NOTE — Progress Notes (Signed)
 Per pt she she was nauseous during and slightly after her last iron infusion last week. Treatment team made aware and adjustments to premedications were made. Pt updated and all questions answered at this time.   Patient tolerated iron infusion with no complaints voiced.  Peripheral IV site clean and dry with good blood return noted before and after infusion.  Band aid applied. Pt observed for 30 minutes post iron without any complications. VSS with discharge and left in satisfactory condition with no s/s of distress noted. All follow ups as scheduled.   Harrol Novello

## 2023-12-29 ENCOUNTER — Other Ambulatory Visit: Payer: Self-pay | Admitting: *Deleted

## 2023-12-29 DIAGNOSIS — E1165 Type 2 diabetes mellitus with hyperglycemia: Secondary | ICD-10-CM

## 2024-01-15 ENCOUNTER — Encounter (HOSPITAL_COMMUNITY)
Admission: RE | Admit: 2024-01-15 | Discharge: 2024-01-15 | Disposition: A | Discharge status: Home / Self Care | Source: Ambulatory Visit | Attending: Gastroenterology | Admitting: Gastroenterology

## 2024-01-15 ENCOUNTER — Encounter (HOSPITAL_COMMUNITY): Payer: Self-pay

## 2024-01-15 ENCOUNTER — Other Ambulatory Visit: Payer: Self-pay

## 2024-01-15 VITALS — Ht 70.0 in | Wt 333.0 lb

## 2024-01-15 DIAGNOSIS — Z01818 Encounter for other preprocedural examination: Secondary | ICD-10-CM

## 2024-01-20 ENCOUNTER — Telehealth (INDEPENDENT_AMBULATORY_CARE_PROVIDER_SITE_OTHER): Payer: Self-pay

## 2024-01-20 ENCOUNTER — Encounter (HOSPITAL_COMMUNITY): Payer: Self-pay | Admitting: Gastroenterology

## 2024-01-20 ENCOUNTER — Ambulatory Visit (HOSPITAL_COMMUNITY): Admitting: Certified Registered"

## 2024-01-20 ENCOUNTER — Encounter (HOSPITAL_COMMUNITY)
Admission: RE | Disposition: A | Discharge status: Home / Self Care | Payer: Self-pay | Source: Home / Self Care | Attending: Gastroenterology

## 2024-01-20 ENCOUNTER — Other Ambulatory Visit: Payer: Self-pay

## 2024-01-20 ENCOUNTER — Ambulatory Visit (HOSPITAL_COMMUNITY)
Admission: RE | Admit: 2024-01-20 | Discharge: 2024-01-20 | Disposition: A | Discharge status: Home / Self Care | Attending: Gastroenterology | Admitting: Gastroenterology

## 2024-01-20 DIAGNOSIS — E119 Type 2 diabetes mellitus without complications: Secondary | ICD-10-CM

## 2024-01-20 DIAGNOSIS — Z833 Family history of diabetes mellitus: Secondary | ICD-10-CM | POA: Insufficient documentation

## 2024-01-20 DIAGNOSIS — D509 Iron deficiency anemia, unspecified: Secondary | ICD-10-CM | POA: Diagnosis present

## 2024-01-20 DIAGNOSIS — Z6841 Body Mass Index (BMI) 40.0 and over, adult: Secondary | ICD-10-CM | POA: Insufficient documentation

## 2024-01-20 DIAGNOSIS — G4730 Sleep apnea, unspecified: Secondary | ICD-10-CM | POA: Insufficient documentation

## 2024-01-20 DIAGNOSIS — I10 Essential (primary) hypertension: Secondary | ICD-10-CM

## 2024-01-20 DIAGNOSIS — K648 Other hemorrhoids: Secondary | ICD-10-CM | POA: Diagnosis not present

## 2024-01-20 DIAGNOSIS — Z7984 Long term (current) use of oral hypoglycemic drugs: Secondary | ICD-10-CM | POA: Insufficient documentation

## 2024-01-20 DIAGNOSIS — Z8249 Family history of ischemic heart disease and other diseases of the circulatory system: Secondary | ICD-10-CM | POA: Diagnosis not present

## 2024-01-20 DIAGNOSIS — K317 Polyp of stomach and duodenum: Secondary | ICD-10-CM | POA: Insufficient documentation

## 2024-01-20 DIAGNOSIS — K2980 Duodenitis without bleeding: Secondary | ICD-10-CM | POA: Diagnosis not present

## 2024-01-20 DIAGNOSIS — E6689 Other obesity not elsewhere classified: Secondary | ICD-10-CM | POA: Insufficient documentation

## 2024-01-20 DIAGNOSIS — Z8349 Family history of other endocrine, nutritional and metabolic diseases: Secondary | ICD-10-CM | POA: Diagnosis not present

## 2024-01-20 DIAGNOSIS — K649 Unspecified hemorrhoids: Secondary | ICD-10-CM

## 2024-01-20 DIAGNOSIS — K635 Polyp of colon: Secondary | ICD-10-CM

## 2024-01-20 DIAGNOSIS — D122 Benign neoplasm of ascending colon: Secondary | ICD-10-CM | POA: Insufficient documentation

## 2024-01-20 DIAGNOSIS — Z01818 Encounter for other preprocedural examination: Secondary | ICD-10-CM

## 2024-01-20 HISTORY — PX: ESOPHAGOGASTRODUODENOSCOPY: SHX5428

## 2024-01-20 HISTORY — PX: COLONOSCOPY: SHX5424

## 2024-01-20 LAB — HM COLONOSCOPY

## 2024-01-20 LAB — POCT PREGNANCY, URINE: Preg Test, Ur: NEGATIVE

## 2024-01-20 LAB — GLUCOSE, CAPILLARY: Glucose-Capillary: 111 mg/dL — ABNORMAL HIGH (ref 70–99)

## 2024-01-20 SURGERY — COLONOSCOPY
Anesthesia: Monitor Anesthesia Care

## 2024-01-20 MED ORDER — DEXMEDETOMIDINE HCL IN NACL 80 MCG/20ML IV SOLN
INTRAVENOUS | Status: DC | PRN
  Administered 2024-01-20: 8 ug via INTRAVENOUS

## 2024-01-20 MED ORDER — GLYCOPYRROLATE PF 0.2 MG/ML IJ SOSY
PREFILLED_SYRINGE | INTRAMUSCULAR | Status: DC | PRN
  Administered 2024-01-20 (×2): .1 mg via INTRAVENOUS

## 2024-01-20 MED ORDER — LIDOCAINE 2% (20 MG/ML) 5 ML SYRINGE
INTRAMUSCULAR | Status: DC | PRN
  Administered 2024-01-20: 40 mg via INTRAVENOUS
  Administered 2024-01-20: 60 mg via INTRAVENOUS

## 2024-01-20 MED ORDER — PROPOFOL 10 MG/ML IV BOLUS
INTRAVENOUS | Status: DC | PRN
  Administered 2024-01-20: 70 mg via INTRAVENOUS
  Administered 2024-01-20: 50 mg via INTRAVENOUS
  Administered 2024-01-20: 30 mg via INTRAVENOUS
  Administered 2024-01-20: 125 ug/kg/min via INTRAVENOUS

## 2024-01-20 MED ORDER — OMEPRAZOLE 40 MG PO CPDR
40.0000 mg | DELAYED_RELEASE_CAPSULE | Freq: Every day | ORAL | 3 refills | Status: AC
Start: 2024-01-20 — End: ?

## 2024-01-20 MED ORDER — LACTATED RINGERS IV SOLN: INTRAVENOUS | Status: DC | PRN

## 2024-01-20 NOTE — H&P (Signed)
 Vanessa [name] is an 48 y.o. female.   Chief Complaint: iron deficiency anemia HPI: Vanessa [name] is a 48 y.o. female with past medical history of alpha gal, hypertension, sleep apnea, pseudotumor of orbit with vision loss, who presents for evaluation of iron deficiency anemia.   The patient denies having any nausea, vomiting, fever, chills, hematochezia, melena, hematemesis, abdominal distention, abdominal pain, diarrhea, jaundice, pruritus or weight loss.   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   Hypertension    Microcytic anemia 11/28/2023   Migraines    Pseudotumor (inflammatory) of orbit    Sepsis (HCC) 02/2019   Sleep apnea    Vaginal Pap smear, abnormal    Vision loss     Past Surgical History:  Procedure Laterality Date   arm surgery Left 08/20/2022   BRAIN SURGERY     shunt on right with removal and reinserted on left   CSF SHUNT     lower back   TRACHEOSTOMY      Family History  Problem Relation Age of Onset   Pulmonary Hypertension Mother    Congestive Heart Failure Mother    Hypertension Sister    Obesity Sister    Diabetes Maternal Grandmother    Breast cancer Cousin    Migraines Neg Hx    Social History:  reports that she has never smoked. She has never used smokeless tobacco. She reports that she does not drink alcohol and does not use drugs.  Allergies:  Allergies  Allergen Reactions   Iodinated Contrast Media Anaphylaxis, Cough, Itching, Other (See Comments), Photosensitivity, Shortness Of Breath and Swelling    Migraine instantly  update, Migraine instantly  update    update update update  update Migraine instantly    update Migraine instantly update Migraine instantly  Migraine instantly    Migraine instantly  update, Migraine instantly  update  update update update  update Migraine instantly  update Migraine instantly update Migraine instantly    update, Migraine instantly    update  Migraine instantly  Topical OK per patient  Providone-Iodine 10% Solution used for skin prep   Metrizamide Other (See Comments), Itching, Photosensitivity, Shortness Of Breath and Swelling    update Migraine instantly   Mushroom Extract Complex (Obsolete) Itching    Throat itching with cough   Other Itching    Throat itching with cough   Peanut Oil Anaphylaxis and Other (See Comments)    update   Peanut-Containing Drug Products Anaphylaxis, Itching and Other (See Comments)    Itching throat with cough  update  Itching throat with cough    Itching throat with cough  update    update   Diflucan  In Dextrose [Fluconazole  In Dextrose] Hives   Iodine-131 Other (See Comments)    update    Medications Prior to Admission  Medication Sig Dispense Refill   amitriptyline  (ELAVIL ) 50 MG tablet Take 1 tablet (50 mg total) by mouth daily. 90 tablet 1   atorvastatin  (LIPITOR) 80 MG tablet Take 1 tablet (80 mg total) by mouth daily. 90 tablet 1   cyclobenzaprine  (FLEXERIL ) 10 MG tablet TAKE 1 TABLET BY MOUTH THREE TIMES A DAY AS NEEDED FOR MUSCLE SPASMS 60 tablet 2   furosemide  (LASIX ) 40 MG tablet Take 1 tablet (40 mg total) by mouth daily. 90 tablet 1   hydrOXYzine  (VISTARIL ) 25 MG capsule Take 1-2 capsules (25-50 mg total) by mouth at bedtime and may repeat dose one time if  needed. 180 capsule 1   losartan  (COZAAR ) 100 MG tablet Take 1 tablet (100 mg total) by mouth daily. 90 tablet 3   metoprolol  succinate (TOPROL -XL) 25 MG 24 hr tablet Take 1 tablet (25 mg total) by mouth daily. 90 tablet 1   montelukast  (SINGULAIR ) 10 MG tablet Take 1 tablet (10 mg total) by mouth at bedtime. 90 tablet 1   Oxycodone  HCl 10 MG TABS Take 1 tablet (10 mg total) by mouth 2 (two) times daily as needed. 60 tablet 0   Oxycodone  HCl 10 MG TABS Take 1 tablet (10 mg total) by mouth 2 (two) times daily as needed. 60 tablet 0   Oxycodone  HCl 10 MG TABS Take 1 tablet (10 mg total) by mouth 2 (two) times daily as  needed. 60 tablet 0   Rimegepant Sulfate (NURTEC) 75 MG TBDP Take 75 mg by mouth daily as needed. Onset migraine (max one daily) 10 tablet 11   Blood Glucose Monitoring Suppl (ONE TOUCH ULTRA 2) w/Device KIT Check BS 3 times a day Dx E11.65 1 kit 0   Continuous Glucose Sensor (FREESTYLE LIBRE 3 SENSOR) MISC Place 1 sensor on the skin every 14 days. Use to check glucose continuously 2 each 10   dicyclomine (BENTYL) 20 MG tablet Take 20 mg by mouth 2 (two) times daily. (Patient not taking: Reported on 12/15/2023)     empagliflozin  (JARDIANCE ) 25 MG TABS tablet Take 1 tablet (25 mg total) by mouth daily before breakfast. 30 tablet 0   EPINEPHrine  (EPIPEN  2-PAK) 0.3 mg/0.3 mL IJ SOAJ injection Inject 0.3 mg into the muscle as needed for anaphylaxis. 2 each 1   glucose blood (ACCU-CHEK GUIDE TEST) test strip Check BS 3 times a day Dx E11.65 300 strip 3   Lancets (ONETOUCH DELICA PLUS LANCET33G) MISC Check BS 3 times a day Dx E11.65 300 each 3   promethazine -dextromethorphan  (PROMETHAZINE -DM) 6.25-15 MG/5ML syrup Take 5 mLs by mouth 4 (four) times daily as needed for cough. 118 mL 0   Semaglutide , 2 MG/DOSE, (OZEMPIC , 2 MG/DOSE,) 8 MG/3ML SOPN INJECT 2MG  SUBCUTANEOUSLY AS DIRECTED ONCE A WEEK 9 mL 0   spironolactone  (ALDACTONE ) 100 MG tablet Take 1 tablet (100 mg total) by mouth 2 (two) times daily. 180 tablet 1    Results for orders placed or performed during the hospital encounter of 01/20/24 (from the past 48 hours)  Pregnancy, urine POC     Status: None   Collection Time: 01/20/24  7:07 AM  Result Value Ref Range   Preg Test, Ur NEGATIVE NEGATIVE    Comment:        THE SENSITIVITY OF THIS METHODOLOGY IS >20 mIU/mL.   Glucose, capillary     Status: Abnormal   Collection Time: 01/20/24  7:13 AM  Result Value Ref Range   Glucose-Capillary 111 (H) 70 - 99 mg/dL    Comment: Glucose reference range applies only to samples taken after fasting for at least 8 hours.   No results found.  Review of  Systems  All other systems reviewed and are negative.   Blood pressure (!) 154/87, pulse 75, temperature 98 F (36.7 C), temperature source Oral, resp. rate 10, height 5' 10 (1.778 m), weight (!) 149.7 kg, last menstrual period 12/29/2023, SpO2 100%. Physical Exam  GENERAL: The patient is AO x3, in no acute distress. HEENT: Head is normocephalic and atraumatic. EOMI are intact. Mouth is well hydrated and without lesions. NECK: Supple. No masses LUNGS: Clear to auscultation. No presence of  rhonchi/wheezing/rales. Adequate chest expansion HEART: RRR, normal s1 and s2. ABDOMEN: Soft, nontender, no guarding, no peritoneal signs, and nondistended. BS +. No masses. EXTREMITIES: Without any cyanosis, clubbing, rash, lesions or edema. NEUROLOGIC: AOx3, no focal motor deficit. SKIN: no jaundice, no rashes  Assessment/Plan Larena Ohnemus is a 48 y.o. female with past medical history of alpha gal, hypertension, sleep apnea, pseudotumor of orbit with vision loss, who presents for evaluation of iron deficiency anemia.  Will proceed with EGD and colonoscopy.  Toribio Eartha Flavors, MD 01/20/2024, 8:22 AM

## 2024-01-20 NOTE — Op Note (Addendum)
 [facility] Patient Name: Vanessa [name] Procedure Date: 01/20/2024 8:22 AM MRN: [id number] Date of Birth: [birth year] Attending MD: Toribio Fortune , , [id number] CSN: [id number] Age: 48 Admit Type: Outpatient Procedure:                Upper GI endoscopy Indications:              Iron deficiency anemia Providers:                Toribio Fortune, Jon LABOR. Gerome RN, RN, Dorcas Lenis, Technician Referring MD:              Medicines:                Monitored Anesthesia Care Complications:            No immediate complications. Estimated Blood Loss:     Estimated blood loss: none. Procedure:                Pre-Anesthesia Assessment:                           - Prior to the procedure, a History and Physical                            was performed, and patient medications, allergies                            and sensitivities were reviewed. The patient's                            tolerance of previous anesthesia was reviewed.                           - The risks and benefits of the procedure and the                            sedation options and risks were discussed with the                            patient. All questions were answered and informed                            consent was obtained.                           - ASA Grade Assessment: II - A patient with mild                            systemic disease.                           After obtaining informed consent, the endoscope was                            passed under direct vision. Throughout the  procedure, the patient's blood pressure, pulse, and                            oxygen saturations were monitored continuously. The                            Endoscope was introduced through the mouth, and                            advanced to the second part of duodenum. The upper                            GI endoscopy was accomplished without difficulty.                             The patient tolerated the procedure well. Scope In: 8:35:01 AM Scope Out: 8:47:32 AM Total Procedure Duration: 0 hours 12 minutes 31 seconds  Findings:      The examined esophagus was normal.      Multiple small sessile polyps with no bleeding and stigmata of recent       bleeding (x2) were found in the gastric fundus and in the gastric body.       Two polyps were removed with a cold snare. Resection and retrieval were       complete.      The gastric antrum was normal. Biopsies were taken with a cold forceps       for Helicobacter pylori testing.      Diffuse mild inflammation characterized by erythema was found in the       duodenal bulb. Biopsies were taken with a cold forceps for histology. Impression:               - Normal esophagus.                           - Multiple gastric polyps. Resected and retrieved.                           - Normal antrum. Biopsied.                           - Duodenitis. Biopsied. Moderate Sedation:      Per Anesthesia Care Recommendation:           - Discharge patient to home (ambulatory).                           - Resume previous diet.                           - Await pathology results.                           - Use Prilosec (omeprazole ) 40 mg PO daily. Procedure Code(s):        --- Professional ---                           [phone], Esophagogastroduodenoscopy, flexible,  transoral; with removal of tumor(s), polyp(s), or                            other lesion(s) by snare technique                           43239, 59, Esophagogastroduodenoscopy, flexible,                            transoral; with biopsy, single or multiple Diagnosis Code(s):        --- Professional ---                           K31.7, Polyp of stomach and duodenum                           K29.80, Duodenitis without bleeding                           D50.9, Iron deficiency anemia, unspecified CPT copyright 2022 American Medical  Association. All rights reserved. The codes documented in this report are preliminary and upon coder review may  be revised to meet current compliance requirements. Toribio Fortune, MD Toribio Fortune,  01/20/2024 8:50:36 AM This report has been signed electronically. Number of Addenda: 0

## 2024-01-20 NOTE — H&P (View-Only) (Signed)
 Vanessa [name] is an 48 y.o. female.   Chief Complaint: iron deficiency anemia HPI: Vanessa [name] is a 48 y.o. female with past medical history of alpha gal, hypertension, sleep apnea, pseudotumor of orbit with vision loss, who presents for evaluation of iron deficiency anemia.   The patient denies having any nausea, vomiting, fever, chills, hematochezia, melena, hematemesis, abdominal distention, abdominal pain, diarrhea, jaundice, pruritus or weight loss.   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   Hypertension    Microcytic anemia 11/28/2023   Migraines    Pseudotumor (inflammatory) of orbit    Sepsis (HCC) 02/2019   Sleep apnea    Vaginal Pap smear, abnormal    Vision loss     Past Surgical History:  Procedure Laterality Date   arm surgery Left 08/20/2022   BRAIN SURGERY     shunt on right with removal and reinserted on left   CSF SHUNT     lower back   TRACHEOSTOMY      Family History  Problem Relation Age of Onset   Pulmonary Hypertension Mother    Congestive Heart Failure Mother    Hypertension Sister    Obesity Sister    Diabetes Maternal Grandmother    Breast cancer Cousin    Migraines Neg Hx    Social History:  reports that she has never smoked. She has never used smokeless tobacco. She reports that she does not drink alcohol and does not use drugs.  Allergies:  Allergies  Allergen Reactions   Iodinated Contrast Media Anaphylaxis, Cough, Itching, Other (See Comments), Photosensitivity, Shortness Of Breath and Swelling    Migraine instantly  update, Migraine instantly  update    update update update  update Migraine instantly    update Migraine instantly update Migraine instantly  Migraine instantly    Migraine instantly  update, Migraine instantly  update  update update update  update Migraine instantly  update Migraine instantly update Migraine instantly    update, Migraine instantly    update  Migraine instantly  Topical OK per patient  Providone-Iodine 10% Solution used for skin prep   Metrizamide Other (See Comments), Itching, Photosensitivity, Shortness Of Breath and Swelling    update Migraine instantly   Mushroom Extract Complex (Obsolete) Itching    Throat itching with cough   Other Itching    Throat itching with cough   Peanut Oil Anaphylaxis and Other (See Comments)    update   Peanut-Containing Drug Products Anaphylaxis, Itching and Other (See Comments)    Itching throat with cough  update  Itching throat with cough    Itching throat with cough  update    update   Diflucan  In Dextrose [Fluconazole  In Dextrose] Hives   Iodine-131 Other (See Comments)    update    Medications Prior to Admission  Medication Sig Dispense Refill   amitriptyline  (ELAVIL ) 50 MG tablet Take 1 tablet (50 mg total) by mouth daily. 90 tablet 1   atorvastatin  (LIPITOR) 80 MG tablet Take 1 tablet (80 mg total) by mouth daily. 90 tablet 1   cyclobenzaprine  (FLEXERIL ) 10 MG tablet TAKE 1 TABLET BY MOUTH THREE TIMES A DAY AS NEEDED FOR MUSCLE SPASMS 60 tablet 2   furosemide  (LASIX ) 40 MG tablet Take 1 tablet (40 mg total) by mouth daily. 90 tablet 1   hydrOXYzine  (VISTARIL ) 25 MG capsule Take 1-2 capsules (25-50 mg total) by mouth at bedtime and may repeat dose one time if  needed. 180 capsule 1   losartan  (COZAAR ) 100 MG tablet Take 1 tablet (100 mg total) by mouth daily. 90 tablet 3   metoprolol  succinate (TOPROL -XL) 25 MG 24 hr tablet Take 1 tablet (25 mg total) by mouth daily. 90 tablet 1   montelukast  (SINGULAIR ) 10 MG tablet Take 1 tablet (10 mg total) by mouth at bedtime. 90 tablet 1   Oxycodone  HCl 10 MG TABS Take 1 tablet (10 mg total) by mouth 2 (two) times daily as needed. 60 tablet 0   Oxycodone  HCl 10 MG TABS Take 1 tablet (10 mg total) by mouth 2 (two) times daily as needed. 60 tablet 0   Oxycodone  HCl 10 MG TABS Take 1 tablet (10 mg total) by mouth 2 (two) times daily as  needed. 60 tablet 0   Rimegepant Sulfate (NURTEC) 75 MG TBDP Take 75 mg by mouth daily as needed. Onset migraine (max one daily) 10 tablet 11   Blood Glucose Monitoring Suppl (ONE TOUCH ULTRA 2) w/Device KIT Check BS 3 times a day Dx E11.65 1 kit 0   Continuous Glucose Sensor (FREESTYLE LIBRE 3 SENSOR) MISC Place 1 sensor on the skin every 14 days. Use to check glucose continuously 2 each 10   dicyclomine (BENTYL) 20 MG tablet Take 20 mg by mouth 2 (two) times daily. (Patient not taking: Reported on 12/15/2023)     empagliflozin  (JARDIANCE ) 25 MG TABS tablet Take 1 tablet (25 mg total) by mouth daily before breakfast. 30 tablet 0   EPINEPHrine  (EPIPEN  2-PAK) 0.3 mg/0.3 mL IJ SOAJ injection Inject 0.3 mg into the muscle as needed for anaphylaxis. 2 each 1   glucose blood (ACCU-CHEK GUIDE TEST) test strip Check BS 3 times a day Dx E11.65 300 strip 3   Lancets (ONETOUCH DELICA PLUS LANCET33G) MISC Check BS 3 times a day Dx E11.65 300 each 3   promethazine -dextromethorphan  (PROMETHAZINE -DM) 6.25-15 MG/5ML syrup Take 5 mLs by mouth 4 (four) times daily as needed for cough. 118 mL 0   Semaglutide , 2 MG/DOSE, (OZEMPIC , 2 MG/DOSE,) 8 MG/3ML SOPN INJECT 2MG  SUBCUTANEOUSLY AS DIRECTED ONCE A WEEK 9 mL 0   spironolactone  (ALDACTONE ) 100 MG tablet Take 1 tablet (100 mg total) by mouth 2 (two) times daily. 180 tablet 1    Results for orders placed or performed during the hospital encounter of 01/20/24 (from the past 48 hours)  Pregnancy, urine POC     Status: None   Collection Time: 01/20/24  7:07 AM  Result Value Ref Range   Preg Test, Ur NEGATIVE NEGATIVE    Comment:        THE SENSITIVITY OF THIS METHODOLOGY IS >20 mIU/mL.   Glucose, capillary     Status: Abnormal   Collection Time: 01/20/24  7:13 AM  Result Value Ref Range   Glucose-Capillary 111 (H) 70 - 99 mg/dL    Comment: Glucose reference range applies only to samples taken after fasting for at least 8 hours.   No results found.  Review of  Systems  All other systems reviewed and are negative.   Blood pressure (!) 154/87, pulse 75, temperature 98 F (36.7 C), temperature source Oral, resp. rate 10, height 5' 10 (1.778 m), weight (!) 149.7 kg, last menstrual period 12/29/2023, SpO2 100%. Physical Exam  GENERAL: The patient is AO x3, in no acute distress. HEENT: Head is normocephalic and atraumatic. EOMI are intact. Mouth is well hydrated and without lesions. NECK: Supple. No masses LUNGS: Clear to auscultation. No presence of  rhonchi/wheezing/rales. Adequate chest expansion HEART: RRR, normal s1 and s2. ABDOMEN: Soft, nontender, no guarding, no peritoneal signs, and nondistended. BS +. No masses. EXTREMITIES: Without any cyanosis, clubbing, rash, lesions or edema. NEUROLOGIC: AOx3, no focal motor deficit. SKIN: no jaundice, no rashes  Assessment/Plan Vanessa [name] is a 48 y.o. female with past medical history of alpha gal, hypertension, sleep apnea, pseudotumor of orbit with vision loss, who presents for evaluation of iron deficiency anemia.  Will proceed with EGD and colonoscopy.  Toribio Eartha Flavors, MD 01/20/2024, 8:22 AM

## 2024-01-20 NOTE — Anesthesia Procedure Notes (Signed)
 Date/Time: 01/20/2024 8:29 AM  Performed by: Para Jerelene CROME, CRNAOxygen Delivery Method: Nasal cannula Comments: OptiFlow Nasal Cannula.

## 2024-01-20 NOTE — Transfer of Care (Addendum)
 Immediate Anesthesia Transfer of Care Note  Patient: Vanessa [name]  Procedure(s) Performed: COLONOSCOPY EGD (ESOPHAGOGASTRODUODENOSCOPY)  Patient Location: Short Stay  Anesthesia Type:General  Level of Consciousness: drowsy and patient cooperative  Airway & Oxygen Therapy: Patient Spontanous Breathing and non-rebreather face mask  Post-op Assessment: Report given to RN and Post -op Vital signs reviewed and stable  Post vital signs: Reviewed and stable  Last Vitals:  Vitals Value Taken Time  BP 106/62 01/20/24   0916  Temp 36.2 01/20/24   0916  Pulse 87 01/20/24   0916  Resp 19 01/20/24   0916  SpO2 100% 01/20/24   0916    Last Pain:  Vitals:   01/20/24 0834  TempSrc:   PainSc: 6       Patients Stated Pain Goal: 5 (01/20/24 0715)  Complications: No notable events documented.

## 2024-01-20 NOTE — Telephone Encounter (Signed)
 Eartha Angelia Sieving, MD  Dallie Plowman S, [place]; [place], Mindy S, CMA Hi Charlsie Fleeger/Mindy/Tammy,  Can you please schedule a capsule endoscopy? Dx: iron deficiency anemia.  Thanks,  Sieving Eartha, MD Gastroenterology and Hepatology [facility]

## 2024-01-20 NOTE — Telephone Encounter (Signed)
 PA on [facility] for capsule endoscopy:  Notification or Prior Authorization is not required for the requested services You are not required to submit a notification/prior authorization based on the information provided.  Decision ID #: [id number]

## 2024-01-20 NOTE — Discharge Instructions (Addendum)
 You are being discharged to home.  Resume your previous diet.  We are waiting for your pathology results.  Take Prilosec (omeprazole ) 40 mg by mouth once a day.  Your physician has recommended a repeat colonoscopy for surveillance based on pathology results.  Schedule capsule endoscopy

## 2024-01-20 NOTE — Op Note (Signed)
 [facility] Patient Name: Vanessa [name] Procedure Date: 01/20/2024 8:21 AM MRN: [id number] Date of Birth: May 03, 1975 Attending MD: Toribio Fortune , , [id number] CSN: [id number] Age: 48 Admit Type: Outpatient Procedure:                Colonoscopy Indications:              Iron deficiency anemia Providers:                Toribio Fortune, Jon LABOR. Gerome RN, RN, Dorcas Lenis, Technician Referring MD:              Medicines:                Monitored Anesthesia Care Complications:            No immediate complications. Estimated Blood Loss:     Estimated blood loss: none. Procedure:                Pre-Anesthesia Assessment:                           - Prior to the procedure, a History and Physical                            was performed, and patient medications, allergies                            and sensitivities were reviewed. The patient's                            tolerance of previous anesthesia was reviewed.                           - The risks and benefits of the procedure and the                            sedation options and risks were discussed with the                            patient. All questions were answered and informed                            consent was obtained.                           - ASA Grade Assessment: II - A patient with mild                            systemic disease.                           After obtaining informed consent, the colonoscope                            was passed under direct vision. Throughout the  procedure, the patient's blood pressure, pulse, and                            oxygen saturations were monitored continuously. The                            [id number] ([id number]) Peds Colon was introduced                            through the anus and advanced to the the terminal                            ileum. The colonoscopy was performed without                             difficulty. The patient tolerated the procedure                            well. The quality of the bowel preparation was                            excellent. Scope In: 8:54:17 AM Scope Out: 9:08:31 AM Scope Withdrawal Time: 0 hours 10 minutes 54 seconds  Total Procedure Duration: 0 hours 14 minutes 14 seconds  Findings:      The perianal and digital rectal examinations were normal.      The terminal ileum appeared normal.      A 6 mm polyp was found in the proximal ascending colon. The polyp was       sessile. The polyp was removed with a cold snare. Resection and       retrieval were complete.      Non-bleeding internal hemorrhoids were found during retroflexion. The       hemorrhoids were small. Impression:               - The examined portion of the ileum was normal.                           - One 6 mm polyp in the proximal ascending colon,                            removed with a cold snare. Resected and retrieved.                           - Non-bleeding internal hemorrhoids. Moderate Sedation:      Per Anesthesia Care Recommendation:           - Discharge patient to home (ambulatory).                           - Resume previous diet.                           - Await pathology results.                           - Repeat colonoscopy for  surveillance based on                            pathology results.                           -Schedule capsule endoscopy Procedure Code(s):        --- Professional ---                           [phone], Colonoscopy, flexible; with removal of                            tumor(s), polyp(s), or other lesion(s) by snare                            technique Diagnosis Code(s):        --- Professional ---                           K64.8, Other hemorrhoids                           D12.2, Benign neoplasm of ascending colon                           D50.9, Iron deficiency anemia, unspecified CPT copyright 2022 American Medical Association.  All rights reserved. The codes documented in this report are preliminary and upon coder review may  be revised to meet current compliance requirements. Toribio Fortune, MD Toribio Fortune,  01/20/2024 9:14:52 AM This report has been signed electronically. Number of Addenda: 0

## 2024-01-20 NOTE — Anesthesia Preprocedure Evaluation (Signed)
 Anesthesia Evaluation  Patient identified by MRN, date of birth, ID band Patient awake    Reviewed: Allergy & Precautions, H&P , NPO status , Patient's Chart, lab work & pertinent test results, reviewed documented beta blocker date and time   Airway Mallampati: II  TM Distance: >3 FB Neck ROM: full    Dental no notable dental hx.    Pulmonary neg pulmonary ROS, sleep apnea    Pulmonary exam normal breath sounds clear to auscultation       Cardiovascular Exercise Tolerance: Good hypertension, negative cardio ROS  Rhythm:regular Rate:Normal     Neuro/Psych  Headaches negative neurological ROS  negative psych ROS   GI/Hepatic negative GI ROS, Neg liver ROS,,,  Endo/Other  negative endocrine ROSdiabetes  Class 4 obesity  Renal/GU negative Renal ROS  negative genitourinary   Musculoskeletal   Abdominal   Peds  Hematology negative hematology ROS (+) Blood dyscrasia, anemia   Anesthesia Other Findings   Reproductive/Obstetrics negative OB ROS                              Anesthesia Physical Anesthesia Plan  ASA: 3  Anesthesia Plan: MAC   Post-op Pain Management:    Induction:   PONV Risk Score and Plan: Propofol  infusion  Airway Management Planned:   Additional Equipment:   Intra-op Plan:   Post-operative Plan:   Informed Consent: I have reviewed the patients History and Physical, chart, labs and discussed the procedure including the risks, benefits and alternatives for the proposed anesthesia with the patient or authorized representative who has indicated his/her understanding and acceptance.     Dental Advisory Given  Plan Discussed with: CRNA  Anesthesia Plan Comments:         Anesthesia Quick Evaluation

## 2024-01-20 NOTE — Telephone Encounter (Signed)
 Spoke with patient, scheduled capsule endoscopy for 01/28/2024 at 7am. Sending instructions to mychart.

## 2024-01-21 ENCOUNTER — Encounter (INDEPENDENT_AMBULATORY_CARE_PROVIDER_SITE_OTHER): Payer: Self-pay | Admitting: *Deleted

## 2024-01-21 ENCOUNTER — Ambulatory Visit (INDEPENDENT_AMBULATORY_CARE_PROVIDER_SITE_OTHER): Payer: Self-pay | Admitting: Gastroenterology

## 2024-01-21 LAB — SURGICAL PATHOLOGY

## 2024-01-21 NOTE — Progress Notes (Signed)
 5 yr TCS noted in recall Patient result letter mailed Patient's PCP is on EPIC

## 2024-01-23 NOTE — Anesthesia Postprocedure Evaluation (Signed)
 Anesthesia Post Note  Patient: Vanessa [name]  Procedure(s) Performed: COLONOSCOPY EGD (ESOPHAGOGASTRODUODENOSCOPY)  Patient location during evaluation: Phase II Anesthesia Type: MAC Level of consciousness: awake Pain management: pain level controlled Vital Signs Assessment: post-procedure vital signs reviewed and stable Respiratory status: spontaneous breathing and respiratory function stable Cardiovascular status: blood pressure returned to baseline and stable Postop Assessment: no headache and no apparent nausea or vomiting Anesthetic complications: no Comments: Late entry   No notable events documented.   Last Vitals:  Vitals:   01/20/24 0716 01/20/24 0916  BP: (!) 154/87 106/62  Pulse: 75 87  Resp: 10 19  Temp: 36.7 C (!) 36.2 C  SpO2: 100% 100%    Last Pain:  Vitals:   01/20/24 0916  TempSrc: Axillary  PainSc: 0-No pain                 Yvonna JINNY Bosworth

## 2024-01-26 ENCOUNTER — Encounter (HOSPITAL_COMMUNITY): Payer: Self-pay | Admitting: Gastroenterology

## 2024-01-28 ENCOUNTER — Encounter (HOSPITAL_COMMUNITY)
Admission: RE | Disposition: A | Discharge status: Home / Self Care | Payer: Self-pay | Source: Home / Self Care | Attending: Gastroenterology

## 2024-01-28 ENCOUNTER — Ambulatory Visit (HOSPITAL_COMMUNITY)
Admission: RE | Admit: 2024-01-28 | Discharge: 2024-01-28 | Disposition: A | Discharge status: Home / Self Care | Attending: Gastroenterology | Admitting: Gastroenterology

## 2024-01-28 DIAGNOSIS — K31819 Angiodysplasia of stomach and duodenum without bleeding: Secondary | ICD-10-CM

## 2024-01-28 DIAGNOSIS — K922 Gastrointestinal hemorrhage, unspecified: Secondary | ICD-10-CM

## 2024-01-28 DIAGNOSIS — D509 Iron deficiency anemia, unspecified: Secondary | ICD-10-CM | POA: Diagnosis not present

## 2024-01-28 HISTORY — PX: GIVENS CAPSULE STUDY: SHX5432

## 2024-01-28 SURGERY — IMAGING PROCEDURE, GI TRACT, INTRALUMINAL, VIA CAPSULE
Anesthesia: Choice

## 2024-01-29 ENCOUNTER — Ambulatory Visit: Payer: 59

## 2024-01-29 VITALS — BP 163/95 | HR 80 | Ht 70.0 in | Wt 330.0 lb

## 2024-01-29 DIAGNOSIS — Z1231 Encounter for screening mammogram for malignant neoplasm of breast: Secondary | ICD-10-CM

## 2024-01-29 DIAGNOSIS — Z0000 Encounter for general adult medical examination without abnormal findings: Secondary | ICD-10-CM

## 2024-01-29 NOTE — Progress Notes (Signed)
 Chief Complaint  Patient presents with   Medicare Wellness     Subjective:   Vanessa [name] is a 48 y.o. female who presents for a Medicare Annual Wellness Visit.  Visit info / Clinical Intake: Medicare Wellness Visit Type:: Subsequent Annual Wellness Visit Persons participating in visit and providing information:: patient Medicare Wellness Visit Mode:: Telephone If telephone:: video declined Since this visit was completed virtually, some vitals may be partially provided or unavailable. Missing vitals are due to the limitations of the virtual format.: Documented vitals are patient reported If Telephone or Video please confirm:: I connected with patient using audio/video enable telemedicine. I verified patient identity with two identifiers, discussed telehealth limitations, and patient agreed to proceed. Patient Location:: home Provider Location:: home office Interpreter Needed?: No Pre-visit prep was completed: yes AWV questionnaire completed by patient prior to visit?: no Living arrangements:: with family/others Patient's Overall Health Status Rating: very good Typical amount of pain: none Does pain affect daily life?: no Are you currently prescribed opioids?: no  Dietary Habits and Nutritional Risks How many meals a day?: 2 Eats fruit and vegetables daily?: yes Most meals are obtained by: preparing own meals Diabetic:: (!) yes Any non-healing wounds?: no How often do you check your BS?: 2 Would you like to be referred to a Nutritionist or for Diabetic Management? : no  Functional Status Activities of Daily Living (to include ambulation/medication): Independent Ambulation: Independent Medication Administration: Independent Home Management (perform basic housework or laundry): Independent Manage your own finances?: yes Primary transportation is: family / friends (family members) Concerns about hearing?: no  Fall Screening Falls in the past year?: 0 Number of falls in  past year: 0 Was there an injury with Fall?: 0 Fall Risk Category Calculator: 0 Patient Fall Risk Level: Low Fall Risk  Fall Risk Patient at Risk for Falls Due to: No Fall Risks Fall risk Follow up: Falls evaluation completed; Education provided  Home and Transportation Safety: All rugs have non-skid backing?: yes All stairs or steps have railings?: yes Grab bars in the bathtub or shower?: yes Have non-skid surface in bathtub or shower?: yes Good home lighting?: yes Regular seat belt use?: yes Hospital stays in the last year:: no  Cognitive Assessment Difficulty concentrating, remembering, or making decisions? : yes Will 6CIT or Mini Cog be Completed: yes (Concentration) What year is it?: 0 points What month is it?: 0 points Give patient an address phrase to remember (5 components): [address] [place] About what time is it?: 0 points Count backwards from 20 to 1: 0 points Say the months of the year in reverse: 0 points Repeat the address phrase from earlier: 0 points 6 CIT Score: 0 points  Advance Directives (For Healthcare) Does Patient Have a Medical Advance Directive?: No Would patient like information on creating a medical advance directive?: No - Patient declined  Reviewed/Updated  Reviewed/Updated: Reviewed All (Medical, Surgical, Family, Medications, Allergies, Care Teams, Patient Goals); Medical History; Surgical History; Family History; Medications; Allergies; Care Teams; Patient Goals    Allergies (verified) Iodinated contrast media, Metrizamide, Mushroom extract complex (obsolete), Other, Peanut oil, Peanut-containing drug products, Diflucan  in dextrose [fluconazole  in dextrose], and Iodine-131   Current Medications (verified) Outpatient Encounter Medications as of 01/29/2024  Medication Sig   amitriptyline  (ELAVIL ) 50 MG tablet Take 1 tablet (50 mg total) by mouth daily.   atorvastatin  (LIPITOR) 80 MG tablet Take 1 tablet (80 mg total) by mouth  daily.   Blood Glucose Monitoring Suppl (ONE TOUCH  ULTRA 2) w/Device KIT Check BS 3 times a day Dx E11.65   Continuous Glucose Sensor (FREESTYLE LIBRE 3 SENSOR) MISC Place 1 sensor on the skin every 14 days. Use to check glucose continuously   cyclobenzaprine  (FLEXERIL ) 10 MG tablet TAKE 1 TABLET BY MOUTH THREE TIMES A DAY AS NEEDED FOR MUSCLE SPASMS   empagliflozin  (JARDIANCE ) 25 MG TABS tablet Take 1 tablet (25 mg total) by mouth daily before breakfast.   EPINEPHrine  (EPIPEN  2-PAK) 0.3 mg/0.3 mL IJ SOAJ injection Inject 0.3 mg into the muscle as needed for anaphylaxis.   furosemide  (LASIX ) 40 MG tablet Take 1 tablet (40 mg total) by mouth daily.   glucose blood (ACCU-CHEK GUIDE TEST) test strip Check BS 3 times a day Dx E11.65   hydrOXYzine  (VISTARIL ) 25 MG capsule Take 1-2 capsules (25-50 mg total) by mouth at bedtime and may repeat dose one time if needed.   Lancets (ONETOUCH DELICA PLUS LANCET33G) MISC Check BS 3 times a day Dx E11.65   losartan  (COZAAR ) 100 MG tablet Take 1 tablet (100 mg total) by mouth daily.   metoprolol  succinate (TOPROL -XL) 25 MG 24 hr tablet Take 1 tablet (25 mg total) by mouth daily.   montelukast  (SINGULAIR ) 10 MG tablet Take 1 tablet (10 mg total) by mouth at bedtime.   omeprazole  (PRILOSEC) 40 MG capsule Take 1 capsule (40 mg total) by mouth daily.   Oxycodone  HCl 10 MG TABS Take 1 tablet (10 mg total) by mouth 2 (two) times daily as needed.   promethazine -dextromethorphan  (PROMETHAZINE -DM) 6.25-15 MG/5ML syrup Take 5 mLs by mouth 4 (four) times daily as needed for cough.   Rimegepant Sulfate (NURTEC) 75 MG TBDP Take 75 mg by mouth daily as needed. Onset migraine (max one daily)   Semaglutide , 2 MG/DOSE, (OZEMPIC , 2 MG/DOSE,) 8 MG/3ML SOPN INJECT 2MG  SUBCUTANEOUSLY AS DIRECTED ONCE A WEEK   spironolactone  (ALDACTONE ) 100 MG tablet Take 1 tablet (100 mg total) by mouth 2 (two) times daily.   dicyclomine (BENTYL) 20 MG tablet Take 20 mg by mouth 2 (two) times daily.  (Patient not taking: Reported on 01/29/2024)   Oxycodone  HCl 10 MG TABS Take 1 tablet (10 mg total) by mouth 2 (two) times daily as needed. (Patient not taking: Reported on 01/29/2024)   Oxycodone  HCl 10 MG TABS Take 1 tablet (10 mg total) by mouth 2 (two) times daily as needed. (Patient not taking: Reported on 01/29/2024)   No facility-administered encounter medications on file as of 01/29/2024.    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   Hypertension    Microcytic anemia 11/28/2023   Migraines    Pseudotumor (inflammatory) of orbit    Sepsis (HCC) 02/2019   Sleep apnea    Vaginal Pap smear, abnormal    Vision loss    Past Surgical History:  Procedure Laterality Date   arm surgery Left 08/20/2022   BRAIN SURGERY     shunt on right with removal and reinserted on left   COLONOSCOPY N/A 01/20/2024   Procedure: COLONOSCOPY;  Surgeon: Eartha Angelia Sieving, MD;  [facility];  Service: Gastroenterology;  Laterality: N/A;  8:15am, ASA 3   CSF SHUNT     lower back   ESOPHAGOGASTRODUODENOSCOPY N/A 01/20/2024   Procedure: EGD (ESOPHAGOGASTRODUODENOSCOPY);  Surgeon: Eartha Angelia, Sieving, MD;  Location: [facility];  Service: Gastroenterology;  Laterality: N/A;   TRACHEOSTOMY     Family History  Problem Relation  Age of Onset   Pulmonary Hypertension Mother    Congestive Heart Failure Mother    Hypertension Sister    Obesity Sister    Diabetes Maternal Grandmother    Breast cancer Cousin    Migraines Neg Hx    Social History   Occupational History   Occupation: disability  Tobacco Use   Smoking status: Never   Smokeless tobacco: Never  Vaping Use   Vaping status: Never Used  Substance and Sexual Activity   Alcohol use: No   Drug use: No   Sexual activity: Not Currently    Birth control/protection: Condom   Tobacco Counseling Counseling given: Yes  SDOH Screenings   Food Insecurity: No Food  Insecurity (01/29/2024)  Recent Concern: Food Insecurity - Food Insecurity Present (12/09/2023)  Housing: Low Risk  (01/29/2024)  Transportation Needs: No Transportation Needs (01/29/2024)  Utilities: Not At Risk (01/29/2024)  Alcohol Screen: Low Risk  (01/28/2023)  Depression (PHQ2-9): Low Risk  (01/29/2024)  Financial Resource Strain: Medium Risk (12/09/2023)  Physical Activity: Insufficiently Active (01/29/2024)  Social Connections: Moderately Integrated (01/29/2024)  Stress: Stress Concern Present (01/29/2024)  Tobacco Use: Low Risk  (01/29/2024)  Health Literacy: Adequate Health Literacy (01/28/2023)   See flowsheets for full screening details  Depression Screen PHQ 2 & 9 Depression Scale- Over the past 2 weeks, how often have you been bothered by any of the following problems? Little interest or pleasure in doing things: 0 Feeling down, depressed, or hopeless (PHQ Adolescent also includes...irritable): 0 PHQ-2 Total Score: 0 Trouble falling or staying asleep, or sleeping too much: 0 Feeling tired or having little energy: 0 Poor appetite or overeating (PHQ Adolescent also includes...weight loss): 0 Feeling bad about yourself - or that you are a failure or have let yourself or your family down: 0 Trouble concentrating on things, such as reading the newspaper or watching television (PHQ Adolescent also includes...like school work): 0 Moving or speaking so slowly that other people could have noticed. Or the opposite - being so fidgety or restless that you have been moving around a lot more than usual: 0 Thoughts that you would be better off dead, or of hurting yourself in some way: 0 PHQ-9 Total Score: 0 If you checked off any problems, how difficult have these problems made it for you to do your work, take care of things at home, or get along with other people?: Not difficult at all     Goals Addressed             This Visit's Progress    Achieve a Healthy Weight-Obesity   On track     Timeframe:  Long-Range Goal Priority:  High Start Date:                             Expected End Date:                       Follow Up Date 01/25/2022    - drink 6 to 8 glasses of water each day - eat 3 to 5 servings of fruits and vegetables each day - eat 5 or 6 small meals each day - join a weight loss program - manage portion size - set goal weight               Objective:    Today's Vitals   01/29/24 1424  BP: (!) 163/95  Pulse: 80  Weight: (!) 330  lb (149.7 kg)  Height: 5' 10 (1.778 m)   Body mass index is 47.35 kg/m.  Hearing/Vision screen Hearing Screening - Comments:: Pt has trinities  Vision Screening - Comments:: Pt wear glasses/pt goes to [facility] in [place]/last ov 2025 Immunizations and Health Maintenance Health Maintenance  Topic Date Due   OPHTHALMOLOGY EXAM  05/02/2023   COVID-19 Vaccine (5 - 2025-26 season) 10/27/2023   Mammogram  02/14/2024   Hepatitis B Vaccines 19-59 Average Risk (1 of 3 - 19+ 3-dose series) 09/30/2024 (Originally 05/18/1995)   DTaP/Tdap/Td (2 - Td or Tdap) 02/26/2024   HEMOGLOBIN A1C  04/02/2024   Diabetic kidney evaluation - Urine ACR  09/30/2024   FOOT EXAM  09/30/2024   Diabetic kidney evaluation - eGFR measurement  12/08/2024   Medicare Annual Wellness (AWV)  01/28/2025   Fecal DNA (Cologuard)  02/26/2025   Cervical Cancer Screening (HPV/Pap Cotest)  10/04/2025   Influenza Vaccine  Completed   Hepatitis C Screening  Completed   HIV Screening  Completed   HPV VACCINES  Aged Out   Meningococcal B Vaccine  Aged Out   Pneumococcal Vaccine  Discontinued   Colonoscopy  Discontinued        Assessment/Plan:  This is a routine wellness examination for Zyan.  Patient Care Team: Severa Rock HERO, FNP as PCP - General (Family [facility], [facility], Dino HERO, MD as Physician Assistant (Neurosurgery) Dorrine Ditch, MD as Referring Physician (Physical Medicine and  Rehabilitation)  I have personally reviewed and noted the following in the patient's chart:   Medical and social history Use of alcohol, tobacco or illicit drugs  Current medications and supplements including opioid prescriptions. Functional ability and status Nutritional status Physical activity Advanced directives List of other physicians Hospitalizations, surgeries, and ER visits in previous 12 months Vitals Screenings to include cognitive, depression, and falls Referrals and appointments  Orders Placed This Encounter  Procedures   MM DIGITAL SCREENING BILATERAL    Pt would like to have it done at mobile mammo at [facility]    Standing Status:   Future    Expiration Date:   01/28/2025    Reason for Exam (SYMPTOM  OR DIAGNOSIS REQUIRED):   breast cancer screening    Is the patient pregnant?:   No    Preferred imaging location?:   [facility]    Release to patient:   Immediate   In addition, I have reviewed and discussed with patient certain preventive protocols, quality metrics, and best practice recommendations. A written personalized care plan for preventive services as well as general preventive health recommendations were provided to patient.   Ozie Ned, CMA   01/29/2024   Return in 1 year (on 01/28/2025).  After Visit Summary: (MyChart) Due to this being a telephonic visit, the after visit summary with patients personalized plan was offered to patient via MyChart   Nurse Notes: send request to care team-diabetic eye exam

## 2024-01-30 ENCOUNTER — Encounter (HOSPITAL_COMMUNITY): Payer: Self-pay | Admitting: Gastroenterology

## 2024-01-31 ENCOUNTER — Telehealth (INDEPENDENT_AMBULATORY_CARE_PROVIDER_SITE_OTHER): Payer: Self-pay | Admitting: Gastroenterology

## 2024-01-31 NOTE — Procedures (Signed)
 Small Bowel Givens Capsule Study Procedure date: 01/28/2024  Referring Provider:  Rock July, [facility] PCP:  Dr. Severa, Rock HERO, FNP  Indication for procedure:  IDA      Findings:  Study was adequate as capsule reached the cecum and the bowel preparation was adequate in the small bowel. There was presence of tiny small bowel in the mid small bowel. Importantly, there was evidence of a large clot located in the distal small bowel, possibly in the terminal ileum which raises concern for ongoing bleeding.  Summary & Recommendations: Importantly, there was evidence of a large clot located in the distal small bowel, possibly in the terminal ileum which raises concern for ongoing bleeding. 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  Last colonoscopy there was no presence of any terminal ileum abnormalities.  We will attempt to evaluate this again with a repeat colonoscopy attempt in 2 advance the colonoscope more proximally in this attempt.  This will be done as outpatient as the patient denies having any clinical bleeding (no hematochezia or melena).  I personally communicated these recommendations to the patient  Toribio Fortune, MD Gastroenterology and Hepatology [facility] Gastroenterology

## 2024-01-31 NOTE — Telephone Encounter (Signed)
 Hi Autumn/Mindy/Tammy,   Can you please schedule a colonoscopy in the next available.? Dx: Hematochezia and iron deficiency anemia. Room: 3.  She will need a 2-day prep. She will need to follow guidelines for semaglutide  and Jardiance .  Thanks,  Toribio Fortune, MD Gastroenterology and Hepatology [facility]

## 2024-02-03 MED ORDER — PEG 3350-KCL-NA BICARB-NACL 420 G PO SOLR: 4000.0000 mL | Freq: Once | ORAL | 0 refills | Status: AC

## 2024-02-03 NOTE — Addendum Note (Signed)
 Addended by: DALLIE LIONEL RAMAN on: 02/03/2024 12:15 PM   Modules accepted: Orders

## 2024-02-03 NOTE — Telephone Encounter (Signed)
 Spoke with patient, scheduled TCS for 02/10/2024 at 11am (ok per Dr. Eartha for room 1). Rx sent to pharmacy. Instructions sent to patient mychart.

## 2024-02-03 NOTE — Telephone Encounter (Signed)
 Conversation with Dr. Eartha:  This patient you had sent me a message to schedule a colonoscopy for, you had said room 3. Would room 1 be okay? The only day that she can do is 02/10/2024 and you are in room 1. - Robby Pirani H.  Dr. Eartha - hi, yeah room 1 is ok.

## 2024-02-03 NOTE — Telephone Encounter (Signed)
 PA on [facility] for TCS: Notification or Prior Authorization is not required for the requested services You are not required to submit a notification/prior authorization based on the information provided. Decision ID #: [id number]

## 2024-02-04 ENCOUNTER — Ambulatory Visit: Payer: Self-pay | Admitting: Family Medicine

## 2024-02-04 ENCOUNTER — Encounter: Payer: Self-pay | Admitting: Family Medicine

## 2024-02-04 DIAGNOSIS — E1159 Type 2 diabetes mellitus with other circulatory complications: Secondary | ICD-10-CM

## 2024-02-04 DIAGNOSIS — E1165 Type 2 diabetes mellitus with hyperglycemia: Secondary | ICD-10-CM

## 2024-02-10 ENCOUNTER — Ambulatory Visit (HOSPITAL_COMMUNITY): Admitting: Anesthesiology

## 2024-02-10 ENCOUNTER — Other Ambulatory Visit: Payer: Self-pay

## 2024-02-10 ENCOUNTER — Encounter (HOSPITAL_COMMUNITY): Admission: RE | Disposition: A | Payer: Self-pay | Attending: Gastroenterology

## 2024-02-10 ENCOUNTER — Encounter (HOSPITAL_COMMUNITY): Payer: Self-pay | Admitting: Gastroenterology

## 2024-02-10 ENCOUNTER — Ambulatory Visit (HOSPITAL_COMMUNITY)
Admission: RE | Admit: 2024-02-10 | Discharge: 2024-02-10 | Disposition: A | Attending: Gastroenterology | Admitting: Gastroenterology

## 2024-02-10 DIAGNOSIS — Z7984 Long term (current) use of oral hypoglycemic drugs: Secondary | ICD-10-CM | POA: Diagnosis not present

## 2024-02-10 DIAGNOSIS — G4730 Sleep apnea, unspecified: Secondary | ICD-10-CM | POA: Insufficient documentation

## 2024-02-10 DIAGNOSIS — E785 Hyperlipidemia, unspecified: Secondary | ICD-10-CM | POA: Diagnosis not present

## 2024-02-10 DIAGNOSIS — Z91014 Allergy to mammalian meats: Secondary | ICD-10-CM | POA: Diagnosis not present

## 2024-02-10 DIAGNOSIS — D509 Iron deficiency anemia, unspecified: Secondary | ICD-10-CM | POA: Diagnosis present

## 2024-02-10 DIAGNOSIS — Z6841 Body Mass Index (BMI) 40.0 and over, adult: Secondary | ICD-10-CM | POA: Diagnosis not present

## 2024-02-10 DIAGNOSIS — K648 Other hemorrhoids: Secondary | ICD-10-CM | POA: Insufficient documentation

## 2024-02-10 DIAGNOSIS — E119 Type 2 diabetes mellitus without complications: Secondary | ICD-10-CM | POA: Insufficient documentation

## 2024-02-10 DIAGNOSIS — E6689 Other obesity not elsewhere classified: Secondary | ICD-10-CM | POA: Insufficient documentation

## 2024-02-10 DIAGNOSIS — I10 Essential (primary) hypertension: Secondary | ICD-10-CM | POA: Diagnosis not present

## 2024-02-10 DIAGNOSIS — Z7985 Long-term (current) use of injectable non-insulin antidiabetic drugs: Secondary | ICD-10-CM | POA: Insufficient documentation

## 2024-02-10 DIAGNOSIS — R933 Abnormal findings on diagnostic imaging of other parts of digestive tract: Secondary | ICD-10-CM | POA: Diagnosis not present

## 2024-02-10 HISTORY — PX: COLONOSCOPY: SHX5424

## 2024-02-10 HISTORY — PX: SCLEROTHERAPY: SHX6841

## 2024-02-10 LAB — GLUCOSE, CAPILLARY: Glucose-Capillary: 94 mg/dL (ref 70–99)

## 2024-02-10 LAB — POCT PREGNANCY, URINE: Preg Test, Ur: NEGATIVE

## 2024-02-10 SURGERY — COLONOSCOPY
Anesthesia: Monitor Anesthesia Care

## 2024-02-10 MED ORDER — SPOT INK MARKER SYRINGE KIT
PACK | SUBMUCOSAL | Status: DC | PRN
  Administered 2024-02-10: 11:00:00 .6 mL via SUBMUCOSAL

## 2024-02-10 MED ORDER — GLUCAGON HCL RDNA (DIAGNOSTIC) 1 MG IJ SOLR
INTRAMUSCULAR | Status: DC | PRN
  Administered 2024-02-10: 11:00:00 .25 mg via INTRAVENOUS

## 2024-02-10 MED ORDER — LACTATED RINGERS IV SOLN: INTRAVENOUS | Status: DC | PRN

## 2024-02-10 MED ORDER — LIDOCAINE 2% (20 MG/ML) 5 ML SYRINGE
INTRAMUSCULAR | Status: DC | PRN
  Administered 2024-02-10: 11:00:00 50 mg via INTRAVENOUS

## 2024-02-10 MED ORDER — PROPOFOL 10 MG/ML IV BOLUS
INTRAVENOUS | Status: DC | PRN
  Administered 2024-02-10: 11:00:00 100 mg via INTRAVENOUS

## 2024-02-10 MED ORDER — PROPOFOL 500 MG/50ML IV EMUL
INTRAVENOUS | Status: DC | PRN
  Administered 2024-02-10: 11:00:00 125 ug/kg/min via INTRAVENOUS

## 2024-02-10 NOTE — Discharge Instructions (Addendum)
 You are being discharged to home.  Resume your previous diet.  Continue your present medications.  Continue follow up with hematology.

## 2024-02-10 NOTE — Transfer of Care (Signed)
 Immediate Anesthesia Transfer of Care Note  Patient: Vanessa [name]  Procedure(s) Performed: COLONOSCOPY SCLEROTHERAPY  Patient Location: PACU and Endoscopy Unit  Anesthesia Type:General  Level of Consciousness: awake  Airway & Oxygen Therapy: Patient Spontanous Breathing  Post-op Assessment: Report given to RN  Post vital signs: Reviewed  Last Vitals:  Vitals Value Taken Time  BP    Temp    Pulse    Resp    SpO2      Last Pain:  Vitals:   02/10/24 1059  TempSrc:   PainSc: 6       Patients Stated Pain Goal: 8 (02/10/24 0957)  Complications: No notable events documented.

## 2024-02-10 NOTE — Anesthesia Preprocedure Evaluation (Signed)
 Anesthesia Evaluation  Patient identified by MRN, date of birth, ID band Patient awake    Reviewed: Allergy & Precautions, H&P , NPO status , Patient's Chart, lab work & pertinent test results, reviewed documented beta blocker date and time   Airway Mallampati: II  TM Distance: >3 FB Neck ROM: full    Dental no notable dental hx.    Pulmonary sleep apnea    Pulmonary exam normal breath sounds clear to auscultation       Cardiovascular Exercise Tolerance: Good hypertension,  Rhythm:regular Rate:Normal     Neuro/Psych  Headaches  negative psych ROS   GI/Hepatic negative GI ROS, Neg liver ROS,,,  Endo/Other  diabetes  Class 4 obesity  Renal/GU negative Renal ROS  negative genitourinary   Musculoskeletal   Abdominal   Peds  Hematology  (+) Blood dyscrasia, anemia   Anesthesia Other Findings   Reproductive/Obstetrics negative OB ROS                              Anesthesia Physical Anesthesia Plan  ASA: 3  Anesthesia Plan: MAC   Post-op Pain Management:    Induction:   PONV Risk Score and Plan: Propofol  infusion  Airway Management Planned:   Additional Equipment:   Intra-op Plan:   Post-operative Plan:   Informed Consent: I have reviewed the patients History and Physical, chart, labs and discussed the procedure including the risks, benefits and alternatives for the proposed anesthesia with the patient or authorized representative who has indicated his/her understanding and acceptance.     Dental Advisory Given  Plan Discussed with: CRNA  Anesthesia Plan Comments:         Anesthesia Quick Evaluation

## 2024-02-10 NOTE — Op Note (Signed)
 [facility] Patient Name: Vanessa [name] Procedure Date: 02/10/2024 10:48 AM MRN: [id number] Date of Birth: Mar 05, 1975 Attending MD: Toribio Fortune , , [id number] CSN: [id number] Age: 48 Admit Type: Outpatient Procedure:                Colonoscopy Indications:              Iron deficiency anemia, Abnormal video capsule                            endoscopy Providers:                Toribio Fortune, Rosina Sprague, Madelin Hunter, RN Referring MD:              Medicines:                Monitored Anesthesia Care Complications:            No immediate complications. Estimated Blood Loss:     Estimated blood loss: none. Procedure:                Pre-Anesthesia Assessment:                           - Prior to the procedure, a History and Physical                            was performed, and patient medications, allergies                            and sensitivities were reviewed. The patient's                            tolerance of previous anesthesia was reviewed.                           - The risks and benefits of the procedure and the                            sedation options and risks were discussed with the                            patient. All questions were answered and informed                            consent was obtained.                           - ASA Grade Assessment: II - A patient with mild                            systemic disease.                           After obtaining informed consent, the colonoscope                            was passed under direct vision. Throughout the  procedure, the patient's blood pressure, pulse, and                            oxygen saturations were monitored continuously. The                            [id number]-[id number] ([id number]) Peds Colon was introduced                            through the anus and advanced to the the terminal                            ileum, 40 cm from IC valve. The colonoscopy was                             performed without difficulty. The patient tolerated                            the procedure well. The quality of the bowel                            preparation was excellent. Scope In: 11:06:23 AM Scope Out: 11:24:49 AM Scope Withdrawal Time: 0 hours 15 minutes 40 seconds  Total Procedure Duration: 0 hours 18 minutes 26 seconds  Findings:      The perianal and digital rectal examinations were normal.      The terminal ileum appeared normal. Scope was easily advanced up to 40       cm from IC valve. No signs of fresh blood or lesion were seen. Most       proximal area reached with the scope was tattooed with an injection of       0.6 mL of Spot (carbon black).      The colon (entire examined portion) appeared normal.      Non-bleeding internal hemorrhoids were found during retroflexion. The       hemorrhoids were small. Impression:               - The examined portion of the ileum was normal.                            Tattooed.                           - The entire examined colon is normal.                           - Non-bleeding internal hemorrhoids.                           - No specimens collected. Moderate Sedation:      Per Anesthesia Care Recommendation:           - Discharge patient to home (ambulatory).                           - Resume previous diet.                           -  Continue present medications.                           - Continue follow up with hematology.                           - If presenting worsening anemia or overt bleeding,                            will refer to [facility] for evaluation of DBE. Procedure Code(s):        --- Professional ---                           [phone], Colonoscopy, flexible; with directed                            submucosal injection(s), any substance Diagnosis Code(s):        --- Professional ---                           K64.8, Other hemorrhoids                           D50.9, Iron deficiency  anemia, unspecified                           R93.3, Abnormal findings on diagnostic imaging of                            other parts of digestive tract CPT copyright 2022 American Medical Association. All rights reserved. The codes documented in this report are preliminary and upon coder review may  be revised to meet current compliance requirements. Toribio Fortune, MD Toribio Fortune,  02/10/2024 11:36:14 AM This report has been signed electronically. Number of Addenda: 0

## 2024-02-10 NOTE — Interval H&P Note (Signed)
 History and Physical Interval Note:  02/10/2024 10:18 AM  Patient underwent capsule endoscopy, found to have blood in TI. Will proceed with ileocolonoscopy.  Vanessa [name]  has presented today for surgery, with the diagnosis of hematochezia, iron deficiency anemia.  The various methods of treatment have been discussed with the patient and family. After consideration of risks, benefits and other options for treatment, the patient has consented to  Procedures with comments: COLONOSCOPY (N/A) - 11:00am, ASA 3- room 1 ok as a surgical intervention.  The patient's history has been reviewed, patient examined, no change in status, stable for surgery.  I have reviewed the patient's chart and labs.  Questions were answered to the patient's satisfaction.     Vanessa [name]

## 2024-02-12 ENCOUNTER — Encounter (HOSPITAL_COMMUNITY): Payer: Self-pay | Admitting: Gastroenterology

## 2024-02-13 NOTE — Anesthesia Postprocedure Evaluation (Signed)
"   Anesthesia Post Note  Patient: Adelai Achey  Procedure(s) Performed: COLONOSCOPY SCLEROTHERAPY  Patient location during evaluation: Phase II Anesthesia Type: MAC Level of consciousness: awake Pain management: pain level controlled Vital Signs Assessment: post-procedure vital signs reviewed and stable Respiratory status: spontaneous breathing and respiratory function stable Cardiovascular status: blood pressure returned to baseline and stable Postop Assessment: no headache and no apparent nausea or vomiting Anesthetic complications: no Comments: Late entry   No notable events documented.   Last Vitals:  Vitals:   02/10/24 1132 02/10/24 1138  BP: (!) 155/77 (!) 167/85  Pulse: 79 79  Resp: 19 20  Temp: 36.4 C   SpO2: 100% 97%    Last Pain:  Vitals:   02/10/24 1138  TempSrc:   PainSc: 0-No pain                 Yvonna PARAS Cristen Murcia      "

## 2024-02-21 ENCOUNTER — Other Ambulatory Visit: Payer: Self-pay | Admitting: Family Medicine

## 2024-02-21 DIAGNOSIS — E1165 Type 2 diabetes mellitus with hyperglycemia: Secondary | ICD-10-CM

## 2024-02-27 ENCOUNTER — Encounter: Payer: Self-pay | Admitting: Hematology and Oncology

## 2024-03-04 ENCOUNTER — Other Ambulatory Visit: Payer: Self-pay

## 2024-03-04 DIAGNOSIS — D509 Iron deficiency anemia, unspecified: Secondary | ICD-10-CM

## 2024-03-05 ENCOUNTER — Inpatient Hospital Stay: Attending: Hematology and Oncology

## 2024-03-05 DIAGNOSIS — D509 Iron deficiency anemia, unspecified: Secondary | ICD-10-CM | POA: Diagnosis present

## 2024-03-05 DIAGNOSIS — D500 Iron deficiency anemia secondary to blood loss (chronic): Secondary | ICD-10-CM | POA: Diagnosis not present

## 2024-03-05 LAB — FERRITIN: Ferritin: 80 ng/mL (ref 11–307)

## 2024-03-05 LAB — CBC WITH DIFFERENTIAL/PLATELET
Abs Immature Granulocytes: 0.02 K/uL (ref 0.00–0.07)
Basophils Absolute: 0 K/uL (ref 0.0–0.1)
Basophils Relative: 0 %
Eosinophils Absolute: 0.1 K/uL (ref 0.0–0.5)
Eosinophils Relative: 2 %
HCT: 40.1 % (ref 36.0–46.0)
Hemoglobin: 12.4 g/dL (ref 12.0–15.0)
Immature Granulocytes: 0 %
Lymphocytes Relative: 29 %
Lymphs Abs: 2 K/uL (ref 0.7–4.0)
MCH: 26.4 pg (ref 26.0–34.0)
MCHC: 30.9 g/dL (ref 30.0–36.0)
MCV: 85.3 fL (ref 80.0–100.0)
Monocytes Absolute: 0.5 K/uL (ref 0.1–1.0)
Monocytes Relative: 7 %
Neutro Abs: 4.3 K/uL (ref 1.7–7.7)
Neutrophils Relative %: 62 %
Platelets: 188 K/uL (ref 150–400)
RBC: 4.7 MIL/uL (ref 3.87–5.11)
RDW: 17.6 % — ABNORMAL HIGH (ref 11.5–15.5)
WBC: 6.9 K/uL (ref 4.0–10.5)
nRBC: 0 % (ref 0.0–0.2)

## 2024-03-05 LAB — IRON AND TIBC
Iron: 61 ug/dL (ref 28–170)
Saturation Ratios: 19 % (ref 10.4–31.8)
TIBC: 316 ug/dL (ref 250–450)
UIBC: 255 ug/dL

## 2024-03-10 ENCOUNTER — Inpatient Hospital Stay: Admitting: Oncology

## 2024-03-12 ENCOUNTER — Inpatient Hospital Stay: Admitting: Oncology

## 2024-03-19 ENCOUNTER — Inpatient Hospital Stay: Admitting: Oncology

## 2024-03-19 VITALS — BP 133/71 | HR 78 | Temp 98.9°F | Resp 18 | Ht 70.0 in | Wt 332.0 lb

## 2024-03-19 DIAGNOSIS — D500 Iron deficiency anemia secondary to blood loss (chronic): Secondary | ICD-10-CM | POA: Diagnosis not present

## 2024-03-19 NOTE — Assessment & Plan Note (Addendum)
 The most likely cause of her anemia is due to chronic blood loss. Lab Results  Component Value Date   IRON 61 03/05/2024   TIBC 316 03/05/2024   FERRITIN 80 03/05/2024    Last colonoscopy/endoscopy: 01/20/2024  Recently had colonoscopy and EGD which showed nonbleeding internal hemorrhoids with otherwise unremarkable colon.  EGD showed normal esophagus, multiple gastric polyps and normal antrum.  Pathology showed precancerous cells in the colon and a 5-year follow-up was recommended. Repeat labs from 03/05/2024 show hemoglobin of 12.4, ferritin 80 and iron saturation is 19%. Goal is to keep ferritin level greater than 50 and resolution of anemia Patient reports she felt better and started to feel like her ice cravings have increased over the past 2 weeks. She is currently not taking iron supplements because she tolerated them poorly in the past. Recommend 1 additional dose of IV Feraheme and follow-up in 10 weeks with virtual visit.

## 2024-03-19 NOTE — Progress Notes (Signed)
 "  Patient Care Team: Rakes, Rock HERO, FNP as PCP - General (Family Medicine) Steva, [facility], Dino HERO, MD as Physician Assistant (Neurosurgery) Dorrine Ditch, MD as Referring Physician (Physical Medicine and Rehabilitation)  DIAGNOSIS:  Encounter Diagnosis  Name Primary?   Iron deficiency anemia due to chronic blood loss Yes    CHIEF COMPLIANT:   HISTORY OF PRESENT ILLNESS:  History of Present Illness Vanessa [name] is a 49 year old female who presents for follow-up for iron deficiency anemia.  Patient was evaluated by Dr. Gudena and found to be iron deficient.  She received 2 doses of IV Feraheme in October 2025 with good tolerance.  Reports she felt significantly improved but has noticed a decline in her energy levels as well as increased ice cravings over the past 2 weeks.  Patient also had a colonoscopy/EGD in November/December 2025 which showed some internal hemorrhoids and a few polyps that were sent for pathology.  Results came back overall unremarkable with 1 precancerous polyp without cancer being identified.  She was instructed to return for repeat colonoscopy in 5 years.  She continues to have menstrual cycles fairly consistently every 28 days.  Reports they were previously heavy although they are not now.  Energy levels are still low.  Appetite is 50%.  Has headaches and numbness and tingling in her fingers.  Has an occasional migraine.  Denies any noticeable bright red blood per rectum, melena or hematochezia.   Reports she is unable to tolerate iron supplements secondary to poor tolerance with nausea.     ALLERGIES:  is allergic to iodinated contrast media, metrizamide, mushroom extract complex (obsolete), other, peanut oil, peanut-containing drug products, diflucan  in dextrose [fluconazole  in dextrose], and iodine-131.  MEDICATIONS:  Current Outpatient Medications  Medication Sig Dispense Refill   amitriptyline  (ELAVIL ) 50 MG tablet Take  1 tablet (50 mg total) by mouth daily. 90 tablet 1   atorvastatin  (LIPITOR) 80 MG tablet Take 1 tablet (80 mg total) by mouth daily. 90 tablet 1   Blood Glucose Monitoring Suppl (ONE TOUCH ULTRA 2) w/Device KIT Check BS 3 times a day Dx E11.65 1 kit 0   Continuous Glucose Sensor (FREESTYLE LIBRE 3 SENSOR) MISC Place 1 sensor on the skin every 14 days. Use to check glucose continuously 2 each 10   cyclobenzaprine  (FLEXERIL ) 10 MG tablet TAKE 1 TABLET BY MOUTH THREE TIMES A DAY AS NEEDED FOR MUSCLE SPASMS 60 tablet 2   dicyclomine (BENTYL) 20 MG tablet Take 20 mg by mouth 2 (two) times daily. (Patient not taking: Reported on 01/29/2024)     empagliflozin  (JARDIANCE ) 25 MG TABS tablet Take 1 tablet (25 mg total) by mouth daily before breakfast. 30 tablet 0   EPINEPHrine  (EPIPEN  2-PAK) 0.3 mg/0.3 mL IJ SOAJ injection Inject 0.3 mg into the muscle as needed for anaphylaxis. 2 each 1   furosemide  (LASIX ) 40 MG tablet Take 1 tablet (40 mg total) by mouth daily. 90 tablet 1   glucose blood (ACCU-CHEK GUIDE TEST) test strip Check BS 3 times a day Dx E11.65 300 strip 3   hydrOXYzine  (VISTARIL ) 25 MG capsule Take 1-2 capsules (25-50 mg total) by mouth at bedtime and may repeat dose one time if needed. 180 capsule 1   Lancets (ONETOUCH DELICA PLUS LANCET33G) MISC Check BS 3 times a day Dx E11.65 300 each 3   losartan  (COZAAR ) 100 MG tablet Take 1 tablet (100 mg total) by mouth daily. 90 tablet 3  metoprolol  succinate (TOPROL -XL) 25 MG 24 hr tablet Take 1 tablet (25 mg total) by mouth daily. 90 tablet 1   montelukast  (SINGULAIR ) 10 MG tablet Take 1 tablet (10 mg total) by mouth at bedtime. 90 tablet 1   omeprazole  (PRILOSEC) 40 MG capsule Take 1 capsule (40 mg total) by mouth daily. 90 capsule 3   Oxycodone  HCl 10 MG TABS Take 1 tablet (10 mg total) by mouth 2 (two) times daily as needed. 60 tablet 0   Oxycodone  HCl 10 MG TABS Take 1 tablet (10 mg total) by mouth 2 (two) times daily as needed. (Patient not  taking: Reported on 01/29/2024) 60 tablet 0   Oxycodone  HCl 10 MG TABS Take 1 tablet (10 mg total) by mouth 2 (two) times daily as needed. (Patient not taking: No sig reported) 60 tablet 0   promethazine -dextromethorphan  (PROMETHAZINE -DM) 6.25-15 MG/5ML syrup Take 5 mLs by mouth 4 (four) times daily as needed for cough. 118 mL 0   Rimegepant Sulfate (NURTEC) 75 MG TBDP Take 75 mg by mouth daily as needed. Onset migraine (max one daily) 10 tablet 11   Semaglutide , 2 MG/DOSE, (OZEMPIC , 2 MG/DOSE,) 8 MG/3ML SOPN INJECT 2MG  SUBCUTANEOUSLY AS DIRECTED ONCE A WEEK 9 mL 0   spironolactone  (ALDACTONE ) 100 MG tablet Take 1 tablet (100 mg total) by mouth 2 (two) times daily. 180 tablet 1   No current facility-administered medications for this visit.    PHYSICAL EXAMINATION: ECOG PERFORMANCE STATUS: 1 - Symptomatic but completely ambulatory  Vitals:   03/19/24 1132 03/19/24 1143  BP: (!) 146/71 133/71  Pulse: 78   Resp: 18   Temp: 98.9 F (37.2 C)   SpO2: 100%    Filed Weights   03/19/24 1132  Weight: (!) 332 lb (150.6 kg)    Physical Exam   (exam performed in the presence of a chaperone)  LABORATORY DATA:  I have reviewed the data as listed    Latest Ref Rng & Units 12/09/2023   12:17 PM 03/18/2023   11:45 AM 09/03/2022   12:57 PM  CMP  Glucose 70 - 99 mg/dL 84  880  877   BUN 6 - 24 mg/dL 11  14  8    Creatinine 0.57 - 1.00 mg/dL 9.11  9.12  9.19   Sodium 134 - 144 mmol/L 138  136  136   Potassium 3.5 - 5.2 mmol/L 4.2  4.2  3.9   Chloride 96 - 106 mmol/L 103  102  104   CO2 20 - 29 mmol/L 20  23  21    Calcium  8.7 - 10.2 mg/dL 8.8  8.9  8.6   Total Protein 6.0 - 8.5 g/dL  7.0  6.7   Total Bilirubin 0.0 - 1.2 mg/dL  0.4  0.2   Alkaline Phos 44 - 121 IU/L  61  62   AST 0 - 40 IU/L  14  14   ALT 0 - 32 IU/L  20  18     Lab Results  Component Value Date   WBC 6.9 03/05/2024   HGB 12.4 03/05/2024   HCT 40.1 03/05/2024   MCV 85.3 03/05/2024   PLT 188 03/05/2024   NEUTROABS 4.3  03/05/2024    ASSESSMENT & PLAN:  Microcytic anemia Lab review: 03/01/2022: Hemoglobin 9.7, MCV 71, RDW 18.6, platelets 255 09/03/2022: Hemoglobin 9.6, MCV 73, RDW 18.1 10/01/2023: Hemoglobin 9.7, MCV 74, RDW 17.6  Assessment & Plan Iron deficiency anemia due to chronic blood loss The most likely cause  of her anemia is due to chronic blood loss. Lab Results  Component Value Date   IRON 61 03/05/2024   TIBC 316 03/05/2024   FERRITIN 80 03/05/2024    Last colonoscopy/endoscopy: 01/20/2024  Recently had colonoscopy and EGD which showed nonbleeding internal hemorrhoids with otherwise unremarkable colon.  EGD showed normal esophagus, multiple gastric polyps and normal antrum.  Pathology showed precancerous cells in the colon and a 5-year follow-up was recommended. Repeat labs from 03/05/2024 show hemoglobin of 12.4, ferritin 80 and iron saturation is 19%. Goal is to keep ferritin level greater than 50 and resolution of anemia Patient reports she felt better and started to feel like her ice cravings have increased over the past 2 weeks. She is currently not taking iron supplements because she tolerated them poorly in the past. Recommend 1 additional dose of IV Feraheme and follow-up in 10 weeks with virtual visit.    Orders Placed This Encounter  Procedures   CBC with Differential    Standing Status:   Future    Expected Date:   05/28/2024    Expiration Date:   08/26/2024   Ferritin    Standing Status:   Future    Expected Date:   05/28/2024    Expiration Date:   08/26/2024   Iron and TIBC ([facility] ONLY)    Standing Status:   Future    Expected Date:   05/28/2024    Expiration Date:   08/26/2024   I spent 25 minutes dedicated to the care of this patient (face-to-face and non-face-to-face) on the date of the encounter to include what is described in the assessment and plan.  Delon Hope, AGNP-C Department of Hematology/Oncology [facility] at [facility]  Phone: [phone]  03/19/2024 11:51 AM    "

## 2024-03-25 ENCOUNTER — Other Ambulatory Visit: Payer: Self-pay | Admitting: Family Medicine

## 2024-03-25 DIAGNOSIS — J302 Other seasonal allergic rhinitis: Secondary | ICD-10-CM

## 2024-03-25 DIAGNOSIS — I152 Hypertension secondary to endocrine disorders: Secondary | ICD-10-CM

## 2024-03-25 NOTE — Telephone Encounter (Signed)
 Vanessa [name] NTBS was to have 8 wk FU from Oct 30-d given today

## 2024-03-25 NOTE — Telephone Encounter (Signed)
 Appt scheduled for 03/31/2024

## 2024-03-26 ENCOUNTER — Inpatient Hospital Stay

## 2024-03-26 VITALS — BP 150/80 | HR 103 | Temp 97.4°F | Resp 19

## 2024-03-26 DIAGNOSIS — D500 Iron deficiency anemia secondary to blood loss (chronic): Secondary | ICD-10-CM | POA: Diagnosis not present

## 2024-03-26 DIAGNOSIS — D509 Iron deficiency anemia, unspecified: Secondary | ICD-10-CM

## 2024-03-26 MED ORDER — ACETAMINOPHEN 325 MG PO TABS
650.0000 mg | ORAL_TABLET | Freq: Once | ORAL | Status: AC
  Administered 2024-03-26: 650 mg via ORAL
  Filled 2024-03-26: qty 2

## 2024-03-26 MED ORDER — SODIUM CHLORIDE 0.9 % IV SOLN: INTRAVENOUS | Status: DC

## 2024-03-26 MED ORDER — FAMOTIDINE IN NACL 20-0.9 MG/50ML-% IV SOLN
20.0000 mg | Freq: Once | INTRAVENOUS | Status: AC
  Administered 2024-03-26: 20 mg via INTRAVENOUS
  Filled 2024-03-26: qty 50

## 2024-03-26 MED ORDER — CETIRIZINE HCL 10 MG/ML IV SOLN
10.0000 mg | Freq: Once | INTRAVENOUS | Status: AC
  Administered 2024-03-26: 10 mg via INTRAVENOUS
  Filled 2024-03-26: qty 1

## 2024-03-26 MED ORDER — ONDANSETRON HCL 4 MG/2ML IJ SOLN
8.0000 mg | Freq: Once | INTRAMUSCULAR | Status: AC
  Administered 2024-03-26: 8 mg via INTRAVENOUS
  Filled 2024-03-26: qty 4

## 2024-03-26 MED ORDER — SODIUM CHLORIDE 0.9 % IV SOLN
510.0000 mg | Freq: Once | INTRAVENOUS | Status: AC
  Administered 2024-03-26: 510 mg via INTRAVENOUS
  Filled 2024-03-26: qty 510

## 2024-03-26 NOTE — Patient Instructions (Signed)
 [facility] [place] - A DEPT OF [place]  Discharge Instructions: Thank you for choosing [facility] to provide your oncology and hematology care.  If you have a lab appointment with the Cancer Center - please note that after April 8th, 2024, all labs will be drawn in the cancer center.  You do not have to check in or register with the main entrance as you have in the past but will complete your check-in in the cancer center.  Wear comfortable clothing and clothing appropriate for easy access to any Portacath or PICC line.   We strive to give you quality time with your provider. You may need to reschedule your appointment if you arrive late (15 or more minutes).  Arriving late affects you and other patients whose appointments are after yours.  Also, if you miss three or more appointments without notifying the office, you may be dismissed from the clinic at the providers discretion.      For prescription refill requests, have your pharmacy contact our office and allow 72 hours for refills to be completed.    Today you received the following Feraheme.  Ferumoxytol  Injection What is this medication? FERUMOXYTOL  (FER ue MOX i tol) treats low levels of iron in your body (iron deficiency anemia). Iron is a mineral that plays an important role in making red blood cells, which carry oxygen from your lungs to the rest of your body. This medicine may be used for other purposes; ask your health care provider or pharmacist if you have questions. COMMON BRAND NAME(S): Feraheme What should I tell my care team before I take this medication? They need to know if you have any of these conditions: Anemia not caused by low iron levels High levels of iron in the blood Magnetic resonance imaging (MRI) test scheduled An unusual or allergic reaction to iron, other medications, foods, dyes, or preservatives Pregnant or trying to get pregnant Breastfeeding How should I use  this medication? This medication is injected into a vein. It is given by your care team in a hospital or clinic setting. Talk to your care team the use of this medication in children. Special care may be needed. Overdosage: If you think you have taken too much of this medicine contact a poison control center or emergency room at once. NOTE: This medicine is only for you. Do not share this medicine with others. What if I miss a dose? It is important not to miss your dose. Call your care team if you are unable to keep an appointment. What may interact with this medication? Other iron products This list may not describe all possible interactions. Give your health care provider a list of all the medicines, herbs, non-prescription drugs, or dietary supplements you use. Also tell them if you smoke, drink alcohol, or use illegal drugs. Some items may interact with your medicine. What should I watch for while using this medication? Your condition will be monitored carefully while you are receiving this medication. Tell your care team if your symptoms do not start to get better or if they get worse. You may need blood work done while you are taking this medication. Sometimes, when medications are infused into veins, a little can leak out of the vein and into the tissue around it. If this medication leaks, it can cause a brown or dark stain on the skin. This is not common. It may be permanent. If you feel pain or swelling  during your infusion, tell your care team right away. They can stop the infusion and treat the area. You may need to eat more foods that contain iron. Talk to your care team. Foods that contain iron include whole grains or cereals, dried fruits, beans, peas, leafy green vegetables, and organ meats (liver, kidney). What side effects may I notice from receiving this medication? Side effects that you should report to your care team as soon as possible: Allergic reactions--skin rash, itching,  hives, swelling of the face, lips, tongue, or throat Low blood pressure--dizziness, feeling faint or lightheaded, blurry vision Painful swelling, warmth, or redness of the skin, brown or dark skin color at the infusion site Shortness of breath Side effects that usually do not require medical attention (report these to your care team if they continue or are bothersome): Flushing Headache Joint pain Muscle pain Nausea This list may not describe all possible side effects. Call your doctor for medical advice about side effects. You may report side effects to FDA at 1-800-FDA-1088. Where should I keep my medication? This medication is given in a hospital or clinic. It will not be stored at home. NOTE: This sheet is a summary. It may not cover all possible information. If you have questions about this medicine, talk to your doctor, pharmacist, or health care provider.  2025 Elsevier/Gold Standard (2023-12-31 00:00:00)   To help prevent nausea and vomiting after your treatment, we encourage you to take your nausea medication as directed.  BELOW ARE SYMPTOMS THAT SHOULD BE REPORTED IMMEDIATELY: *FEVER GREATER THAN 100.4 F (38 C) OR HIGHER *CHILLS OR SWEATING *NAUSEA AND VOMITING THAT IS NOT CONTROLLED WITH YOUR NAUSEA MEDICATION *UNUSUAL SHORTNESS OF BREATH *UNUSUAL BRUISING OR BLEEDING *URINARY PROBLEMS (pain or burning when urinating, or frequent urination) *BOWEL PROBLEMS (unusual diarrhea, constipation, pain near the anus) TENDERNESS IN MOUTH AND THROAT WITH OR WITHOUT PRESENCE OF ULCERS (sore throat, sores in mouth, or a toothache) UNUSUAL RASH, SWELLING OR PAIN  UNUSUAL VAGINAL DISCHARGE OR ITCHING   Items with * indicate a potential emergency and should be followed up as soon as possible or go to the Emergency Department if any problems should occur.  Please show the CHEMOTHERAPY ALERT CARD or IMMUNOTHERAPY ALERT CARD at check-in to the Emergency Department and triage  nurse.  Should you have questions after your visit or need to cancel or reschedule your appointment, please contact [facility] [place] - A DEPT OF [name] [phone]  and follow the prompts.  Office hours are 8:00 a.m. to 4:30 p.m. Monday - Friday. Please note that voicemails left after 4:00 p.m. may not be returned until the following business day.  We are closed weekends and major holidays. You have access to a nurse at all times for urgent questions. Please call the main number to the clinic [phone] and follow the prompts.  For any non-urgent questions, you may also contact your provider using MyChart. We now offer e-Visits for anyone 51 and older to request care online for non-urgent symptoms. For details visit [email or web address].   Also download the MyChart app! Go to the app store, search MyChart, open the app, select [place], and log in with your MyChart username and password.

## 2024-03-26 NOTE — Progress Notes (Signed)
 Patient tolerated iron infusion with no complaints voiced.  Peripheral IV site clean and dry with good blood return noted before and after infusion.  Band aid applied.  VSS with discharge and left in satisfactory condition with no s/s of distress noted.

## 2024-03-29 ENCOUNTER — Other Ambulatory Visit: Payer: Self-pay | Admitting: Family Medicine

## 2024-03-29 DIAGNOSIS — G43709 Chronic migraine without aura, not intractable, without status migrainosus: Secondary | ICD-10-CM

## 2024-03-31 ENCOUNTER — Ambulatory Visit: Payer: Self-pay | Admitting: Family Medicine

## 2024-03-31 ENCOUNTER — Other Ambulatory Visit: Payer: Self-pay | Admitting: Family Medicine

## 2024-03-31 ENCOUNTER — Ambulatory Visit: Admitting: Family Medicine

## 2024-03-31 ENCOUNTER — Encounter: Payer: Self-pay | Admitting: Family Medicine

## 2024-03-31 VITALS — BP 128/72 | HR 82 | Temp 97.2°F | Ht 70.0 in | Wt 329.0 lb

## 2024-03-31 DIAGNOSIS — Z1231 Encounter for screening mammogram for malignant neoplasm of breast: Secondary | ICD-10-CM

## 2024-03-31 DIAGNOSIS — E1169 Type 2 diabetes mellitus with other specified complication: Secondary | ICD-10-CM | POA: Diagnosis not present

## 2024-03-31 DIAGNOSIS — J069 Acute upper respiratory infection, unspecified: Secondary | ICD-10-CM | POA: Diagnosis not present

## 2024-03-31 DIAGNOSIS — Z6841 Body Mass Index (BMI) 40.0 and over, adult: Secondary | ICD-10-CM

## 2024-03-31 DIAGNOSIS — F5101 Primary insomnia: Secondary | ICD-10-CM

## 2024-03-31 DIAGNOSIS — E1159 Type 2 diabetes mellitus with other circulatory complications: Secondary | ICD-10-CM

## 2024-03-31 DIAGNOSIS — I152 Hypertension secondary to endocrine disorders: Secondary | ICD-10-CM | POA: Diagnosis not present

## 2024-03-31 DIAGNOSIS — Z91018 Allergy to other foods: Secondary | ICD-10-CM

## 2024-03-31 DIAGNOSIS — Z7984 Long term (current) use of oral hypoglycemic drugs: Secondary | ICD-10-CM

## 2024-03-31 DIAGNOSIS — Z982 Presence of cerebrospinal fluid drainage device: Secondary | ICD-10-CM

## 2024-03-31 DIAGNOSIS — I872 Venous insufficiency (chronic) (peripheral): Secondary | ICD-10-CM

## 2024-03-31 DIAGNOSIS — E1165 Type 2 diabetes mellitus with hyperglycemia: Secondary | ICD-10-CM | POA: Diagnosis not present

## 2024-03-31 DIAGNOSIS — R809 Proteinuria, unspecified: Secondary | ICD-10-CM

## 2024-03-31 DIAGNOSIS — G932 Benign intracranial hypertension: Secondary | ICD-10-CM

## 2024-03-31 LAB — BAYER DCA HB A1C WAIVED: HB A1C (BAYER DCA - WAIVED): 6.3 % — ABNORMAL HIGH (ref 4.8–5.6)

## 2024-03-31 MED ORDER — HYDROXYZINE PAMOATE 25 MG PO CAPS: 25.0000 mg | ORAL_CAPSULE | Freq: Every evening | ORAL | 1 refills | Status: AC | PRN

## 2024-03-31 MED ORDER — EMPAGLIFLOZIN 25 MG PO TABS: 25.0000 mg | ORAL_TABLET | Freq: Every day | ORAL | 1 refills | Status: AC

## 2024-03-31 MED ORDER — EPINEPHRINE 0.3 MG/0.3ML IJ SOAJ: 0.3000 mg | INTRAMUSCULAR | 1 refills | Status: AC | PRN

## 2024-03-31 MED ORDER — SPIRONOLACTONE 100 MG PO TABS: 100.0000 mg | ORAL_TABLET | Freq: Two times a day (BID) | ORAL | 1 refills | Status: AC

## 2024-03-31 MED ORDER — METOPROLOL SUCCINATE ER 25 MG PO TB24: 25.0000 mg | ORAL_TABLET | Freq: Every day | ORAL | 1 refills | Status: AC

## 2024-03-31 MED ORDER — FUROSEMIDE 40 MG PO TABS: 40.0000 mg | ORAL_TABLET | Freq: Every day | ORAL | 1 refills | Status: AC

## 2024-03-31 MED ORDER — PROMETHAZINE-DM 6.25-15 MG/5ML PO SYRP: 5.0000 mL | ORAL_SOLUTION | Freq: Four times a day (QID) | ORAL | 0 refills | Status: AC | PRN

## 2024-03-31 MED ORDER — AZITHROMYCIN 500 MG PO TABS: 500.0000 mg | ORAL_TABLET | Freq: Every day | ORAL | 0 refills | Status: AC

## 2024-03-31 MED ORDER — ATORVASTATIN CALCIUM 80 MG PO TABS: 80.0000 mg | ORAL_TABLET | Freq: Every day | ORAL | 1 refills | Status: AC

## 2024-03-31 NOTE — Patient Instructions (Addendum)

## 2024-03-31 NOTE — Progress Notes (Signed)
 "    Subjective:  Patient ID: Vanessa [name], female    DOB: [birth year], 49 y.o.   MRN: [id number]  Patient Care Team: Severa Rock HERO, FNP as PCP - General (Family [facility]) [facility], [facility], Dino HERO, MD as Physician Assistant (Neurosurgery) Dorrine Ditch, MD (Inactive) as Referring Physician (Physical Medicine and Rehabilitation)   Chief Complaint:  Medical Management of Chronic Issues, Cough, and Nasal Congestion (X 3 days ago )   HPI: Vanessa [name] is a 49 y.o. female presenting on 03/31/2024 for Medical Management of Chronic Issues, Cough, and Nasal Congestion (X 3 days ago )   Vanessa [name] is a 49 year old female with diabetes, hypertension, and hyperlipidemia who presents for follow-up and evaluation of bronchitis symptoms.  She has experienced bronchitis symptoms for the past ten years, typically with seasonal changes. Symptoms began three days ago with sporadic coughing that became constant, accompanied by chest congestion and a persistent cough causing headaches. She has been using leftover cough syrup and Mucinex , and drinks plenty of water. No fever, chills, significant shortness of breath, or fatigue, but she notes difficulty sleeping due to the need to sit up to alleviate coughing.  Her diabetes is managed with Ozempic  2 mg and Jardiance  25 mg. Blood sugar levels range from 100 to 160 mg/dL, with no recent readings in the 250s or 260s except after cortisone injections. She experiences vaginal dryness and itching with Jardiance  use, managed with cooling wipes, and denies any discharge. She regularly checks her blood sugar and maintains hydration.  For hypertension, she takes losartan , metoprolol , and furosemide , with blood pressure readings between 120 and 145 mmHg. No significant changes in blood pressure, headaches beyond her usual pseudotumor cerebri symptoms, or chest pain. She reports that one leg has always been a little bigger since a previous  hospitalization, and she has noticed some swelling in that leg.  Her hyperlipidemia is managed with atorvastatin , with no reported muscle aches or pains.  She has a history of alpha-gal allergy, which recently caused a reaction after consuming a regular Whopper instead of an [employer], resulting in hives and a racing heart. She used an EpiPen  and Benadryl , which resolved the symptoms. She is cautious about food and medication ingredients due to this allergy.          Relevant past medical, surgical, family, and social history reviewed and updated as indicated.  Allergies and medications reviewed and updated. Data reviewed: Chart in Epic.   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   Hypertension    Microcytic anemia 11/28/2023   Migraines    Pseudotumor (inflammatory) of orbit    Sepsis (HCC) 02/2019   Sleep apnea    Vaginal Pap smear, abnormal    Vision loss     Past Surgical History:  Procedure Laterality Date   arm surgery Left 08/20/2022   BRAIN SURGERY     shunt on right with removal and reinserted on left   COLONOSCOPY N/A 01/20/2024   Procedure: COLONOSCOPY;  Surgeon: Eartha Angelia Sieving, MD;  [facility];  Service: Gastroenterology;  Laterality: N/A;  8:15am, ASA 3   COLONOSCOPY N/A 02/10/2024   Procedure: COLONOSCOPY;  Surgeon: Eartha Angelia Sieving, MD;  [facility];  Service: Gastroenterology;  Laterality: N/A;  11:00am, ASA 3- room 1 ok   CSF SHUNT     lower back   ESOPHAGOGASTRODUODENOSCOPY N/A 01/20/2024   Procedure:  EGD (ESOPHAGOGASTRODUODENOSCOPY);  Surgeon: Eartha Flavors, Toribio, MD;  [facility];  Service: Gastroenterology;  Laterality: N/A;   GIVENS CAPSULE STUDY N/A 01/28/2024   Procedure: IMAGING PROCEDURE, GI TRACT, INTRALUMINAL, VIA CAPSULE;  Surgeon: Eartha Flavors, Toribio, MD;  [facility];  Service: Gastroenterology;  Laterality: N/A;   7:00am Givens Capsule Endoscopy   SCLEROTHERAPY  02/10/2024   Procedure: SCLEROTHERAPY;  Surgeon: Eartha Flavors, Toribio, MD;  Location: [facility];  Service: Gastroenterology;;  tattoo at small bowel   TRACHEOSTOMY      Social History   Socioeconomic History   Marital status: Single    Spouse name: Not on file   Number of children: 0   Years of education: Not on file   Highest education level: Some college, no degree  Occupational History   Occupation: disability  Tobacco Use   Smoking status: Never   Smokeless tobacco: Never  Vaping Use   Vaping status: Never Used  Substance and Sexual Activity   Alcohol use: No   Drug use: No   Sexual activity: Not Currently    Birth control/protection: Condom  Other Topics Concern   Not on file  Social History Narrative   Lives with parents   Right handed   Caffeine: maybe 2 cups/day   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   Social Drivers of Health   Tobacco Use: Low Risk (03/31/2024)   Patient History    Smoking Tobacco Use: Never    Smokeless Tobacco Use: Never    Passive Exposure: Not on file  Financial Resource Strain: Medium Risk (12/09/2023)   Overall Financial Resource Strain (CARDIA)    Difficulty of Paying Living Expenses: Somewhat hard  Food Insecurity: No Food Insecurity (01/29/2024)   Epic    Worried About Programme Researcher, Broadcasting/film/video in the Last Year: Never true    Ran Out of Food in the Last Year: Never true  Recent Concern: Food Insecurity - Food Insecurity Present (12/09/2023)   Epic    Worried About Programme Researcher, Broadcasting/film/video in the Last Year: Never true    Ran Out of Food in the Last Year: Sometimes true  Transportation Needs: No Transportation Needs (01/29/2024)   Epic    Lack of Transportation (Medical): No    Lack of Transportation (Non-Medical): No  Physical Activity: Insufficiently Active (01/29/2024)   Exercise Vital Sign    Days of Exercise per Week: 3 days    Minutes of Exercise per Session: 20 min  Stress: Stress  Concern Present (01/29/2024)   [employer] of Occupational Health - Occupational Stress Questionnaire    Feeling of Stress: To some extent  Social Connections: Moderately Integrated (01/29/2024)   Social Connection and Isolation Panel    Frequency of Communication with Friends and Family: More than three times a week    Frequency of Social Gatherings with Friends and Family: More than three times a week    Attends Religious Services: More than 4 times per year    Active Member of Clubs or Organizations: Yes    Attends Banker Meetings: 1 to 4 times per year    Marital Status: Never married  Intimate Partner Violence: Not At Risk (01/29/2024)   Epic    Fear of Current or Ex-Partner: No    Emotionally Abused: No    Physically Abused: No    Sexually Abused: No  Depression (PHQ2-9): Low Risk (03/31/2024)   Depression (PHQ2-9)    PHQ-2 Score: 0  Alcohol Screen: Low  Risk (01/28/2023)   Alcohol Screen    Last Alcohol Screening Score (AUDIT): 0  Housing: Low Risk (01/29/2024)   Epic    Unable to Pay for Housing in the Last Year: No    Number of Times Moved in the Last Year: 0    Homeless in the Last Year: No  Utilities: Not At Risk (01/29/2024)   Epic    Threatened with loss of utilities: No  Health Literacy: Adequate Health Literacy (01/28/2023)   B1300 Health Literacy    Frequency of need for help with medical instructions: Never    Outpatient Encounter Medications as of 03/31/2024  Medication Sig   amitriptyline  (ELAVIL ) 50 MG tablet TAKE 1 TABLET BY MOUTH EVERY DAY   azithromycin  (ZITHROMAX ) 500 MG tablet Take 1 tablet (500 mg total) by mouth daily for 3 days.   Blood Glucose Monitoring Suppl (ONE TOUCH ULTRA 2) w/Device KIT Check BS 3 times a day Dx E11.65   Continuous Glucose Sensor (FREESTYLE LIBRE 3 SENSOR) MISC Place 1 sensor on the skin every 14 days. Use to check glucose continuously   cyclobenzaprine  (FLEXERIL ) 10 MG tablet TAKE 1 TABLET BY MOUTH THREE TIMES A  DAY AS NEEDED FOR MUSCLE SPASMS   glucose blood (ACCU-CHEK GUIDE TEST) test strip Check BS 3 times a day Dx E11.65   Lancets (ONETOUCH DELICA PLUS LANCET33G) MISC Check BS 3 times a day Dx E11.65   losartan  (COZAAR ) 100 MG tablet Take 1 tablet (100 mg total) by mouth daily.   montelukast  (SINGULAIR ) 10 MG tablet TAKE 1 TABLET BY MOUTH EVERYDAY AT BEDTIME   omeprazole  (PRILOSEC) 40 MG capsule Take 1 capsule (40 mg total) by mouth daily.   Oxycodone  HCl 10 MG TABS Take 1 tablet (10 mg total) by mouth 2 (two) times daily as needed.   Oxycodone  HCl 10 MG TABS Take 1 tablet (10 mg total) by mouth 2 (two) times daily as needed.   Oxycodone  HCl 10 MG TABS Take 1 tablet (10 mg total) by mouth 2 (two) times daily as needed.   promethazine -dextromethorphan  (PROMETHAZINE -DM) 6.25-15 MG/5ML syrup Take 5 mLs by mouth 4 (four) times daily as needed for cough.   Rimegepant Sulfate (NURTEC) 75 MG TBDP Take 75 mg by mouth daily as needed. Onset migraine (max one daily)   Semaglutide , 2 MG/DOSE, (OZEMPIC , 2 MG/DOSE,) 8 MG/3ML SOPN INJECT 2MG  SUBCUTANEOUSLY AS DIRECTED ONCE A WEEK   [DISCONTINUED] EPINEPHrine  (EPIPEN  2-PAK) 0.3 mg/0.3 mL IJ SOAJ injection Inject 0.3 mg into the muscle as needed for anaphylaxis.   atorvastatin  (LIPITOR) 80 MG tablet Take 1 tablet (80 mg total) by mouth daily.   empagliflozin  (JARDIANCE ) 25 MG TABS tablet Take 1 tablet (25 mg total) by mouth daily before breakfast.   EPINEPHrine  (EPIPEN  2-PAK) 0.3 mg/0.3 mL IJ SOAJ injection Inject 0.3 mg into the muscle as needed for anaphylaxis.   furosemide  (LASIX ) 40 MG tablet Take 1 tablet (40 mg total) by mouth daily.   hydrOXYzine  (VISTARIL ) 25 MG capsule Take 1-2 capsules (25-50 mg total) by mouth at bedtime and may repeat dose one time if needed.   metoprolol  succinate (TOPROL -XL) 25 MG 24 hr tablet Take 1 tablet (25 mg total) by mouth daily.   spironolactone  (ALDACTONE ) 100 MG tablet Take 1 tablet (100 mg total) by mouth 2 (two) times daily.    [DISCONTINUED] atorvastatin  (LIPITOR) 80 MG tablet Take 1 tablet (80 mg total) by mouth daily.   [DISCONTINUED] dicyclomine (BENTYL) 20 MG tablet Take 20 mg by  mouth 2 (two) times daily. (Patient not taking: Reported on 01/29/2024)   [DISCONTINUED] empagliflozin  (JARDIANCE ) 25 MG TABS tablet Take 1 tablet (25 mg total) by mouth daily before breakfast.   [DISCONTINUED] furosemide  (LASIX ) 40 MG tablet Take 1 tablet (40 mg total) by mouth daily. **NEEDS TO BE SEEN BEFORE NEXT REFILL**   [DISCONTINUED] hydrOXYzine  (VISTARIL ) 25 MG capsule Take 1-2 capsules (25-50 mg total) by mouth at bedtime and may repeat dose one time if needed.   [DISCONTINUED] metoprolol  succinate (TOPROL -XL) 25 MG 24 hr tablet Take 1 tablet (25 mg total) by mouth daily.   [DISCONTINUED] promethazine -dextromethorphan  (PROMETHAZINE -DM) 6.25-15 MG/5ML syrup Take 5 mLs by mouth 4 (four) times daily as needed for cough.   [DISCONTINUED] spironolactone  (ALDACTONE ) 100 MG tablet Take 1 tablet (100 mg total) by mouth 2 (two) times daily.   No facility-administered encounter medications on file as of 03/31/2024.    Allergies[1]  Pertinent ROS per HPI, otherwise unremarkable      Objective:  BP 128/72   Pulse 82   Temp (!) 97.2 F (36.2 C)   Ht 5' 10 (1.778 m)   Wt (!) 329 lb (149.2 kg)   SpO2 98%   BMI 47.21 kg/m    Wt Readings from Last 3 Encounters:  03/31/24 (!) 329 lb (149.2 kg)  03/19/24 (!) 332 lb (150.6 kg)  02/10/24 (!) 337 lb (152.9 kg)    Physical Exam Vitals and nursing note reviewed.  Constitutional:      General: She is not in acute distress.    Appearance: Normal appearance. She is morbidly obese. She is not ill-appearing, toxic-appearing or diaphoretic.  HENT:     Head: Normocephalic and atraumatic.     Right Ear: Hearing, tympanic membrane, ear canal and external ear normal.     Left Ear: Hearing, tympanic membrane, ear canal and external ear normal.     Nose: No congestion or rhinorrhea.      Right Sinus: No maxillary sinus tenderness or frontal sinus tenderness.     Left Sinus: No maxillary sinus tenderness or frontal sinus tenderness.     Mouth/Throat:     Lips: Pink.     Mouth: Mucous membranes are moist.     Pharynx: Posterior oropharyngeal erythema and postnasal drip present. No oropharyngeal exudate.  Eyes:     Conjunctiva/sclera: Conjunctivae normal.     Comments: Exotropia of right eye, blind in right eye    Cardiovascular:     Rate and Rhythm: Normal rate and regular rhythm.     Heart sounds: Normal heart sounds.     Comments: Chronic venous statis changes to bilateral lower extremities, venous statis dermatitis of left lower leg, no cellulitis Pulmonary:     Effort: Pulmonary effort is normal.     Breath sounds: Wheezing (mild expiratory) present.  Musculoskeletal:     Cervical back: Neck supple.     Right lower leg: 2+ Edema present.     Left lower leg: 2+ Edema present.  Skin:    General: Skin is warm and dry.     Capillary Refill: Capillary refill takes less than 2 seconds.  Neurological:     General: No focal deficit present.     Mental Status: She is alert and oriented to person, place, and time.  Psychiatric:        Mood and Affect: Mood normal.        Behavior: Behavior normal. Behavior is cooperative.        Thought Content: Thought  content normal.        Judgment: Judgment normal.     Results for orders placed or performed in visit on 03/05/24  Iron and TIBC ([facility] ONLY)   Collection Time: 03/05/24 10:27 AM  Result Value Ref Range   Iron 61 28 - 170 ug/dL   TIBC 683 749 - 549 ug/dL   Saturation Ratios 19 10.4 - 31.8 %   UIBC 255 ug/dL  Ferritin   Collection Time: 03/05/24 10:27 AM  Result Value Ref Range   Ferritin 80 11 - 307 ng/mL  CBC with Differential   Collection Time: 03/05/24 10:28 AM  Result Value Ref Range   WBC 6.9 4.0 - 10.5 K/uL   RBC 4.70 3.87 - 5.11 MIL/uL   Hemoglobin 12.4 12.0 - 15.0 g/dL   HCT  59.8 63.9 - 53.9 %   MCV 85.3 80.0 - 100.0 fL   MCH 26.4 26.0 - 34.0 pg   MCHC 30.9 30.0 - 36.0 g/dL   RDW 82.3 (H) 88.4 - 84.4 %   Platelets 188 150 - 400 K/uL   nRBC 0.0 0.0 - 0.2 %   Neutrophils Relative % 62 %   Neutro Abs 4.3 1.7 - 7.7 K/uL   Lymphocytes Relative 29 %   Lymphs Abs 2.0 0.7 - 4.0 K/uL   Monocytes Relative 7 %   Monocytes Absolute 0.5 0.1 - 1.0 K/uL   Eosinophils Relative 2 %   Eosinophils Absolute 0.1 0.0 - 0.5 K/uL   Basophils Relative 0 %   Basophils Absolute 0.0 0.0 - 0.1 K/uL   Immature Granulocytes 0 %   Abs Immature Granulocytes 0.02 0.00 - 0.07 K/uL       Pertinent labs & imaging results that were available during my care of the patient were reviewed by me and considered in my medical decision making.  Assessment & Plan:  Vanessa [name] was seen today for medical management of chronic issues, cough and nasal congestion.  Diagnoses and all orders for this visit:  Type 2 diabetes mellitus with hyperglycemia, without long-term current use of insulin (HCC) -     Bayer DCA Hb A1c Waived -     atorvastatin  (LIPITOR) 80 MG tablet; Take 1 tablet (80 mg total) by mouth daily. -     empagliflozin  (JARDIANCE ) 25 MG TABS tablet; Take 1 tablet (25 mg total) by mouth daily before breakfast. -     Microalbumin / creatinine urine ratio  Hyperlipidemia associated with type 2 diabetes mellitus (HCC) -     Bayer DCA Hb A1c Waived -     atorvastatin  (LIPITOR) 80 MG tablet; Take 1 tablet (80 mg total) by mouth daily.  Morbid obesity with BMI of 45.0-49.9, adult (HCC) -     Bayer DCA Hb A1c Waived  Hypertension associated with type 2 diabetes mellitus (HCC) -     Bayer DCA Hb A1c Waived -     furosemide  (LASIX ) 40 MG tablet; Take 1 tablet (40 mg total) by mouth daily. -     metoprolol  succinate (TOPROL -XL) 25 MG 24 hr tablet; Take 1 tablet (25 mg total) by mouth daily. -     spironolactone  (ALDACTONE ) 100 MG tablet; Take 1 tablet (100 mg total) by mouth 2 (two) times  daily.  Primary insomnia -     hydrOXYzine  (VISTARIL ) 25 MG capsule; Take 1-2 capsules (25-50 mg total) by mouth at bedtime and may repeat dose one time if needed.  IIH (idiopathic intracranial hypertension) -  spironolactone  (ALDACTONE ) 100 MG tablet; Take 1 tablet (100 mg total) by mouth 2 (two) times daily.  Pseudotumor cerebri -     furosemide  (LASIX ) 40 MG tablet; Take 1 tablet (40 mg total) by mouth daily. -     spironolactone  (ALDACTONE ) 100 MG tablet; Take 1 tablet (100 mg total) by mouth 2 (two) times daily.  S/P ventriculoperitoneal shunt -     furosemide  (LASIX ) 40 MG tablet; Take 1 tablet (40 mg total) by mouth daily. -     spironolactone  (ALDACTONE ) 100 MG tablet; Take 1 tablet (100 mg total) by mouth 2 (two) times daily.  Venous stasis dermatitis -     furosemide  (LASIX ) 40 MG tablet; Take 1 tablet (40 mg total) by mouth daily. -     spironolactone  (ALDACTONE ) 100 MG tablet; Take 1 tablet (100 mg total) by mouth 2 (two) times daily.  URI with cough and congestion -     promethazine -dextromethorphan  (PROMETHAZINE -DM) 6.25-15 MG/5ML syrup; Take 5 mLs by mouth 4 (four) times daily as needed for cough. -     azithromycin  (ZITHROMAX ) 500 MG tablet; Take 1 tablet (500 mg total) by mouth daily for 3 days.  Encounter for screening mammogram for malignant neoplasm of breast -     MM DIGITAL SCREENING BILATERAL; Future  Allergy to alpha-gal -     EPINEPHrine  (EPIPEN  2-PAK) 0.3 mg/0.3 mL IJ SOAJ injection; Inject 0.3 mg into the muscle as needed for anaphylaxis.       Acute upper respiratory infection Symptoms consistent with acute upper respiratory infection, including cough, congestion, and headache, persisting for three days. No fever, chills, or significant shortness of breath. Cough disrupts sleep, requiring sitting up to rest. - Prescribed promethazine  DM for nighttime cough - Recommended Delsym  for daytime cough - Prescribed Zithromax  500 mg for three days  Type 2  diabetes mellitus Well-controlled with current medication regimen. Blood glucose levels range from 100 to 160 mg/dL. A1c is 6.3%, indicating good glycemic control. No significant side effects from Jardiance , though mild vaginal dryness and itchiness are present. - Continue current medications: Ozempic  and Jardiance  - Monitor blood glucose levels regularly - Ordered A1c test  Idiopathic intracranial hypertension Managed with fluid pills. No significant changes in symptoms or blood pressure. Blood pressure is well-controlled with current regimen. - Continue current medications: losartan , metoprolol , furosemide , and spironolactone   Venous stasis dermatitis Leg swelling and hardening, likely due to slow venous return. No significant redness, inflammation, or drainage. - Apply moisturizers like Cerave or Cetaphil multiple times a day - Use compression socks to the knee  Allergy to alpha-gal Recent reaction to regular Whopper causing hives and tachycardia, managed with EpiPen  and Benadryl . No hospital visit required as symptoms resolved. Importance of avoiding alpha-gal containing foods and medications discussed. - Prescribed EpiPen  refills - Advised to avoid alpha-gal containing foods and medications - Instructed to inform pharmacy of alpha-gal allergy for medication review  General Health Maintenance Routine health maintenance is up to date. Recent eye exam scheduled for end of March. Mammogram ordered for breast cancer screening. - Ordered mammogram - Ensure diabetic eye exam is performed and results sent to provider - Obtained urine sample for microalbumin test          Continue all other maintenance medications.  Follow up plan: Return in about 3 months (around 06/28/2024) for DM.   Continue healthy lifestyle choices, including diet (rich in fruits, vegetables, and lean proteins, and low in salt and simple carbohydrates) and exercise (at least 30  minutes of moderate physical  activity daily).  Educational handout given for DM  The above assessment and management plan was discussed with the patient. The patient verbalized understanding of and has agreed to the management plan. Patient is aware to call the clinic if they develop any new symptoms or if symptoms persist or worsen. Patient is aware when to return to the clinic for a follow-up visit. Patient educated on when it is appropriate to go to the emergency department.   Vanessa Bruns, FNP-C [facility] [phone]     [1]  Allergies Allergen Reactions   Iodinated Contrast Media Anaphylaxis, Cough, Itching, Other (See Comments), Photosensitivity, Shortness Of Breath and Swelling    Migraine instantly  update, Migraine instantly  update    update update update  update Migraine instantly    update Migraine instantly update Migraine instantly  Migraine instantly    Migraine instantly  update, Migraine instantly  update  update update update  update Migraine instantly  update Migraine instantly update Migraine instantly    update, Migraine instantly    update Migraine instantly  Topical OK per patient  Providone-Iodine 10% Solution used for skin prep   Metrizamide Other (See Comments), Itching, Photosensitivity, Shortness Of Breath and Swelling    update Migraine instantly   Mushroom Extract Complex (Obsolete) Itching    Throat itching with cough   Other Itching    Throat itching with cough   Peanut Oil Anaphylaxis and Other (See Comments)    update   Peanut-Containing Drug Products Anaphylaxis, Itching and Other (See Comments)    Itching throat with cough  update  Itching throat with cough    Itching throat with cough  update    Update  States no longer allergic   Diflucan  In Dextrose [Fluconazole  In Dextrose] Hives   Iodine-131 Other (See Comments)    update   "

## 2024-04-01 ENCOUNTER — Ambulatory Visit (INDEPENDENT_AMBULATORY_CARE_PROVIDER_SITE_OTHER): Admitting: Gastroenterology

## 2024-04-01 LAB — MICROALBUMIN / CREATININE URINE RATIO
Creatinine, Urine: 260.3 mg/dL
Microalb/Creat Ratio: 17 mg/g{creat} (ref 0–29)
Microalbumin, Urine: 43.3 ug/mL

## 2024-04-02 NOTE — Telephone Encounter (Signed)
 Patient aware and verbalizes understanding. Referral placed and patient aware

## 2024-04-21 ENCOUNTER — Encounter

## 2024-05-20 ENCOUNTER — Ambulatory Visit (INDEPENDENT_AMBULATORY_CARE_PROVIDER_SITE_OTHER): Admitting: Gastroenterology

## 2024-05-24 ENCOUNTER — Inpatient Hospital Stay

## 2024-05-31 ENCOUNTER — Inpatient Hospital Stay: Admitting: Oncology

## 2024-06-30 ENCOUNTER — Ambulatory Visit: Admitting: Family Medicine

## 2025-02-01 ENCOUNTER — Ambulatory Visit
